# Patient Record
Sex: Male | Born: 1955 | Race: White | Hispanic: No
Health system: Western US, Academic
[De-identification: ages and names within clinical notes are randomized; demographics above are authoritative.]

## PROBLEM LIST (undated history)

## (undated) DIAGNOSIS — E119 Type 2 diabetes mellitus without complications: Secondary | ICD-10-CM

---

## 2009-01-30 ENCOUNTER — Emergency Department (HOSPITAL_COMMUNITY): Admission: EM | Admit: 2009-01-30 | Discharge: 2009-01-30 | Payer: Self-pay | Admitting: Emergency Medicine

## 2009-07-15 ENCOUNTER — Emergency Department (HOSPITAL_COMMUNITY): Admission: EM | Admit: 2009-07-15 | Discharge: 2009-07-15 | Payer: Self-pay | Admitting: Emergency Medicine

## 2009-12-15 ENCOUNTER — Emergency Department (HOSPITAL_BASED_OUTPATIENT_CLINIC_OR_DEPARTMENT_OTHER)
Admission: EM | Admit: 2009-12-15 | Discharge: 2009-12-15 | Disposition: A | Discharge status: Home / Self Care | Payer: Medicare Other | Attending: Emergency Medicine | Admitting: Emergency Medicine

## 2009-12-15 DIAGNOSIS — I10 Essential (primary) hypertension: Secondary | ICD-10-CM | POA: Insufficient documentation

## 2009-12-15 DIAGNOSIS — M25559 Pain in unspecified hip: Secondary | ICD-10-CM | POA: Insufficient documentation

## 2009-12-15 DIAGNOSIS — Z5900 Homelessness unspecified: Secondary | ICD-10-CM | POA: Insufficient documentation

## 2009-12-15 DIAGNOSIS — R042 Hemoptysis: Secondary | ICD-10-CM | POA: Insufficient documentation

## 2009-12-15 DIAGNOSIS — Z85819 Personal history of malignant neoplasm of unspecified site of lip, oral cavity, and pharynx: Secondary | ICD-10-CM | POA: Insufficient documentation

## 2009-12-15 DIAGNOSIS — R070 Pain in throat: Secondary | ICD-10-CM | POA: Insufficient documentation

## 2009-12-15 DIAGNOSIS — R5383 Other fatigue: Secondary | ICD-10-CM | POA: Insufficient documentation

## 2009-12-15 DIAGNOSIS — B1920 Unspecified viral hepatitis C without hepatic coma: Secondary | ICD-10-CM | POA: Insufficient documentation

## 2009-12-15 DIAGNOSIS — R5381 Other malaise: Secondary | ICD-10-CM | POA: Insufficient documentation

## 2009-12-21 ENCOUNTER — Ambulatory Visit (HOSPITAL_BASED_OUTPATIENT_CLINIC_OR_DEPARTMENT_OTHER): Payer: Medicaid Other | Attending: Facial Plastic Surgery | Admitting: Facial Plastic Surgery

## 2009-12-21 DIAGNOSIS — R634 Abnormal weight loss: Secondary | ICD-10-CM | POA: Insufficient documentation

## 2009-12-21 DIAGNOSIS — Z85819 Personal history of malignant neoplasm of unspecified site of lip, oral cavity, and pharynx: Secondary | ICD-10-CM | POA: Insufficient documentation

## 2009-12-25 ENCOUNTER — Ambulatory Visit (HOSPITAL_BASED_OUTPATIENT_CLINIC_OR_DEPARTMENT_OTHER): Payer: Medicaid Other | Attending: Facial Plastic Surgery

## 2009-12-25 DIAGNOSIS — Z85819 Personal history of malignant neoplasm of unspecified site of lip, oral cavity, and pharynx: Secondary | ICD-10-CM | POA: Insufficient documentation

## 2010-01-01 ENCOUNTER — Encounter (HOSPITAL_BASED_OUTPATIENT_CLINIC_OR_DEPARTMENT_OTHER): Payer: Medicaid Other | Admitting: Facial Plastic Surgery

## 2010-04-19 ENCOUNTER — Encounter (HOSPITAL_BASED_OUTPATIENT_CLINIC_OR_DEPARTMENT_OTHER): Payer: Medicaid Other | Admitting: Facial Plastic Surgery

## 2010-04-26 ENCOUNTER — Ambulatory Visit (HOSPITAL_BASED_OUTPATIENT_CLINIC_OR_DEPARTMENT_OTHER): Payer: Medicare Other | Attending: Facial Plastic Surgery | Admitting: Facial Plastic Surgery

## 2010-04-26 DIAGNOSIS — Z85819 Personal history of malignant neoplasm of unspecified site of lip, oral cavity, and pharynx: Secondary | ICD-10-CM | POA: Insufficient documentation

## 2010-04-26 DIAGNOSIS — Z483 Aftercare following surgery for neoplasm: Secondary | ICD-10-CM | POA: Insufficient documentation

## 2010-12-06 LAB — DIFFERENTIAL
Basophils Absolute: 0.1 10*3/uL (ref 0.0–0.1)
Basophils Relative: 1 % (ref 0–1)
Eosinophils Absolute: 0.2 10*3/uL (ref 0.0–0.7)
Eosinophils Relative: 2 % (ref 0–5)
Monocytes Absolute: 1.3 10*3/uL — ABNORMAL HIGH (ref 0.1–1.0)

## 2010-12-06 LAB — COMPREHENSIVE METABOLIC PANEL
ALT: 22 U/L (ref 0–53)
AST: 21 U/L (ref 0–37)
Albumin: 3.6 g/dL (ref 3.5–5.2)
Alkaline Phosphatase: 77 U/L (ref 39–117)
Chloride: 108 mEq/L (ref 96–112)
GFR calc Af Amer: >60 mL/min (ref >60)
Potassium: 3.6 mEq/L (ref 3.5–5.1)
Total Bilirubin: 0.5 mg/dL (ref 0.3–1.2)

## 2010-12-06 LAB — CBC
HCT: 42.5 % (ref 39.0–52.0)
Hemoglobin: 14.6 g/dL (ref 13.0–17.0)
WBC: 12.6 10*3/uL — ABNORMAL HIGH (ref 4.0–10.5)

## 2010-12-06 LAB — CK TOTAL AND CKMB (NOT AT ARMC): Relative Index: 0.8 (ref 0.0–2.5)

## 2016-08-11 ENCOUNTER — Other Ambulatory Visit: Payer: Self-pay | Admitting: Emergency Medicine

## 2016-08-11 ENCOUNTER — Inpatient Hospital Stay (HOSPITAL_COMMUNITY): Payer: Medicare HMO | Admitting: Vascular Neurology

## 2016-08-11 ENCOUNTER — Inpatient Hospital Stay (HOSPITAL_BASED_OUTPATIENT_CLINIC_OR_DEPARTMENT_OTHER)
Admission: EM | Admit: 2016-08-11 | Discharge: 2016-08-12 | DRG: 068 | Disposition: A | Discharge status: Home / Self Care | Payer: Medicare HMO | Source: Other Acute Inpatient Hospital | Attending: Vascular Neurology | Admitting: Vascular Neurology

## 2016-08-11 DIAGNOSIS — Z87891 Personal history of nicotine dependence: Secondary | ICD-10-CM

## 2016-08-11 DIAGNOSIS — Z85818 Personal history of malignant neoplasm of other sites of lip, oral cavity, and pharynx: Secondary | ICD-10-CM

## 2016-08-11 DIAGNOSIS — I6521 Occlusion and stenosis of right carotid artery: Secondary | ICD-10-CM

## 2016-08-11 DIAGNOSIS — R9431 Abnormal electrocardiogram [ECG] [EKG]: Secondary | ICD-10-CM

## 2016-08-11 DIAGNOSIS — H5461 Unqualified visual loss, right eye, normal vision left eye: Secondary | ICD-10-CM | POA: Diagnosis present

## 2016-08-11 DIAGNOSIS — G459 Transient cerebral ischemic attack, unspecified: Secondary | ICD-10-CM | POA: Diagnosis present

## 2016-08-11 DIAGNOSIS — R200 Anesthesia of skin: Secondary | ICD-10-CM

## 2016-08-11 DIAGNOSIS — F1290 Cannabis use, unspecified, uncomplicated: Secondary | ICD-10-CM | POA: Diagnosis present

## 2016-08-11 DIAGNOSIS — B1920 Unspecified viral hepatitis C without hepatic coma: Secondary | ICD-10-CM | POA: Diagnosis present

## 2016-08-11 DIAGNOSIS — I639 Cerebral infarction, unspecified: Secondary | ICD-10-CM

## 2016-08-11 DIAGNOSIS — E861 Hypovolemia: Secondary | ICD-10-CM | POA: Diagnosis present

## 2016-08-11 DIAGNOSIS — I10 Essential (primary) hypertension: Secondary | ICD-10-CM | POA: Diagnosis present

## 2016-08-11 DIAGNOSIS — Z923 Personal history of irradiation: Secondary | ICD-10-CM

## 2016-08-11 LAB — CBC, DIFF
% Basophils: 1 %
% Eosinophils: 3 %
% Immature Granulocytes: 0 %
% Lymphocytes: 34 %
% Monocytes: 6 %
% Neutrophils: 56 %
Absolute Eosinophil Count: 0.29 10*3/uL (ref 0.00–0.50)
Absolute Lymphocyte Count: 2.88 10*3/uL (ref 1.00–4.80)
Basophils: 0.04 10*3/uL (ref 0.00–0.20)
Hematocrit: 41 % (ref 38–50)
Hemoglobin: 14.6 g/dL (ref 13.0–18.0)
Immature Granulocytes: 0.01 10*3/uL (ref 0.00–0.05)
MCH: 30.4 pg (ref 27.3–33.6)
MCHC: 35.8 g/dL (ref 32.2–36.5)
MCV: 85 fL (ref 81–98)
Monocytes: 0.54 10*3/uL (ref 0.00–0.80)
Neutrophils: 4.74 10*3/uL (ref 1.80–7.00)
Platelet Count: 172 10*3/uL (ref 150–400)
RBC: 4.81 10*6/uL (ref 4.40–5.60)
RDW-CV: 12.4 % (ref 11.6–14.4)
WBC: 8.5 10*3/uL (ref 4.30–10.00)

## 2016-08-11 LAB — BASIC METABOLIC PANEL
Anion Gap: 8 (ref 4–12)
Calcium: 9.6 mg/dL (ref 8.9–10.2)
Carbon Dioxide, Total: 25 meq/L (ref 22–32)
Chloride: 100 meq/L (ref 98–108)
Creatinine: 1.03 mg/dL (ref 0.51–1.18)
GFR, Calc, African American: >60 mL/min/{1.73_m2} (ref >59)
GFR, Calc, European American: >60 mL/min/{1.73_m2} (ref >59)
Glucose: 89 mg/dL (ref 62–125)
Potassium: 4 meq/L (ref 3.6–5.2)
Sodium: 133 meq/L — ABNORMAL LOW (ref 135–145)
Urea Nitrogen: 11 mg/dL (ref 8–21)

## 2016-08-11 LAB — PROTHROMBIN & PTT
Partial Thromboplastin Time: 35 s (ref 22–35)
Prothrombin INR: 1.2 (ref 0.8–1.3)
Prothrombin Time Patient: 15.1 s (ref 10.7–15.6)

## 2016-08-12 ENCOUNTER — Other Ambulatory Visit (HOSPITAL_BASED_OUTPATIENT_CLINIC_OR_DEPARTMENT_OTHER): Payer: Self-pay | Admitting: Vascular Neurology

## 2016-08-12 ENCOUNTER — Other Ambulatory Visit (HOSPITAL_BASED_OUTPATIENT_CLINIC_OR_DEPARTMENT_OTHER): Payer: Self-pay | Admitting: Neurology

## 2016-08-12 DIAGNOSIS — I499 Cardiac arrhythmia, unspecified: Secondary | ICD-10-CM

## 2016-08-12 DIAGNOSIS — G453 Amaurosis fugax: Secondary | ICD-10-CM

## 2016-08-12 LAB — CBC (HEMOGRAM)
Hematocrit: 46 % (ref 38–50)
Hemoglobin: 16.2 g/dL (ref 13.0–18.0)
MCH: 30.5 pg (ref 27.3–33.6)
MCHC: 35.3 g/dL (ref 32.2–36.5)
MCV: 86 fL (ref 81–98)
Platelet Count: 216 10*3/uL (ref 150–400)
RBC: 5.31 10*6/uL (ref 4.40–5.60)
RDW-CV: 12.6 % (ref 11.6–14.4)
WBC: 8.32 10*3/uL (ref 4.30–10.00)

## 2016-08-12 LAB — LIPID PANEL
Cholesterol (LDL): 83 mg/dL (ref <130)
Cholesterol/HDL Ratio: 2.3
HDL Cholesterol: 74 mg/dL (ref >39)
Non-HDL Cholesterol: 99 mg/dL (ref 0–159)
Total Cholesterol: 173 mg/dL (ref <200)
Triglyceride: 78 mg/dL (ref <150)

## 2016-08-12 LAB — BASIC METABOLIC PANEL
Anion Gap: 3 — ABNORMAL LOW (ref 4–12)
Calcium: 10 mg/dL (ref 8.9–10.2)
Carbon Dioxide, Total: 29 meq/L (ref 22–32)
Chloride: 100 meq/L (ref 98–108)
Creatinine: 1.16 mg/dL (ref 0.51–1.18)
GFR, Calc, African American: >60 mL/min/{1.73_m2} (ref >59)
GFR, Calc, European American: >60 mL/min/{1.73_m2} (ref >59)
Glucose: 110 mg/dL (ref 62–125)
Potassium: 4.5 meq/L (ref 3.6–5.2)
Sodium: 132 meq/L — ABNORMAL LOW (ref 135–145)
Urea Nitrogen: 13 mg/dL (ref 8–21)

## 2016-08-16 ENCOUNTER — Ambulatory Visit (HOSPITAL_BASED_OUTPATIENT_CLINIC_OR_DEPARTMENT_OTHER): Payer: Medicare HMO | Attending: Interventional Cardiology

## 2016-08-16 DIAGNOSIS — I499 Cardiac arrhythmia, unspecified: Secondary | ICD-10-CM

## 2016-08-17 ENCOUNTER — Other Ambulatory Visit (HOSPITAL_BASED_OUTPATIENT_CLINIC_OR_DEPARTMENT_OTHER): Payer: Self-pay | Admitting: Vascular Neurology

## 2016-08-17 DIAGNOSIS — I10 Essential (primary) hypertension: Secondary | ICD-10-CM

## 2016-08-17 MED ORDER — AMLODIPINE BESYLATE 5 MG OR TABS
5.0000 mg | ORAL_TABLET | Freq: Every day | ORAL | 0 refills | Status: AC
Start: 2016-08-17 — End: ?

## 2016-08-17 NOTE — Progress Notes (Signed)
Pt called w/ SBP log - most readings 140s-160s. He has been compliant with lisinopril 20 BID. He denies new focal neurological symptoms. We will start amlodipine 5 daily (script sent to pharmacy) and continue to monitor and log BP daily.

## 2016-09-19 ENCOUNTER — Telehealth (HOSPITAL_BASED_OUTPATIENT_CLINIC_OR_DEPARTMENT_OTHER): Payer: Self-pay | Admitting: Vascular Neurology

## 2016-09-19 ENCOUNTER — Encounter (HOSPITAL_BASED_OUTPATIENT_CLINIC_OR_DEPARTMENT_OTHER): Payer: Self-pay | Admitting: Vascular Neurology
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
## 2016-12-14 ENCOUNTER — Telehealth (HOSPITAL_BASED_OUTPATIENT_CLINIC_OR_DEPARTMENT_OTHER): Payer: Self-pay | Admitting: Vascular Neurology

## 2016-12-14 NOTE — Telephone Encounter (Signed)
30 day monitor results are in EPIC under [employer] information officer.

## 2016-12-14 NOTE — Telephone Encounter (Signed)
(  TEXTING IS AN OPTION FOR [facility] CLIN[facility] ONLY)  Is this a [facility] clinic? No      RETURN CALL: Detailed message on voicemail only      SUBJECT:  Results Request     TEST ORDERED BY: Dr. Lars Masson  TYPE OF TEST? Other Heart monitor  LOCATION OF TEST: [facility]  DATE OF TEST: Whole month of January 2018  ADDITIONAL INFORMATION: Patient has not heard back about his event recorder for the month of January. Thank you.

## 2016-12-14 NOTE — Telephone Encounter (Signed)
Patient advised that heart monitor done in January showed sinus rhythm which is a normal rhythm.

## 2017-11-07 ENCOUNTER — Encounter: Admit: 2017-11-07 | Discharge: 2017-11-07 | Payer: BC Managed Care – PPO

## 2017-11-07 DIAGNOSIS — K6389 Other specified diseases of intestine: ICD-10-CM

## 2017-11-07 DIAGNOSIS — D3A098 Benign carcinoid tumors of other sites: ICD-10-CM

## 2017-11-07 DIAGNOSIS — D3A00 Benign carcinoid tumor of unspecified site: Principal | ICD-10-CM

## 2017-11-08 LAB — CBC AND DIFF
Lab: 0.5 %
Lab: 224 {cells}/uL (ref 850–3900)
Lab: 234 {cells}/uL (ref 15–500)
Lab: 28 % (ref 10–35)
Lab: 3 %
Lab: 39 {cells}/uL (ref 0–200)
Lab: 455 {cells}/uL (ref 1500–7800)
Lab: 58 % (ref 40–115)
Lab: 725 {cells}/uL (ref 200–950)
Lab: 9.3 % (ref 9–46)
Lab: 9.4 fL (ref 7.5–12.5)

## 2017-11-09 ENCOUNTER — Encounter: Admit: 2017-11-09 | Discharge: 2017-11-10 | Payer: BC Managed Care – PPO

## 2017-11-10 ENCOUNTER — Encounter: Admit: 2017-11-10 | Discharge: 2017-11-10 | Payer: BC Managed Care – PPO

## 2017-11-10 DIAGNOSIS — K6389 Other specified diseases of intestine: ICD-10-CM

## 2017-11-10 DIAGNOSIS — D3A098 Benign carcinoid tumors of other sites: Principal | ICD-10-CM

## 2017-11-15 ENCOUNTER — Encounter: Admit: 2017-11-15 | Discharge: 2017-11-15 | Payer: BC Managed Care – PPO

## 2017-11-15 DIAGNOSIS — K6389 Other specified diseases of intestine: ICD-10-CM

## 2017-11-15 DIAGNOSIS — D3A098 Benign carcinoid tumors of other sites: Principal | ICD-10-CM

## 2017-11-23 ENCOUNTER — Encounter: Admit: 2017-11-23 | Discharge: 2017-11-23 | Payer: BC Managed Care – PPO

## 2017-11-23 DIAGNOSIS — C179 Malignant neoplasm of small intestine, unspecified: ICD-10-CM

## 2017-11-23 DIAGNOSIS — K56609 Unspecified intestinal obstruction, unspecified as to partial versus complete obstruction: ICD-10-CM

## 2017-11-23 DIAGNOSIS — K219 Gastro-esophageal reflux disease without esophagitis: ICD-10-CM

## 2017-11-23 DIAGNOSIS — K589 Irritable bowel syndrome without diarrhea: ICD-10-CM

## 2017-11-23 DIAGNOSIS — Z9889 Other specified postprocedural states: ICD-10-CM

## 2017-11-23 DIAGNOSIS — E785 Hyperlipidemia, unspecified: Principal | ICD-10-CM

## 2017-11-23 DIAGNOSIS — D3A098 Benign carcinoid tumors of other sites: Principal | ICD-10-CM

## 2017-11-29 ENCOUNTER — Encounter: Admit: 2017-11-29 | Discharge: 2017-11-29 | Payer: BC Managed Care – PPO

## 2017-11-29 DIAGNOSIS — D3A00 Benign carcinoid tumor of unspecified site: Principal | ICD-10-CM

## 2017-12-05 ENCOUNTER — Encounter: Admit: 2017-12-05 | Discharge: 2017-12-05 | Payer: BC Managed Care – PPO

## 2017-12-05 DIAGNOSIS — K589 Irritable bowel syndrome without diarrhea: ICD-10-CM

## 2017-12-05 DIAGNOSIS — E785 Hyperlipidemia, unspecified: Principal | ICD-10-CM

## 2017-12-05 DIAGNOSIS — K56609 Unspecified intestinal obstruction, unspecified as to partial versus complete obstruction: ICD-10-CM

## 2017-12-05 DIAGNOSIS — K219 Gastro-esophageal reflux disease without esophagitis: ICD-10-CM

## 2017-12-05 DIAGNOSIS — C179 Malignant neoplasm of small intestine, unspecified: ICD-10-CM

## 2017-12-20 ENCOUNTER — Ambulatory Visit: Admit: 2017-12-20 | Discharge: 2017-12-20 | Payer: BC Managed Care – PPO

## 2017-12-20 ENCOUNTER — Encounter: Admit: 2017-12-20 | Discharge: 2017-12-20 | Payer: BC Managed Care – PPO

## 2017-12-20 DIAGNOSIS — K589 Irritable bowel syndrome without diarrhea: ICD-10-CM

## 2017-12-20 DIAGNOSIS — K432 Incisional hernia without obstruction or gangrene: Principal | ICD-10-CM

## 2017-12-20 DIAGNOSIS — C179 Malignant neoplasm of small intestine, unspecified: ICD-10-CM

## 2017-12-20 DIAGNOSIS — E785 Hyperlipidemia, unspecified: Principal | ICD-10-CM

## 2017-12-20 DIAGNOSIS — K219 Gastro-esophageal reflux disease without esophagitis: ICD-10-CM

## 2017-12-20 DIAGNOSIS — K56609 Unspecified intestinal obstruction, unspecified as to partial versus complete obstruction: ICD-10-CM

## 2017-12-23 ENCOUNTER — Encounter: Admit: 2017-12-23 | Discharge: 2017-12-23 | Payer: BC Managed Care – PPO

## 2018-08-20 ENCOUNTER — Emergency Department (HOSPITAL_COMMUNITY): Payer: PRIVATE HEALTH INSURANCE

## 2018-08-20 ENCOUNTER — Emergency Department (HOSPITAL_COMMUNITY)
Admission: EM | Admit: 2018-08-20 | Discharge: 2018-08-20 | Disposition: A | Discharge status: Home / Self Care | Payer: PRIVATE HEALTH INSURANCE | Attending: Emergency Medicine | Admitting: Emergency Medicine

## 2018-08-20 ENCOUNTER — Encounter (HOSPITAL_COMMUNITY): Payer: Self-pay | Admitting: Emergency Medicine

## 2018-08-20 ENCOUNTER — Inpatient Hospital Stay: Payer: Self-pay

## 2018-08-20 DIAGNOSIS — M546 Pain in thoracic spine: Secondary | ICD-10-CM | POA: Insufficient documentation

## 2018-08-20 DIAGNOSIS — K029 Dental caries, unspecified: Secondary | ICD-10-CM | POA: Diagnosis not present

## 2018-08-20 DIAGNOSIS — R51 Headache: Secondary | ICD-10-CM | POA: Diagnosis not present

## 2018-08-20 DIAGNOSIS — R739 Hyperglycemia, unspecified: Secondary | ICD-10-CM | POA: Diagnosis not present

## 2018-08-20 DIAGNOSIS — K08119 Complete loss of teeth due to trauma, unspecified class: Secondary | ICD-10-CM | POA: Diagnosis not present

## 2018-08-20 DIAGNOSIS — R0789 Other chest pain: Secondary | ICD-10-CM | POA: Diagnosis not present

## 2018-08-20 DIAGNOSIS — I10 Essential (primary) hypertension: Secondary | ICD-10-CM | POA: Insufficient documentation

## 2018-08-20 DIAGNOSIS — M542 Cervicalgia: Secondary | ICD-10-CM | POA: Insufficient documentation

## 2018-08-20 LAB — COMPREHENSIVE METABOLIC PANEL
ALK PHOS: 91 U/L (ref 38–126)
ALT: 32 U/L (ref 0–44)
AST: 38 U/L (ref 15–41)
Albumin: 3.5 g/dL (ref 3.5–5.0)
Anion gap: 11 (ref 5–15)
BILIRUBIN TOTAL: 0.9 mg/dL (ref 0.3–1.2)
BUN: 9 mg/dL (ref 8–23)
CALCIUM: 8.9 mg/dL (ref 8.9–10.3)
CHLORIDE: 100 mmol/L (ref 98–111)
CO2: 23 mmol/L (ref 22–32)
Creatinine, Ser: 1.06 mg/dL (ref 0.61–1.24)
Glucose, Bld: 398 mg/dL — ABNORMAL HIGH (ref 70–99)
Potassium: 3.8 mmol/L (ref 3.5–5.1)
Sodium: 134 mmol/L — ABNORMAL LOW (ref 135–145)
Total Protein: 6.6 g/dL (ref 6.5–8.1)

## 2018-08-20 LAB — URINALYSIS, MICROSCOPIC (REFLEX)
Bacteria, UA: NONE SEEN
Squamous Epithelial / HPF: NONE SEEN (ref 0–5)
WBC, UA: NONE SEEN WBC/hpf (ref 0–5)

## 2018-08-20 LAB — I-STAT TROPONIN, ED
TROPONIN I, POC: 0.01 ng/mL (ref 0.00–0.08)
Troponin i, poc: 0.01 ng/mL (ref 0.00–0.08)

## 2018-08-20 LAB — URINALYSIS, ROUTINE W REFLEX MICROSCOPIC
Bilirubin Urine: NEGATIVE
KETONES UR: 15 mg/dL — AB
LEUKOCYTES UA: NEGATIVE
Nitrite: NEGATIVE
PH: 7.5 (ref 5.0–8.0)
Protein, ur: NEGATIVE mg/dL
Specific Gravity, Urine: <1.005 — ABNORMAL LOW (ref 1.005–1.030)

## 2018-08-20 LAB — CBC
HEMATOCRIT: 47.6 % (ref 39.0–52.0)
Hemoglobin: 16.4 g/dL (ref 13.0–17.0)
MCH: 32.9 pg (ref 26.0–34.0)
MCHC: 34.5 g/dL (ref 30.0–36.0)
MCV: 95.6 fL (ref 80.0–100.0)
PLATELETS: 268 10*3/uL (ref 150–400)
RBC: 4.98 MIL/uL (ref 4.22–5.81)
RDW: 12.1 % (ref 11.5–15.5)
WBC: 14 10*3/uL — AB (ref 4.0–10.5)
nRBC: 0 % (ref 0.0–0.2)

## 2018-08-20 LAB — LIPASE, BLOOD: Lipase: 38 U/L (ref 11–51)

## 2018-08-20 LAB — CBG MONITORING, ED: Glucose-Capillary: 277 mg/dL — ABNORMAL HIGH (ref 70–99)

## 2018-08-20 MED ORDER — METFORMIN HCL 500 MG PO TABS
500.0000 mg | ORAL_TABLET | Freq: Once | ORAL | Status: AC
  Administered 2018-08-20: 500 mg via ORAL
  Filled 2018-08-20: qty 1

## 2018-08-20 MED ORDER — SODIUM CHLORIDE 0.9 % IV BOLUS
1000.0000 mL | Freq: Once | INTRAVENOUS | Status: AC
Start: 2018-08-20 — End: 2018-08-20
  Administered 2018-08-20: 1000 mL via INTRAVENOUS

## 2018-08-20 MED ORDER — NAPROXEN 250 MG PO TABS
500.0000 mg | ORAL_TABLET | Freq: Once | ORAL | Status: AC
  Administered 2018-08-20: 500 mg via ORAL
  Filled 2018-08-20: qty 2

## 2018-08-20 MED ORDER — HYDROCHLOROTHIAZIDE 25 MG PO TABS: 25.0000 mg | ORAL_TABLET | Freq: Every day | ORAL | 0 refills | Status: DC

## 2018-08-20 MED ORDER — AZITHROMYCIN 250 MG PO TABS: 500.0000 mg | ORAL_TABLET | Freq: Once | ORAL | Status: DC

## 2018-08-20 MED ORDER — AMOXICILLIN 500 MG PO CAPS: 1000.0000 mg | ORAL_CAPSULE | Freq: Once | ORAL | Status: DC

## 2018-08-20 MED ORDER — METHOCARBAMOL 500 MG PO TABS
1000.0000 mg | ORAL_TABLET | Freq: Once | ORAL | Status: AC
  Administered 2018-08-20: 1000 mg via ORAL
  Filled 2018-08-20: qty 2

## 2018-08-20 MED ORDER — NAPROXEN 500 MG PO TABS: 500.0000 mg | ORAL_TABLET | Freq: Two times a day (BID) | ORAL | 0 refills | Status: DC

## 2018-08-20 MED ORDER — PENICILLIN V POTASSIUM 250 MG PO TABS
500.0000 mg | ORAL_TABLET | Freq: Once | ORAL | Status: AC
  Administered 2018-08-20: 500 mg via ORAL
  Filled 2018-08-20: qty 2

## 2018-08-20 MED ORDER — METHOCARBAMOL 500 MG PO TABS: 500.0000 mg | ORAL_TABLET | Freq: Two times a day (BID) | ORAL | 0 refills | Status: DC

## 2018-08-20 MED ORDER — IOHEXOL 300 MG/ML  SOLN
100.0000 mL | Freq: Once | INTRAMUSCULAR | Status: AC | PRN
  Administered 2018-08-20: 100 mL via INTRAVENOUS

## 2018-08-20 MED ORDER — METFORMIN HCL 500 MG PO TABS: 500.0000 mg | ORAL_TABLET | Freq: Two times a day (BID) | ORAL | 0 refills | Status: DC

## 2018-08-20 MED ORDER — HYDROCHLOROTHIAZIDE 25 MG PO TABS
25.0000 mg | ORAL_TABLET | Freq: Every day | ORAL | Status: DC
  Administered 2018-08-20: 25 mg via ORAL
  Filled 2018-08-20: qty 1
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
## 2018-08-20 NOTE — ED Notes (Signed)
ED Provider at bedside. 
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
## 2018-08-20 NOTE — Discharge Instructions (Signed)
The pain you are experiencing is likely due to muscle strain and soft tissue injury, your imaging showed no evidence of fracture or more serious injury, you may take Naprosyn and Robaxin as needed for pain management. Do not combine with any pain reliever other than tylenol.  You may also use ice and heat, and over-the-counter remedies such as Biofreeze gel or salon pas lidocaine patches. The muscle soreness should improve over the next week. Follow up with your family doctor in the next week for a recheck if you are still having symptoms. Return to ED if pain is worsening, you develop weakness or numbness of extremities, or new or concerning symptoms develop.  Your blood sugar was significantly elevated here today and this is concerning for type 2 diabetes.  I would like for you to begin taking metformin twice daily with food, this can cause some upset stomach.  I would also like for you to begin taking HCTZ once daily in the morning for your blood pressure as your blood pressure was very elevated here today, and I am concerned you are likely developed chronic hypertension, if this goes unmanaged it puts you at risk for heart problems, stroke and kidney damage.   It is extremely important that you call to establish care with a primary care doctor for continued management of your blood sugar and high blood pressure.

## 2018-08-24 ENCOUNTER — Encounter (HOSPITAL_COMMUNITY): Payer: Self-pay | Admitting: *Deleted

## 2018-08-24 ENCOUNTER — Emergency Department (HOSPITAL_COMMUNITY): Payer: PRIVATE HEALTH INSURANCE

## 2018-08-24 ENCOUNTER — Emergency Department (HOSPITAL_COMMUNITY)
Admission: EM | Admit: 2018-08-24 | Discharge: 2018-08-24 | Disposition: A | Discharge status: Home / Self Care | Payer: PRIVATE HEALTH INSURANCE | Attending: Emergency Medicine | Admitting: Emergency Medicine

## 2018-08-24 DIAGNOSIS — S161XXD Strain of muscle, fascia and tendon at neck level, subsequent encounter: Secondary | ICD-10-CM | POA: Insufficient documentation

## 2018-08-24 DIAGNOSIS — R0789 Other chest pain: Secondary | ICD-10-CM | POA: Insufficient documentation

## 2018-08-24 DIAGNOSIS — M542 Cervicalgia: Secondary | ICD-10-CM | POA: Diagnosis present

## 2018-08-24 DIAGNOSIS — S161XXA Strain of muscle, fascia and tendon at neck level, initial encounter: Secondary | ICD-10-CM

## 2018-08-24 LAB — I-STAT CHEM 8, ED
BUN: 17 mg/dL (ref 8–23)
Calcium, Ion: 1.09 mmol/L — ABNORMAL LOW (ref 1.15–1.40)
Chloride: 97 mmol/L — ABNORMAL LOW (ref 98–111)
Creatinine, Ser: 0.7 mg/dL (ref 0.61–1.24)
Glucose, Bld: 390 mg/dL — ABNORMAL HIGH (ref 70–99)
HCT: 50 % (ref 39.0–52.0)
HEMOGLOBIN: 17 g/dL (ref 13.0–17.0)
Potassium: 3.3 mmol/L — ABNORMAL LOW (ref 3.5–5.1)
Sodium: 135 mmol/L (ref 135–145)
TCO2: 26 mmol/L (ref 22–32)

## 2018-08-24 MED ORDER — IOPAMIDOL (ISOVUE-370) INJECTION 76%
INTRAVENOUS | Status: AC
  Filled 2018-08-24: qty 50

## 2018-08-24 MED ORDER — ACETAMINOPHEN 500 MG PO TABS
1000.0000 mg | ORAL_TABLET | Freq: Once | ORAL | Status: AC
  Administered 2018-08-24: 1000 mg via ORAL
  Filled 2018-08-24: qty 2

## 2018-08-24 MED ORDER — KETOROLAC TROMETHAMINE 60 MG/2ML IM SOLN
15.0000 mg | Freq: Once | INTRAMUSCULAR | Status: AC
  Administered 2018-08-24: 15 mg via INTRAMUSCULAR
  Filled 2018-08-24: qty 2

## 2018-08-24 MED ORDER — IOPAMIDOL (ISOVUE-370) INJECTION 76%
75.0000 mL | Freq: Once | INTRAVENOUS | Status: AC | PRN
  Administered 2018-08-24: 100 mL via INTRAVENOUS

## 2018-08-24 NOTE — Discharge Instructions (Signed)

## 2018-08-24 NOTE — ED Notes (Signed)
Pt verbalized understanding of discharge paperwork and follow-up care.  °
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
## 2018-10-11 ENCOUNTER — Encounter (HOSPITAL_BASED_OUTPATIENT_CLINIC_OR_DEPARTMENT_OTHER): Payer: BLUE CROSS/BLUE SHIELD | Admitting: Vascular Neurology

## 2018-10-29 ENCOUNTER — Encounter: Admit: 2018-10-29 | Discharge: 2018-10-29 | Payer: BC Managed Care – PPO

## 2018-11-02 LAB — CBC AND DIFF
Lab: 0.3 %
Lab: 2 %
Lab: 26 % (ref 9–46)
Lab: 63 % (ref 10–35)
Lab: 8.1 %

## 2018-11-09 ENCOUNTER — Encounter: Admit: 2018-11-09 | Discharge: 2018-11-09 | Payer: BC Managed Care – PPO

## 2018-11-09 DIAGNOSIS — D3A098 Benign carcinoid tumors of other sites: Principal | ICD-10-CM

## 2018-11-13 ENCOUNTER — Encounter: Admit: 2018-11-13 | Discharge: 2018-11-13 | Payer: BC Managed Care – PPO

## 2018-11-13 DIAGNOSIS — D3A098 Benign carcinoid tumors of other sites: Principal | ICD-10-CM

## 2018-11-28 ENCOUNTER — Encounter: Admit: 2018-11-28 | Discharge: 2018-11-28 | Payer: BC Managed Care – PPO

## 2019-01-14 ENCOUNTER — Encounter: Admit: 2019-01-14 | Discharge: 2019-01-14 | Payer: BC Managed Care – PPO

## 2019-01-17 ENCOUNTER — Encounter: Admit: 2019-01-17 | Discharge: 2019-01-17 | Payer: BC Managed Care – PPO

## 2019-01-17 DIAGNOSIS — L989 Disorder of the skin and subcutaneous tissue, unspecified: ICD-10-CM

## 2019-01-17 DIAGNOSIS — E785 Hyperlipidemia, unspecified: Principal | ICD-10-CM

## 2019-01-17 DIAGNOSIS — D3A098 Benign carcinoid tumors of other sites: Principal | ICD-10-CM

## 2019-01-17 DIAGNOSIS — C179 Malignant neoplasm of small intestine, unspecified: ICD-10-CM

## 2019-01-17 DIAGNOSIS — K56609 Unspecified intestinal obstruction, unspecified as to partial versus complete obstruction: ICD-10-CM

## 2019-01-17 DIAGNOSIS — K589 Irritable bowel syndrome without diarrhea: ICD-10-CM

## 2019-01-17 DIAGNOSIS — K219 Gastro-esophageal reflux disease without esophagitis: ICD-10-CM

## 2019-01-17 NOTE — Progress Notes
Name: Louis [name]          MRN: [id number]      DOB: 06-Apr-1956      AGE: 63 y.o.   DATE OF SERVICE: 01/17/2019    Subjective:             Reason for Visit: Annual follow-up of small bowel carcinoid in remission  Cancer      Louis [name] is a 63 y.o. male.         History of Present Illness   01/17/2019  Louis [name] is seen today in follow-up of his small bowel carcinoid which remains in remission.  His wife is on the phone rather than here in person due to the visitor restrictions to do COVID-19.  He and his wife have been remaining safe.  They have both been on a keto diet and he was able to lose some weight but did regain some weight during the stay at home restrictions with the COVID-19 pandemic.  He is feeling quite well.  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    His lab work from 10/29/2018 looked very excellent and CT chest abdomen pelvis and octreotide scan were negative in March 2020.  Does still have a small ventral hernia with some loops of small bowel in it but no incarceration.  This does bother him on occasion and he is considering surgery perhaps in the fall.  Incidentally he has some gallstones which are asymptomatic.    Has a small lesion at the right temple region that he sometimes picks at but has been recurring over the last 3 months.  May just be a seborrheic keratosis but it could be an early basal cell skin cancer so I will arrange for dermatology consultation.        11/23/17: Louis [name] is seen today in follow-up of his small bowel carcinoid which remains in remission.  11/07/17 lab work remains normal.  11/14/17 CT chest abdomen pelvis and octreotide scan at [facility] all benign.  His ventral hernia bothers him at times when he has a cough.  Otherwise really no problems.      11/24/16:Louis [name] is seen along with his wife in follow-up of his small bowel carcinoid which is in remission.    Reviewed his lab work from 11/14/16 which is normal.  He had a CBC CMP and ??? simvastatin (ZOCOR) 40 mg tablet Take 40 mg by mouth at bedtime daily.   ??? tamsulosin (FLOMAX) 0.4 mg capsule Take 0.4 mg by mouth daily.   ??? vitamins, multiple cap Take 1 Cap by mouth daily.     Vitals:    01/17/19 0928   BP: 116/76   BP Source: Arm, Right Upper   Patient Position: Sitting   Pulse: 69   Resp: 18   Temp: 36.6 ???C (97.8 ???F)   TempSrc: Oral   SpO2: 98%   Weight: 86.5 kg (190 lb 12.8 oz)   Height: 176.3 cm (69.41)   PainSc: Zero     Body mass index is 27.84 kg/m???.     Pain Score: Zero     RADIOLOGY: See results of CT chest abdomen pelvis and octreotide scan results from 11/10/2016 at [facility].    Pain Addressed:  N/A    Patient Evaluated for a Clinical Trial: Patient not eligible for a treatment trial (including not needing treatment, needs palliative care, in remission).     [place] Cooperative Oncology Group performance status is 0,  Fully active, able to carry on all pre-disease performance without restriction.Marland Kitchen     Physical Exam     A pleasant conversant Caucasian male in no distress.  HEENT: Lesion right temple only a few millimeters in size could be a seborrheic keratosis or an early basal cell skin cancer.  Abdomen: Soft and nontender.  His left upper abdominal hernia is easily reduced.  Neuro: Alert and oriented ???3.     Assessment and Plan:  History of small-bowel carcinoid status post initial surgical resection Jan 22, 2013, with persistent abnormality and octreotide scan consistent with mesenteric mass/lymph node with resection by Dr. Rainey Pines December, 2014, revealing carcinoid. His followup CT scans and octreotide scans have been normal.  He is asymptomatic. There is no evidence of disease on his current CAT scan or  octreotide scan  10/2018. He sees Dr. Rainey Pines for surgical followup via telehealth visit in one week. I will plan to see him in followup in one year with lab work, CT scan, and octreotide scan.  At that point likely will discontinue the follow-up scans.  ???  ???

## 2019-01-23 ENCOUNTER — Encounter: Admit: 2019-01-23 | Discharge: 2019-01-23 | Payer: BC Managed Care – PPO

## 2019-01-23 DIAGNOSIS — K56609 Unspecified intestinal obstruction, unspecified as to partial versus complete obstruction: ICD-10-CM

## 2019-01-23 DIAGNOSIS — K219 Gastro-esophageal reflux disease without esophagitis: ICD-10-CM

## 2019-01-23 DIAGNOSIS — C179 Malignant neoplasm of small intestine, unspecified: ICD-10-CM

## 2019-01-23 DIAGNOSIS — K589 Irritable bowel syndrome without diarrhea: ICD-10-CM

## 2019-01-23 DIAGNOSIS — E785 Hyperlipidemia, unspecified: Principal | ICD-10-CM

## 2019-01-23 DIAGNOSIS — D3A098 Benign carcinoid tumors of other sites: Principal | ICD-10-CM

## 2019-01-23 NOTE — Progress Notes
Mr. Trettel continues to do well with no clear clinical evidence of cancer recurrence. He will return to clinic in 1 year for annual surveillance. The rationale for this approach was discussed with the patient, who understood and seemed comfortable with the plan and recommendations.  I answered all questions to the patient's satisfaction.      Staff name:  Dia Crawford, MD Date:  01/28/2019

## 2019-01-28 ENCOUNTER — Encounter: Admit: 2019-01-28 | Discharge: 2019-01-28

## 2019-01-28 DIAGNOSIS — K589 Irritable bowel syndrome without diarrhea: Secondary | ICD-10-CM

## 2019-01-28 DIAGNOSIS — E785 Hyperlipidemia, unspecified: Secondary | ICD-10-CM

## 2019-01-28 DIAGNOSIS — K56609 Unspecified intestinal obstruction, unspecified as to partial versus complete obstruction: Secondary | ICD-10-CM

## 2019-01-28 DIAGNOSIS — C179 Malignant neoplasm of small intestine, unspecified: Secondary | ICD-10-CM

## 2019-01-28 DIAGNOSIS — K219 Gastro-esophageal reflux disease without esophagitis: Secondary | ICD-10-CM

## 2019-02-07 ENCOUNTER — Inpatient Hospital Stay: Payer: Self-pay

## 2019-07-22 ENCOUNTER — Encounter: Admit: 2019-07-22 | Discharge: 2019-07-23 | Payer: BC Managed Care – PPO

## 2019-07-23 DIAGNOSIS — Z20828 Contact with and (suspected) exposure to other viral communicable diseases: Secondary | ICD-10-CM

## 2019-07-24 LAB — COVID-19 (SARS-COV-2) PCR

## 2019-09-01 ENCOUNTER — Inpatient Hospital Stay: Payer: Self-pay

## 2019-09-08 ENCOUNTER — Inpatient Hospital Stay: Payer: Self-pay

## 2020-01-01 ENCOUNTER — Encounter: Admit: 2020-01-01 | Discharge: 2020-01-01 | Payer: BC Managed Care – PPO

## 2020-01-01 DIAGNOSIS — D3A098 Benign carcinoid tumors of other sites: Principal | ICD-10-CM

## 2020-01-09 ENCOUNTER — Encounter: Admit: 2020-01-09 | Discharge: 2020-01-09 | Payer: BC Managed Care – PPO

## 2020-01-09 DIAGNOSIS — K219 Gastro-esophageal reflux disease without esophagitis: Secondary | ICD-10-CM

## 2020-01-09 DIAGNOSIS — D3A098 Benign carcinoid tumors of other sites: Secondary | ICD-10-CM

## 2020-01-09 DIAGNOSIS — C179 Malignant neoplasm of small intestine, unspecified: Secondary | ICD-10-CM

## 2020-01-09 DIAGNOSIS — K589 Irritable bowel syndrome without diarrhea: Secondary | ICD-10-CM

## 2020-01-09 DIAGNOSIS — K56609 Unspecified intestinal obstruction, unspecified as to partial versus complete obstruction: Secondary | ICD-10-CM

## 2020-01-09 DIAGNOSIS — E785 Hyperlipidemia, unspecified: Secondary | ICD-10-CM

## 2020-01-09 DIAGNOSIS — L989 Disorder of the skin and subcutaneous tissue, unspecified: Secondary | ICD-10-CM

## 2020-01-22 ENCOUNTER — Encounter: Admit: 2020-01-22 | Discharge: 2020-01-22 | Payer: BC Managed Care – PPO

## 2020-01-22 DIAGNOSIS — K56609 Unspecified intestinal obstruction, unspecified as to partial versus complete obstruction: Secondary | ICD-10-CM

## 2020-01-22 DIAGNOSIS — K219 Gastro-esophageal reflux disease without esophagitis: Secondary | ICD-10-CM

## 2020-01-22 DIAGNOSIS — E785 Hyperlipidemia, unspecified: Secondary | ICD-10-CM

## 2020-01-22 DIAGNOSIS — Z1211 Encounter for screening for malignant neoplasm of colon: Secondary | ICD-10-CM

## 2020-01-22 DIAGNOSIS — D3A098 Benign carcinoid tumors of other sites: Secondary | ICD-10-CM

## 2020-01-22 DIAGNOSIS — C179 Malignant neoplasm of small intestine, unspecified: Secondary | ICD-10-CM

## 2020-01-22 DIAGNOSIS — K589 Irritable bowel syndrome without diarrhea: Secondary | ICD-10-CM

## 2020-01-23 ENCOUNTER — Encounter: Admit: 2020-01-23 | Discharge: 2020-01-23 | Payer: BC Managed Care – PPO

## 2020-01-23 DIAGNOSIS — K219 Gastro-esophageal reflux disease without esophagitis: Secondary | ICD-10-CM

## 2020-01-23 DIAGNOSIS — E785 Hyperlipidemia, unspecified: Secondary | ICD-10-CM

## 2020-01-23 DIAGNOSIS — K56609 Unspecified intestinal obstruction, unspecified as to partial versus complete obstruction: Secondary | ICD-10-CM

## 2020-01-23 DIAGNOSIS — K589 Irritable bowel syndrome without diarrhea: Secondary | ICD-10-CM

## 2020-01-23 DIAGNOSIS — C179 Malignant neoplasm of small intestine, unspecified: Secondary | ICD-10-CM

## 2020-01-24 ENCOUNTER — Encounter: Admit: 2020-01-24 | Discharge: 2020-01-24 | Payer: BC Managed Care – PPO

## 2020-01-24 DIAGNOSIS — D3A098 Benign carcinoid tumors of other sites: Principal | ICD-10-CM

## 2020-01-28 ENCOUNTER — Ambulatory Visit: Admit: 2020-01-28 | Discharge: 2020-01-28 | Payer: BC Managed Care – PPO

## 2020-01-28 ENCOUNTER — Encounter: Admit: 2020-01-28 | Discharge: 2020-01-28 | Payer: BC Managed Care – PPO

## 2020-01-28 DIAGNOSIS — D3A098 Benign carcinoid tumors of other sites: Secondary | ICD-10-CM

## 2020-04-17 ENCOUNTER — Encounter: Admit: 2020-04-17 | Discharge: 2020-04-17 | Payer: BC Managed Care – PPO

## 2020-04-17 DIAGNOSIS — K56609 Unspecified intestinal obstruction, unspecified as to partial versus complete obstruction: Secondary | ICD-10-CM

## 2020-04-17 DIAGNOSIS — K589 Irritable bowel syndrome without diarrhea: Secondary | ICD-10-CM

## 2020-04-17 DIAGNOSIS — C179 Malignant neoplasm of small intestine, unspecified: Secondary | ICD-10-CM

## 2020-04-17 DIAGNOSIS — E785 Hyperlipidemia, unspecified: Secondary | ICD-10-CM

## 2020-04-17 DIAGNOSIS — K219 Gastro-esophageal reflux disease without esophagitis: Secondary | ICD-10-CM

## 2020-04-22 ENCOUNTER — Encounter: Admit: 2020-04-22 | Discharge: 2020-04-22 | Payer: BC Managed Care – PPO

## 2020-04-23 ENCOUNTER — Encounter: Admit: 2020-04-23 | Discharge: 2020-04-23 | Payer: BC Managed Care – PPO

## 2020-04-23 DIAGNOSIS — C179 Malignant neoplasm of small intestine, unspecified: Secondary | ICD-10-CM

## 2020-04-23 DIAGNOSIS — K56609 Unspecified intestinal obstruction, unspecified as to partial versus complete obstruction: Secondary | ICD-10-CM

## 2020-04-23 DIAGNOSIS — K219 Gastro-esophageal reflux disease without esophagitis: Secondary | ICD-10-CM

## 2020-04-23 DIAGNOSIS — E785 Hyperlipidemia, unspecified: Secondary | ICD-10-CM

## 2020-04-23 DIAGNOSIS — K589 Irritable bowel syndrome without diarrhea: Secondary | ICD-10-CM

## 2020-04-23 MED ORDER — PROPOFOL 10 MG/ML IV EMUL 50 ML (INFUSION)(AM)(OR)
INTRAVENOUS | 0 refills | Status: DC
Start: 2020-04-23 — End: 2020-04-23

## 2020-04-24 ENCOUNTER — Encounter: Admit: 2020-04-24 | Discharge: 2020-04-24 | Payer: BC Managed Care – PPO

## 2020-04-24 DIAGNOSIS — C179 Malignant neoplasm of small intestine, unspecified: Secondary | ICD-10-CM

## 2020-04-24 DIAGNOSIS — E785 Hyperlipidemia, unspecified: Secondary | ICD-10-CM

## 2020-04-24 DIAGNOSIS — K56609 Unspecified intestinal obstruction, unspecified as to partial versus complete obstruction: Secondary | ICD-10-CM

## 2020-04-24 DIAGNOSIS — K219 Gastro-esophageal reflux disease without esophagitis: Secondary | ICD-10-CM

## 2020-04-24 DIAGNOSIS — K589 Irritable bowel syndrome without diarrhea: Secondary | ICD-10-CM

## 2020-04-27 ENCOUNTER — Encounter: Admit: 2020-04-27 | Discharge: 2020-04-27 | Payer: BC Managed Care – PPO

## 2020-10-05 ENCOUNTER — Telehealth (INDEPENDENT_AMBULATORY_CARE_PROVIDER_SITE_OTHER): Payer: Self-pay | Admitting: Orthopaedic Surgery

## 2020-10-05 NOTE — Telephone Encounter (Signed)
RETURN CALL: Voicemail - Detailed Message      SUBJECT:  General Message     MESSAGE: Patient interested in a hip surgery consultation, but specifically wants to know if we do the newer, outpatient, 1 day, general anesthesia hip replacements that he has read about, as opposed to the full surgery older style. He already had a consultation at [facility], but they do not do the outpatient style.

## 2020-10-06 NOTE — Telephone Encounter (Signed)
Called and spoke to patient to confirm that we perform outpatient surgery he inquired about but per Beacher May, RN, not all patients are a candidate for outpatient surgery.     He will decide if he wants to be seen at this clinic and send over a referral if he does.

## 2020-11-09 ENCOUNTER — Other Ambulatory Visit: Payer: Self-pay

## 2020-11-09 ENCOUNTER — Emergency Department (HOSPITAL_COMMUNITY): Payer: Self-pay

## 2020-11-09 ENCOUNTER — Emergency Department (HOSPITAL_COMMUNITY)
Admission: EM | Admit: 2020-11-09 | Discharge: 2020-11-09 | Disposition: A | Discharge status: Home / Self Care | Payer: Self-pay | Attending: Emergency Medicine | Admitting: Emergency Medicine

## 2020-11-09 DIAGNOSIS — M542 Cervicalgia: Secondary | ICD-10-CM | POA: Diagnosis present

## 2020-11-09 DIAGNOSIS — Z7984 Long term (current) use of oral hypoglycemic drugs: Secondary | ICD-10-CM | POA: Insufficient documentation

## 2020-11-09 DIAGNOSIS — G8929 Other chronic pain: Secondary | ICD-10-CM | POA: Insufficient documentation

## 2020-11-09 IMAGING — CT CT CERVICAL SPINE W/O CM
3 of 4 series · 12 of 35 positions shown, 14 images · non-contrast
Comparison: None.

CLINICAL DATA: Left-sided neck pain radiating into head

EXAM:
CT CERVICAL SPINE WITHOUT CONTRAST
TECHNIQUE: Multidetector CT imaging of the cervical spine was performed without
intravenous contrast. Multiplanar CT image reconstructions were also
generated.

[Series 8: sag bone · sagittal · 0.42mm/px · 5 of 83 slices shown, 6 images]
[im 28/83  bone]
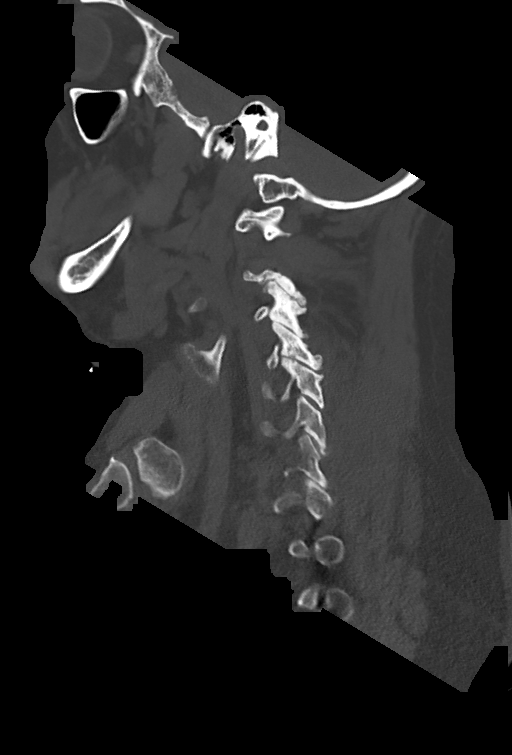
[im 35/83  bone]
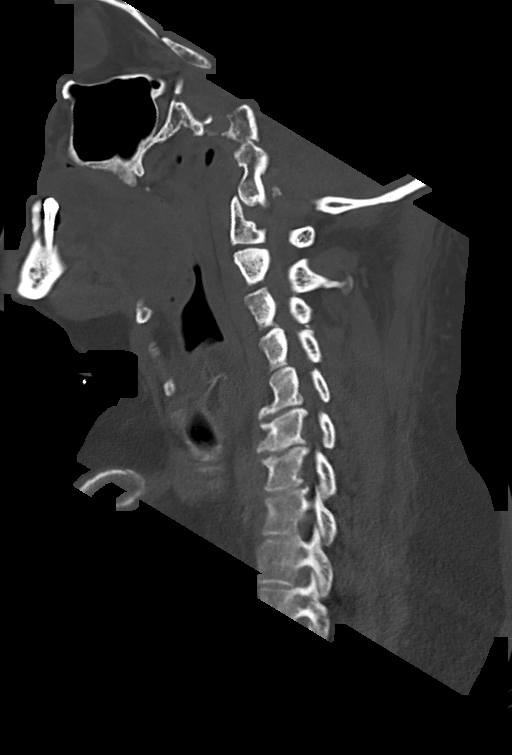
[im 42/83  soft-tissue]
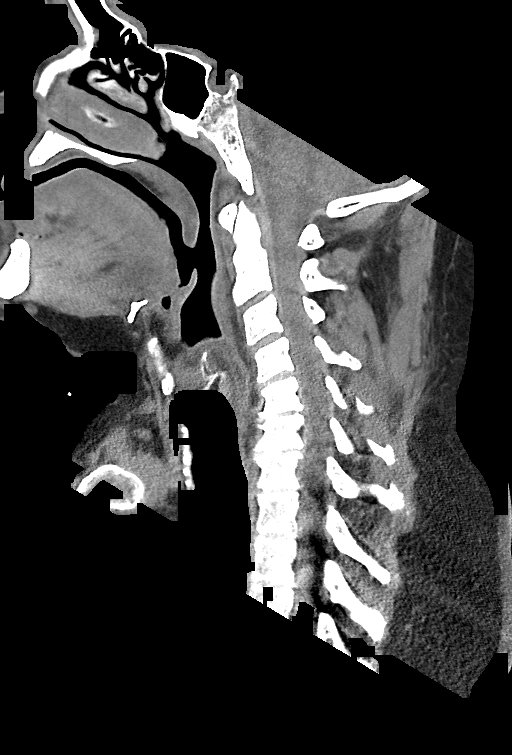
[im 42/83  bone]
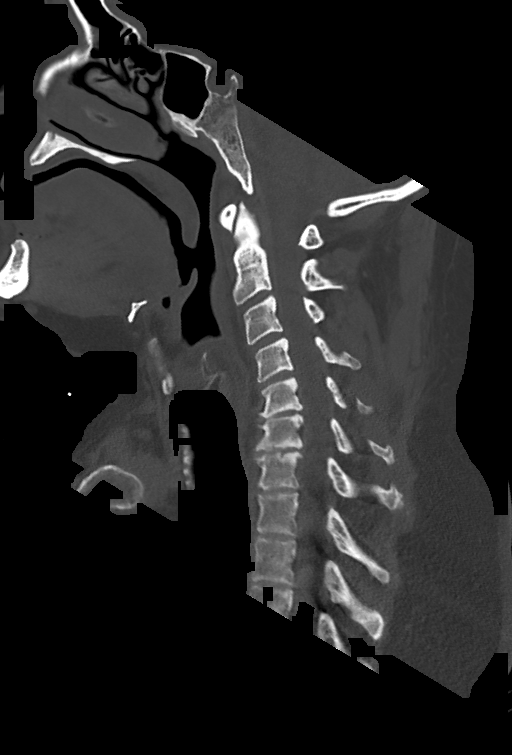
[im 48/83  bone]
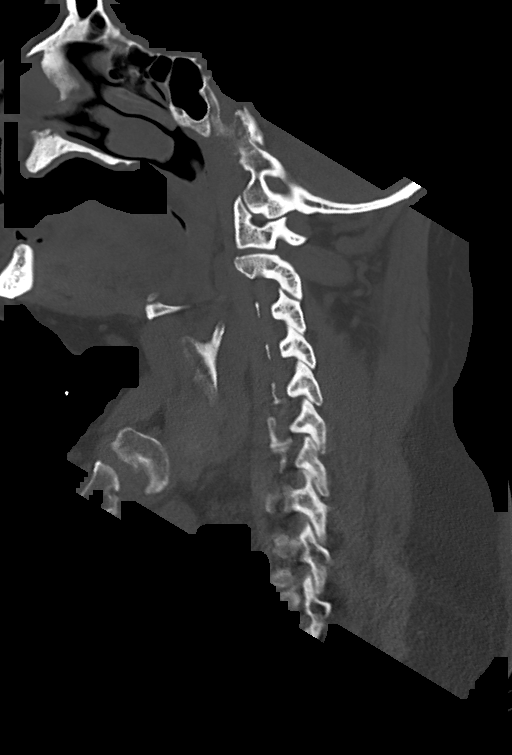
[im 55/83  bone]
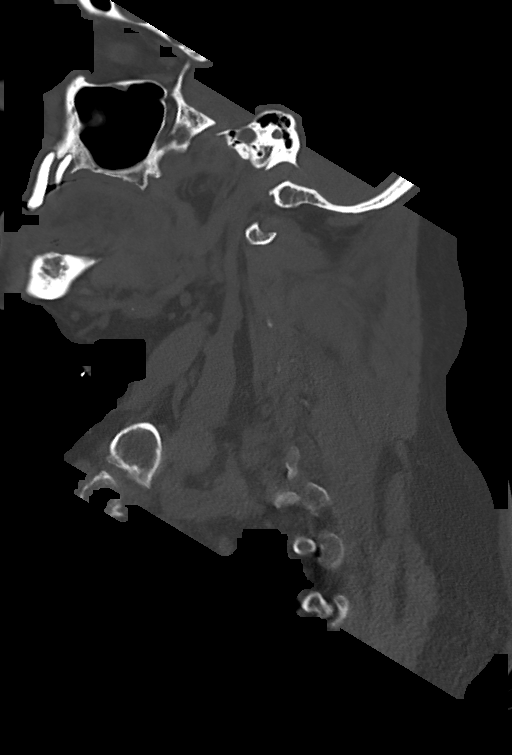

[Series 9: cor bone · coronal · 0.43mm/px · 3 of 88 slices shown]
[im 29/88  bone]
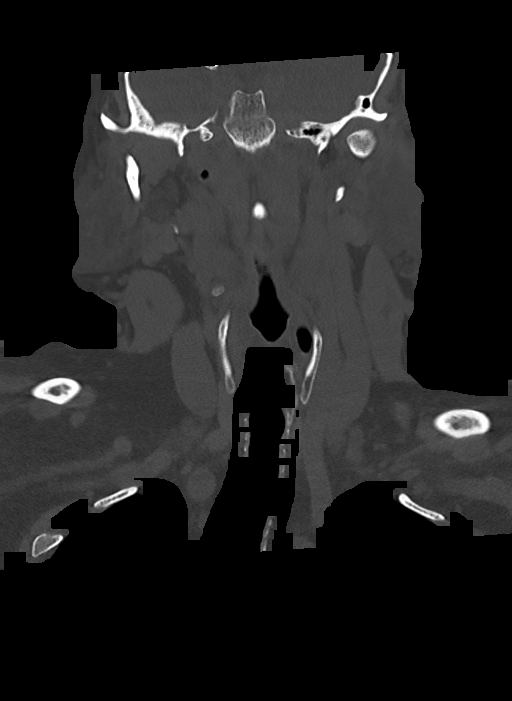
[im 39/88  bone]
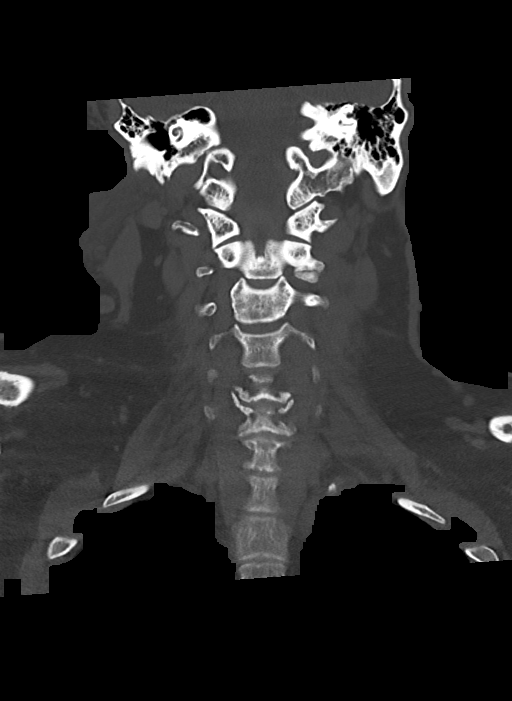
[im 49/88  bone]
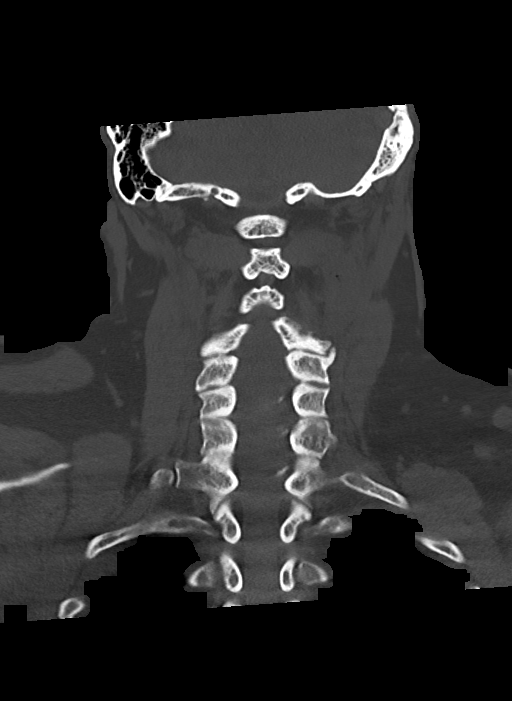

[Series 10: orthogonal axials · axial · 0.21mm/px · z∈[-265,-178]mm · 4 of 94 slices shown, 5 images]
[im 16/94  soft-tissue]
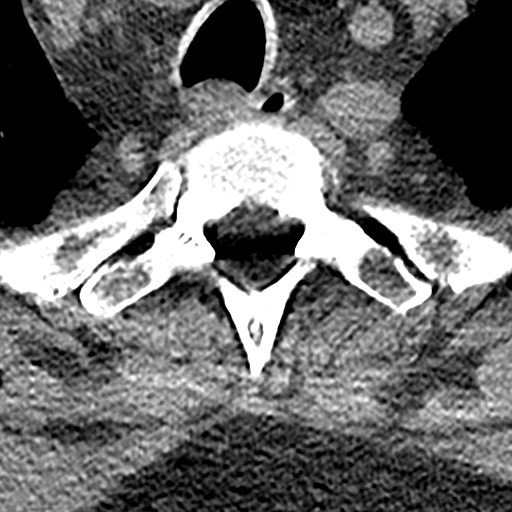
[im 16/94  bone]
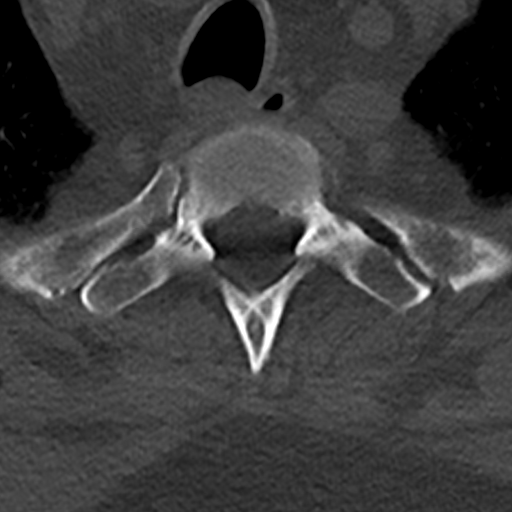
[im 32/94  bone]
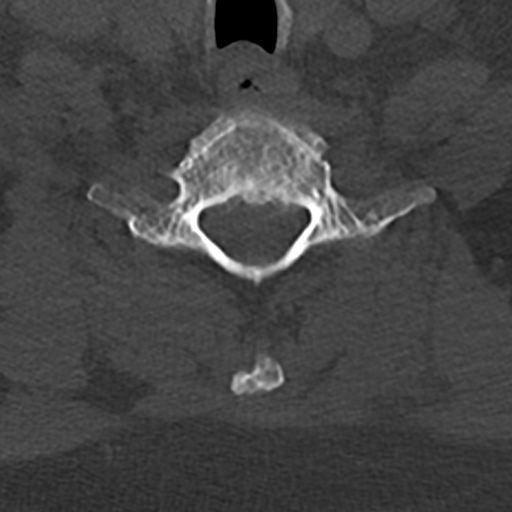
[im 63/94  bone]
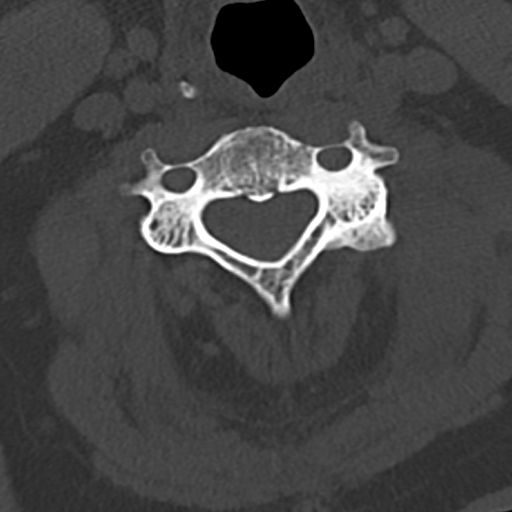
[im 78/94  bone]
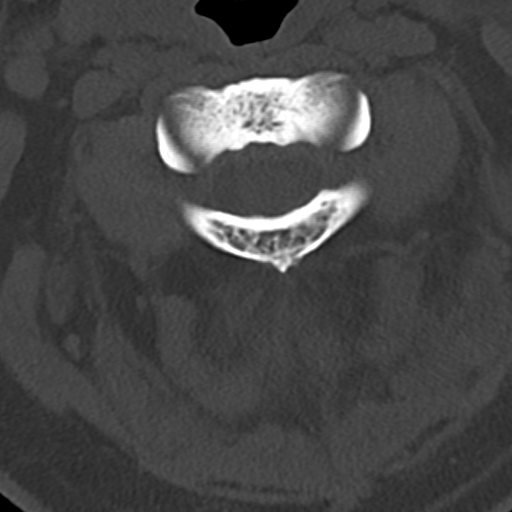

[12 of 35 positions shown; findings below may reference images not displayed]

FINDINGS: Alignment: There is reversal cervical lordosis with mild kyphosis
centered at the C5 level. This may be positional or due to lower
cervical spondylosis. Otherwise alignment is anatomic.

Skull base and vertebrae: No acute fracture. No primary bone lesion
or focal pathologic process.

Soft tissues and spinal canal: No prevertebral fluid or swelling. No
visible canal hematoma.

Disc levels: Prominent spondylosis at C5-6, C6-7, and C7-T1, with
mild symmetrical neural foraminal encroachment as result of
circumferential disc osteophyte complex and uncovertebral
hypertrophy. Left predominant facet hypertrophy at C2-3, C3-4, and
C4-5, with mild left-sided neural foraminal encroachment.

Upper chest: Airway is patent.  Lung apices are clear.

Other: Reconstructed images demonstrate no additional findings.
IMPRESSION: 1. No acute cervical spine fracture.
2. Prominent cervical spondylosis from C5-6 through C7-T1, with
symmetrical neural foraminal encroachment.
3. Left predominant facet hypertrophy from C2-3 through C4-5, with
mild left-sided neural foraminal encroachment.

## 2020-11-09 MED ORDER — HYDROCODONE-ACETAMINOPHEN 5-325 MG PO TABS: 1.0000 | ORAL_TABLET | Freq: Four times a day (QID) | ORAL | 0 refills | Status: DC | PRN

## 2020-11-09 MED ORDER — MORPHINE SULFATE (PF) 4 MG/ML IV SOLN
4.0000 mg | Freq: Once | INTRAVENOUS | Status: AC
  Administered 2020-11-09: 4 mg via INTRAMUSCULAR
  Filled 2020-11-09: qty 1

## 2020-11-09 NOTE — ED Notes (Signed)
Patient transported to CT 
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
## 2020-11-09 NOTE — ED Provider Notes (Signed)
[facility] EMERGENCY DEPARTMENT Provider Note   CSN: [id number] Arrival date & time: 11/09/20  1459     History No chief complaint on file.   Daniel Cline is a 65 y.o. male.  65 year old male with prior medical history as detailed below presents for evaluation.  Patient complains of chronic neck pain.  Patient reports that he has been diagnosed with significant arthritis of the cervical spine.  Patient complains of increased left-sided neck pain over the last 2 to 3 days.  Patient reports at home medications including ibuprofen and Aminofen are not controlling his pain.  Patient denies numbness or weakness into the left arm or left leg.  Patient reports that he has constant pain to the left side of his neck.  Symptoms over the last 72 hours are worse than his normal.  He denies fall or trauma.  He denies fever.  He denies associated chest pain, shortness of breath, nausea, vomiting, or other specific complaint.  The history is provided by the patient and medical records.  Illness Location:  Left-sided neck pain Severity:  Moderate Onset quality:  Gradual Duration:  3 days Timing:  Constant Progression:  Waxing and waning Chronicity:  Chronic      No past medical history on file.  There are no problems to display for this patient.   No past surgical history on file.     No family history on file.  Social History   Tobacco Use  . Smoking status: Never Smoker  . Smokeless tobacco: Never Used  Substance Use Topics  . Alcohol use: Yes    Comment: occ  . Drug use: Never    Home Medications Prior to Admission medications   Medication Sig Start Date End Date Taking? Authorizing Provider  hydrochlorothiazide (HYDRODIURIL) 25 MG tablet Take 1 tablet (25 mg total) by mouth daily. 08/20/18   Dartha Lodge, PA-C  metFORMIN (GLUCOPHAGE) 500 MG tablet Take 1 tablet (500 mg total) by mouth 2 (two) times daily with a meal. 08/20/18   Dartha Lodge,  PA-C  methocarbamol (ROBAXIN) 500 MG tablet Take 1 tablet (500 mg total) by mouth 2 (two) times daily. 08/20/18   Dartha Lodge, PA-C  naproxen (NAPROSYN) 500 MG tablet Take 1 tablet (500 mg total) by mouth 2 (two) times daily. 08/20/18   Dartha Lodge, PA-C    Allergies    Patient has no known allergies.  Review of Systems   Review of Systems  All other systems reviewed and are negative.   Physical Exam Updated Vital Signs BP (!) 160/103 (BP Location: Left Arm)   Pulse (!) 123   Temp 98.2 F (36.8 C) (Oral)   Resp 16   SpO2 96%   Physical Exam Vitals and nursing note reviewed.  Constitutional:      General: He is not in acute distress.    Appearance: Normal appearance. He is well-developed.  HENT:     Head: Normocephalic and atraumatic.  Eyes:     Conjunctiva/sclera: Conjunctivae normal.     Pupils: Pupils are equal, round, and reactive to light.  Neck:     Comments: Mild diffuse tenderness to the left lateral aspect of the patient's neck.  No erythema or edema.  No pulsatile mass.  No midline step-off or abnormality.   5/5 strength in both upper and lower extremities. Cardiovascular:     Rate and Rhythm: Normal rate and regular rhythm.     Heart sounds: Normal heart  sounds.  Pulmonary:     Effort: Pulmonary effort is normal. No respiratory distress.     Breath sounds: Normal breath sounds.  Abdominal:     General: There is no distension.     Palpations: Abdomen is soft.     Tenderness: There is no abdominal tenderness.  Musculoskeletal:        General: No deformity. Normal range of motion.     Cervical back: Normal range of motion and neck supple.  Skin:    General: Skin is warm and dry.  Neurological:     General: No focal deficit present.     Mental Status: He is alert and oriented to person, place, and time. Mental status is at baseline.     ED Results / Procedures / Treatments   Labs (all labs ordered are listed, but only abnormal results are  displayed) Labs Reviewed - No data to display  EKG EKG Interpretation  Date/Time:  Monday November 09 2020 15:20:54 EDT Ventricular Rate:  127 PR Interval:  140 QRS Duration: 84 QT Interval:  294 QTC Calculation: 427 R Axis:   4 Text Interpretation: Sinus tachycardia Nonspecific T wave abnormality Abnormal ECG Confirmed by Kristine Royal [phone]) on 11/09/2020 5:48:56 PM   Radiology No results found.  Procedures Procedures   Medications Ordered in ED Medications  morphine 4 MG/ML injection 4 mg (4 mg Intramuscular Given 11/09/20 1837)    ED Course  I have reviewed the triage vital signs and the nursing notes.  Pertinent labs & imaging results that were available during my care of the patient were reviewed by me and considered in my medical decision making (see chart for details).    MDM Rules/Calculators/A&P                          MDM  Screen complete  Daniel Cline was evaluated in Emergency Department on 11/09/2020 for the symptoms described in the history of present illness. He was evaluated in the context of the global COVID-19 pandemic, which necessitated consideration that the patient might be at risk for infection with the SARS-CoV-2 virus that causes COVID-19. Institutional protocols and algorithms that pertain to the evaluation of patients at risk for COVID-19 are in a state of rapid change based on information released by regulatory bodies including the CDC and federal and state organizations. These policies and algorithms were followed during the patient's care in the ED.  Patient is presenting with acute on chronic neck pain.  Patient reports longstanding history of chronic neck pain secondary to arthritis of the cervical spine.  Patient reports increased pain over the last several days without adequate control with OTC medications.  CT imaging does not reveal significant acute pathology.  Patient is improved after administration of pain medication in the  ED.  Patient is advised to follow-up closely with outpatient providers for further care and pain management.  Patient without red flag symptoms such as evidence of radiculopathy, muscular weakness, fever, or other significant finding.   Final Clinical Impression(s) / ED Diagnoses Final diagnoses:  Chronic neck pain    Rx / DC Orders ED Discharge Orders         Ordered    HYDROcodone-acetaminophen (NORCO/VICODIN) 5-325 MG tablet  Every 6 hours PRN        11/09/20 1940           Wynetta Fines, MD 11/09/20 1943

## 2020-11-09 NOTE — Discharge Instructions (Addendum)
Please return for any problem.  °

## 2020-11-09 NOTE — ED Notes (Signed)
Patient verbalizes understanding of discharge instructions. Opportunity for questioning and answers were provided. Armband removed by staff, pt discharged from ED via wheelchair.  

## 2020-11-18 ENCOUNTER — Encounter (HOSPITAL_COMMUNITY)
Admission: RE | Disposition: A | Discharge status: Rehabilitation Facility | Payer: Self-pay | Source: Ambulatory Visit | Attending: Neurosurgery

## 2020-11-18 ENCOUNTER — Ambulatory Visit (HOSPITAL_COMMUNITY): Payer: Medicare Other | Admitting: Anesthesiology

## 2020-11-18 ENCOUNTER — Ambulatory Visit (HOSPITAL_COMMUNITY)
Admission: RE | Admit: 2020-11-18 | Discharge: 2020-11-18 | Disposition: A | Discharge status: Home / Self Care | Payer: Self-pay | Source: Ambulatory Visit | Attending: Neurosurgery | Admitting: Neurosurgery

## 2020-11-18 ENCOUNTER — Inpatient Hospital Stay (HOSPITAL_COMMUNITY)
Admission: RE | Admit: 2020-11-18 | Discharge: 2020-11-28 | DRG: 853 | Disposition: A | Discharge status: Rehabilitation Facility | Payer: Medicare Other | Source: Ambulatory Visit | Attending: Internal Medicine | Admitting: Internal Medicine

## 2020-11-18 ENCOUNTER — Encounter (HOSPITAL_COMMUNITY): Payer: Self-pay | Admitting: Neurosurgery

## 2020-11-18 ENCOUNTER — Other Ambulatory Visit: Payer: Self-pay | Admitting: Neurosurgery

## 2020-11-18 ENCOUNTER — Other Ambulatory Visit (HOSPITAL_COMMUNITY): Payer: Self-pay | Admitting: Neurosurgery

## 2020-11-18 ENCOUNTER — Other Ambulatory Visit: Payer: Self-pay

## 2020-11-18 DIAGNOSIS — G9340 Encephalopathy, unspecified: Secondary | ICD-10-CM | POA: Diagnosis not present

## 2020-11-18 DIAGNOSIS — M60031 Infective myositis, right forearm: Secondary | ICD-10-CM | POA: Diagnosis present

## 2020-11-18 DIAGNOSIS — G061 Intraspinal abscess and granuloma: Secondary | ICD-10-CM | POA: Diagnosis present

## 2020-11-18 DIAGNOSIS — E1169 Type 2 diabetes mellitus with other specified complication: Secondary | ICD-10-CM | POA: Diagnosis present

## 2020-11-18 DIAGNOSIS — R0902 Hypoxemia: Secondary | ICD-10-CM

## 2020-11-18 DIAGNOSIS — E873 Alkalosis: Secondary | ICD-10-CM | POA: Diagnosis not present

## 2020-11-18 DIAGNOSIS — G062 Extradural and subdural abscess, unspecified: Secondary | ICD-10-CM | POA: Diagnosis not present

## 2020-11-18 DIAGNOSIS — G959 Disease of spinal cord, unspecified: Secondary | ICD-10-CM | POA: Diagnosis not present

## 2020-11-18 DIAGNOSIS — Z20822 Contact with and (suspected) exposure to covid-19: Secondary | ICD-10-CM | POA: Diagnosis present

## 2020-11-18 DIAGNOSIS — I160 Hypertensive urgency: Secondary | ICD-10-CM | POA: Diagnosis not present

## 2020-11-18 DIAGNOSIS — M4802 Spinal stenosis, cervical region: Secondary | ICD-10-CM | POA: Insufficient documentation

## 2020-11-18 DIAGNOSIS — K5901 Slow transit constipation: Secondary | ICD-10-CM | POA: Diagnosis present

## 2020-11-18 DIAGNOSIS — I152 Hypertension secondary to endocrine disorders: Secondary | ICD-10-CM | POA: Diagnosis present

## 2020-11-18 DIAGNOSIS — Z452 Encounter for adjustment and management of vascular access device: Secondary | ICD-10-CM

## 2020-11-18 DIAGNOSIS — E1165 Type 2 diabetes mellitus with hyperglycemia: Secondary | ICD-10-CM

## 2020-11-18 DIAGNOSIS — J9600 Acute respiratory failure, unspecified whether with hypoxia or hypercapnia: Secondary | ICD-10-CM

## 2020-11-18 DIAGNOSIS — G992 Myelopathy in diseases classified elsewhere: Secondary | ICD-10-CM | POA: Insufficient documentation

## 2020-11-18 DIAGNOSIS — Z79899 Other long term (current) drug therapy: Secondary | ICD-10-CM

## 2020-11-18 DIAGNOSIS — D6489 Other specified anemias: Secondary | ICD-10-CM | POA: Diagnosis present

## 2020-11-18 DIAGNOSIS — L8990 Pressure ulcer of unspecified site, unspecified stage: Secondary | ICD-10-CM | POA: Diagnosis present

## 2020-11-18 DIAGNOSIS — A4101 Sepsis due to Methicillin susceptible Staphylococcus aureus: Secondary | ICD-10-CM | POA: Diagnosis present

## 2020-11-18 DIAGNOSIS — Z7984 Long term (current) use of oral hypoglycemic drugs: Secondary | ICD-10-CM

## 2020-11-18 DIAGNOSIS — Z9114 Patient's other noncompliance with medication regimen: Secondary | ICD-10-CM | POA: Diagnosis not present

## 2020-11-18 DIAGNOSIS — L0291 Cutaneous abscess, unspecified: Secondary | ICD-10-CM | POA: Diagnosis present

## 2020-11-18 DIAGNOSIS — E1110 Type 2 diabetes mellitus with ketoacidosis without coma: Secondary | ICD-10-CM | POA: Diagnosis present

## 2020-11-18 DIAGNOSIS — E876 Hypokalemia: Secondary | ICD-10-CM | POA: Diagnosis not present

## 2020-11-18 DIAGNOSIS — J9601 Acute respiratory failure with hypoxia: Secondary | ICD-10-CM | POA: Diagnosis not present

## 2020-11-18 DIAGNOSIS — Z4659 Encounter for fitting and adjustment of other gastrointestinal appliance and device: Secondary | ICD-10-CM

## 2020-11-18 DIAGNOSIS — R5381 Other malaise: Secondary | ICD-10-CM | POA: Diagnosis present

## 2020-11-18 DIAGNOSIS — R945 Abnormal results of liver function studies: Secondary | ICD-10-CM

## 2020-11-18 DIAGNOSIS — Z87820 Personal history of traumatic brain injury: Secondary | ICD-10-CM | POA: Diagnosis not present

## 2020-11-18 DIAGNOSIS — K592 Neurogenic bowel, not elsewhere classified: Secondary | ICD-10-CM | POA: Diagnosis not present

## 2020-11-18 DIAGNOSIS — E871 Hypo-osmolality and hyponatremia: Secondary | ICD-10-CM | POA: Diagnosis not present

## 2020-11-18 DIAGNOSIS — A4901 Methicillin susceptible Staphylococcus aureus infection, unspecified site: Secondary | ICD-10-CM

## 2020-11-18 DIAGNOSIS — R339 Retention of urine, unspecified: Secondary | ICD-10-CM | POA: Diagnosis not present

## 2020-11-18 DIAGNOSIS — E43 Unspecified severe protein-calorie malnutrition: Secondary | ICD-10-CM | POA: Diagnosis not present

## 2020-11-18 DIAGNOSIS — L02413 Cutaneous abscess of right upper limb: Secondary | ICD-10-CM | POA: Diagnosis present

## 2020-11-18 DIAGNOSIS — I10 Essential (primary) hypertension: Secondary | ICD-10-CM

## 2020-11-18 DIAGNOSIS — N319 Neuromuscular dysfunction of bladder, unspecified: Secondary | ICD-10-CM | POA: Diagnosis not present

## 2020-11-18 DIAGNOSIS — K5900 Constipation, unspecified: Secondary | ICD-10-CM

## 2020-11-18 DIAGNOSIS — R52 Pain, unspecified: Secondary | ICD-10-CM | POA: Diagnosis not present

## 2020-11-18 DIAGNOSIS — E1159 Type 2 diabetes mellitus with other circulatory complications: Secondary | ICD-10-CM

## 2020-11-18 DIAGNOSIS — L89152 Pressure ulcer of sacral region, stage 2: Secondary | ICD-10-CM | POA: Diagnosis present

## 2020-11-18 DIAGNOSIS — E11649 Type 2 diabetes mellitus with hypoglycemia without coma: Secondary | ICD-10-CM | POA: Diagnosis not present

## 2020-11-18 DIAGNOSIS — D649 Anemia, unspecified: Secondary | ICD-10-CM

## 2020-11-18 DIAGNOSIS — R7989 Other specified abnormal findings of blood chemistry: Secondary | ICD-10-CM

## 2020-11-18 HISTORY — DX: Type 2 diabetes mellitus without complications: E11.9

## 2020-11-18 HISTORY — PX: POSTERIOR CERVICAL LAMINECTOMY: SHX2248

## 2020-11-18 LAB — CBC
HCT: 44.6 % (ref 39.0–52.0)
Hemoglobin: 15.3 g/dL (ref 13.0–17.0)
MCH: 31.3 pg (ref 26.0–34.0)
MCHC: 34.3 g/dL (ref 30.0–36.0)
MCV: 91.2 fL (ref 80.0–100.0)
Platelets: 427 10*3/uL — ABNORMAL HIGH (ref 150–400)
RBC: 4.89 MIL/uL (ref 4.22–5.81)
RDW: 12.7 % (ref 11.5–15.5)
WBC: 36.6 10*3/uL — ABNORMAL HIGH (ref 4.0–10.5)
nRBC: 0 % (ref 0.0–0.2)

## 2020-11-18 LAB — CBC WITH DIFFERENTIAL/PLATELET
Abs Immature Granulocytes: 1.03 10*3/uL — ABNORMAL HIGH (ref 0.00–0.07)
Basophils Absolute: 0.2 10*3/uL — ABNORMAL HIGH (ref 0.0–0.1)
Basophils Relative: 0 %
Eosinophils Absolute: 0 10*3/uL (ref 0.0–0.5)
Eosinophils Relative: 0 %
HCT: 45.2 % (ref 39.0–52.0)
Hemoglobin: 14.8 g/dL (ref 13.0–17.0)
Immature Granulocytes: 3 %
Lymphocytes Relative: 6 %
Lymphs Abs: 2.3 10*3/uL (ref 0.7–4.0)
MCH: 30.6 pg (ref 26.0–34.0)
MCHC: 32.7 g/dL (ref 30.0–36.0)
MCV: 93.4 fL (ref 80.0–100.0)
Monocytes Absolute: 1.3 10*3/uL — ABNORMAL HIGH (ref 0.1–1.0)
Monocytes Relative: 3 %
Neutro Abs: 34.6 10*3/uL — ABNORMAL HIGH (ref 1.7–7.7)
Neutrophils Relative %: 88 %
Platelets: 414 10*3/uL — ABNORMAL HIGH (ref 150–400)
RBC: 4.84 MIL/uL (ref 4.22–5.81)
RDW: 12.6 % (ref 11.5–15.5)
WBC: 39.4 10*3/uL — ABNORMAL HIGH (ref 4.0–10.5)
nRBC: 0 % (ref 0.0–0.2)

## 2020-11-18 LAB — POCT I-STAT, CHEM 8
BUN: 38 mg/dL — ABNORMAL HIGH (ref 8–23)
Calcium, Ion: 1.13 mmol/L — ABNORMAL LOW (ref 1.15–1.40)
Chloride: 94 mmol/L — ABNORMAL LOW (ref 98–111)
Creatinine, Ser: 0.6 mg/dL — ABNORMAL LOW (ref 0.61–1.24)
Glucose, Bld: 361 mg/dL — ABNORMAL HIGH (ref 70–99)
HCT: 41 % (ref 39.0–52.0)
Hemoglobin: 13.9 g/dL (ref 13.0–17.0)
Potassium: 3.6 mmol/L (ref 3.5–5.1)
Sodium: 135 mmol/L (ref 135–145)
TCO2: 27 mmol/L (ref 22–32)

## 2020-11-18 LAB — BASIC METABOLIC PANEL
Anion gap: 17 — ABNORMAL HIGH (ref 5–15)
BUN: 28 mg/dL — ABNORMAL HIGH (ref 8–23)
CO2: 24 mmol/L (ref 22–32)
Calcium: 8.9 mg/dL (ref 8.9–10.3)
Chloride: 93 mmol/L — ABNORMAL LOW (ref 98–111)
Creatinine, Ser: 1.02 mg/dL (ref 0.61–1.24)
GFR, Estimated: >60 mL/min (ref >60)
Glucose, Bld: 378 mg/dL — ABNORMAL HIGH (ref 70–99)
Potassium: 3.6 mmol/L (ref 3.5–5.1)
Sodium: 134 mmol/L — ABNORMAL LOW (ref 135–145)

## 2020-11-18 LAB — GLUCOSE, CAPILLARY
Glucose-Capillary: 229 mg/dL — ABNORMAL HIGH (ref 70–99)
Glucose-Capillary: 293 mg/dL — ABNORMAL HIGH (ref 70–99)
Glucose-Capillary: 255 mg/dL — ABNORMAL HIGH (ref 70–99)
Glucose-Capillary: 176 mg/dL — ABNORMAL HIGH (ref 70–99)

## 2020-11-18 LAB — POCT I-STAT 7, (LYTES, BLD GAS, ICA,H+H)
Acid-Base Excess: 3 mmol/L — ABNORMAL HIGH (ref 0.0–2.0)
Bicarbonate: 27.5 mmol/L (ref 20.0–28.0)
Calcium, Ion: 1.17 mmol/L (ref 1.15–1.40)
HCT: 40 % (ref 39.0–52.0)
Hemoglobin: 13.6 g/dL (ref 13.0–17.0)
O2 Saturation: 100 %
Patient temperature: 36.1
Potassium: 3.1 mmol/L — ABNORMAL LOW (ref 3.5–5.1)
Sodium: 134 mmol/L — ABNORMAL LOW (ref 135–145)
TCO2: 29 mmol/L (ref 22–32)
pCO2 arterial: 40.1 mmHg (ref 32.0–48.0)
pH, Arterial: 7.44 (ref 7.350–7.450)
pO2, Arterial: 308 mmHg — ABNORMAL HIGH (ref 83.0–108.0)

## 2020-11-18 LAB — SARS CORONAVIRUS 2 BY RT PCR (HOSPITAL ORDER, PERFORMED IN ~~LOC~~ HOSPITAL LAB): SARS Coronavirus 2: NEGATIVE

## 2020-11-18 LAB — BETA-HYDROXYBUTYRIC ACID: Beta-Hydroxybutyric Acid: 0.9 mmol/L — ABNORMAL HIGH (ref 0.05–0.27)

## 2020-11-18 IMAGING — MR MR CERVICAL SPINE W/O CM
4 of 5 series · 17 of 48 positions shown · non-contrast
Comparison: CT of the cervical spine [DATE].

CLINICAL DATA: Stenosis of the cervical spine with myelopathy.

EXAM:
MRI CERVICAL SPINE WITHOUT CONTRAST
TECHNIQUE: Multiplanar, multisequence MR imaging of the cervical spine was
performed. No intravenous contrast was administered.

[Series 3: T2 · sagittal · 3.0mm · 0.43mm/px · 5 of 16 slices shown (1 of 2)]
[im 1/16]
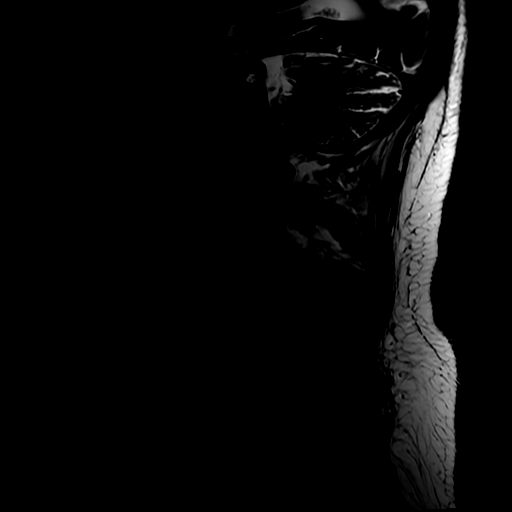
[im 4/16]
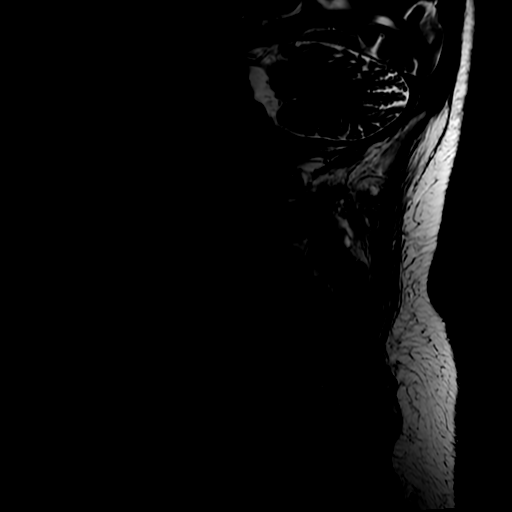
[im 8/16]
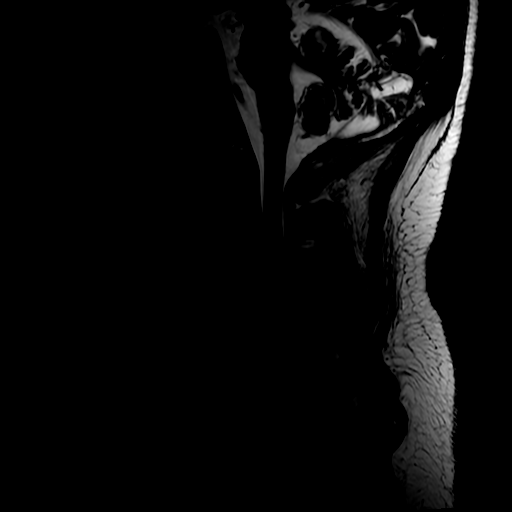
[im 12/16]
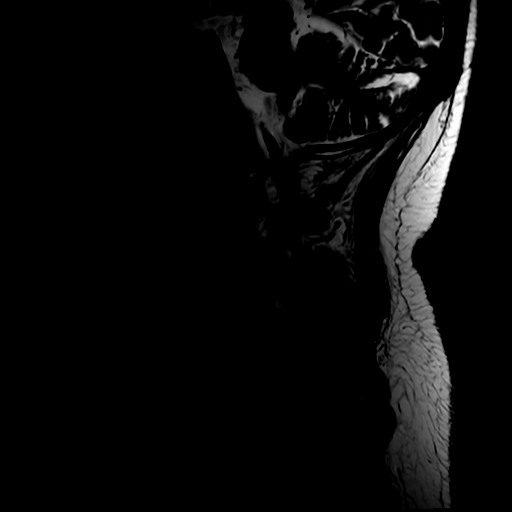
[im 16/16]
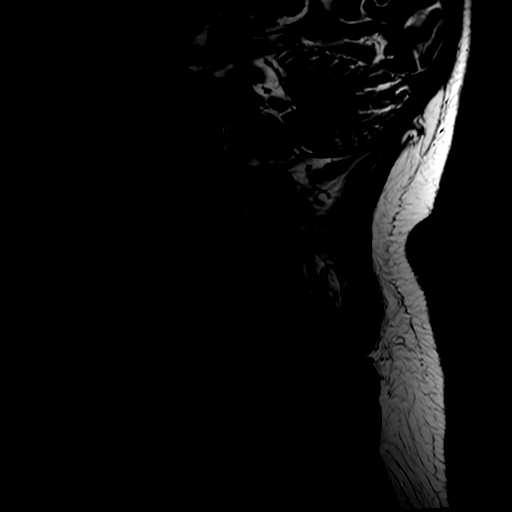

[Series 4: FLAIR · sagittal · 3.0mm · 0.43mm/px · 3 of 16 slices shown]
[im 1/16]
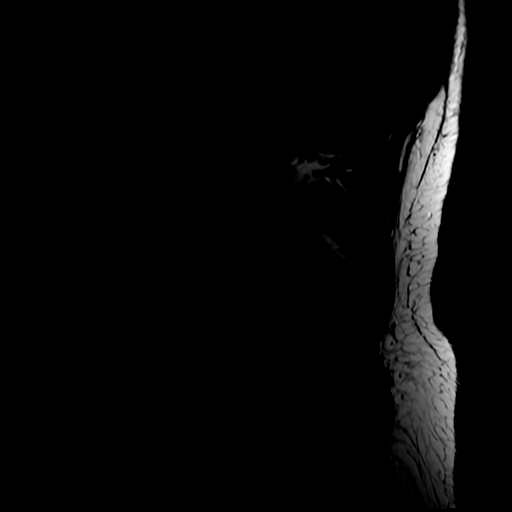
[im 8/16]
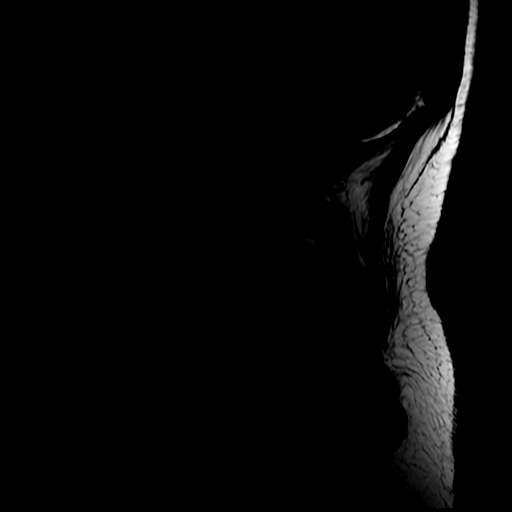
[im 16/16]
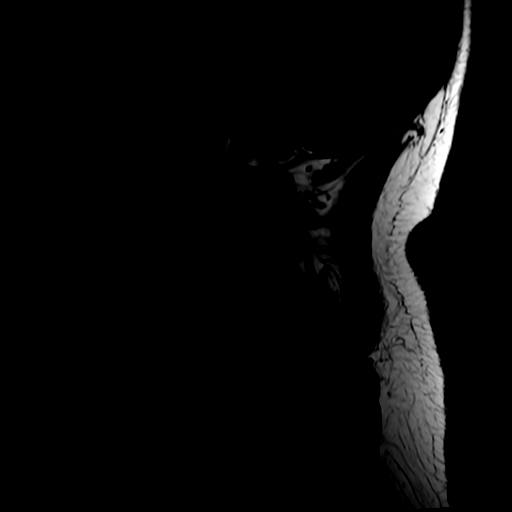

[Series 5: STIR · sagittal · 3.0mm · 0.43mm/px · 3 of 16 slices shown]
[im 1/16]
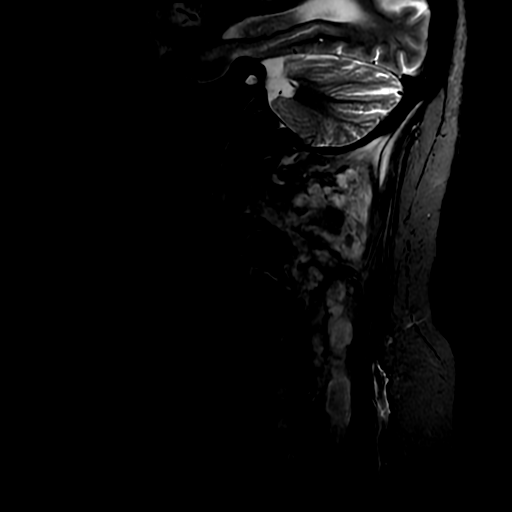
[im 8/16]
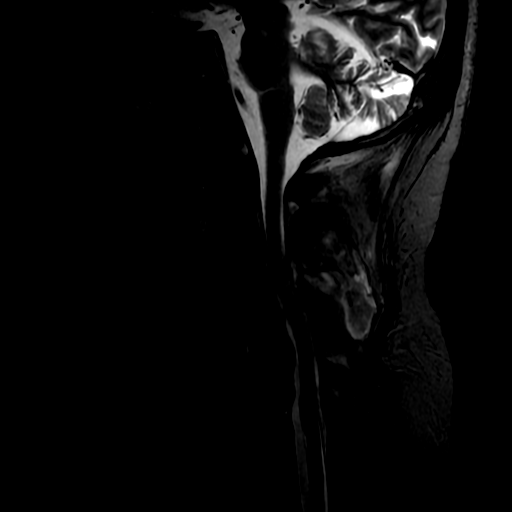
[im 16/16]
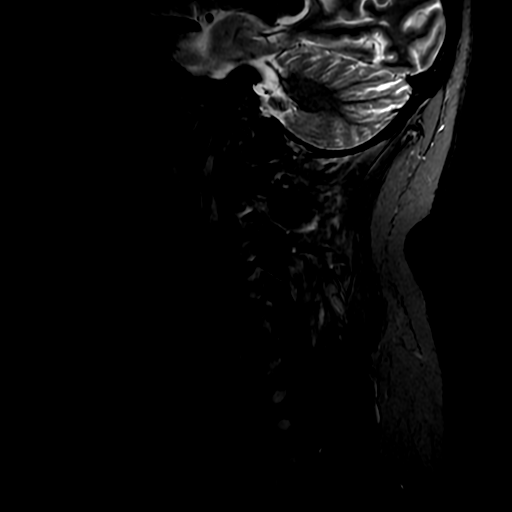

[Series 6: T2 · axial · 3.0mm · 0.35mm/px · z∈[-99,-28]mm · 6 of 29 slices shown (2 of 2)]
[im 1/29]
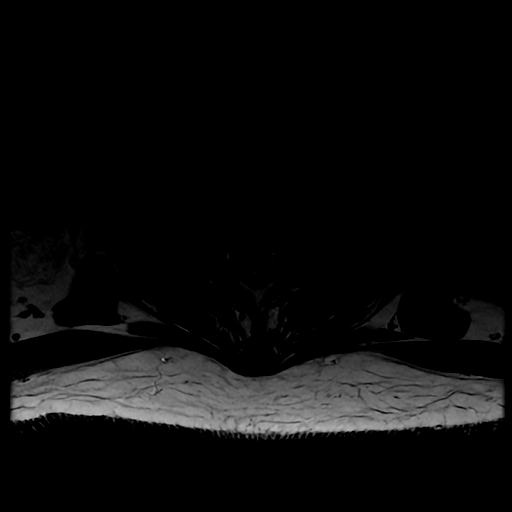
[im 4/29]
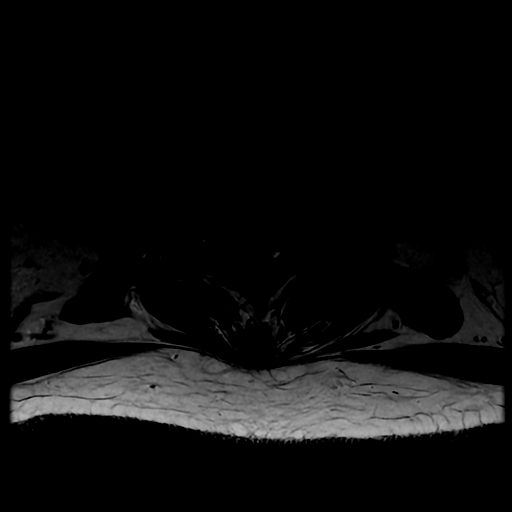
[im 8/29]
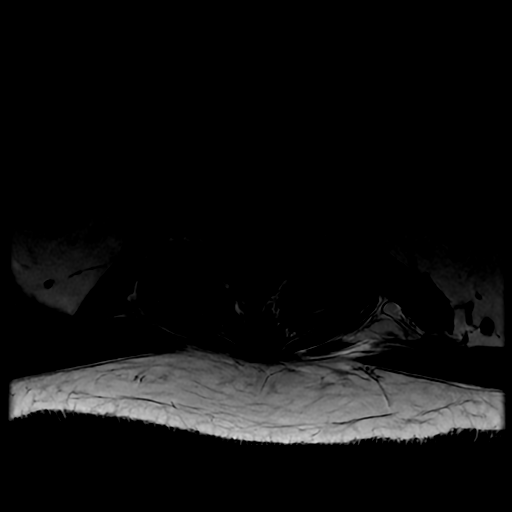
[im 11/29]
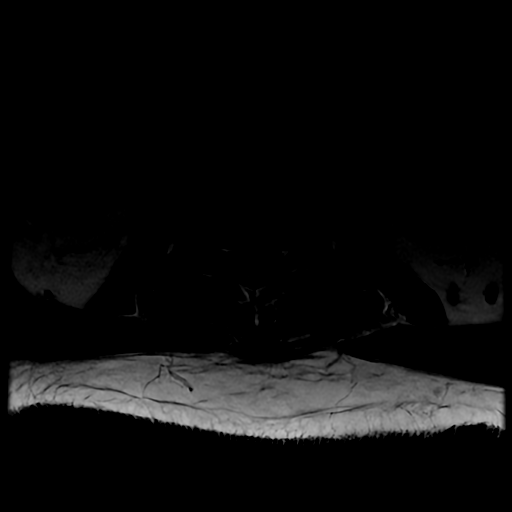
[im 15/29]
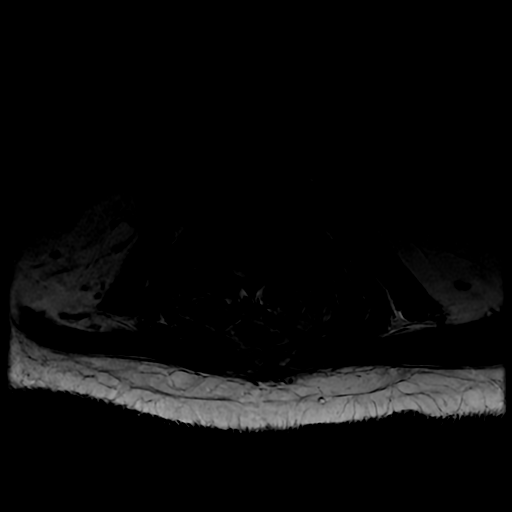
[im 25/29]
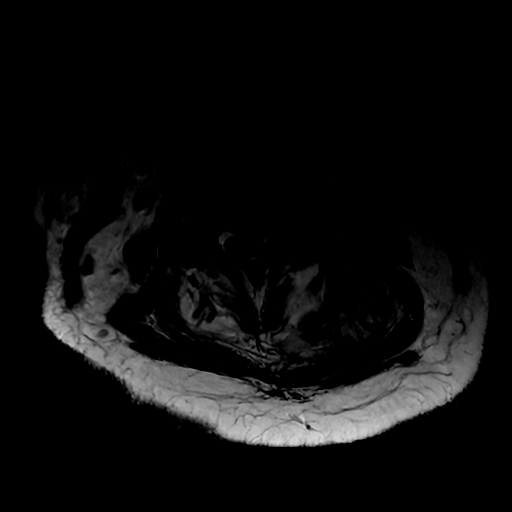

[17 of 48 positions shown; findings below may reference images not displayed]

FINDINGS: Alignment: Mild reversal of the cervical curvature.

Vertebrae: No fracture, evidence of discitis, or bone lesion.

Cord: Mass effect on the cord from C4 through C7. No cord signal
abnormality.

Posterior Fossa, vertebral arteries, paraspinal tissues: Prominent
multiloculated fluid collections involving the paraspinal
musculature from the level of C1 through C7, predominantly on the
left with susceptibility artifact noted in the largest pocket within
the left semispinalis muscle at the C2 level, likely representing
air. This fluid collections communicate with a large epidural fluid
collection through the interlaminar space at the C3-4 level. The
epidural fluid collection extending from the level of C3 through the
visualized upper thoracic spine. There is mass effect on the cord
with obliteration of the CSF space at C5-6 and C6-7.

Disc levels:

C2-3: Small posterior disc protrusion resulting mild spinal canal
stenosis. Prominent facet degenerative changes with bilateral joint
effusion resulting mild-to-moderate left neural foraminal narrowing.

C3-4: Posterior disc protrusion which in association with the
epidural collection results in moderate narrowing of the thecal sac.
Uncovertebral and facet degenerative changes resulting in severe
left neural foraminal narrowing.

C4-5: Small posterior disc protrusion which in association with the
epidural collection resulting in moderate narrowing of the thecal
sac. Mild uncovertebral degenerative change and facet degenerative
changes, more pronounced on the left, resulting in mild left neural
foraminal narrowing.

C5-6: Posterior disc protrusion which in association with the
epidural collection resulting in moderate narrowing of the thecal
sac. Uncovertebral and facet degenerative changes resulting in
severe right and mild left neural foraminal narrowing.

C6-7: Posterior disc protrusion which in association with the
epidural collection resulting in narrowing of the thecal sac.
Uncovertebral and facet degenerative changes result severe left
neural foraminal narrowing.

C7-T1: Epidural fluid collection resulting in moderate narrowing of
the thecal sac. Uncovertebral and facet degenerative changes
resulting in mild left neural foraminal narrowing.
IMPRESSION: 1. Prominent multiloculated fluid collections involving the
paraspinal musculature from the level of C1 through C7. This
collection communicates with and extensive epidural fluid collection
through the interlaminar space at the C3-4 level. The epidural
collection extends from C2 through the visualized upper thoracic
spine. Findings are most consistent with epidural abscess and
myositis. Recommend MRI of the thoracic spine without and with
contrast to evaluate for disease extension.
2. There is mass effect on the cord with obliteration of the CSF
space at C5-6 and C6-7.
3. Multilevel degenerative changes of the cervical spine, as
described above.

These results were called by telephone at the time of interpretation
on [DATE] at [DATE] to Dr. GIANG , who verbally
acknowledged these results.

## 2020-11-18 SURGERY — POSTERIOR CERVICAL LAMINECTOMY
Anesthesia: General | Site: Neck

## 2020-11-18 MED ORDER — SODIUM CHLORIDE 0.9 % IV SOLN: 250.0000 mL | INTRAVENOUS | Status: DC

## 2020-11-18 MED ORDER — LACTATED RINGERS IV SOLN: INTRAVENOUS | Status: DC

## 2020-11-18 MED ORDER — INSULIN ASPART 100 UNIT/ML ~~LOC~~ SOLN: 0.0000 [IU] | Freq: Three times a day (TID) | SUBCUTANEOUS | Status: DC

## 2020-11-18 MED ORDER — SODIUM CHLORIDE 0.9 % IV SOLN: INTRAVENOUS | Status: DC

## 2020-11-18 MED ORDER — ONDANSETRON HCL 4 MG/2ML IJ SOLN
INTRAMUSCULAR | Status: AC
  Filled 2020-11-18: qty 2

## 2020-11-18 MED ORDER — HEMOSTATIC AGENTS (NO CHARGE) OPTIME
TOPICAL | Status: DC | PRN
  Administered 2020-11-18: 1 via TOPICAL

## 2020-11-18 MED ORDER — LABETALOL HCL 5 MG/ML IV SOLN
10.0000 mg | INTRAVENOUS | Status: AC | PRN
  Administered 2020-11-18 – 2020-11-20 (×9): 20 mg via INTRAVENOUS
  Filled 2020-11-18 (×10): qty 4

## 2020-11-18 MED ORDER — FENTANYL CITRATE (PF) 250 MCG/5ML IJ SOLN
INTRAMUSCULAR | Status: AC
  Filled 2020-11-18: qty 5

## 2020-11-18 MED ORDER — SODIUM CHLORIDE 0.9% FLUSH: 3.0000 mL | INTRAVENOUS | Status: DC | PRN

## 2020-11-18 MED ORDER — ACETAMINOPHEN 325 MG PO TABS
650.0000 mg | ORAL_TABLET | ORAL | Status: DC | PRN
  Administered 2020-11-24: 650 mg via ORAL
  Filled 2020-11-18: qty 2

## 2020-11-18 MED ORDER — BACITRACIN ZINC 500 UNIT/GM EX OINT
TOPICAL_OINTMENT | CUTANEOUS | Status: AC
  Filled 2020-11-18: qty 28.35

## 2020-11-18 MED ORDER — ONDANSETRON HCL 4 MG PO TABS: 4.0000 mg | ORAL_TABLET | Freq: Four times a day (QID) | ORAL | Status: DC | PRN

## 2020-11-18 MED ORDER — FENTANYL CITRATE (PF) 100 MCG/2ML IJ SOLN
25.0000 ug | INTRAMUSCULAR | Status: DC | PRN
  Administered 2020-11-18: 50 ug via INTRAVENOUS

## 2020-11-18 MED ORDER — LIDOCAINE 2% (20 MG/ML) 5 ML SYRINGE
INTRAMUSCULAR | Status: DC | PRN
  Administered 2020-11-18: 60 mg via INTRAVENOUS

## 2020-11-18 MED ORDER — LACTATED RINGERS IV BOLUS: 1000.0000 mL | Freq: Once | INTRAVENOUS | Status: DC

## 2020-11-18 MED ORDER — HYDROMORPHONE HCL 1 MG/ML IJ SOLN
1.0000 mg | INTRAMUSCULAR | Status: DC | PRN
  Administered 2020-11-19: 1 mg via INTRAVENOUS
  Filled 2020-11-18: qty 1

## 2020-11-18 MED ORDER — INSULIN REGULAR(HUMAN) IN NACL 100-0.9 UT/100ML-% IV SOLN
INTRAVENOUS | Status: DC
  Filled 2020-11-18: qty 100

## 2020-11-18 MED ORDER — SODIUM CHLORIDE 0.9 % IV SOLN: 2.0000 g | Freq: Two times a day (BID) | INTRAVENOUS | Status: DC

## 2020-11-18 MED ORDER — MORPHINE SULFATE (PF) 2 MG/ML IV SOLN
1.0000 mg | INTRAVENOUS | Status: DC | PRN
  Administered 2020-11-20 (×2): 2 mg via INTRAVENOUS
  Filled 2020-11-18 (×2): qty 1

## 2020-11-18 MED ORDER — INSULIN REGULAR(HUMAN) IN NACL 100-0.9 UT/100ML-% IV SOLN
INTRAVENOUS | Status: DC | PRN
  Administered 2020-11-18: 17 [IU]/h via INTRAVENOUS

## 2020-11-18 MED ORDER — MIDAZOLAM HCL 2 MG/2ML IJ SOLN
INTRAMUSCULAR | Status: DC | PRN
  Administered 2020-11-18: 1 mg via INTRAVENOUS

## 2020-11-18 MED ORDER — VANCOMYCIN HCL 1000 MG/200ML IV SOLN
1000.0000 mg | Freq: Two times a day (BID) | INTRAVENOUS | Status: DC
  Administered 2020-11-19 – 2020-11-22 (×7): 1000 mg via INTRAVENOUS
  Filled 2020-11-18 (×7): qty 200

## 2020-11-18 MED ORDER — PANTOPRAZOLE SODIUM 40 MG IV SOLR
40.0000 mg | Freq: Every day | INTRAVENOUS | Status: DC
  Administered 2020-11-19: 40 mg via INTRAVENOUS
  Filled 2020-11-18: qty 40

## 2020-11-18 MED ORDER — METOPROLOL TARTRATE 50 MG PO TABS
50.0000 mg | ORAL_TABLET | Freq: Two times a day (BID) | ORAL | Status: DC
  Administered 2020-11-19 – 2020-11-20 (×3): 50 mg via ORAL
  Filled 2020-11-18 (×3): qty 1

## 2020-11-18 MED ORDER — PHENOL 1.4 % MT LIQD: 1.0000 | OROMUCOSAL | Status: DC | PRN

## 2020-11-18 MED ORDER — VANCOMYCIN HCL 1750 MG/350ML IV SOLN
1750.0000 mg | Freq: Once | INTRAVENOUS | Status: AC
  Administered 2020-11-18: 1750 mg via INTRAVENOUS
  Filled 2020-11-18: qty 350

## 2020-11-18 MED ORDER — SUGAMMADEX SODIUM 200 MG/2ML IV SOLN
INTRAVENOUS | Status: DC | PRN
  Administered 2020-11-18: 400 mg via INTRAVENOUS

## 2020-11-18 MED ORDER — CHLORHEXIDINE GLUCONATE 0.12 % MT SOLN
15.0000 mL | Freq: Once | OROMUCOSAL | Status: AC
  Administered 2020-11-18: 15 mL via OROMUCOSAL
  Filled 2020-11-18: qty 15

## 2020-11-18 MED ORDER — ESMOLOL HCL 100 MG/10ML IV SOLN
INTRAVENOUS | Status: DC | PRN
  Administered 2020-11-18: 70 mg via INTRAVENOUS

## 2020-11-18 MED ORDER — LABETALOL HCL 5 MG/ML IV SOLN
10.0000 mg | Freq: Once | INTRAVENOUS | Status: AC
  Administered 2020-11-18: 10 mg via INTRAVENOUS

## 2020-11-18 MED ORDER — LABETALOL HCL 5 MG/ML IV SOLN
INTRAVENOUS | Status: AC
  Filled 2020-11-18: qty 4

## 2020-11-18 MED ORDER — ROCURONIUM BROMIDE 10 MG/ML (PF) SYRINGE
PREFILLED_SYRINGE | INTRAVENOUS | Status: AC
  Filled 2020-11-18: qty 10

## 2020-11-18 MED ORDER — SUCCINYLCHOLINE CHLORIDE 200 MG/10ML IV SOSY
PREFILLED_SYRINGE | INTRAVENOUS | Status: DC | PRN
  Administered 2020-11-18: 100 mg via INTRAVENOUS

## 2020-11-18 MED ORDER — VANCOMYCIN HCL 1000 MG IV SOLR
INTRAVENOUS | Status: DC | PRN
  Administered 2020-11-18: 1000 mg

## 2020-11-18 MED ORDER — PHENYLEPHRINE HCL-NACL 10-0.9 MG/250ML-% IV SOLN
INTRAVENOUS | Status: DC | PRN
  Administered 2020-11-18: 10 ug/min via INTRAVENOUS

## 2020-11-18 MED ORDER — PROPOFOL 10 MG/ML IV BOLUS
INTRAVENOUS | Status: AC
  Filled 2020-11-18: qty 40

## 2020-11-18 MED ORDER — ROCURONIUM BROMIDE 10 MG/ML (PF) SYRINGE
PREFILLED_SYRINGE | INTRAVENOUS | Status: DC | PRN
  Administered 2020-11-18: 70 mg via INTRAVENOUS

## 2020-11-18 MED ORDER — ONDANSETRON HCL 4 MG/2ML IJ SOLN: 4.0000 mg | Freq: Four times a day (QID) | INTRAMUSCULAR | Status: DC | PRN

## 2020-11-18 MED ORDER — NICARDIPINE HCL IN NACL 20-0.86 MG/200ML-% IV SOLN: 0.0000 mg/h | INTRAVENOUS | Status: DC

## 2020-11-18 MED ORDER — PROPOFOL 10 MG/ML IV BOLUS
INTRAVENOUS | Status: DC | PRN
  Administered 2020-11-18: 120 mg via INTRAVENOUS
  Administered 2020-11-18: 60 mg via INTRAVENOUS

## 2020-11-18 MED ORDER — 0.9 % SODIUM CHLORIDE (POUR BTL) OPTIME
TOPICAL | Status: DC | PRN
  Administered 2020-11-18: 1000 mL

## 2020-11-18 MED ORDER — OXYCODONE HCL 5 MG PO TABS
10.0000 mg | ORAL_TABLET | ORAL | Status: DC | PRN
Start: 2020-11-18 — End: 2020-11-20
  Administered 2020-11-19: 10 mg via ORAL
  Filled 2020-11-18: qty 2

## 2020-11-18 MED ORDER — SODIUM CHLORIDE 0.9 % IV SOLN: INTRAVENOUS | Status: DC | PRN

## 2020-11-18 MED ORDER — HYDROCODONE-ACETAMINOPHEN 10-325 MG PO TABS
1.0000 | ORAL_TABLET | ORAL | Status: DC | PRN
  Administered 2020-11-20 – 2020-11-25 (×10): 1 via ORAL
  Filled 2020-11-18 (×11): qty 1

## 2020-11-18 MED ORDER — THROMBIN 5000 UNITS EX SOLR
CUTANEOUS | Status: DC | PRN
  Administered 2020-11-18 (×2): 5000 [IU] via TOPICAL

## 2020-11-18 MED ORDER — LABETALOL HCL 5 MG/ML IV SOLN
INTRAVENOUS | Status: DC | PRN
  Administered 2020-11-18 (×2): 10 mg via INTRAVENOUS

## 2020-11-18 MED ORDER — NICARDIPINE HCL IN NACL 20-0.86 MG/200ML-% IV SOLN
0.0000 mg/h | INTRAVENOUS | Status: DC
  Filled 2020-11-18: qty 200

## 2020-11-18 MED ORDER — DEXAMETHASONE SODIUM PHOSPHATE 10 MG/ML IJ SOLN
INTRAMUSCULAR | Status: AC
  Filled 2020-11-18: qty 1

## 2020-11-18 MED ORDER — BISACODYL 10 MG RE SUPP: 10.0000 mg | Freq: Every day | RECTAL | Status: DC | PRN

## 2020-11-18 MED ORDER — VANCOMYCIN HCL 1000 MG IV SOLR
INTRAVENOUS | Status: AC
  Filled 2020-11-18: qty 1000

## 2020-11-18 MED ORDER — ORAL CARE MOUTH RINSE: 15.0000 mL | Freq: Once | OROMUCOSAL | Status: AC

## 2020-11-18 MED ORDER — POTASSIUM CHLORIDE 10 MEQ/100ML IV SOLN
10.0000 meq | INTRAVENOUS | Status: AC
  Administered 2020-11-18 – 2020-11-19 (×4): 10 meq via INTRAVENOUS
  Filled 2020-11-18 (×5): qty 100

## 2020-11-18 MED ORDER — MENTHOL 3 MG MT LOZG: 1.0000 | LOZENGE | OROMUCOSAL | Status: DC | PRN

## 2020-11-18 MED ORDER — KETOROLAC TROMETHAMINE 15 MG/ML IJ SOLN
15.0000 mg | Freq: Once | INTRAMUSCULAR | Status: AC
  Administered 2020-11-18: 15 mg via INTRAVENOUS
  Filled 2020-11-18: qty 1

## 2020-11-18 MED ORDER — FENTANYL CITRATE (PF) 250 MCG/5ML IJ SOLN
INTRAMUSCULAR | Status: DC | PRN
  Administered 2020-11-18 (×2): 100 ug via INTRAVENOUS

## 2020-11-18 MED ORDER — CEFAZOLIN SODIUM-DEXTROSE 2-3 GM-%(50ML) IV SOLR
INTRAVENOUS | Status: DC | PRN
  Administered 2020-11-18: 2 g via INTRAVENOUS

## 2020-11-18 MED ORDER — AMISULPRIDE (ANTIEMETIC) 5 MG/2ML IV SOLN: 10.0000 mg | Freq: Once | INTRAVENOUS | Status: DC | PRN

## 2020-11-18 MED ORDER — ACETAMINOPHEN 650 MG RE SUPP: 650.0000 mg | RECTAL | Status: DC | PRN

## 2020-11-18 MED ORDER — DIAZEPAM 2 MG PO TABS: 5.0000 mg | ORAL_TABLET | Freq: Four times a day (QID) | ORAL | Status: DC | PRN

## 2020-11-18 MED ORDER — SODIUM CHLORIDE 0.9 % IV SOLN
2.0000 g | Freq: Three times a day (TID) | INTRAVENOUS | Status: DC
  Administered 2020-11-18 – 2020-11-19 (×3): 2 g via INTRAVENOUS
  Filled 2020-11-18 (×3): qty 2

## 2020-11-18 MED ORDER — SODIUM CHLORIDE 0.9% FLUSH
3.0000 mL | Freq: Two times a day (BID) | INTRAVENOUS | Status: DC
  Administered 2020-11-19 – 2020-11-28 (×13): 3 mL via INTRAVENOUS

## 2020-11-18 MED ORDER — MIDAZOLAM HCL 2 MG/2ML IJ SOLN
INTRAMUSCULAR | Status: AC
  Filled 2020-11-18: qty 2

## 2020-11-18 MED ORDER — POLYETHYLENE GLYCOL 3350 17 G PO PACK
17.0000 g | PACK | Freq: Every day | ORAL | Status: DC | PRN
Start: 2020-11-18 — End: 2020-11-26

## 2020-11-18 MED ORDER — THROMBIN 5000 UNITS EX SOLR
CUTANEOUS | Status: AC
  Filled 2020-11-18: qty 15000

## 2020-11-18 MED ORDER — FLEET ENEMA 7-19 GM/118ML RE ENEM: 1.0000 | ENEMA | Freq: Once | RECTAL | Status: DC | PRN

## 2020-11-18 MED ORDER — BUPIVACAINE HCL (PF) 0.25 % IJ SOLN
INTRAMUSCULAR | Status: AC
  Filled 2020-11-18: qty 30

## 2020-11-18 MED ORDER — LIDOCAINE 2% (20 MG/ML) 5 ML SYRINGE
INTRAMUSCULAR | Status: AC
  Filled 2020-11-18: qty 5

## 2020-11-18 MED ORDER — FENTANYL CITRATE (PF) 100 MCG/2ML IJ SOLN
INTRAMUSCULAR | Status: AC
  Filled 2020-11-18: qty 2

## 2020-11-18 SURGICAL SUPPLY — 54 items
ADH SKN CLS APL DERMABOND .7 (GAUZE/BANDAGES/DRESSINGS) ×1
APL SKNCLS STERI-STRIP NONHPOA (GAUZE/BANDAGES/DRESSINGS) ×1
BAG DECANTER FOR FLEXI CONT (MISCELLANEOUS) ×2 IMPLANT
BAND INSRT 18 STRL LF DISP RB (MISCELLANEOUS)
BAND RUBBER #18 3X1/16 STRL (MISCELLANEOUS) IMPLANT
BENZOIN TINCTURE PRP APPL 2/3 (GAUZE/BANDAGES/DRESSINGS) ×2 IMPLANT
BLADE CLIPPER SURG (BLADE) IMPLANT
BUR MATCHSTICK NEURO 3.0 LAGG (BURR) ×2 IMPLANT
CANISTER SUCT 3000ML PPV (MISCELLANEOUS) ×2 IMPLANT
CARTRIDGE OIL MAESTRO DRILL (MISCELLANEOUS) ×1 IMPLANT
CLSR STERI-STRIP ANTIMIC 1/2X4 (GAUZE/BANDAGES/DRESSINGS) ×1 IMPLANT
COVER WAND RF STERILE (DRAPES) ×2 IMPLANT
DERMABOND ADVANCED (GAUZE/BANDAGES/DRESSINGS) ×1
DERMABOND ADVANCED .7 DNX12 (GAUZE/BANDAGES/DRESSINGS) ×1 IMPLANT
DIFFUSER DRILL AIR PNEUMATIC (MISCELLANEOUS) ×2 IMPLANT
DRAPE LAPAROTOMY 100X72 PEDS (DRAPES) ×2 IMPLANT
DRAPE MICROSCOPE LEICA (MISCELLANEOUS) IMPLANT
DRSG OPSITE POSTOP 4X8 (GAUZE/BANDAGES/DRESSINGS) ×2 IMPLANT
DURAPREP 26ML APPLICATOR (WOUND CARE) ×2 IMPLANT
ELECT REM PT RETURN 9FT ADLT (ELECTROSURGICAL) ×2
ELECTRODE REM PT RTRN 9FT ADLT (ELECTROSURGICAL) ×1 IMPLANT
GAUZE 4X4 16PLY RFD (DISPOSABLE) IMPLANT
GAUZE SPONGE 4X4 12PLY STRL (GAUZE/BANDAGES/DRESSINGS) ×2 IMPLANT
GLOVE BIOGEL PI ORTHO PRO SZ7 (GLOVE) ×1
GLOVE ECLIPSE 9.0 STRL (GLOVE) ×2 IMPLANT
GLOVE EXAM NITRILE XL STR (GLOVE) IMPLANT
GLOVE PI ORTHO PRO STRL SZ7 (GLOVE) ×1 IMPLANT
GLOVE PI ORTHOPRO 6.5 (GLOVE) ×1
GLOVE PI ORTHOPRO STRL 6.5 (GLOVE) IMPLANT
GOWN STRL REUS W/ TWL LRG LVL3 (GOWN DISPOSABLE) IMPLANT
GOWN STRL REUS W/ TWL XL LVL3 (GOWN DISPOSABLE) ×1 IMPLANT
GOWN STRL REUS W/TWL 2XL LVL3 (GOWN DISPOSABLE) IMPLANT
GOWN STRL REUS W/TWL LRG LVL3 (GOWN DISPOSABLE) ×4
GOWN STRL REUS W/TWL XL LVL3 (GOWN DISPOSABLE) ×4
KIT BASIN OR (CUSTOM PROCEDURE TRAY) ×2 IMPLANT
KIT TURNOVER KIT B (KITS) ×2 IMPLANT
NDL SPNL 22GX3.5 QUINCKE BK (NEEDLE) ×1 IMPLANT
NEEDLE HYPO 22GX1.5 SAFETY (NEEDLE) ×2 IMPLANT
NEEDLE SPNL 22GX3.5 QUINCKE BK (NEEDLE) ×2 IMPLANT
NS IRRIG 1000ML POUR BTL (IV SOLUTION) ×2 IMPLANT
OIL CARTRIDGE MAESTRO DRILL (MISCELLANEOUS) ×2
PACK LAMINECTOMY NEURO (CUSTOM PROCEDURE TRAY) ×2 IMPLANT
PAD ARMBOARD 7.5X6 YLW CONV (MISCELLANEOUS) ×6 IMPLANT
PIN MAYFIELD SKULL DISP (PIN) ×2 IMPLANT
SPONGE LAP 4X18 RFD (DISPOSABLE) IMPLANT
SPONGE SURGIFOAM ABS GEL SZ50 (HEMOSTASIS) ×2 IMPLANT
STRIP CLOSURE SKIN 1/2X4 (GAUZE/BANDAGES/DRESSINGS) ×2 IMPLANT
SUT VIC AB 0 CT1 18XCR BRD8 (SUTURE) ×1 IMPLANT
SUT VIC AB 0 CT1 8-18 (SUTURE) ×4
SUT VIC AB 2-0 CT1 18 (SUTURE) ×2 IMPLANT
SUT VIC AB 3-0 SH 8-18 (SUTURE) ×3 IMPLANT
TOWEL GREEN STERILE (TOWEL DISPOSABLE) ×2 IMPLANT
TOWEL GREEN STERILE FF (TOWEL DISPOSABLE) ×2 IMPLANT
WATER STERILE IRR 1000ML POUR (IV SOLUTION) ×2 IMPLANT

## 2020-11-18 NOTE — Progress Notes (Addendum)
eLink Physician-Brief Progress Note Patient Name: Daniel Cline DOB: [birth year] MRN: [id number]   Date of Service  11/18/2020  HPI/Events of Note  Patient is s/p laminectomy and evacuation of cervical epidural abscess, he has post operative hypertension with latest BP of 200/121, he has a swollen erythematous right forearm, and a slightly elevated anion gap at 17, beta-hydroxybutyrate level is pending. He is shivering.  eICU Interventions  New patient evaluation completed, iv fluid bolus held until blood pressure is better controlled, PRN Labetalol ordered for hypertension, arterial line ordered for more reliable blood pressure management, morphine 1-2 mg iv Q 4 hours PRN pain (and shivering), right upper extremity ultrasound ordered to r/o DVT. Toradol 15 mg iv x 1 given for pain.        [name] 11/18/2020, 10:19 PM

## 2020-11-18 NOTE — Op Note (Signed)
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  Asst. Surgeon: Doran Durand, NP  Anesthesia: General  Indication: 65 year old male with severe neck pain and progressive upper and lower extremity weakness with examination consistent with severe cervical myelopathy.  Work-up demonstrates evidence of a extreme dorsal paraspinal abscess with communication into the epidural space throughout his posterior cervical region but centered over the C4-5 level.  Patient with no history of injections or IV drug use.  Patient with poorly controlled diabetes mellitus and overall poor health.  Operative note: After induction of anesthesia, patient positioned prone onto bolsters with his head fixed in [place] pin headrest.  Patient's posterior cervical region prepped and draped sterilely.  Incision made from C2-C7.  Dissection performed bilaterally.  Upon incising the dorsal fascia and dissecting the paraspinous muscles pus under pressure was encountered.  Cultures were taken.  The posterior cervical spine from C2-C7 was exposed.  Retractors were placed.  Bony landmarks were used to confirm the proper levels.  Decompressive laminectomies were then performed using Leksell rongeurs and Kerrison rongeurs to remove the entire lamina of C7 C6 C5 C4 C3 and this inferior aspect of the C2 lamina.  Ligamentum flavum was elevated and resected.  Copious amounts of epidural pus was evacuated.  Some pus was allowed to drain from the lateral gutters consistent with communication with the ventral epidural space.  There was no evidence of injury to the thecal sac or nerve roots.  The wound was copiously irrigated.  Abscess was drained from the paraspinal  muscles.  A medium Hemovac drain was left in the epidural space.  Vancomycin powder was placed in the deep wound space.  Wound is then closed in layers with Vicryl sutures.  Steri-Strips and sterile dressing were applied.  No apparent complications.  Patient tolerated the procedure well and he returns to the intensive care unit in critical condition.
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
## 2020-11-18 NOTE — Anesthesia Procedure Notes (Signed)
Procedure Name: Intubation Date/Time: 11/18/2020 6:42 PM Performed by: Rande Brunt, CRNA Pre-anesthesia Checklist: Patient identified, Emergency Drugs available, Suction available, Timeout performed and Patient being monitored Patient Re-evaluated:Patient Re-evaluated prior to induction Oxygen Delivery Method: Circle system utilized Preoxygenation: Pre-oxygenation with 100% oxygen Induction Type: IV induction and Rapid sequence Laryngoscope Size: Mac and 4 Grade View: Grade I Tube type: Oral Tube size: 7.5 mm Number of attempts: 1 Airway Equipment and Method: Stylet Placement Confirmation: ETT inserted through vocal cords under direct vision,  positive ETCO2 and breath sounds checked- equal and bilateral Secured at: 22 cm Tube secured with: Tape Dental Injury: Teeth and Oropharynx as per pre-operative assessment

## 2020-11-18 NOTE — Progress Notes (Signed)
Orthopedic Tech Progress Note Patient Details:  Daniel Cline 21-Jul-1956 [id number] Soft Collar was delivered NOT applied Ortho Devices Type of Ortho Device: Soft collar Ortho Device/Splint Location: Neck Ortho Device/Splint Interventions: Ordered       Genelle Bal Maiana Hennigan 11/18/2020, 9:47 PM

## 2020-11-18 NOTE — Progress Notes (Signed)
Pharmacy Antibiotic Note  Daniel Cline is a 65 y.o. male admitted on 11/18/2020 with epidural abscess.  Pharmacy has been consulted for vancomycin and cefepime dosing. He is s/p emergent partial C2, C3-7 laminectomy and evacuation of epidural abscess. He had cefazolin 2 g IV pre-op at 19:10. Renal function is normal, WBC elevated at 36.6, and afebrile.  Plan: Vancomycin 1750 mg IV load then 1000 mg IV q12h Goal AUC 400-550. Expected AUC: 450.8 SCr used: 0.8 Cefepime 2 g IV q8h Monitor renal function, clinical progress, cultures/sensitivities F/U LOT and de-escalate as able Vancomycin levels as clinically indicated   Height: 5\' 6"  (167.6 cm) Weight: 83.9 kg (185 lb) IBW/kg (Calculated) : 63.8  Temp (24hrs), Avg:98 F (36.7 C), Min:97.9 F (36.6 C), Max:98 F (36.7 C)  Recent Labs  Lab 11/18/20 1728 11/18/20 1902  WBC 36.6*  --   CREATININE 1.02 0.60*    Estimated Creatinine Clearance: 93.5 mL/min (A) (by C-G formula based on SCr of 0.6 mg/dL (L)).    No Known Allergies  Antimicrobials this admission: cefazolin x1 3/23 vancomycin 3/23 >>  cefepime 3/23 >>  Dose adjustments this admission: n/a  Microbiology results: 3/23 Abscess: 3/23 BCx:   Thank you for involving pharmacy in this patient's care.  4/23, PharmD, BCPS Clinical Pharmacist Clinical phone for 11/18/2020 until 10p is [phone] 11/18/2020 9:41 PM  **Pharmacist phone directory can be found on [email or web address] listed under [facility]**

## 2020-11-18 NOTE — Progress Notes (Signed)
Anesthesia made aware of blood glucose 378 on BMET. Endotool ordered. CRNA made aware and to start in OR.

## 2020-11-18 NOTE — Transfer of Care (Signed)
Immediate Anesthesia Transfer of Care Note  Patient: Daniel Cline  Procedure(s) Performed: CERVICAL THREE-FOUR, CERVICAL FOUR-FIVE, CERVICAL FIVE-SIX, CERVICAL SIX-SEVEN CERVICAL LAMINECTOMY FOR EVACUATION OF ABSCESS (N/A Neck)  Patient Location: PACU  Anesthesia Type:General  Level of Consciousness: drowsy  Airway & Oxygen Therapy: Patient Spontanous Breathing and Patient connected to face mask oxygen  Post-op Assessment: Report given to RN and Post -op Vital signs reviewed and stable  Post vital signs: Reviewed and stable  Last Vitals:  Vitals Value Taken Time  BP 163/80   Temp    Pulse 99 11/18/20 2030  Resp 11 11/18/20 2030  SpO2 97 % 11/18/20 2030  Vitals shown include unvalidated device data.  Last Pain:  Vitals:   11/18/20 1715  TempSrc:   PainSc: 7       Patients Stated Pain Goal: 5 (11/18/20 1715)  Complications: No complications documented.

## 2020-11-18 NOTE — Anesthesia Preprocedure Evaluation (Addendum)
Anesthesia Evaluation  Patient identified by MRN, date of birth, ID band Patient awake    Reviewed: Allergy & Precautions, NPO status , Patient's Chart, lab work & pertinent test results  Airway Mallampati: III  TM Distance: >3 FB Neck ROM: Limited    Dental  (+) Missing, Poor Dentition, Loose   Pulmonary neg pulmonary ROS,    breath sounds clear to auscultation       Cardiovascular hypertension, Pt. on home beta blockers and Pt. on medications  Rhythm:Regular Rate:Tachycardia     Neuro/Psych negative neurological ROS     GI/Hepatic negative GI ROS, Neg liver ROS,   Endo/Other  diabetes, Type 2  Renal/GU negative Renal ROS     Musculoskeletal   Abdominal   Peds  Hematology negative hematology ROS (+)   Anesthesia Other Findings   Reproductive/Obstetrics                            Anesthesia Physical Anesthesia Plan  ASA: III  Anesthesia Plan: General   Post-op Pain Management:    Induction: Intravenous  PONV Risk Score and Plan: 2 and Dexamethasone, Ondansetron and Treatment may vary due to age or medical condition  Airway Management Planned: Oral ETT and Video Laryngoscope Planned  Additional Equipment: None  Intra-op Plan:   Post-operative Plan: Extubation in OR  Informed Consent: I have reviewed the patients History and Physical, chart, labs and discussed the procedure including the risks, benefits and alternatives for the proposed anesthesia with the patient or authorized representative who has indicated his/her understanding and acceptance.     Dental advisory given  Plan Discussed with: CRNA  Anesthesia Plan Comments:         Anesthesia Quick Evaluation

## 2020-11-18 NOTE — Consult Note (Addendum)
NAME:  Daniel Cline, MRN:  [id number], DOB:  01-18-1956, LOS: 0 ADMISSION DATE:  11/18/2020, CONSULTATION DATE:  11/18/20 REFERRING MD:  Jordan Likes, CHIEF COMPLAINT:  Neck pain  Brief History   64yM with DM found to have cervical epidural abscess s/p laminectomy/abscess evacuation 11/18/20  History of present illness   64yM with DM who presented today to Neurosurgery clinic with severe neck pain with weakness in LUE. Much of history is obtained through chart review as he is drowsy at atime of my evaluation in PACU. He had a fall a week PTA and since then ahd increasing neck pain and weakness in LUE which has gradually worsened.   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 He underwent laminectomy/abscess evacuation today.   Past Medical History  DM  Significant Hospital Events   11/18/20 aminectomy/abscess evacuation  Consults:  PCCM  Procedures:  11/18/20 aminectomy/abscess evacuation  Significant Diagnostic Tests:  MRI 11/18/20: paraspinal loculated fluid collections c1-c7 communicating with extensive epidural collection from c2 to upper t-spine, mass effect on cord c5-c7  Micro Data:  11/18/20 BCx not yet collected 11/18/20 intra-op culture in process  Antimicrobials:  Vanc 3/23- Cefepime 3/23-  Interim history/subjective:  n/a  Objective   Blood pressure (!) 161/96, pulse (!) 115, temperature 97.9 F (36.6 C), temperature source Oral, resp. rate (!) 24, height 5\' 6"  (1.676 m), weight 83.9 kg, SpO2 97 %.        Intake/Output Summary (Last 24 hours) at 11/18/2020 2026 Last data filed at 11/18/2020 2022 Gross per 24 hour  Intake 800 ml  Output 75 ml  Net 725 ml   Filed Weights   11/18/20 1728  Weight: 83.9 kg    Examination: General: drowsy, oriented x1 HENT: NCAT, dry MM Lungs: CTAB, diminished, normal work of breathing Cardiovascular: tachycardic, no murmur, no JVD   Abdomen: soft, nontender, normal bowel sounds Extremities: warm, well-perfused without cyanosis, edema Neuro: grossly nonfocal, follows commands  Resolved Hospital Problem list   n/a  Assessment & Plan:   # Sepsis due to epidural abcess s/p laminectomy/abscess evacuation: - follow wound cultures, BCx - empiric vanc/cefepime, narrow as able - would give a liter of fluid, reassess  - may need MR t-spine at some point depending on primary team's intra-op assessment  - TTE - neuro checks per neurosurgery   # DM:  - check another BMP before midnight along with Mg, Phos and then BMP at least q6 for now - 140-180 protocol insulin gtt ok for now - AG is elevated, ordered serum BHB, if significantly greater than 3 then may need to switch to DKA protocol insulin gtt and fluid depending on trajectory of anion gap, glucose  # HTN: - hold home metoprolol at least until passes bedside swallow - ensure adequate pain control  - after ensuring pain under adequate control can use labetalol prn to keep SBP <180 overnight   Best practice:  Diet: will need bedside swallow assessment in AM, pretty groggy currenlty in PACU Pain/Anxiety/Delirium protocol (if indicated): no VAP protocol (if indicated): no DVT prophylaxis: when acceptable from NSGY standpoint, SCDs for now GI prophylaxis: not indicated Glucose control: insulin gtt fo rnow Mobility: bed level Code Status: Full  Family Communication: updated by primary team at bedside Disposition: MICU  Labs   CBC: Recent Labs  Lab 11/18/20 1728  WBC 36.6*  HGB 15.3  HCT 44.6  MCV 91.2  PLT 427*  Basic Metabolic Panel: Recent Labs  Lab 11/18/20 1728  NA 134*  K 3.6  CL 93*  CO2 24  GLUCOSE 378*  BUN 28*  CREATININE 1.02  CALCIUM 8.9   GFR: Estimated Creatinine Clearance: 73.3 mL/min (by C-G formula based on SCr of 1.02 mg/dL). Recent Labs  Lab 11/18/20 1728  WBC 36.6*    Liver Function Tests: No results for input(s):  AST, ALT, ALKPHOS, BILITOT, PROT, ALBUMIN in the last 168 hours. No results for input(s): LIPASE, AMYLASE in the last 168 hours. No results for input(s): AMMONIA in the last 168 hours.  ABG    Component Value Date/Time   TCO2 26 08/24/2018 1149     Coagulation Profile: No results for input(s): INR, PROTIME in the last 168 hours.  Cardiac Enzymes: No results for input(s): CKTOTAL, CKMB, CKMBINDEX, TROPONINI in the last 168 hours.  HbA1C: No results found for: HGBA1C  CBG: No results for input(s): GLUCAP in the last 168 hours.  Review of Systems:   unable to obtain in setting drowsiness immediately post-op  Past Medical History  He,  has a past medical history of Diabetes mellitus without complication (HCC).   Surgical History   History reviewed. No pertinent surgical history.   Social History   reports that he has never smoked. He has never used smokeless tobacco. He reports current alcohol use. He reports that he does not use drugs.   Family History   His family history is not on file.   Allergies No Known Allergies   Home Medications  Prior to Admission medications   Medication Sig Start Date End Date Taking? Authorizing Provider  diphenhydrAMINE (BENADRYL) 25 MG tablet Take 25 mg by mouth every 6 (six) hours as needed for allergies.   Yes [provider]  HYDROcodone-acetaminophen (NORCO/VICODIN) 5-325 MG tablet Take 1 tablet by mouth every 6 (six) hours as needed. Patient taking differently: Take 1 tablet by mouth every 6 (six) hours as needed for moderate pain. 11/09/20  Yes Wynetta Fines, MD  metFORMIN (GLUCOPHAGE) 1000 MG tablet Take 1,000 mg by mouth 2 (two) times daily. 11/12/20  Yes [provider]  metoprolol tartrate (LOPRESSOR) 50 MG tablet Take 50 mg by mouth 2 (two) times daily. 11/12/20  Yes [provider]  oxyCODONE (OXY IR/ROXICODONE) 5 MG immediate release tablet Take 5 mg by mouth 3 (three) times daily as needed for  moderate pain. 11/12/20  Yes [provider]         Vida Roller, Pulmonary/Critical Care

## 2020-11-18 NOTE — Procedures (Signed)
Arterial Catheter Insertion Procedure Note  Daniel Cline  [id number]  1955/10/21  Date:11/18/20  Time:11:50 PM    Provider Performing: Milta Deiters    Procedure: Insertion of Arterial Line ([id number]) without US guidance  Indication(s) Blood pressure monitoring and/or need for frequent ABGs  Consent Risks of the procedure as well as the alternatives and risks of each were explained to the patient and/or caregiver.  Consent for the procedure was obtained and is signed in the bedside chart  Anesthesia None   Time Out Verified patient identification, verified procedure, site/side was marked, verified correct patient position, special equipment/implants available, medications/allergies/relevant history reviewed, required imaging and test results available.   Sterile Technique Maximal sterile technique including full sterile barrier drape, hand hygiene, sterile gown, sterile gloves, mask, hair covering, sterile ultrasound probe cover (if used).   Procedure Description Area of catheter insertion was cleaned with chlorhexidine and draped in sterile fashion. With real-time ultrasound guidance an arterial catheter was placed into the left radial artery.  Appropriate arterial tracings confirmed on monitor.     Complications/Tolerance None; patient tolerated the procedure well.   EBL Minimal   Specimen(s) None

## 2020-11-18 NOTE — Brief Op Note (Signed)
11/18/2020  8:05 PM  PATIENT:  Daniel Cline  65 y.o. male  PRE-OPERATIVE DIAGNOSIS:  cervical abscess  POST-OPERATIVE DIAGNOSIS:  cervical abscess  PROCEDURE:  Procedure(s): CERVICAL THREE-FOUR, CERVICAL FOUR-FIVE, CERVICAL FIVE-SIX, CERVICAL SIX-SEVEN CERVICAL LAMINECTOMY FOR EVACUATION OF ABSCESS (N/A)  SURGEON:  Surgeon(s) and Role:    * Julio Sicks, MD - Primary  PHYSICIAN ASSISTANT:   ASSISTANTSMarland Mcalpine   ANESTHESIA:   general  EBL:  75 mL   BLOOD ADMINISTERED:none  DRAINS: (med) Hemovact drain(s) in the deep wound space with  Suction Open   LOCAL MEDICATIONS USED:  NONE  SPECIMEN:  No Specimen  DISPOSITION OF SPECIMEN:  N/A  COUNTS:  YES  TOURNIQUET:  * No tourniquets in log *  DICTATION: .Dragon Dictation  PLAN OF CARE: Admit to inpatient   PATIENT DISPOSITION:  PACU - hemodynamically stable.   Delay start of Pharmacological VTE agent (>24hrs) due to surgical blood loss or risk of bleeding: yes

## 2020-11-19 ENCOUNTER — Inpatient Hospital Stay (HOSPITAL_COMMUNITY): Payer: Medicare Other

## 2020-11-19 ENCOUNTER — Encounter (HOSPITAL_COMMUNITY): Payer: Self-pay | Admitting: Neurosurgery

## 2020-11-19 DIAGNOSIS — E1165 Type 2 diabetes mellitus with hyperglycemia: Secondary | ICD-10-CM

## 2020-11-19 DIAGNOSIS — L539 Erythematous condition, unspecified: Secondary | ICD-10-CM

## 2020-11-19 DIAGNOSIS — E1159 Type 2 diabetes mellitus with other circulatory complications: Secondary | ICD-10-CM

## 2020-11-19 DIAGNOSIS — I152 Hypertension secondary to endocrine disorders: Secondary | ICD-10-CM

## 2020-11-19 DIAGNOSIS — G061 Intraspinal abscess and granuloma: Secondary | ICD-10-CM

## 2020-11-19 DIAGNOSIS — L8990 Pressure ulcer of unspecified site, unspecified stage: Secondary | ICD-10-CM | POA: Diagnosis present

## 2020-11-19 DIAGNOSIS — M79601 Pain in right arm: Secondary | ICD-10-CM

## 2020-11-19 DIAGNOSIS — M7989 Other specified soft tissue disorders: Secondary | ICD-10-CM

## 2020-11-19 LAB — MRSA PCR SCREENING: MRSA by PCR: NEGATIVE

## 2020-11-19 LAB — CBC
HCT: 38 % — ABNORMAL LOW (ref 39.0–52.0)
Hemoglobin: 12.9 g/dL — ABNORMAL LOW (ref 13.0–17.0)
MCH: 31.4 pg (ref 26.0–34.0)
MCHC: 33.9 g/dL (ref 30.0–36.0)
MCV: 92.5 fL (ref 80.0–100.0)
Platelets: 377 10*3/uL (ref 150–400)
RBC: 4.11 MIL/uL — ABNORMAL LOW (ref 4.22–5.81)
RDW: 12.7 % (ref 11.5–15.5)
WBC: 39.6 10*3/uL — ABNORMAL HIGH (ref 4.0–10.5)
nRBC: 0 % (ref 0.0–0.2)

## 2020-11-19 LAB — HEMOGLOBIN A1C
Hgb A1c MFr Bld: 13.9 % — ABNORMAL HIGH (ref 4.8–5.6)
Mean Plasma Glucose: 352.23 mg/dL

## 2020-11-19 LAB — BASIC METABOLIC PANEL
Anion gap: 8 (ref 5–15)
Anion gap: 11 (ref 5–15)
BUN: 22 mg/dL (ref 8–23)
BUN: 21 mg/dL (ref 8–23)
CO2: 26 mmol/L (ref 22–32)
CO2: 24 mmol/L (ref 22–32)
Calcium: 8.1 mg/dL — ABNORMAL LOW (ref 8.9–10.3)
Calcium: 8.2 mg/dL — ABNORMAL LOW (ref 8.9–10.3)
Chloride: 102 mmol/L (ref 98–111)
Chloride: 102 mmol/L (ref 98–111)
Creatinine, Ser: 0.69 mg/dL (ref 0.61–1.24)
Creatinine, Ser: 0.74 mg/dL (ref 0.61–1.24)
GFR, Estimated: >60 mL/min (ref >60)
GFR, Estimated: >60 mL/min (ref >60)
Glucose, Bld: 123 mg/dL — ABNORMAL HIGH (ref 70–99)
Glucose, Bld: 167 mg/dL — ABNORMAL HIGH (ref 70–99)
Potassium: 3.2 mmol/L — ABNORMAL LOW (ref 3.5–5.1)
Potassium: 3.3 mmol/L — ABNORMAL LOW (ref 3.5–5.1)
Sodium: 136 mmol/L (ref 135–145)
Sodium: 137 mmol/L (ref 135–145)

## 2020-11-19 LAB — GLUCOSE, CAPILLARY
Glucose-Capillary: 119 mg/dL — ABNORMAL HIGH (ref 70–99)
Glucose-Capillary: 122 mg/dL — ABNORMAL HIGH (ref 70–99)
Glucose-Capillary: 123 mg/dL — ABNORMAL HIGH (ref 70–99)
Glucose-Capillary: 124 mg/dL — ABNORMAL HIGH (ref 70–99)
Glucose-Capillary: 171 mg/dL — ABNORMAL HIGH (ref 70–99)
Glucose-Capillary: 261 mg/dL — ABNORMAL HIGH (ref 70–99)
Glucose-Capillary: 262 mg/dL — ABNORMAL HIGH (ref 70–99)

## 2020-11-19 IMAGING — DX DG CHEST 1V PORT
1 series · 1 of 1 positions shown · non-contrast
Comparison: None.

CLINICAL DATA: Hypoxia

EXAM:
PORTABLE CHEST 1 VIEW

[chest ap]
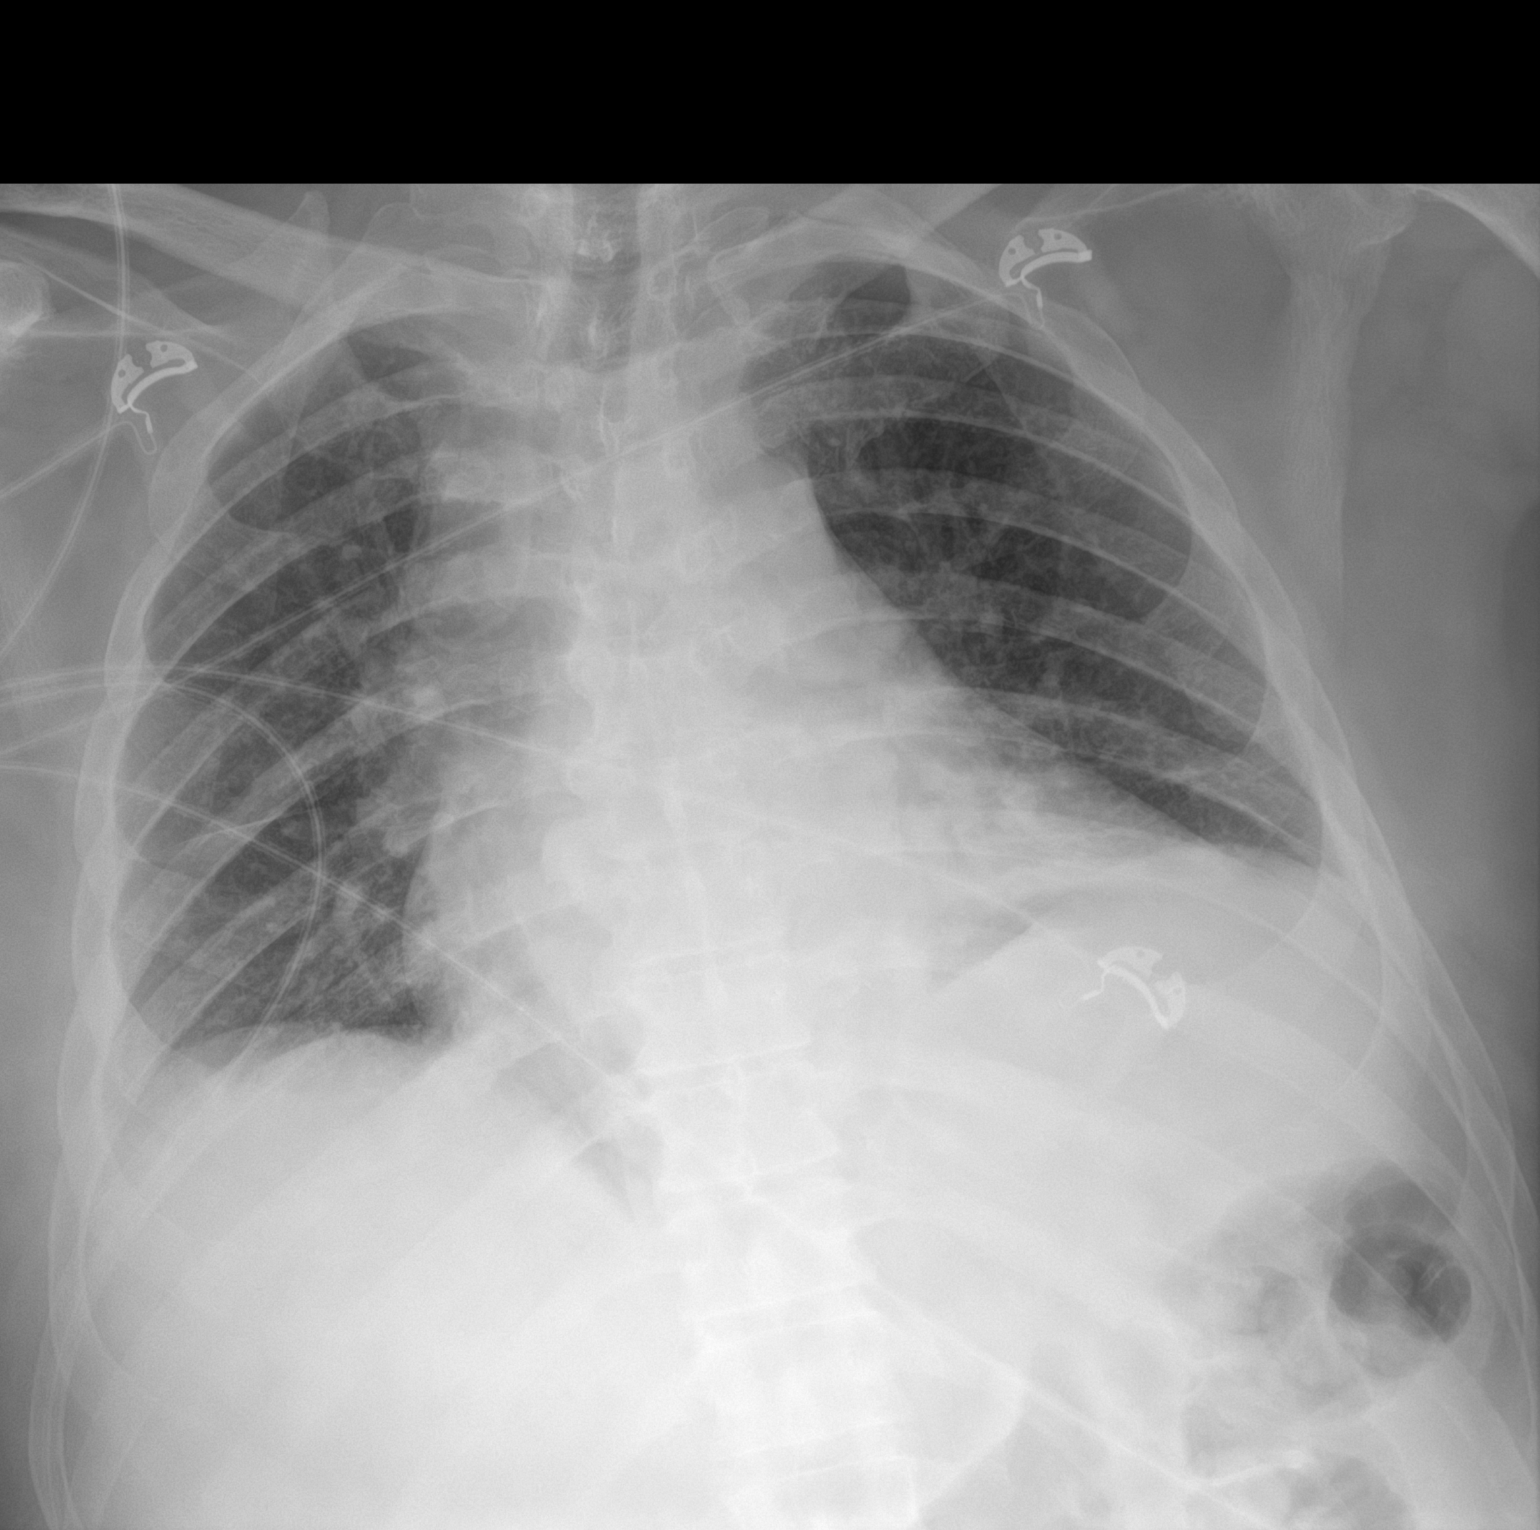

[1 of 1 positions shown; findings below may reference images not displayed]

FINDINGS: Lung volumes are low. Some streaky opacities favor atelectasis. More
dense airspace opacities noted in the retrocardiac space. No visible
pneumothorax. Suspect a small right pleural effusion. No visible
left effusion. Cardiac silhouette is enlarged though may be
accentuated by portable technique. Telemetry leads overlie the
chest. No acute osseous or soft tissue abnormality. Degenerative
changes are present in the imaged spine and shoulders.
Dextrocurvature of the spine.
IMPRESSION: 1. Low lung volumes with bibasilar atelectasis
2. More dense airspace opacity in the retrocardiac space, could
reflect pneumonia or further volume loss.
3. Small right pleural effusion.
4. Suspect cardiomegaly though may be accentuated by portable
technique.

## 2020-11-19 MED ORDER — METOPROLOL TARTRATE 5 MG/5ML IV SOLN: 5.0000 mg | INTRAVENOUS | Status: DC

## 2020-11-19 MED ORDER — INSULIN ASPART 100 UNIT/ML ~~LOC~~ SOLN
0.0000 [IU] | Freq: Three times a day (TID) | SUBCUTANEOUS | Status: DC
  Administered 2020-11-20 (×3): 5 [IU] via SUBCUTANEOUS
  Administered 2020-11-21: 3 [IU] via SUBCUTANEOUS
  Administered 2020-11-21: 2 [IU] via SUBCUTANEOUS
  Administered 2020-11-21: 3 [IU] via SUBCUTANEOUS
  Administered 2020-11-23: 2 [IU] via SUBCUTANEOUS
  Administered 2020-11-23: 5 [IU] via SUBCUTANEOUS
  Administered 2020-11-24: 2 [IU] via SUBCUTANEOUS
  Administered 2020-11-24: 3 [IU] via SUBCUTANEOUS
  Administered 2020-11-25: 2 [IU] via SUBCUTANEOUS
  Administered 2020-11-25 – 2020-11-26 (×3): 3 [IU] via SUBCUTANEOUS
  Administered 2020-11-26 (×2): 2 [IU] via SUBCUTANEOUS
  Administered 2020-11-27 (×2): 3 [IU] via SUBCUTANEOUS
  Administered 2020-11-27: 2 [IU] via SUBCUTANEOUS
  Administered 2020-11-28: 3 [IU] via SUBCUTANEOUS

## 2020-11-19 MED ORDER — INSULIN ASPART 100 UNIT/ML ~~LOC~~ SOLN
2.0000 [IU] | SUBCUTANEOUS | Status: DC
  Administered 2020-11-19: 4 [IU] via SUBCUTANEOUS
  Administered 2020-11-19 (×2): 6 [IU] via SUBCUTANEOUS

## 2020-11-19 MED ORDER — PANTOPRAZOLE SODIUM 40 MG PO TBEC
40.0000 mg | DELAYED_RELEASE_TABLET | Freq: Every day | ORAL | Status: DC
  Administered 2020-11-19 – 2020-11-23 (×3): 40 mg via ORAL
  Filled 2020-11-19 (×4): qty 1

## 2020-11-19 MED ORDER — NICARDIPINE HCL IN NACL 20-0.86 MG/200ML-% IV SOLN
0.0000 mg/h | INTRAVENOUS | Status: DC
  Administered 2020-11-19: 11.5 mg/h via INTRAVENOUS
  Administered 2020-11-19: 2 mg/h via INTRAVENOUS
  Administered 2020-11-19: 5 mg/h via INTRAVENOUS
  Administered 2020-11-19: 10 mg/h via INTRAVENOUS
  Administered 2020-11-19: 14 mg/h via INTRAVENOUS
  Administered 2020-11-19: 5 mg/h via INTRAVENOUS
  Administered 2020-11-20 (×4): 7.5 mg/h via INTRAVENOUS
  Administered 2020-11-20: 5 mg/h via INTRAVENOUS
  Administered 2020-11-20: 7.5 mg/h via INTRAVENOUS
  Administered 2020-11-21 (×2): 5 mg/h via INTRAVENOUS
  Filled 2020-11-19 (×15): qty 200

## 2020-11-19 MED ORDER — ZINC OXIDE 40 % EX OINT
TOPICAL_OINTMENT | Freq: Two times a day (BID) | CUTANEOUS | Status: DC
  Filled 2020-11-19 (×3): qty 57

## 2020-11-19 MED ORDER — METOPROLOL TARTRATE 5 MG/5ML IV SOLN
2.5000 mg | Freq: Four times a day (QID) | INTRAVENOUS | Status: DC
  Administered 2020-11-19: 2.5 mg via INTRAVENOUS

## 2020-11-19 MED ORDER — INSULIN DETEMIR 100 UNIT/ML ~~LOC~~ SOLN
10.0000 [IU] | Freq: Two times a day (BID) | SUBCUTANEOUS | Status: DC
  Administered 2020-11-19 – 2020-11-20 (×2): 10 [IU] via SUBCUTANEOUS
  Filled 2020-11-19 (×3): qty 0.1

## 2020-11-19 MED ORDER — INSULIN DETEMIR 100 UNIT/ML ~~LOC~~ SOLN
10.0000 [IU] | Freq: Two times a day (BID) | SUBCUTANEOUS | Status: DC
  Administered 2020-11-19: 10 [IU] via SUBCUTANEOUS
  Filled 2020-11-19 (×3): qty 0.1

## 2020-11-19 MED ORDER — CHLORHEXIDINE GLUCONATE CLOTH 2 % EX PADS
6.0000 | MEDICATED_PAD | Freq: Every day | CUTANEOUS | Status: DC
  Administered 2020-11-18 – 2020-11-28 (×10): 6 via TOPICAL

## 2020-11-19 MED ORDER — METOPROLOL TARTRATE 5 MG/5ML IV SOLN
INTRAVENOUS | Status: AC
  Filled 2020-11-19: qty 5

## 2020-11-19 MED ORDER — INSULIN ASPART 100 UNIT/ML ~~LOC~~ SOLN
0.0000 [IU] | Freq: Every day | SUBCUTANEOUS | Status: DC
  Administered 2020-11-19: 3 [IU] via SUBCUTANEOUS
  Administered 2020-11-22 – 2020-11-23 (×2): 2 [IU] via SUBCUTANEOUS

## 2020-11-19 MED ORDER — POTASSIUM CHLORIDE CRYS ER 20 MEQ PO TBCR
40.0000 meq | EXTENDED_RELEASE_TABLET | ORAL | Status: AC
  Administered 2020-11-19 (×2): 40 meq via ORAL
  Filled 2020-11-19 (×2): qty 2

## 2020-11-19 MED ORDER — METOPROLOL TARTRATE 5 MG/5ML IV SOLN
5.0000 mg | Freq: Four times a day (QID) | INTRAVENOUS | Status: DC
  Administered 2020-11-19: 5 mg via INTRAVENOUS
  Filled 2020-11-19: qty 5

## 2020-11-19 NOTE — Progress Notes (Signed)
eLink Physician-Brief Progress Note Patient Name: CARLITOS BOTTINO III DOB: 11-02-1955 MRN: [id number]   Date of Service  11/19/2020  HPI/Events of Note  BP 199/80, MAP 107, blood pressure comes down below 170 mmHg with PRN Labetalol but goes back up before the two hour window for PRN Labetalol is up.  eICU Interventions  Will schedule Lopressor 2.5 mg iv Q 6 hours, and continue the Labetalol on a PRN basis for SBP > 170 mmHg.        Thomasene Lot Lakara Weiland 11/19/2020, 2:12 AM

## 2020-11-19 NOTE — Consult Note (Signed)
WOC Nurse Consult Note: Patient receiving care in [facility] 3M04. Assisted with turning by primary RN. 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 While assessing the patient, I noted that the existing condom cath is too tight, the glans penis and penile shaft are red, there is smega around the glans penis.  I have entered an order to remove the condom cath and place a Primofit. Pressure Injury POA: Yes/No/NA Measurement: Wound bed: Drainage (amount, consistency, odor)  Periwound: Dressing procedure/placement/frequency: twice daily application of Desitin to the fissure, InterDry to the groin folds, and Primofit urinary collection system. Monitor the wound area(s) for worsening of condition such as: Signs/symptoms of infection,  Increase in size,  Development of or worsening of odor, Development of pain, or increased pain at the affected locations.  Notify the medical team if any of these develop.  Thank you for the consult.  Discussed plan of care with the patient and bedside nurse.  WOC nurse will not follow at this time.  Please re-consult the WOC team if needed.  Helmut Muster, RN, MSN, CWOCN, CNS-BC, pager [phone]

## 2020-11-19 NOTE — Anesthesia Postprocedure Evaluation (Signed)
Anesthesia Post Note  Patient: Daniel Cline  Procedure(s) Performed: CERVICAL THREE-FOUR, CERVICAL FOUR-FIVE, CERVICAL FIVE-SIX, CERVICAL SIX-SEVEN CERVICAL LAMINECTOMY FOR EVACUATION OF ABSCESS (N/A Neck)     Patient location during evaluation: PACU Anesthesia Type: General Level of consciousness: patient cooperative and lethargic Pain management: pain level controlled Vital Signs Assessment: post-procedure vital signs reviewed and stable Respiratory status: spontaneous breathing, nonlabored ventilation, respiratory function stable and patient connected to nasal cannula oxygen Cardiovascular status: stable Postop Assessment: no apparent nausea or vomiting Anesthetic complications: no   No complications documented.  Last Vitals:  Vitals:   11/19/20 1045 11/19/20 1118  BP: (!) 156/75   Pulse: (!) 107   Resp: 18   Temp:  37 C  SpO2: 92%     Last Pain:  Vitals:   11/19/20 1118  TempSrc: Oral  PainSc:                  [name]

## 2020-11-19 NOTE — Progress Notes (Signed)
eLink Physician-Brief Progress Note Patient Name: Daniel Cline DOB: 12/21/1955 MRN: [id number]   Date of Service  11/19/2020  HPI/Events of Note  Patient with persistent hypertension despite increasing doses and frequency of Lopressor dosing.  eICU Interventions  Cardene infusion ordered.        Migdalia Dk 11/19/2020, 6:38 AM
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
## 2020-11-19 NOTE — Progress Notes (Signed)
eLink Physician-Brief Progress Note Patient Name: Daniel Cline DOB: 1956-04-23 MRN: [id number]   Date of Service  11/19/2020  HPI/Events of Note  BP 193/ 85, MAP 109.  eICU Interventions  Scheduled Lopressor increased to 5 mg iv Q 6 hours.        Thomasene Lot Beyla Loney 11/19/2020, 3:22 AM
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
## 2020-11-19 NOTE — Progress Notes (Signed)
Providing Compassionate, Quality Care - Together   Subjective: Patient is very sleepy during this exam. Persistent hypertension overnight, requiring a Cardene drip.  Objective: Vital signs in last 24 hours: Temp:  [97.9 F (36.6 C)-99 F (37.2 C)] 98.6 F (37 C) (03/24 1118) Pulse Rate:  [87-129] 87 (03/24 1200) Resp:  [11-26] 19 (03/24 1200) BP: (121-253)/(62-123) 150/69 (03/24 1200) SpO2:  [91 %-97 %] 92 % (03/24 1200) Arterial Line BP: (126-212)/(45-91) 126/58 (03/24 1200) Weight:  [83.9 kg] 83.9 kg (03/23 1728)  Intake/Output from previous day: 03/23 0701 - 03/24 0700 In: 1882 [I.V.:1279.5; IV Piggyback:602.6] Out: 675 [Urine:600; Blood:75] Intake/Output this shift: Total I/O In: 494.1 [I.V.:494.1] Out: 375 [Urine:375]  Responds to voice, lethargic PERRLA CN II-XII grossly intact MAE, Difficult to gauge strength due to patient's level of sedation Incision is covered with Honeycomb dressing and Steri Strips; Dressing is clean, dry, and intact Hemovac in place/charged 80 mL reported output overnight  Lab Results: Recent Labs    11/18/20 2209 11/19/20 0613  WBC 39.4* 39.6*  HGB 14.8 12.9*  HCT 45.2 38.0*  PLT 414* 377   BMET Recent Labs    11/19/20 0046 11/19/20 0613  NA 136 137  K 3.2* 3.3*  CL 102 102  CO2 26 24  GLUCOSE 123* 167*  BUN 22 21  CREATININE 0.69 0.74  CALCIUM 8.1* 8.2*    Studies/Results: MR CERVICAL SPINE WO CONTRAST  Result Date: 11/18/2020 CLINICAL DATA:  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 EXAM: MRI CERVICAL SPINE WITHOUT CONTRAST TECHNIQUE: Multiplanar, multisequence MR imaging of the cervical spine was performed. No intravenous contrast was administered. COMPARISON:  CT of the cervical spine November 09, 2020. FINDINGS: Alignment: Mild reversal of the cervical curvature. Vertebrae: No fracture, evidence of discitis, or bone lesion. Cord: Mass effect on the cord from C4 through C7. No cord signal abnormality. Posterior  Fossa, vertebral arteries, paraspinal tissues: Prominent multiloculated fluid collections involving the paraspinal musculature from the level of C1 through C7, predominantly on the left with susceptibility artifact noted in the largest pocket within the left semispinalis muscle at the C2 level, likely representing air. This fluid collections communicate with a large epidural fluid collection through the interlaminar space at the C3-4 level. The epidural fluid collection extending from the level of C3 through the visualized upper thoracic spine. There is mass effect on the cord with obliteration of the CSF space at C5-6 and C6-7. Disc levels: C2-3: Small posterior disc protrusion resulting mild spinal canal stenosis. Prominent facet degenerative changes with bilateral joint effusion resulting mild-to-moderate left neural foraminal narrowing. C3-4: Posterior disc protrusion which in association with the epidural collection results in moderate narrowing of the thecal sac. Uncovertebral and facet degenerative changes resulting in severe left neural foraminal narrowing. C4-5: Small posterior disc protrusion which in association with the epidural collection resulting in moderate narrowing of the thecal sac. Mild uncovertebral degenerative change and facet degenerative changes, more pronounced on the left, resulting in mild left neural foraminal narrowing. C5-6: Posterior disc protrusion which in association with the epidural collection resulting in moderate narrowing of the thecal sac. Uncovertebral and facet degenerative changes resulting in severe right and mild left neural foraminal narrowing. C6-7: Posterior disc protrusion which in association with the epidural collection resulting in narrowing of the thecal sac. Uncovertebral and facet degenerative changes result severe left neural foraminal narrowing. C7-T1: Epidural fluid collection resulting in moderate narrowing of the thecal sac. Uncovertebral and facet  degenerative changes resulting in mild left  neural foraminal narrowing. IMPRESSION: 1. Prominent multiloculated fluid collections involving the paraspinal musculature from the level of C1 through C7. This collection communicates with and extensive epidural fluid collection through the interlaminar space at the C3-4 level. The epidural collection extends from C2 through the visualized upper thoracic spine. 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 Recommend MRI of the thoracic spine without and with contrast to evaluate for disease extension. 2. There is mass effect on the cord with obliteration of the CSF space at C5-6 and C6-7. 3. Multilevel degenerative changes of the cervical spine, as described above. These results were called by telephone at the time of interpretation on 11/18/2020 at 4:03 pm to Dr. Julio Sicks , who verbally acknowledged these results. Electronically Signed   By: Baldemar Lenis M.D.   On: 11/18/2020 16:05   DG CHEST PORT 1 VIEW  Result Date: 11/19/2020 CLINICAL DATA:  Hypoxia EXAM: PORTABLE CHEST 1 VIEW COMPARISON:  None. FINDINGS: Lung volumes are low. Some streaky opacities favor atelectasis. More dense airspace opacities noted in the retrocardiac space. No visible pneumothorax. Suspect a small right pleural effusion. No visible left effusion. Cardiac silhouette is enlarged though may be accentuated by portable technique. Telemetry leads overlie the chest. No acute osseous or soft tissue abnormality. Degenerative changes are present in the imaged spine and shoulders. Dextrocurvature of the spine. IMPRESSION: 1. Low lung volumes with bibasilar atelectasis 2. More dense airspace opacity in the retrocardiac space, could reflect pneumonia or further volume loss. 3. Small right pleural effusion. 4. Suspect cardiomegaly though may be accentuated by portable technique. Electronically Signed   By: Kreg Shropshire M.D.   On: 11/19/2020 01:15   VAS [place] UPPER  EXTREMITY VENOUS DUPLEX  Result Date: 11/19/2020 UPPER VENOUS STUDY  Indications: Pain, Swelling, and Erythema Risk Factors: None identified. Limitations: Poor ultrasound/tissue interface and patient positioning, patient pain tolerance. Comparison Study: No prior studies. Performing Technologist: Chanda Busing RVT  Examination Guidelines: A complete evaluation includes B-mode imaging, spectral Doppler, color Doppler, and power Doppler as needed of all accessible portions of each vessel. Bilateral testing is considered an integral part of a complete examination. Limited examinations for reoccurring indications may be performed as noted.  Right Findings: +----------+------------+---------+-----------+----------+-------+ RIGHT     CompressiblePhasicitySpontaneousPropertiesSummary +----------+------------+---------+-----------+----------+-------+ IJV           Full       Yes       Yes                      +----------+------------+---------+-----------+----------+-------+ Subclavian    Full       Yes       Yes                      +----------+------------+---------+-----------+----------+-------+ Axillary      Full       Yes       Yes                      +----------+------------+---------+-----------+----------+-------+ Brachial      Full       Yes       Yes                      +----------+------------+---------+-----------+----------+-------+ Radial        Full                                          +----------+------------+---------+-----------+----------+-------+  Ulnar         Full                                          +----------+------------+---------+-----------+----------+-------+ Cephalic      Full                                          +----------+------------+---------+-----------+----------+-------+ Basilic       Full                                          +----------+------------+---------+-----------+----------+-------+  Left  Findings: +----------+------------+---------+-----------+----------+-------+ LEFT      CompressiblePhasicitySpontaneousPropertiesSummary +----------+------------+---------+-----------+----------+-------+ Subclavian    Full       Yes       Yes                      +----------+------------+---------+-----------+----------+-------+  Summary:  Right: No evidence of deep vein thrombosis in the upper extremity. No evidence of superficial vein thrombosis in the upper extremity.  Left: No evidence of thrombosis in the subclavian.  *See table(s) above for measurements and observations.  Diagnosing physician: Sherald Hess MD Electronically signed by Sherald Hess MD on 11/19/2020 at 1:05:37 PM.    Final     Assessment/Plan: Daniel Cline presented to the [facility] office on 11/18/2020 appearing acutely ill, with severe neck pain.  A stat cervical MRI was completed , showing an epidural abscess.  He underwent emergent partial C2 and complete C3, C4, C5, C6, C7 laminectomy with evacuation of epidural abscess on 11/18/2020 by Dr. Jordan Likes. Wound cultures growing gram positive cocci. No growth in blood cultures to date. Daniel Cline  has poorly managed type 2 diabetes, with a hemoglobin A1C of 13.9.  He transitioned off the insulin drip last night and is now on SSI. He became hypertensive overnight, not responding to p.r.n. antihypertensives.  He was started on a Cardene drip.  He also developed acute edema and erythema in his right upper extremity.  A venous ultrasound was performed and DVT has been ruled out.   LOS: 1 day    -CCM consulted for medical management -ID consulted for antibiotic management  -recommending MRI of RUE if swelling  doesn't improve to rule out pyomyositis -Maintain Hemovac drain for now  Val Eagle, DNP, AGNP-C Nurse Practitioner  [facility] [address], [address], [place], [place] [address] P: [phone]    F: [phone]  11/19/2020,  11:35 AM

## 2020-11-19 NOTE — Progress Notes (Signed)
Right upper extremity venous duplex has been completed. Preliminary results can be found in CV Proc through chart review.   11/19/20 11:01 AM Olen Cordial RVT

## 2020-11-19 NOTE — Progress Notes (Signed)
eLink Physician-Brief Progress Note Patient Name: Daniel Cline DOB: 02-Jul-1956 MRN: [id number]   Date of Service  11/19/2020  HPI/Events of Note  Patient's blood pressure is suboptimally controlled.  eICU Interventions  Scheduled iv Lopressor interval shortened to Q 4 hours.        Thomasene Lot Bo Teicher 11/19/2020, 5:45 AM

## 2020-11-19 NOTE — Progress Notes (Signed)
Rehab Admissions Coordinator Note:  Patient was screened by Clois Dupes for appropriateness for an Inpatient Acute Rehab Consult per OT recs. Noted lethargic on eval. I await further tolerance with therapies before placing rehab consult .  Clois Dupes RN MSN 11/19/2020, 12:57 PM  I can be reached at [phone].

## 2020-11-19 NOTE — Progress Notes (Signed)
eLink Physician-Brief Progress Note Patient Name: Daniel Cline DOB: 10/16/1955 MRN: [id number]   Date of Service  11/19/2020  HPI/Events of Note  Patient was on an insulin infusion for DKA but has closed his gap.  eICU Interventions  Hyperglycemia transition protocol orders entered.        Thomasene Lot Casey Fye 11/19/2020, 3:57 AM

## 2020-11-20 ENCOUNTER — Inpatient Hospital Stay (HOSPITAL_COMMUNITY): Payer: Medicare Other | Admitting: Certified Registered Nurse Anesthetist

## 2020-11-20 ENCOUNTER — Inpatient Hospital Stay (HOSPITAL_COMMUNITY): Payer: Medicare Other

## 2020-11-20 ENCOUNTER — Other Ambulatory Visit (HOSPITAL_COMMUNITY): Payer: PRIVATE HEALTH INSURANCE

## 2020-11-20 ENCOUNTER — Encounter (HOSPITAL_COMMUNITY): Payer: Self-pay | Admitting: Neurosurgery

## 2020-11-20 ENCOUNTER — Encounter (HOSPITAL_COMMUNITY)
Admission: RE | Disposition: A | Discharge status: Rehabilitation Facility | Payer: Self-pay | Source: Ambulatory Visit | Attending: Neurosurgery

## 2020-11-20 DIAGNOSIS — I351 Nonrheumatic aortic (valve) insufficiency: Secondary | ICD-10-CM

## 2020-11-20 DIAGNOSIS — A4901 Methicillin susceptible Staphylococcus aureus infection, unspecified site: Secondary | ICD-10-CM

## 2020-11-20 DIAGNOSIS — I340 Nonrheumatic mitral (valve) insufficiency: Secondary | ICD-10-CM

## 2020-11-20 HISTORY — PX: I & D EXTREMITY: SHX5045

## 2020-11-20 LAB — BASIC METABOLIC PANEL
Anion gap: 11 (ref 5–15)
BUN: 18 mg/dL (ref 8–23)
CO2: 20 mmol/L — ABNORMAL LOW (ref 22–32)
Calcium: 8.1 mg/dL — ABNORMAL LOW (ref 8.9–10.3)
Chloride: 101 mmol/L (ref 98–111)
Creatinine, Ser: 0.71 mg/dL (ref 0.61–1.24)
GFR, Estimated: >60 mL/min (ref >60)
Glucose, Bld: 230 mg/dL — ABNORMAL HIGH (ref 70–99)
Potassium: 3.2 mmol/L — ABNORMAL LOW (ref 3.5–5.1)
Sodium: 132 mmol/L — ABNORMAL LOW (ref 135–145)

## 2020-11-20 LAB — CBC
HCT: 36.9 % — ABNORMAL LOW (ref 39.0–52.0)
Hemoglobin: 12.4 g/dL — ABNORMAL LOW (ref 13.0–17.0)
MCH: 31.2 pg (ref 26.0–34.0)
MCHC: 33.6 g/dL (ref 30.0–36.0)
MCV: 92.9 fL (ref 80.0–100.0)
Platelets: 407 10*3/uL — ABNORMAL HIGH (ref 150–400)
RBC: 3.97 MIL/uL — ABNORMAL LOW (ref 4.22–5.81)
RDW: 12.5 % (ref 11.5–15.5)
WBC: 32.3 10*3/uL — ABNORMAL HIGH (ref 4.0–10.5)
nRBC: 0 % (ref 0.0–0.2)

## 2020-11-20 LAB — POCT I-STAT 7, (LYTES, BLD GAS, ICA,H+H)
Acid-base deficit: 3 mmol/L — ABNORMAL HIGH (ref 0.0–2.0)
Bicarbonate: 20.2 mmol/L (ref 20.0–28.0)
Calcium, Ion: 1.16 mmol/L (ref 1.15–1.40)
HCT: 33 % — ABNORMAL LOW (ref 39.0–52.0)
Hemoglobin: 11.2 g/dL — ABNORMAL LOW (ref 13.0–17.0)
O2 Saturation: 99 %
Patient temperature: 98
Potassium: 3.4 mmol/L — ABNORMAL LOW (ref 3.5–5.1)
Sodium: 133 mmol/L — ABNORMAL LOW (ref 135–145)
TCO2: 21 mmol/L — ABNORMAL LOW (ref 22–32)
pCO2 arterial: 28.8 mmHg — ABNORMAL LOW (ref 32.0–48.0)
pH, Arterial: 7.452 — ABNORMAL HIGH (ref 7.350–7.450)
pO2, Arterial: 106 mmHg (ref 83.0–108.0)

## 2020-11-20 LAB — ECHOCARDIOGRAM COMPLETE
AR max vel: 2.54 cm2
AV Area VTI: 2.5 cm2
AV Area mean vel: 2.7 cm2
AV Mean grad: 2 mmHg
AV Peak grad: 3.5 mmHg
Ao pk vel: 0.93 m/s
Area-P 1/2: 6.12 cm2
Height: 66 in
MV M vel: 4.73 m/s
MV Peak grad: 89.5 mmHg
MV VTI: 1.61 cm2
S' Lateral: 4.1 cm
Weight: 2960 oz

## 2020-11-20 LAB — HEPATITIS C ANTIBODY: HCV Ab: NONREACTIVE

## 2020-11-20 LAB — HIV ANTIBODY (ROUTINE TESTING W REFLEX): HIV Screen 4th Generation wRfx: NONREACTIVE

## 2020-11-20 LAB — GLUCOSE, CAPILLARY
Glucose-Capillary: 262 mg/dL — ABNORMAL HIGH (ref 70–99)
Glucose-Capillary: 224 mg/dL — ABNORMAL HIGH (ref 70–99)
Glucose-Capillary: 221 mg/dL — ABNORMAL HIGH (ref 70–99)
Glucose-Capillary: 187 mg/dL — ABNORMAL HIGH (ref 70–99)

## 2020-11-20 IMAGING — CT CT FOREARM*R* W/CM
3 of 4 series · 11 of 35 positions shown, 14 images · IV contrast (omnipaque)
Comparison: None.

CLINICAL DATA: Right forearm pain.  Fever.  Infection suspected

EXAM:
CT OF THE UPPER RIGHT EXTREMITY WITH CONTRAST
TECHNIQUE: Multidetector CT imaging of the upper right extremity was performed
according to the standard protocol following intravenous contrast
administration.
CONTRAST:  100mL OMNIPAQUE IOHEXOL 300 MG/ML  SOLN

[Series 6: 2 1.5 mm st ax · axial · 0.35mm/px · z∈[+898,+1110]mm · 5 of 205 slices shown, 7 images]
[im 32/205  soft-tissue]
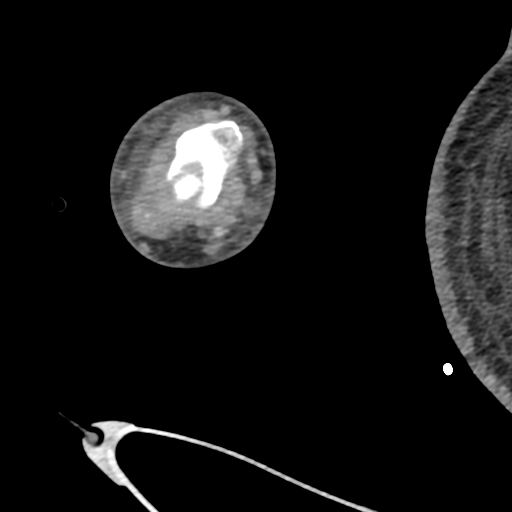
[im 32/205  bone]
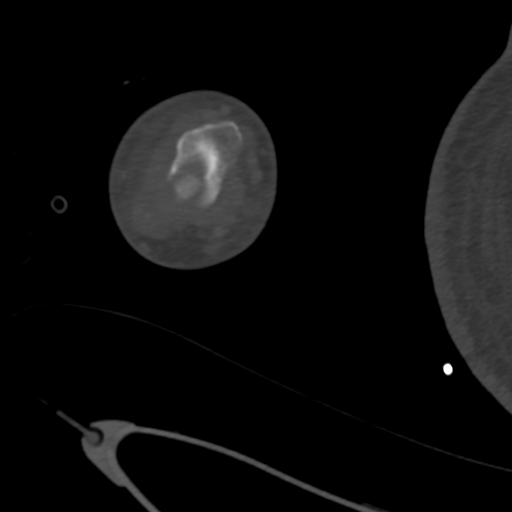
[im 63/205  bone]
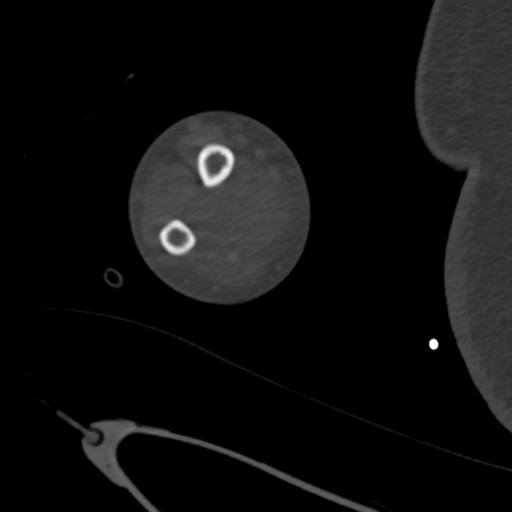
[im 110/205  bone]
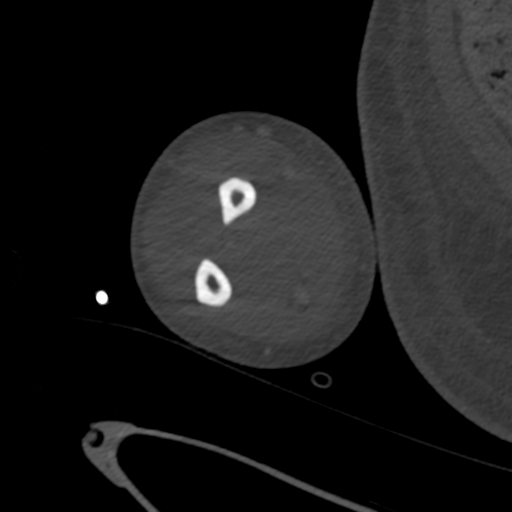
[im 142/205  bone]
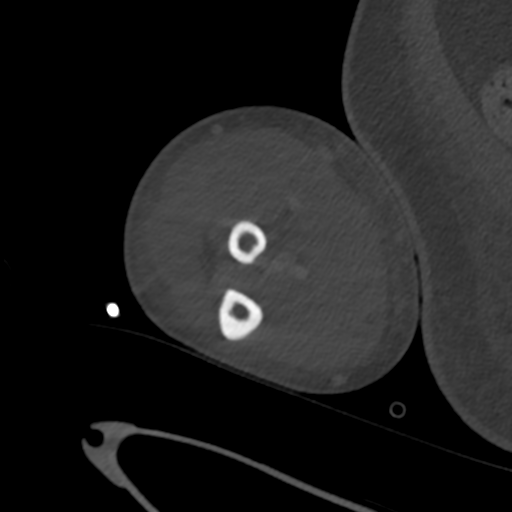
[im 173/205  soft-tissue]
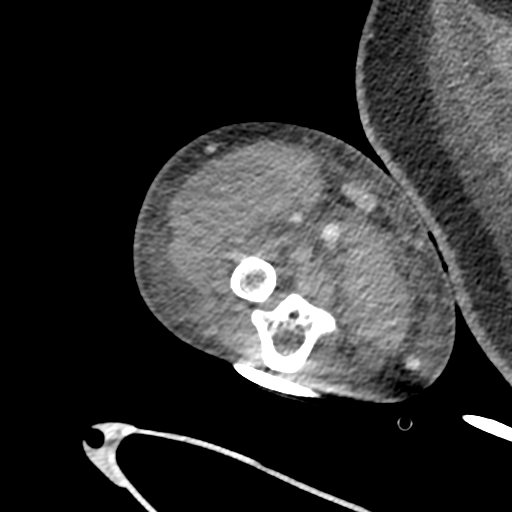
[im 173/205  bone]
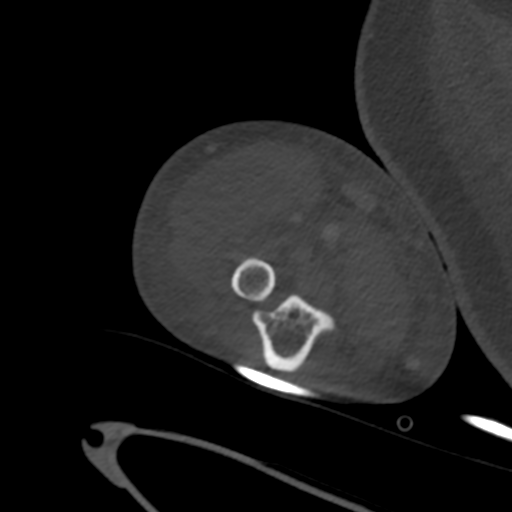

[Series 10: cor 1.5 mm st · coronal · 0.32mm/px · 1 of 117 slices shown]
[im 59/117  bone]
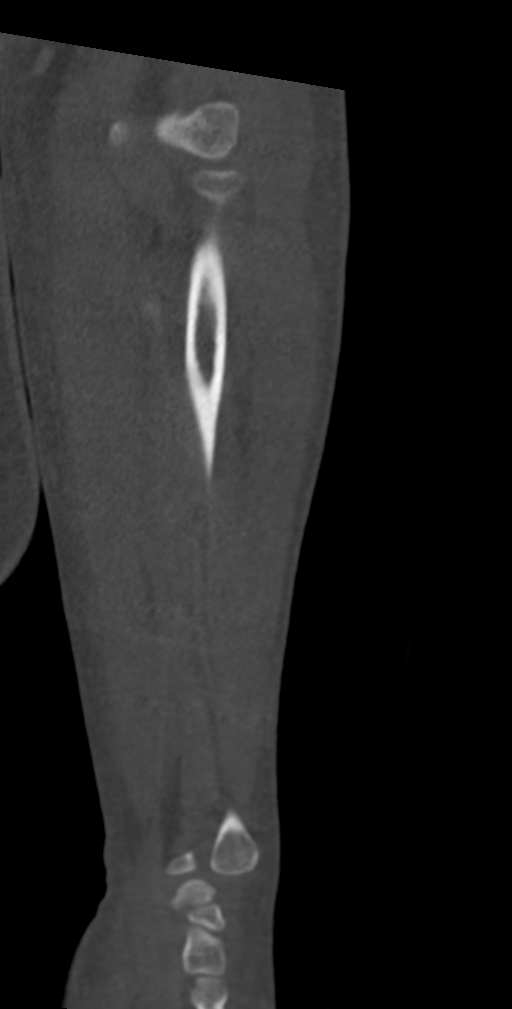

[Series 11: sag 1.5 mm bone · sagittal · 0.36mm/px · 5 of 134 slices shown, 6 images]
[im 45/134  bone]
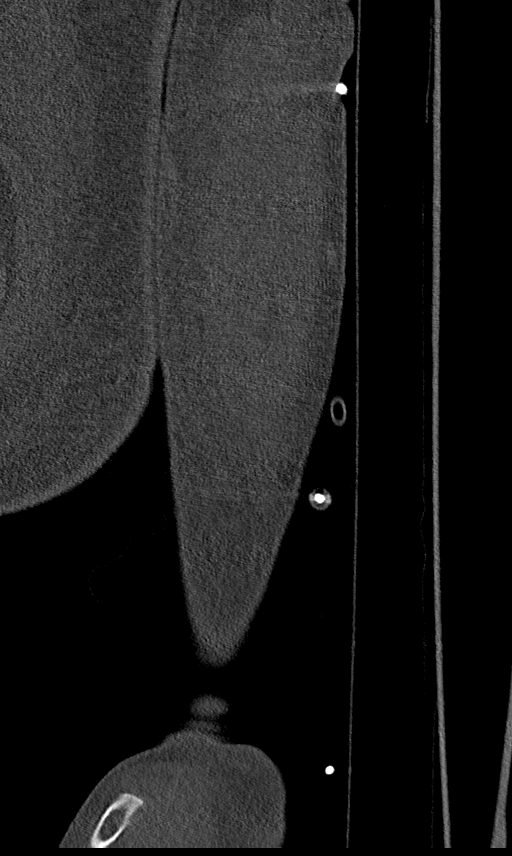
[im 56/134  bone]
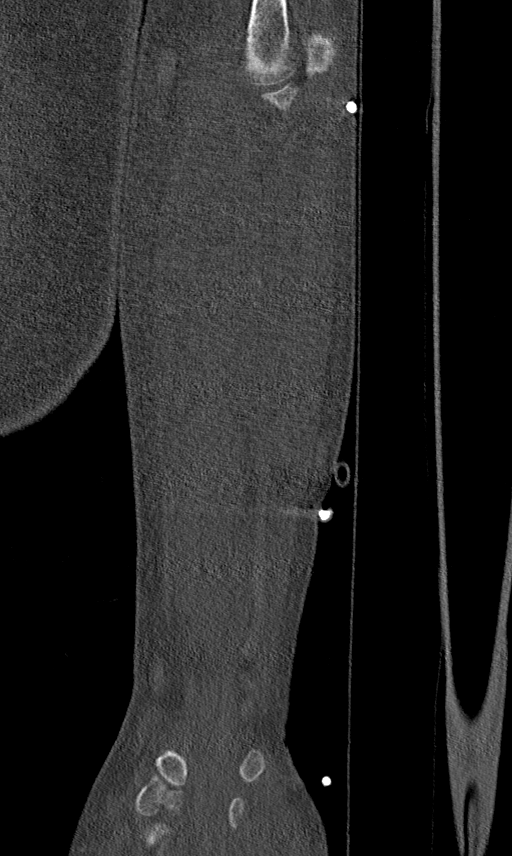
[im 67/134  soft-tissue]
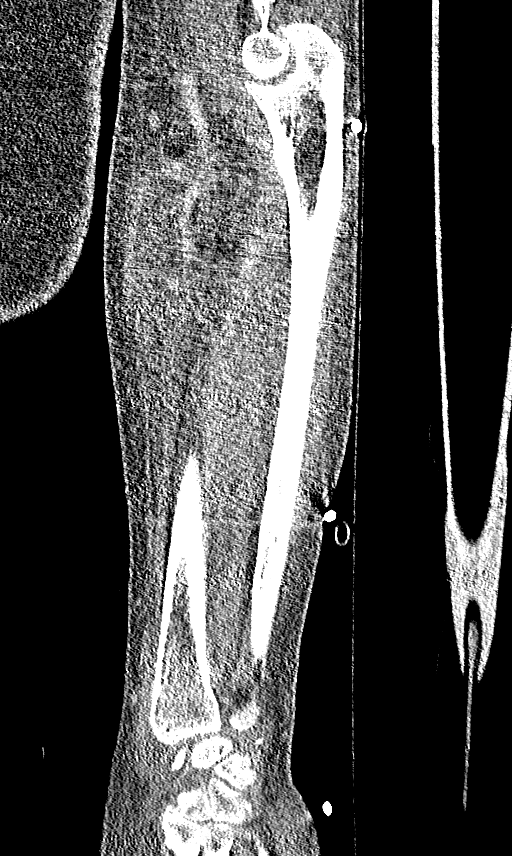
[im 67/134  bone]
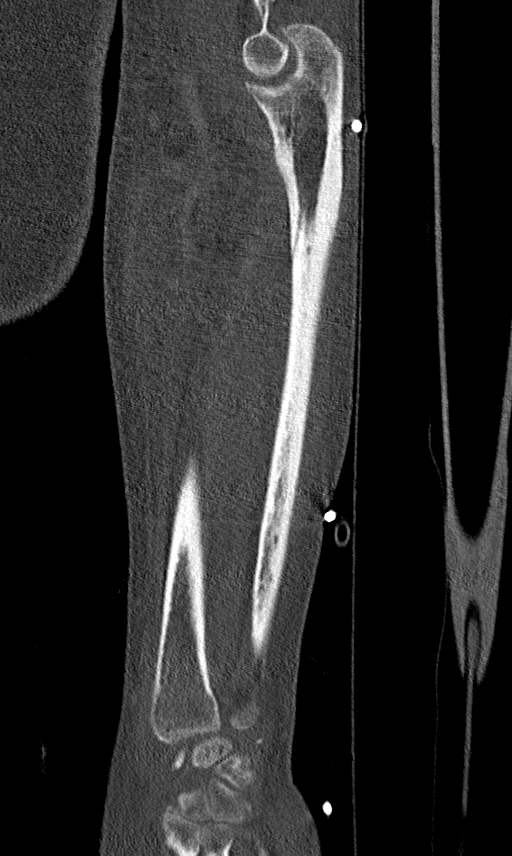
[im 78/134  bone]
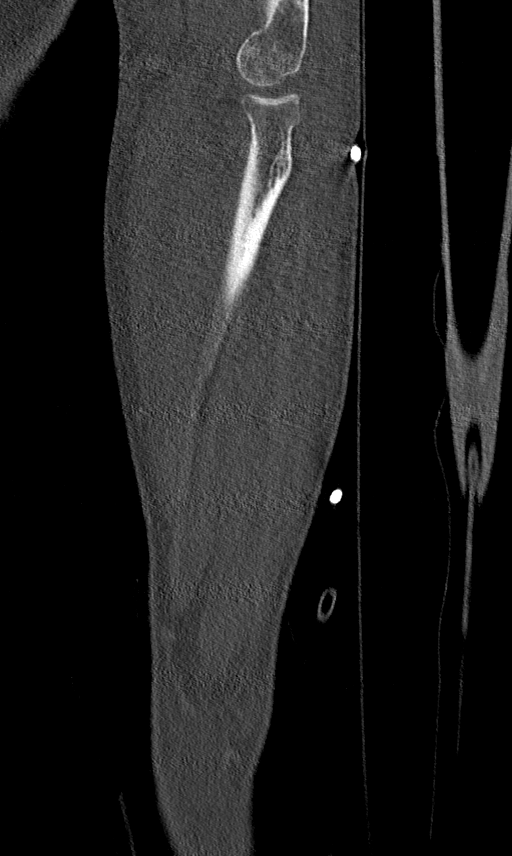
[im 89/134  bone]
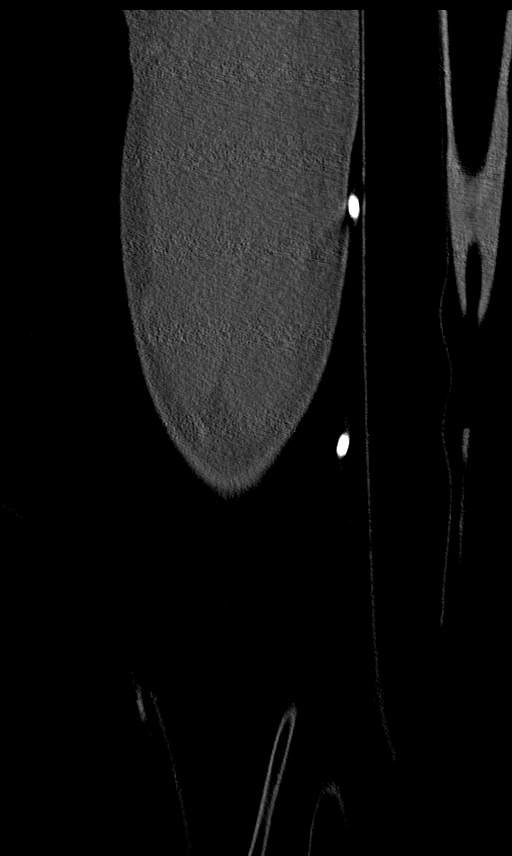

[11 of 35 positions shown; findings below may reference images not displayed]

FINDINGS: Bones/Joint/Cartilage

No acute fracture. No dislocation. No bony erosion or periosteal
elevation. Mild-to-moderate arthropathy of the right elbow with
subchondral cystic changes in the capitellum. No appreciable elbow
joint effusion. Well-circumscribed cystic structure within the
triquetrum, likely an intraosseous ganglion. No definite radiocarpal
joint effusion.

Ligaments

Suboptimally assessed by CT.

Muscles and Tendons

Elongated area of relative decreased attenuation within the flexor
compartment musculature of the proximal forearm, which appears
predominantly located within the brachioradialis muscle and possibly
a portion of the extensor carpi radialis longus muscle measuring
approximately 3.2 x 1.2 x 14.0 cm (see series 6, images 1-83). No
well-defined enhancing rim. Remaining musculotendinous structures
appear grossly intact. No appreciable tenosynovial fluid collections
at the level of the wrist.

Soft tissues

Circumferential subcutaneous edema. No fluid collections are evident
within the subcutaneous soft tissues. No soft tissue gas.
IMPRESSION: 1. Elongated area of relative decreased attenuation within the
flexor compartment musculature of the proximal to mid forearm, which
appears predominantly located within the brachioradialis muscle and
possibly a portion of the extensor carpi radialis longus muscle
measuring approximately 3.2 x 1.2 x 14.0 cm. No well-defined
enhancing rim. Findings are nonspecific and could reflect myositis
and possibly developing intramuscular phlegmon or abscess. Consider
further evaluation with ultrasound or MRI to assess for a fluid
component.
2. Circumferential subcutaneous edema, which may represent
cellulitis. No fluid collections within the subcutaneous soft
tissues. No soft tissue gas.
3. No acute osseous abnormality.

## 2020-11-20 IMAGING — DX DG CHEST 1V PORT
1 series · 1 of 1 positions shown · non-contrast
Comparison: [DATE]

CLINICAL DATA: Status post central line placement

EXAM:
PORTABLE CHEST 1 VIEW

[chest]
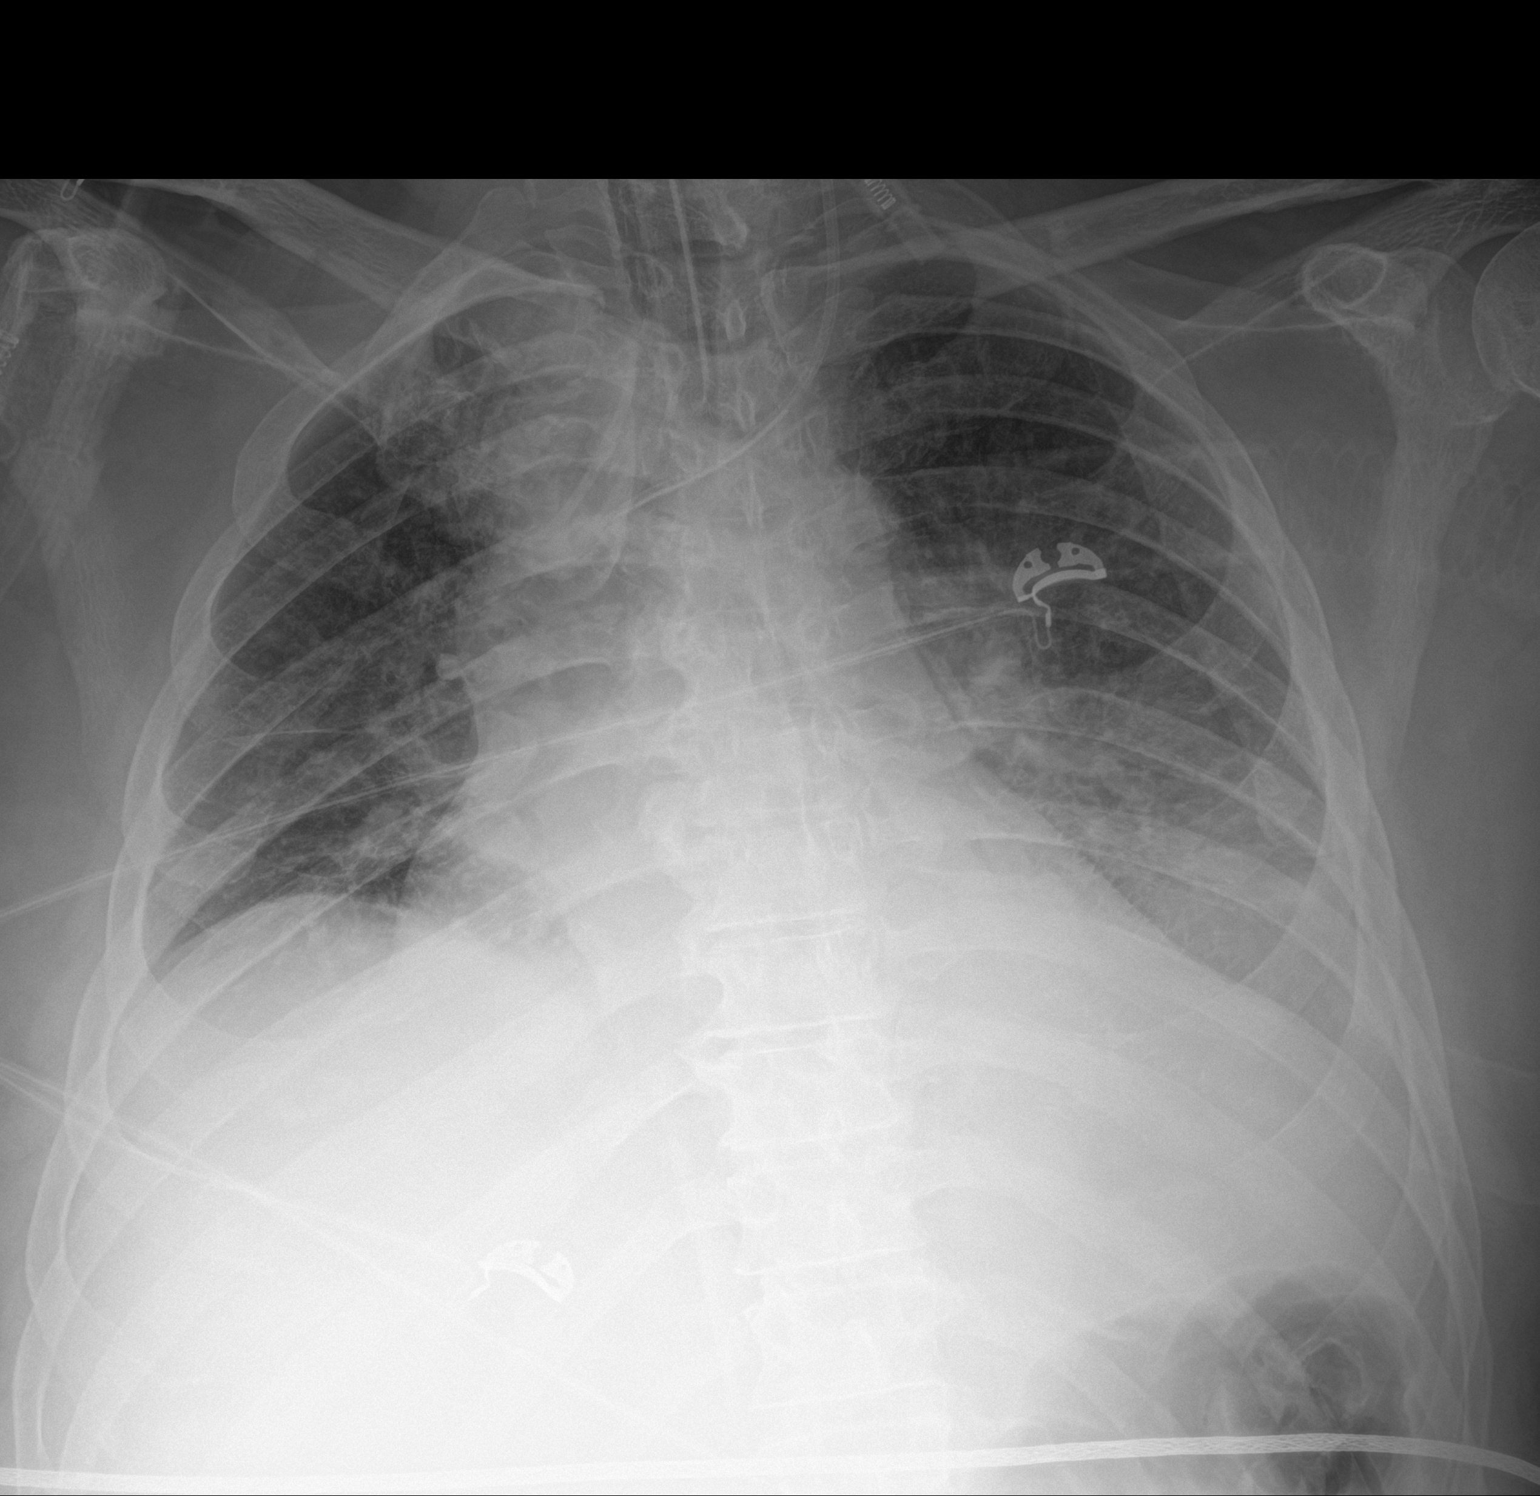

[1 of 1 positions shown; findings below may reference images not displayed]

FINDINGS: Left jugular central line is noted with catheter tip at the proximal
superior vena cava. No pneumothorax is noted. Endotracheal tube is
seen in satisfactory position. Cardiac shadow is prominent but
stable. Lungs are hypoinflated with increasing opacity in the left
base. No bony abnormality is seen.
IMPRESSION: Tubes and lines as described.

Hypoinflation with increasing left basilar opacity.

## 2020-11-20 SURGERY — IRRIGATION AND DEBRIDEMENT EXTREMITY
Anesthesia: General | Site: Arm Lower | Laterality: Right

## 2020-11-20 MED ORDER — PROPOFOL 10 MG/ML IV BOLUS
INTRAVENOUS | Status: AC
  Filled 2020-11-20: qty 20

## 2020-11-20 MED ORDER — LACTATED RINGERS IV SOLN: INTRAVENOUS | Status: DC | PRN

## 2020-11-20 MED ORDER — SODIUM CHLORIDE 0.9% FLUSH: 10.0000 mL | INTRAVENOUS | Status: DC | PRN

## 2020-11-20 MED ORDER — LIDOCAINE 2% (20 MG/ML) 5 ML SYRINGE
INTRAMUSCULAR | Status: AC
  Filled 2020-11-20: qty 5

## 2020-11-20 MED ORDER — CHLORHEXIDINE GLUCONATE 4 % EX LIQD
60.0000 mL | Freq: Once | CUTANEOUS | Status: DC
  Filled 2020-11-20: qty 60

## 2020-11-20 MED ORDER — SUGAMMADEX SODIUM 200 MG/2ML IV SOLN
INTRAVENOUS | Status: DC | PRN
  Administered 2020-11-20: 100 mg via INTRAVENOUS

## 2020-11-20 MED ORDER — AMLODIPINE BESYLATE 10 MG PO TABS
10.0000 mg | ORAL_TABLET | Freq: Every day | ORAL | Status: DC
  Administered 2020-11-20 – 2020-11-28 (×9): 10 mg via ORAL
  Filled 2020-11-20 (×9): qty 1

## 2020-11-20 MED ORDER — IOHEXOL 300 MG/ML  SOLN
100.0000 mL | Freq: Once | INTRAMUSCULAR | Status: AC | PRN
  Administered 2020-11-20: 100 mL via INTRAVENOUS

## 2020-11-20 MED ORDER — ONDANSETRON HCL 4 MG/2ML IJ SOLN
INTRAMUSCULAR | Status: DC | PRN
  Administered 2020-11-20: 4 mg via INTRAVENOUS

## 2020-11-20 MED ORDER — DOCUSATE SODIUM 50 MG/5ML PO LIQD
100.0000 mg | Freq: Two times a day (BID) | ORAL | Status: DC
  Administered 2020-11-21 (×2): 100 mg
  Filled 2020-11-20 (×3): qty 10

## 2020-11-20 MED ORDER — CHLORHEXIDINE GLUCONATE 0.12% ORAL RINSE (MEDLINE KIT)
15.0000 mL | Freq: Two times a day (BID) | OROMUCOSAL | Status: DC
  Administered 2020-11-20 – 2020-11-22 (×4): 15 mL via OROMUCOSAL

## 2020-11-20 MED ORDER — LIDOCAINE 2% (20 MG/ML) 5 ML SYRINGE
INTRAMUSCULAR | Status: DC | PRN
  Administered 2020-11-20: 40 mg via INTRAVENOUS

## 2020-11-20 MED ORDER — AMISULPRIDE (ANTIEMETIC) 5 MG/2ML IV SOLN: 10.0000 mg | Freq: Once | INTRAVENOUS | Status: DC | PRN

## 2020-11-20 MED ORDER — DEXMEDETOMIDINE HCL IN NACL 400 MCG/100ML IV SOLN
0.0000 ug/kg/h | INTRAVENOUS | Status: DC
  Administered 2020-11-20: 0.4 ug/kg/h via INTRAVENOUS
  Administered 2020-11-21 (×2): 0.6 ug/kg/h via INTRAVENOUS
  Administered 2020-11-21: 0.9 ug/kg/h via INTRAVENOUS
  Administered 2020-11-22: 0.8 ug/kg/h via INTRAVENOUS
  Administered 2020-11-22: 0.6 ug/kg/h via INTRAVENOUS
  Filled 2020-11-20 (×6): qty 100

## 2020-11-20 MED ORDER — PROPOFOL 1000 MG/100ML IV EMUL
INTRAVENOUS | Status: AC
  Filled 2020-11-20: qty 100

## 2020-11-20 MED ORDER — FENTANYL CITRATE (PF) 100 MCG/2ML IJ SOLN: 25.0000 ug | INTRAMUSCULAR | Status: DC | PRN

## 2020-11-20 MED ORDER — PROPOFOL 10 MG/ML IV BOLUS
INTRAVENOUS | Status: DC | PRN
  Administered 2020-11-20: 130 mg via INTRAVENOUS

## 2020-11-20 MED ORDER — ONDANSETRON HCL 4 MG/2ML IJ SOLN
INTRAMUSCULAR | Status: AC
  Filled 2020-11-20: qty 2

## 2020-11-20 MED ORDER — ALBUTEROL SULFATE HFA 108 (90 BASE) MCG/ACT IN AERS
INHALATION_SPRAY | RESPIRATORY_TRACT | Status: DC | PRN
  Administered 2020-11-20: 3 via RESPIRATORY_TRACT
  Administered 2020-11-20: 4 via RESPIRATORY_TRACT

## 2020-11-20 MED ORDER — FENTANYL CITRATE (PF) 250 MCG/5ML IJ SOLN
INTRAMUSCULAR | Status: AC
  Filled 2020-11-20: qty 5

## 2020-11-20 MED ORDER — INSULIN DETEMIR 100 UNIT/ML ~~LOC~~ SOLN
15.0000 [IU] | Freq: Two times a day (BID) | SUBCUTANEOUS | Status: DC
  Administered 2020-11-20: 15 [IU] via SUBCUTANEOUS
  Filled 2020-11-20 (×4): qty 0.15

## 2020-11-20 MED ORDER — SUCCINYLCHOLINE CHLORIDE 200 MG/10ML IV SOSY
PREFILLED_SYRINGE | INTRAVENOUS | Status: DC | PRN
  Administered 2020-11-20: 140 mg via INTRAVENOUS

## 2020-11-20 MED ORDER — CEFAZOLIN SODIUM-DEXTROSE 2-4 GM/100ML-% IV SOLN
2.0000 g | INTRAVENOUS | Status: AC
  Administered 2020-11-20: 2 g via INTRAVENOUS
  Filled 2020-11-20: qty 100

## 2020-11-20 MED ORDER — ORAL CARE MOUTH RINSE
15.0000 mL | OROMUCOSAL | Status: DC
  Administered 2020-11-20 – 2020-11-22 (×15): 15 mL via OROMUCOSAL

## 2020-11-20 MED ORDER — POVIDONE-IODINE 10 % EX SWAB: 2.0000 "application " | Freq: Once | CUTANEOUS | Status: DC

## 2020-11-20 MED ORDER — ROCURONIUM BROMIDE 10 MG/ML (PF) SYRINGE
PREFILLED_SYRINGE | INTRAVENOUS | Status: DC | PRN
  Administered 2020-11-20: 30 mg via INTRAVENOUS

## 2020-11-20 MED ORDER — SODIUM CHLORIDE 0.9 % IR SOLN
Status: DC | PRN
  Administered 2020-11-20 (×2): 1000 mL

## 2020-11-20 MED ORDER — FENTANYL CITRATE (PF) 100 MCG/2ML IJ SOLN
25.0000 ug | INTRAMUSCULAR | Status: DC | PRN
  Administered 2020-11-20 – 2020-11-21 (×2): 100 ug via INTRAVENOUS
  Administered 2020-11-21 – 2020-11-22 (×3): 50 ug via INTRAVENOUS
  Filled 2020-11-20 (×4): qty 2

## 2020-11-20 MED ORDER — POLYETHYLENE GLYCOL 3350 17 G PO PACK
17.0000 g | PACK | Freq: Every day | ORAL | Status: DC
  Administered 2020-11-21 – 2020-11-22 (×2): 17 g
  Filled 2020-11-20 (×2): qty 1

## 2020-11-20 MED ORDER — ROCURONIUM BROMIDE 10 MG/ML (PF) SYRINGE
PREFILLED_SYRINGE | INTRAVENOUS | Status: AC
  Filled 2020-11-20: qty 10

## 2020-11-20 MED ORDER — SODIUM CHLORIDE 0.9% FLUSH
10.0000 mL | Freq: Two times a day (BID) | INTRAVENOUS | Status: DC
  Administered 2020-11-20 – 2020-11-28 (×12): 10 mL

## 2020-11-20 MED ORDER — ALBUTEROL SULFATE HFA 108 (90 BASE) MCG/ACT IN AERS
INHALATION_SPRAY | RESPIRATORY_TRACT | Status: AC
  Filled 2020-11-20: qty 6.7

## 2020-11-20 SURGICAL SUPPLY — 43 items
ALCOHOL 70% 16 OZ (MISCELLANEOUS) ×2 IMPLANT
BLADE SURG 10 STRL SS (BLADE) ×2 IMPLANT
BNDG CONFORM 3 STRL LF (GAUZE/BANDAGES/DRESSINGS) IMPLANT
BNDG ELASTIC 4X5.8 VLCR STR LF (GAUZE/BANDAGES/DRESSINGS) ×1 IMPLANT
COVER SURGICAL LIGHT HANDLE (MISCELLANEOUS) ×2 IMPLANT
DRAPE IMP U-DRAPE 54X76 (DRAPES) IMPLANT
DRAPE INCISE IOBAN 66X45 STRL (DRAPES) ×8 IMPLANT
DRAPE SURG 17X23 STRL (DRAPES) ×2 IMPLANT
DRAPE U-SHAPE 47X51 STRL (DRAPES) ×2 IMPLANT
DRSG EMULSION OIL 3X3 NADH (GAUZE/BANDAGES/DRESSINGS) ×2 IMPLANT
DRSG PAD ABDOMINAL 8X10 ST (GAUZE/BANDAGES/DRESSINGS) ×2 IMPLANT
DURAPREP 26ML APPLICATOR (WOUND CARE) ×2 IMPLANT
ELECT CAUTERY BLADE 6.4 (BLADE) ×2 IMPLANT
ELECT REM PT RETURN 9FT ADLT (ELECTROSURGICAL) ×2
ELECTRODE REM PT RTRN 9FT ADLT (ELECTROSURGICAL) ×1 IMPLANT
GAUZE SPONGE 4X4 12PLY STRL (GAUZE/BANDAGES/DRESSINGS) ×3 IMPLANT
GLOVE BIO SURGEON STRL SZ7.5 (GLOVE) ×4 IMPLANT
GLOVE SRG 8 PF TXTR STRL LF DI (GLOVE) ×2 IMPLANT
GLOVE SURG UNDER POLY LF SZ8 (GLOVE) ×4
GOWN STRL REUS W/ TWL LRG LVL3 (GOWN DISPOSABLE) ×2 IMPLANT
GOWN STRL REUS W/ TWL XL LVL3 (GOWN DISPOSABLE) ×2 IMPLANT
GOWN STRL REUS W/TWL LRG LVL3 (GOWN DISPOSABLE) ×4
GOWN STRL REUS W/TWL XL LVL3 (GOWN DISPOSABLE) ×6
KIT BASIN OR (CUSTOM PROCEDURE TRAY) ×2 IMPLANT
KIT TURNOVER KIT B (KITS) ×2 IMPLANT
NS IRRIG 1000ML POUR BTL (IV SOLUTION) ×4 IMPLANT
PACK ORTHO EXTREMITY (CUSTOM PROCEDURE TRAY) ×2 IMPLANT
PAD ARMBOARD 7.5X6 YLW CONV (MISCELLANEOUS) ×4 IMPLANT
PAD CAST 4YDX4 CTTN HI CHSV (CAST SUPPLIES) IMPLANT
PADDING CAST ABS 4INX4YD NS (CAST SUPPLIES) ×1
PADDING CAST ABS COTTON 4X4 ST (CAST SUPPLIES) ×2 IMPLANT
PADDING CAST COTTON 4X4 STRL (CAST SUPPLIES) ×2
SPONGE LAP 18X18 RF (DISPOSABLE) ×3 IMPLANT
STOCKINETTE IMPERVIOUS 9X36 MD (GAUZE/BANDAGES/DRESSINGS) ×2 IMPLANT
SUT ETHILON 2 0 FS 18 (SUTURE) ×2 IMPLANT
SWAB COLLECTION DEVICE MRSA (MISCELLANEOUS) ×2 IMPLANT
SWAB CULTURE ESWAB REG 1ML (MISCELLANEOUS) ×1 IMPLANT
SYR BULB IRRIG 60ML STRL (SYRINGE) ×1 IMPLANT
TOWEL GREEN STERILE (TOWEL DISPOSABLE) ×2 IMPLANT
TOWEL GREEN STERILE FF (TOWEL DISPOSABLE) ×2 IMPLANT
TUBE CONNECTING 12X1/4 (SUCTIONS) ×2 IMPLANT
UNDERPAD 30X36 HEAVY ABSORB (UNDERPADS AND DIAPERS) ×3 IMPLANT
YANKAUER SUCT BULB TIP NO VENT (SUCTIONS) ×2 IMPLANT

## 2020-11-20 NOTE — Anesthesia Preprocedure Evaluation (Addendum)
Anesthesia Evaluation  Patient identified by MRN, date of birth, ID band Patient confused    Reviewed: Allergy & Precautions, NPO status , Patient's Chart, lab work & pertinent test results  Airway Mallampati: II  TM Distance: >3 FB Neck ROM: Limited    Dental  (+) Dental Advisory Given, Missing   Pulmonary neg pulmonary ROS,    breath sounds clear to auscultation       Cardiovascular hypertension, Pt. on medications +CHF  + Valvular Problems/Murmurs AI  Rhythm:Regular Rate:Normal  11/20/20: 1. Left ventricular ejection fraction, by estimation, is 35 to 40%. The left ventricle has moderately decreased function. The left ventricle demonstrates global hypokinesis. There is mild concentric left ventricular hypertrophy. Left ventricular diastolic parameters are consistent with Grade II diastolic dysfunction (pseudonormalization). Elevated left atrial pressure.  2. Right ventricular systolic function is normal. The right ventricular size is normal.  3. The mitral valve is normal in structure. Mild mitral valve  regurgitation. No evidence of mitral stenosis.  4. The aortic valve is normal in structure. Aortic valve regurgitation is present, unable to assess severity due to poor technical quality of the study. Suspect mild regurgitation. No aortic stenosis is present.  5. The inferior vena cava is normal in size with greater than 50% respiratory variability, suggesting right atrial pressure of 3 mmHg.    Neuro/Psych negative neurological ROS     GI/Hepatic negative GI ROS, Neg liver ROS,   Endo/Other  diabetes, Type 2  Renal/GU negative Renal ROS     Musculoskeletal   Abdominal   Peds  Hematology  (+) anemia ,   Anesthesia Other Findings   Reproductive/Obstetrics                            Anesthesia Physical Anesthesia Plan  ASA: III and emergent  Anesthesia Plan: General   Post-op Pain  Management:    Induction: Intravenous and Rapid sequence  PONV Risk Score and Plan: 2 and Ondansetron, Midazolam and Treatment may vary due to age or medical condition  Airway Management Planned: Oral ETT  Additional Equipment: None  Intra-op Plan:   Post-operative Plan: Extubation in OR and Possible Post-op intubation/ventilation  Informed Consent: I have reviewed the patients History and Physical, chart, labs and discussed the procedure including the risks, benefits and alternatives for the proposed anesthesia with the patient or authorized representative who has indicated his/her understanding and acceptance.     Dental advisory given  Plan Discussed with: CRNA  Anesthesia Plan Comments:        Anesthesia Quick Evaluation

## 2020-11-20 NOTE — Progress Notes (Signed)
OT Cancellation Note  Patient Details Name: Daniel Cline MRN: [id number] DOB: [birth year]   Cancelled Treatment:    Reason Eval/Treat Not Completed: Patient at procedure or test/ unavailable Going for I&D R UE. Will follow up next week.  Thornell Mule, OT/L   Acute OT Clinical Specialist Acute Rehabilitation Services Pager [phone] Office [phone]  11/20/2020, 5:26 PM

## 2020-11-20 NOTE — Anesthesia Postprocedure Evaluation (Signed)
Anesthesia Post Note  Patient: Daniel Cline  Procedure(s) Performed: IRRIGATION AND DEBRIDEMENT FOREARM (Right Arm Lower)     Patient location during evaluation: ICU Anesthesia Type: General Level of consciousness: sedated and patient remains intubated per anesthesia plan Pain management: pain level controlled Vital Signs Assessment: post-procedure vital signs reviewed and stable Respiratory status: patient remains intubated per anesthesia plan and patient on ventilator - see flowsheet for VS Cardiovascular status: stable Postop Assessment: no apparent nausea or vomiting Anesthetic complications: no   No complications documented.  Last Vitals:  Vitals:   11/20/20 2000 11/20/20 2005  BP: (!) 146/98 (!) 146/72  Pulse: 82 84  Resp: 14 (!) 21  Temp: 36.7 C   SpO2: (!) 88% 93%    Last Pain:  Vitals:   11/20/20 1600  TempSrc:   PainSc: 2                  Aniesa Boback COKER
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
## 2020-11-20 NOTE — Anesthesia Procedure Notes (Signed)
Procedure Name: Intubation Date/Time: 11/20/2020 6:51 PM Performed by: Modena Morrow, CRNA Pre-anesthesia Checklist: Patient identified, Emergency Drugs available, Suction available and Patient being monitored Patient Re-evaluated:Patient Re-evaluated prior to induction Oxygen Delivery Method: Circle system utilized Preoxygenation: Pre-oxygenation with 100% oxygen Induction Type: IV induction Ventilation: Mask ventilation without difficulty Laryngoscope Size: Glidescope and 4 Grade View: Grade I Tube type: Oral Tube size: 7.5 mm Number of attempts: 1 Airway Equipment and Method: Stylet and Oral airway Placement Confirmation: ETT inserted through vocal cords under direct vision,  positive ETCO2 and breath sounds checked- equal and bilateral Secured at: 23 cm Tube secured with: Tape Dental Injury: Teeth and Oropharynx as per pre-operative assessment

## 2020-11-20 NOTE — Progress Notes (Addendum)
Inpatient Diabetes Program Recommendations  AACE/ADA: New Consensus Statement on Inpatient Glycemic Control (2015)  Target Ranges:  Prepandial:   less than 140 mg/dL      Peak postprandial:   less than 180 mg/dL (1-2 hours)      Critically ill patients:  140 - 180 mg/dL   Lab Results  Component Value Date   GLUCAP 262 (H) 11/19/2020   HGBA1C 13.9 (H) 11/19/2020    Review of Glycemic Control Results for Daniel Cline, Daniel Cline (MRN [id number]) as of 11/20/2020 09:39  Ref. Range 11/19/2020 07:22 11/19/2020 10:58 11/19/2020 15:20 11/19/2020 21:54 11/20/2020 07:25  Glucose-Capillary Latest Ref Range: 70 - 99 mg/dL 171 (H) 261 (H) 262 (H) 262 (H) 224 (H)   Diabetes history: DM 2 Outpatient Diabetes medications:  Not taking meds since 04/2020 (metformin 1000 mg bid) Current orders for Inpatient glycemic control:  Levemir 10 units bid Novolog 0-15 units tid + hs  A1c 13.9%  Inpatient Diabetes Program Recommendations:    - increase Levemir to 14 units bid  Will require long acting insulin at time of d/c. Will need to run benefits check Lantus versus Levemir. If no insurance may need 70/30.  Glucose meter kit order # [id number] Will see on Monday to allow for recovery time and appropriateness for education.  Thanks,  Tama Headings RN, MSN, BC-ADM Inpatient Diabetes Coordinator Team Pager [phone] (8a-5p)

## 2020-11-20 NOTE — Brief Op Note (Signed)
11/20/2020  7:33 PM  PATIENT:  Gwenith Spitz III  65 y.o. male  PRE-OPERATIVE DIAGNOSIS:  Infection Right Forearm  POST-OPERATIVE DIAGNOSIS:  Infection Right Forearm  PROCEDURE:  Procedure(s): IRRIGATION AND DEBRIDEMENT FOREARM (Right)  SURGEON:  Surgeon(s) and Role:    * Yolonda Kida, MD - Primary  PHYSICIAN ASSISTANT:   ASSISTANTS: none   ANESTHESIA:   general  EBL:  10 mL   BLOOD ADMINISTERED:none  DRAINS: Penrose drain in the right forearm   LOCAL MEDICATIONS USED:  NONE  SPECIMEN:  Source of Specimen:  culture right forearm  DISPOSITION OF SPECIMEN:  micro  COUNTS:  YES  TOURNIQUET:  * Missing tourniquet times found for documented tourniquets in log: [id number] *  DICTATION: .Note written in EPIC  PLAN OF CARE: Admit to inpatient   PATIENT DISPOSITION:  ICU - extubated and stable.   Delay start of Pharmacological VTE agent (>24hrs) due to surgical blood loss or risk of bleeding: not applicable

## 2020-11-20 NOTE — Progress Notes (Signed)
eLink Physician-Brief Progress Note Patient Name: Daniel Cline DOB: [birth year] MRN: [id number]   Date of Service  11/20/2020  HPI/Events of Note  Patient is s/p right upper extremity abscess I & D, with post-operative respiratory failure.  eICU Interventions  New Patient Evaluation completed. Mechanical ventilation orders entered, PAD protocol orders entered,  CXR  Ordered for line placement, bilateral extremity soft restraints ordered.        Thomasene Lot Wenceslao Loper 11/20/2020, 8:25 PM

## 2020-11-20 NOTE — Progress Notes (Signed)
Anesthesiology Note:  Daniel Cline is a 65 year old male with poorly controlled diabetes who was admitted on 10/29/2021 with a spontaneous cervical epidural abscess.  He underwent emergent C3-7 cervical laminectomy and evacuation of the abscess on 3/23.He was subsequently found to have pain and erythema of the right forearm and underwent drainage of a R. Forearm abscess this evening by Dr. Marin Comment.  Prior to surgery the patient was lethargic and would give monosyllabic answers to repeated questions.  His oxygen saturation was 86% on room air.  He underwent I&D of the right forearm under general anesthesia and was intubated without difficulty.  Following surgery and reversal of his muscle relaxation, he was spontaneously breathing at a rate of 35-40 on CPAP with an oxygen saturation of 84%.  Due to his likely ongoing sepsis decreased mental status and respiratory insufficiency it was elected to keep him intubated.  He was brought to the 3 M and placed on the ventilator.  A left double-lumen central line was inserted in the operating room under sterile conditions prior to transport and he was placed on a propofol infusion for sedation.  VS on arrival to 75M: T- 36.7 BO 146/72 HR 84 O2 sat 93% on PRVC 510/RR 18 FiO2 1.0 PIP 26    Kipp Brood, MD

## 2020-11-20 NOTE — Progress Notes (Signed)
I called Daniel Cline son, Daniel Cline to update him from infection treatment. He will be going to OR for R arm I&D with Dr. Vertis Kelch. Explained benefit of having T spine MRI so we can establish a baseline for treatment monitoring.   Also asked him to help [place] keep an eye on his dad's left knee. Underwhelming on exam at this time but may evolve and declare itself.     All questions welcomed and answered.    [name], MSN, NP-C [facility]  [email or web address] Pager: [phone] Office: [phone] [facility] Main Line: [phone]
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
## 2020-11-20 NOTE — Op Note (Signed)
Date of Surgery: 11/20/2020  INDICATIONS: Daniel Cline is a 65 y.o.-year-old male with a right volar forearm abscess.  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  This was managed with a posterior laminectomy decompression.  He was noted while in the ICU to have increasing pain and swelling of the right forearm.  CT scan did show a intramuscular abscess.  He was indicated for open irrigation and debridement.;  The patient and His son did consent to the procedure after discussion of the risks and benefits.  PREOPERATIVE DIAGNOSIS:  1.  Right forearm intramuscular abscess  POSTOPERATIVE DIAGNOSIS: Same.  PROCEDURE:  Incision and drainage of intramuscular abscess right forearm  SURGEON: Maryan Rued, M.D.  ASSIST: None.  ANESTHESIA:  general  IV FLUIDS AND URINE: See anesthesia.  ESTIMATED BLOOD LOSS: 10 mL.  IMPLANTS: None  DRAINS: Penrose drain, not sewn in right forearm  COMPLICATIONS: None.  DESCRIPTION OF PROCEDURE: The patient was brought to the operating room and placed supine on the operating table.  The patient had been signed prior to the procedure and this was documented. The patient had the anesthesia placed by the anesthesiologist.  A time-out was performed to confirm that this was the correct patient, site, side and location. The patient did receive antibiotics prior to the incision and was re-dosed during the procedure as needed at indicated intervals.  A tourniquet was not placed.  The patient had the operative extremity prepped and draped in the standard surgical fashion.     We began the procedure by establishing a longitudinal incision on the ulnar border of the brachial radialis muscle in the proximal forearm.  We then sharply incised the muscle fascia.  We then bluntly entered the muscle itself.  At this time we encountered abundant purulence.  We expressed about 30 cc of purulent material.  We then bluntly opened up the intramuscular  space in this tract proximal to the distal forearm within the brachial radialis muscle belly.  We then noted that it tracked distal all the way down to the distal third of the forearm.  We used a hemostat and tonsil to bluntly open up the loculations of the abscess cavity.  We then created a counterincision on the volar forearm along the FCR sheath in the distal third of the forearm.  We then tunneled this into the muscle abscess.  We then used this to irrigate through the muscle itself.  We ran 2 L of normal saline through this.  Cultures were sent off the back table for microbiology analysis.  We then began closing the wounds.  The distal wound was closed with interrupted 2-0 nylon sutures.  We then placed a Penrose drain throughout the entire length of the abscess cavity.  We exited this through the proximal volar incision.  We then closed it as well with 2-0 nylon.  A standard sterile bulky dressing was applied.  There were no noted intraoperative complications.  The patient was awakened from general anesthetic in stable condition.  All counts were correct x2.  POSTOPERATIVE PLAN:  Patient will be returned back to the neurosurgical services.  Penrose drain will be pulled on Sunday or Monday.  He will continue antibiotics per the infectious disease recommendations.  He can be weightbearing as tolerated with no sling to the right arm.  They can maintain postoperative bandages until we remove the drain on postoperative day 2 or 3.  If these dressings become saturated may be changed with  dry dressings.

## 2020-11-20 NOTE — Consult Note (Signed)
ORTHOPAEDIC CONSULTATION  REQUESTING PHYSICIAN: Julio Sicks, MD  PCP:  Patient, No Pcp Per  Chief Complaint: Right forearm abscess  HPI: Daniel Cline is a 65 y.o. male who complains of increased pain and weakness in his right arm.  However, he recently did have a epidural cervical abscess that was treated with laminectomy posterior decompression.  He has had some residual pain and weakness in both arms as presenting with that.  However now has been noted on physical exam to have increased erythema and tenderness to palpation as well as some induration around right radial forearm.  CT scan showed abscess.  Orthopedic surgery was called for management.  Patient endorses that he lives alone, and is independent with activities of daily living.  He is right-hand dominant.  Denies tobacco use.  Past Medical History:  Diagnosis Date  . Diabetes mellitus without complication [facility])    Past Surgical History:  Procedure Laterality Date  . POSTERIOR CERVICAL LAMINECTOMY N/A 11/18/2020   Procedure: CERVICAL THREE-FOUR, CERVICAL FOUR-FIVE, CERVICAL FIVE-SIX, CERVICAL SIX-SEVEN CERVICAL LAMINECTOMY FOR EVACUATION OF ABSCESS;  Surgeon: Julio Sicks, MD;  Location: [facility] OR;  Service: Neurosurgery;  Laterality: N/A;   Social History   Socioeconomic History  . Marital status: Single    Spouse name: Not on file  . Number of children: Not on file  . Years of education: Not on file  . Highest education level: Not on file  Occupational History  . Not on file  Tobacco Use  . Smoking status: Never Smoker  . Smokeless tobacco: Never Used  Substance and Sexual Activity  . Alcohol use: Yes    Comment: occ  . Drug use: Never  . Sexual activity: Not on file  Other Topics Concern  . Not on file  Social History Narrative  . Not on file   Social Determinants of Health   Financial Resource Strain: Not on file  Food Insecurity: Not on file  Transportation Needs: Not on file  Physical Activity:  Not on file  Stress: Not on file  Social Connections: Not on file   History reviewed. No pertinent family history. No Known Allergies Prior to Admission medications   Medication Sig Start Date End Date Taking? Authorizing Provider  diphenhydrAMINE (BENADRYL) 25 MG tablet Take 25 mg by mouth every 6 (six) hours as needed for allergies.   Yes [provider]  HYDROcodone-acetaminophen (NORCO/VICODIN) 5-325 MG tablet Take 1 tablet by mouth every 6 (six) hours as needed. Patient taking differently: Take 1 tablet by mouth every 6 (six) hours as needed for moderate pain. 11/09/20  Yes Wynetta Fines, MD  metFORMIN (GLUCOPHAGE) 1000 MG tablet Take 1,000 mg by mouth 2 (two) times daily. 11/12/20  Yes [provider]  metoprolol tartrate (LOPRESSOR) 50 MG tablet Take 50 mg by mouth 2 (two) times daily. 11/12/20  Yes [provider]  oxyCODONE (OXY IR/ROXICODONE) 5 MG immediate release tablet Take 5 mg by mouth 3 (three) times daily as needed for moderate pain. 11/12/20  Yes [provider]   CT FOREARM RIGHT W CONTRAST  Result Date: 11/20/2020 CLINICAL DATA:  Right forearm pain.  Fever.  Infection suspected EXAM: CT OF THE UPPER RIGHT EXTREMITY WITH CONTRAST TECHNIQUE: Multidetector CT imaging of the upper right extremity was performed according to the standard protocol following intravenous contrast administration. CONTRAST:  OMNIPAQUE IOHEXOL 300 MG/ML  SOLN COMPARISON:  None. FINDINGS: Bones/Joint/Cartilage No acute fracture. No dislocation. No bony erosion or periosteal elevation. Mild-to-moderate  arthropathy of the right elbow with subchondral cystic changes in the capitellum. No appreciable elbow joint effusion. Well-circumscribed cystic structure within the triquetrum, likely an intraosseous ganglion. No definite radiocarpal joint effusion. Ligaments Suboptimally assessed by CT. Muscles and Tendons Elongated area of relative decreased attenuation within the  flexor compartment musculature of the proximal forearm, which appears predominantly located within the brachioradialis muscle and possibly a portion of the extensor carpi radialis longus muscle measuring approximately 3.2 x 1.2 x 14.0 cm (see series 6, images 1-83). No well-defined enhancing rim. Remaining musculotendinous structures appear grossly intact. No appreciable tenosynovial fluid collections at the level of the wrist. Soft tissues Circumferential subcutaneous edema. No fluid collections are evident within the subcutaneous soft tissues. No soft tissue gas. IMPRESSION: 1. Elongated area of relative decreased attenuation within the flexor compartment musculature of the proximal to mid forearm, which appears predominantly located within the brachioradialis muscle and possibly a portion of the extensor carpi radialis longus muscle measuring approximately 3.2 x 1.2 x 14.0 cm. No well-defined enhancing rim. Findings are nonspecific and could reflect myositis and possibly developing intramuscular phlegmon or abscess. Consider further evaluation with ultrasound or MRI to assess for a fluid component. 2. Circumferential subcutaneous edema, which may represent cellulitis. No fluid collections within the subcutaneous soft tissues. No soft tissue gas. 3. No acute osseous abnormality. Electronically Signed   By: Duanne Guess D.O.   On: 11/20/2020 11:49   DG CHEST PORT 1 VIEW  Result Date: 11/19/2020 CLINICAL DATA:  Hypoxia EXAM: PORTABLE CHEST 1 VIEW COMPARISON:  None. FINDINGS: Lung volumes are low. Some streaky opacities favor atelectasis. More dense airspace opacities noted in the retrocardiac space. No visible pneumothorax. Suspect a small right pleural effusion. No visible left effusion. Cardiac silhouette is enlarged though may be accentuated by portable technique. Telemetry leads overlie the chest. No acute osseous or soft tissue abnormality. Degenerative changes are present in the imaged spine and  shoulders. Dextrocurvature of the spine. IMPRESSION: 1. Low lung volumes with bibasilar atelectasis 2. More dense airspace opacity in the retrocardiac space, could reflect pneumonia or further volume loss. 3. Small right pleural effusion. 4. Suspect cardiomegaly though may be accentuated by portable technique. Electronically Signed   By: Kreg Shropshire M.D.   On: 11/19/2020 01:15   ECHOCARDIOGRAM COMPLETE  Result Date: 11/20/2020    ECHOCARDIOGRAM REPORT   Patient Name:   MANCIL PFENNING Cline Date of Exam: 11/20/2020 Medical Rec #:  [id number]          Height:       66.0 in Accession #:    [id number]         Weight:       185.0 lb Date of Birth:  03-21-1956         BSA:          1.935 m Patient Age:    64 years           BP:           167/60 mmHg Patient Gender: M                  HR:           93 bpm. Exam Location:  Inpatient Procedure: 2D Echo, Intracardiac Opacification Agent, Color Doppler and Cardiac            Doppler Indications:    AI  History:        Patient has no prior history of Echocardiogram examinations.  Risk Factors:Diabetes.  Sonographer:    Neomia Dear RDCS Referring Phys: 68 Deantre S BYRUM  Sonographer Comments: Suboptimal apical window. IMPRESSIONS  1. Left ventricular ejection fraction, by estimation, is 35 to 40%. The left ventricle has moderately decreased function. The left ventricle demonstrates global hypokinesis. There is mild concentric left ventricular hypertrophy. Left ventricular diastolic parameters are consistent with Grade II diastolic dysfunction (pseudonormalization). Elevated left atrial pressure.  2. Right ventricular systolic function is normal. The right ventricular size is normal.  3. The mitral valve is normal in structure. Mild mitral valve regurgitation. No evidence of mitral stenosis.  4. The aortic valve is normal in structure. Aortic valve regurgitation is present, unable to assess severity due to poor technical quality of the study. Suspect mild  regurgitation. No aortic stenosis is present.  5. The inferior vena cava is normal in size with greater than 50% respiratory variability, suggesting right atrial pressure of 3 mmHg. Conclusion(s)/Recommendation(s): No evidence of valvular vegetations on this transthoracic echocardiogram. Unable to reliably assess the degree of aortic insufficiency, but it does not appear to be severe. Would recommend a transesophageal echocardiogram  to exclude infective endocarditis if clinically indicated. However, this will need to be performed as a delayed study following recent cervical spine surgery. FINDINGS  Left Ventricle: Left ventricular ejection fraction, by estimation, is 35 to 40%. The left ventricle has moderately decreased function. The left ventricle demonstrates global hypokinesis. Definity contrast agent was given IV to delineate the left ventricular endocardial borders. The left ventricular internal cavity size was normal in size. There is mild concentric left ventricular hypertrophy. Left ventricular diastolic parameters are consistent with Grade II diastolic dysfunction (pseudonormalization). Elevated left atrial pressure. Right Ventricle: The right ventricular size is normal. No increase in right ventricular wall thickness. Right ventricular systolic function is normal. Left Atrium: Left atrial size was normal in size. Right Atrium: Right atrial size was normal in size. Pericardium: There is no evidence of pericardial effusion. Mitral Valve: The mitral valve is normal in structure. Mild mitral valve regurgitation. No evidence of mitral valve stenosis. MV peak gradient, 5.1 mmHg. The mean mitral valve gradient is 2.0 mmHg. Tricuspid Valve: The tricuspid valve is normal in structure. Tricuspid valve regurgitation is not demonstrated. No evidence of tricuspid stenosis. Aortic Valve: The aortic valve is normal in structure. Aortic valve regurgitation is present, unable to assess severity due to poor technical  quality of the study. Suspect mild regurgitation. No aortic stenosis is present. Aortic valve mean gradient measures 2.0 mmHg. Aortic valve peak gradient measures 3.5 mmHg. Aortic valve area, by VTI measures 2.50 cm. Pulmonic Valve: The pulmonic valve was normal in structure. Pulmonic valve regurgitation is not visualized. No evidence of pulmonic stenosis. Aorta: The aortic root is normal in size and structure. Venous: The inferior vena cava is normal in size with greater than 50% respiratory variability, suggesting right atrial pressure of 3 mmHg. IAS/Shunts: No atrial level shunt detected by color flow Doppler.  LEFT VENTRICLE PLAX 2D LVIDd:         4.80 cm  Diastology LVIDs:         4.10 cm  LV e' medial:    5.77 cm/s LV PW:         1.30 cm  LV E/e' medial:  18.0 LV IVS:        1.10 cm  LV e' lateral:   5.43 cm/s LVOT diam:     2.00 cm  LV E/e' lateral: 19.2 LV SV:  35 LV SV Index:   18 LVOT Area:     3.14 cm  RIGHT VENTRICLE RV S prime:     11.50 cm/s TAPSE (M-mode): 1.4 cm LEFT ATRIUM             Index       RIGHT ATRIUM           Index LA diam:        3.40 cm 1.76 cm/m  RA Area:     11.50 cm LA Vol (A2C):   34.1 ml 17.63 ml/m RA Volume:   23.20 ml  11.99 ml/m LA Vol (A4C):   45.5 ml 23.52 ml/m LA Biplane Vol: 39.5 ml 20.42 ml/m  AORTIC VALVE                   PULMONIC VALVE AV Area (Vmax):    2.54 cm    PV Vmax:       0.79 m/s AV Area (Vmean):   2.70 cm    PV Vmean:      68.600 cm/s AV Area (VTI):     2.50 cm    PV VTI:        0.171 m AV Vmax:           92.90 cm/s  PV Peak grad:  2.5 mmHg AV Vmean:          61.600 cm/s PV Mean grad:  2.0 mmHg AV VTI:            0.142 m AV Peak Grad:      3.5 mmHg AV Mean Grad:      2.0 mmHg LVOT Vmax:         75.10 cm/s LVOT Vmean:        53.000 cm/s LVOT VTI:          0.113 m LVOT/AV VTI ratio: 0.80  AORTA Ao Root diam: 3.40 cm Ao Asc diam:  3.30 cm MITRAL VALVE MV Area (PHT): 6.12 cm     SHUNTS MV Area VTI:   1.61 cm     Systemic VTI:  0.11 m MV Peak  grad:  5.1 mmHg     Systemic Diam: 2.00 cm MV Mean grad:  2.0 mmHg MV Vmax:       1.13 m/s MV Vmean:      62.5 cm/s MV Decel Time: 124 msec MR Peak grad: 89.5 mmHg MR Mean grad: 55.0 mmHg MR Vmax:      473.00 cm/s MR Vmean:     345.0 cm/s MV E velocity: 104.00 cm/s MV A velocity: 61.70 cm/s MV E/A ratio:  1.69 Mihai Croitoru MD Electronically signed by Thurmon FairMihai Croitoru MD Signature Date/Time: 11/20/2020/4:36:40 PM    Final    VAS US UPPER EXTREMITY VENOUS DUPLEX  Result Date: 11/19/2020 UPPER VENOUS STUDY  Indications: Pain, Swelling, and Erythema Risk Factors: None identified. Limitations: Poor ultrasound/tissue interface and patient positioning, patient pain tolerance. Comparison Study: No prior studies. Performing Technologist: Chanda BusingGregory Collins RVT  Examination Guidelines: A complete evaluation includes B-mode imaging, spectral Doppler, color Doppler, and power Doppler as needed of all accessible portions of each vessel. Bilateral testing is considered an integral part of a complete examination. Limited examinations for reoccurring indications may be performed as noted.  Right Findings: +----------+------------+---------+-----------+----------+-------+ RIGHT     CompressiblePhasicitySpontaneousPropertiesSummary +----------+------------+---------+-----------+----------+-------+ IJV           Full       Yes       Yes                      +----------+------------+---------+-----------+----------+-------+  Subclavian    Full       Yes       Yes                      +----------+------------+---------+-----------+----------+-------+ Axillary      Full       Yes       Yes                      +----------+------------+---------+-----------+----------+-------+ Brachial      Full       Yes       Yes                      +----------+------------+---------+-----------+----------+-------+ Radial        Full                                           +----------+------------+---------+-----------+----------+-------+ Ulnar         Full                                          +----------+------------+---------+-----------+----------+-------+ Cephalic      Full                                          +----------+------------+---------+-----------+----------+-------+ Basilic       Full                                          +----------+------------+---------+-----------+----------+-------+  Left Findings: +----------+------------+---------+-----------+----------+-------+ LEFT      CompressiblePhasicitySpontaneousPropertiesSummary +----------+------------+---------+-----------+----------+-------+ Subclavian    Full       Yes       Yes                      +----------+------------+---------+-----------+----------+-------+  Summary:  Right: No evidence of deep vein thrombosis in the upper extremity. No evidence of superficial vein thrombosis in the upper extremity.  Left: No evidence of thrombosis in the subclavian.  *See table(s) above for measurements and observations.  Diagnosing physician: Sherald Hess MD Electronically signed by Sherald Hess MD on 11/19/2020 at 1:05:37 PM.    Final     Positive ROS: All other systems have been reviewed and were otherwise negative with the exception of those mentioned in the HPI and as above.  Physical Exam: General: Alert, no acute distress Cardiovascular: No pedal edema Respiratory: No cyanosis, no use of accessory musculature GI: No organomegaly, abdomen is soft and non-tender Skin: No lesions in the area of chief complaint Neurologic: Sensation intact distally Psychiatric: Patient is not  competent for consent with normal mood and affect Lymphatic: No axillary or cervical lymphadenopathy  MUSCULOSKELETAL:  Right upper extremity has moderate pitting edema down to the dorsal aspect of the hand.  He has some erythema and tenderness along the radial aspect of the  forearm at the mobile wad and then down to the mid forearm.  Tender to palpation there.  But no obvious fluctuance but mild induration noted.  Distally he is globally weak at 4-5 strength  with grip strength extension and flexion of the wrist.  Otherwise motor is intact grossly.  Endorses sensation intact light touch.  Capillary refill less than 2 seconds.  Assessment: Right forearm intramuscular abscess.  Plan: -At this time my recommendation would be to go ahead with an open incision and debridement with drainage of this intramuscular abscess.  Appears to be contained within the brachioradialis and extensor carpi radialis longus on CT scan.  We will plan for return to the medicine floor postoperatively.  He has been on preoperative antibiotics and will likely not obtain any additional cultures due to the likelihood of this being the same bug.  -Due to the patient's confused state and critically ill condition we have obtained consent from his son, Godwin Tedesco, [phone].    Yolonda Kida, MD Cell [phone]    11/20/2020 6:13 PM

## 2020-11-20 NOTE — OR Nursing (Signed)
Gave bedside report to [facility] ICU nurse in charge of pt's care, Shannon/RN. Advised her pt will need a chest xray to confirm central line placement (placed by anesthesia at end of case using ultrasound guidance).

## 2020-11-20 NOTE — Transfer of Care (Signed)
Immediate Anesthesia Transfer of Care Note  Patient: Daniel Cline  Procedure(s) Performed: IRRIGATION AND DEBRIDEMENT FOREARM (Right Arm Lower)  Patient Location: ICU  Anesthesia Type:General  Level of Consciousness: Patient remains intubated per anesthesia plan  Airway & Oxygen Therapy: Patient remains intubated per anesthesia plan and Patient placed on Ventilator (see vital sign flow sheet for setting)  Post-op Assessment: Report given to RN and Post -op Vital signs reviewed and stable  Post vital signs: Reviewed and stable  Last Vitals:  Vitals Value Taken Time  BP 146/72 11/20/20 2000  Temp    Pulse 83 11/20/20 2000  Resp 21 11/20/20 2000  SpO2 90 % 11/20/20 2000  Vitals shown include unvalidated device data.  Last Pain:  Vitals:   11/20/20 1600  TempSrc:   PainSc: 2       Patients Stated Pain Goal: 2 (11/20/20 1600)  Complications: No complications documented.

## 2020-11-20 NOTE — Progress Notes (Signed)
Postop day 2.  Patient remains encephalopathic and somewhat lethargic.  He will awaken to voice and he will answer some simple questions.  He will follow commands bilaterally.  He has reasonably good grip strength and biceps and triceps function bilaterally.  He continues to have some deltoid weakness on the left more so than the right.  He has at least 4+/5 strength in both lower extremities.  He is afebrile.  He remains hypertensive requiring Cardene.  He is oxygenating well.  No new lab studies today.  Cultures consistent with staph aureus.  Susceptibilities pending.  Patient with MRI scan of his thoracic spine ordered for evaluation of extension of his abscess into his thoracic spinal region.  I do not anticipate needing any type of further surgery but this may be beneficial with regard to maintenance of his condition should he progress.  Patient status post C3-C7 decompressive laminectomy and evacuation of epidural abscess.  Patient with extensive paraspinal abscess status post I&D.  Continue antibiotics and continue drain for now.

## 2020-11-20 NOTE — Progress Notes (Signed)
[facility]  Date of Admission:  11/18/2020      Total days of antibiotics 2  Vancomycin   Cefepime 3/23 >> 3/24    ASSESSMENT: Daniel Cline is a 65 y.o. male with C3-C7 epidural abscess now POD2 evacuation with Dr. Jordan Likes. MRI of C-spine reveal epidural abscess that extended beyond the visualized fields into the upper thoracic spine - we would like to do a T spine MRI to evaluate for abscess extension; while he may have no surgical indications at this time it would be very helpful to have a baseline to compare with in 8 weeks as we consider nearing the end of antibiotic treatment.  Continue vancomycin for now - staph aureus identified on cultures with susceptibilities pending.   Left knee pain with mild medial effusion - will follow clincally for now but very low threshold to have ortho see for arthrocentesis vs imaging to ensure he does not have a second site infected.   R FA erythema - CT scan pending but my review of imaging does not see anything drainable.   Dr. Drue Second will check in with him on Sunday to re-evaluate his knee and follow intraoperative cultures and pending studies.    PLAN: 1. MR T spine  2. Follow CT results of R FA but looks OK and no deep involvement  3. Continue Vancomycin IV  4. Follow micro from blood (negative, prelim) and back  5. Follow L knee clinically with low threshold to image for metastatic infection site     Active Problems:   Abscess   Epidural intraspinal abscess   Pressure injury of skin   Hypertension associated with diabetes (HCC)   . Chlorhexidine Gluconate Cloth  6 each Topical Daily  . insulin aspart  0-15 Units Subcutaneous TID WC  . insulin aspart  0-5 Units Subcutaneous QHS  . insulin detemir  10 Units Subcutaneous Q12H  . liver oil-zinc oxide   Topical BID  . metoprolol tartrate  50 mg Oral BID  . pantoprazole  40 mg Oral QHS  . sodium chloride flush  3 mL Intravenous Q12H     SUBJECTIVE: Feels OK. Still very weak in upper arms R>L. Complaining of some left knee pain and stiffness. Never has happened like this in the past. No fevers but sweating a lot and chills.     Review of Systems: Review of Systems  Constitutional: Positive for chills and diaphoresis. Negative for fever.  Respiratory: Negative for cough and shortness of breath.   Cardiovascular: Negative for chest pain and claudication.  Gastrointestinal: Negative for abdominal pain, nausea and vomiting.  Genitourinary: Negative for dysuria.  Musculoskeletal: Positive for back pain, joint pain and neck pain.  Skin: Negative for rash.  Neurological: Positive for weakness. Negative for dizziness and headaches.    No Known Allergies  OBJECTIVE: Vitals:   11/20/20 0945 11/20/20 0952 11/20/20 1000 11/20/20 1100  BP:  (!) 171/66 (!) 159/66   Pulse: (!) 115 (!) 117 88   Resp: (!) 25  (!) 23   Temp:    98.3 F (36.8 C)  TempSrc:    Oral  SpO2: 93%  91%   Weight:      Height:       Body mass index is 29.86 kg/m.  Physical Exam Constitutional:      Appearance: He is ill-appearing.     Comments: Having chills in bed.   Eyes:  General: No scleral icterus.    Conjunctiva/sclera: Conjunctivae normal.  Cardiovascular:     Rate and Rhythm: Normal rate and regular rhythm.     Heart sounds: No murmur heard.   Abdominal:     General: Bowel sounds are normal.     Palpations: Abdomen is soft.  Musculoskeletal:     Comments: R knee with mild effusion noted to medial aspect of knee. Some mild tenderness. Can bring leg up and flex actively but not to full 90 degrees. No erythema or warmth.   Neurological:     Mental Status: He is oriented to person, place, and time.     Motor: Weakness: weakness RUE > LUE      Lab Results Lab Results  Component Value Date   WBC 32.3 (H) 11/20/2020   HGB 12.4 (L) 11/20/2020   HCT 36.9 (L) 11/20/2020   MCV 92.9 11/20/2020   PLT 407 (H) 11/20/2020     Lab Results  Component Value Date   CREATININE 0.71 11/20/2020   BUN 18 11/20/2020   NA 132 (L) 11/20/2020   K 3.2 (L) 11/20/2020   CL 101 11/20/2020   CO2 20 (L) 11/20/2020    Lab Results  Component Value Date   ALT 32 08/20/2018   AST 38 08/20/2018   ALKPHOS 91 08/20/2018   BILITOT 0.9 08/20/2018     Microbiology: Recent Results (from the past 240 hour(s))  SARS Coronavirus 2 by RT PCR (hospital order, performed in [facility] hospital lab) Nasopharyngeal Nasopharyngeal Swab     Status: None   Collection Time: 11/18/20  4:53 PM   Specimen: Nasopharyngeal Swab  Result Value Ref Range Status   SARS Coronavirus 2 NEGATIVE NEGATIVE Final    Comment: (NOTE) SARS-CoV-2 target nucleic acids are NOT DETECTED.  The SARS-CoV-2 RNA is generally detectable in upper and lower respiratory specimens during the acute phase of infection. The lowest concentration of SARS-CoV-2 viral copies this assay can detect is 250 copies / mL. A negative result does not preclude SARS-CoV-2 infection and should not be used as the sole basis for treatment or other patient management decisions.  A negative result may occur with improper specimen collection / handling, submission of specimen other than nasopharyngeal swab, presence of viral mutation(s) within the areas targeted by this assay, and inadequate number of viral copies (<250 copies / mL). A negative result must be combined with clinical observations, patient history, and epidemiological information.  Fact Sheet for Patients:   BoilerBrush.com.cy  Fact Sheet for Healthcare Providers: https://pope.com/  This test is not yet approved or  cleared by the [place] FDA and has been authorized for detection and/or diagnosis of SARS-CoV-2 by FDA under an Emergency Use Authorization (EUA).  This EUA will remain in effect (meaning this test can be used) for the duration of the COVID-19 declaration  under Section 564(b)(1) of the Act, 21 U.S.C. section 360bbb-3(b)(1), unless the authorization is terminated or revoked sooner.  Performed at [facility], [address], [place], [place] [address]   Aerobic/Anaerobic Culture w Gram Stain (surgical/deep wound)     Status: None (Preliminary result)   Collection Time: 11/18/20  7:06 PM   Specimen: Abscess  Result Value Ref Range Status   Specimen Description ABSCESS  Final   Special Requests CERVICAL  Final   Gram Stain   Final    FEW WBC PRESENT, PREDOMINANTLY PMN ABUNDANT GRAM POSITIVE COCCI    Culture   Final    FEW  STAPHYLOCOCCUS AUREUS SUSCEPTIBILITIES TO FOLLOW Performed at [facility], [address] [place], [place] [address]    Report Status PENDING  Incomplete  Culture, blood (routine x 2)     Status: None (Preliminary result)   Collection Time: 11/18/20  9:56 PM   Specimen: BLOOD  Result Value Ref Range Status   Specimen Description BLOOD SITE NOT SPECIFIED  Final   Special Requests   Final    BOTTLES DRAWN AEROBIC AND ANAEROBIC Blood Culture adequate volume   Culture   Final    NO GROWTH 2 DAYS Performed at [facility], [address], [place], [place] [address]    Report Status PENDING  Incomplete  Culture, blood (routine x 2)     Status: None (Preliminary result)   Collection Time: 11/18/20 10:03 PM   Specimen: BLOOD  Result Value Ref Range Status   Specimen Description BLOOD SITE NOT SPECIFIED  Final   Special Requests   Final    BOTTLES DRAWN AEROBIC AND ANAEROBIC Blood Culture adequate volume   Culture   Final    NO GROWTH 2 DAYS Performed at [facility], [address] [place], [place] [address]    Report Status PENDING  Incomplete  MRSA PCR Screening     Status: None   Collection Time: 11/19/20 12:46 AM   Specimen: Nasopharyngeal  Result Value Ref Range Status   MRSA by PCR NEGATIVE NEGATIVE Final    Comment:        The GeneXpert MRSA Assay (FDA approved for NASAL  specimens only), is one component of a comprehensive MRSA colonization surveillance program. It is not intended to diagnose MRSA infection nor to guide or monitor treatment for MRSA infections. Performed at [facility], [address], [place], [place] [address]      [name], MSN, NP-C [facility]  [email or web address] Pager: [phone] Office: [phone] [facility] Main Line: [phone]

## 2020-11-21 ENCOUNTER — Inpatient Hospital Stay (HOSPITAL_COMMUNITY): Payer: Medicare Other

## 2020-11-21 DIAGNOSIS — J9601 Acute respiratory failure with hypoxia: Secondary | ICD-10-CM

## 2020-11-21 LAB — GLUCOSE, CAPILLARY
Glucose-Capillary: 123 mg/dL — ABNORMAL HIGH (ref 70–99)
Glucose-Capillary: 131 mg/dL — ABNORMAL HIGH (ref 70–99)
Glucose-Capillary: 173 mg/dL — ABNORMAL HIGH (ref 70–99)
Glucose-Capillary: 183 mg/dL — ABNORMAL HIGH (ref 70–99)
Glucose-Capillary: 185 mg/dL — ABNORMAL HIGH (ref 70–99)
Glucose-Capillary: 218 mg/dL — ABNORMAL HIGH (ref 70–99)

## 2020-11-21 LAB — CBC
HCT: 34.8 % — ABNORMAL LOW (ref 39.0–52.0)
Hemoglobin: 11.4 g/dL — ABNORMAL LOW (ref 13.0–17.0)
MCH: 30.9 pg (ref 26.0–34.0)
MCHC: 32.8 g/dL (ref 30.0–36.0)
MCV: 94.3 fL (ref 80.0–100.0)
Platelets: 385 10*3/uL (ref 150–400)
RBC: 3.69 MIL/uL — ABNORMAL LOW (ref 4.22–5.81)
RDW: 12.6 % (ref 11.5–15.5)
WBC: 31.8 10*3/uL — ABNORMAL HIGH (ref 4.0–10.5)
nRBC: 0 % (ref 0.0–0.2)

## 2020-11-21 LAB — COMPREHENSIVE METABOLIC PANEL
ALT: 22 U/L (ref 0–44)
AST: 26 U/L (ref 15–41)
Albumin: 1.2 g/dL — ABNORMAL LOW (ref 3.5–5.0)
Alkaline Phosphatase: 191 U/L — ABNORMAL HIGH (ref 38–126)
Anion gap: 8 (ref 5–15)
BUN: 21 mg/dL (ref 8–23)
CO2: 21 mmol/L — ABNORMAL LOW (ref 22–32)
Calcium: 7.9 mg/dL — ABNORMAL LOW (ref 8.9–10.3)
Chloride: 102 mmol/L (ref 98–111)
Creatinine, Ser: 0.9 mg/dL (ref 0.61–1.24)
GFR, Estimated: >60 mL/min (ref >60)
Glucose, Bld: 198 mg/dL — ABNORMAL HIGH (ref 70–99)
Potassium: 3.8 mmol/L (ref 3.5–5.1)
Sodium: 131 mmol/L — ABNORMAL LOW (ref 135–145)
Total Bilirubin: 0.7 mg/dL (ref 0.3–1.2)
Total Protein: 5.4 g/dL — ABNORMAL LOW (ref 6.5–8.1)

## 2020-11-21 IMAGING — DX DG ABDOMEN 1V
1 series · 1 of 1 positions shown · non-contrast
Comparison: None.

CLINICAL DATA: OG tube placement

EXAM:
ABDOMEN - 1 VIEW

[abdomen supine]
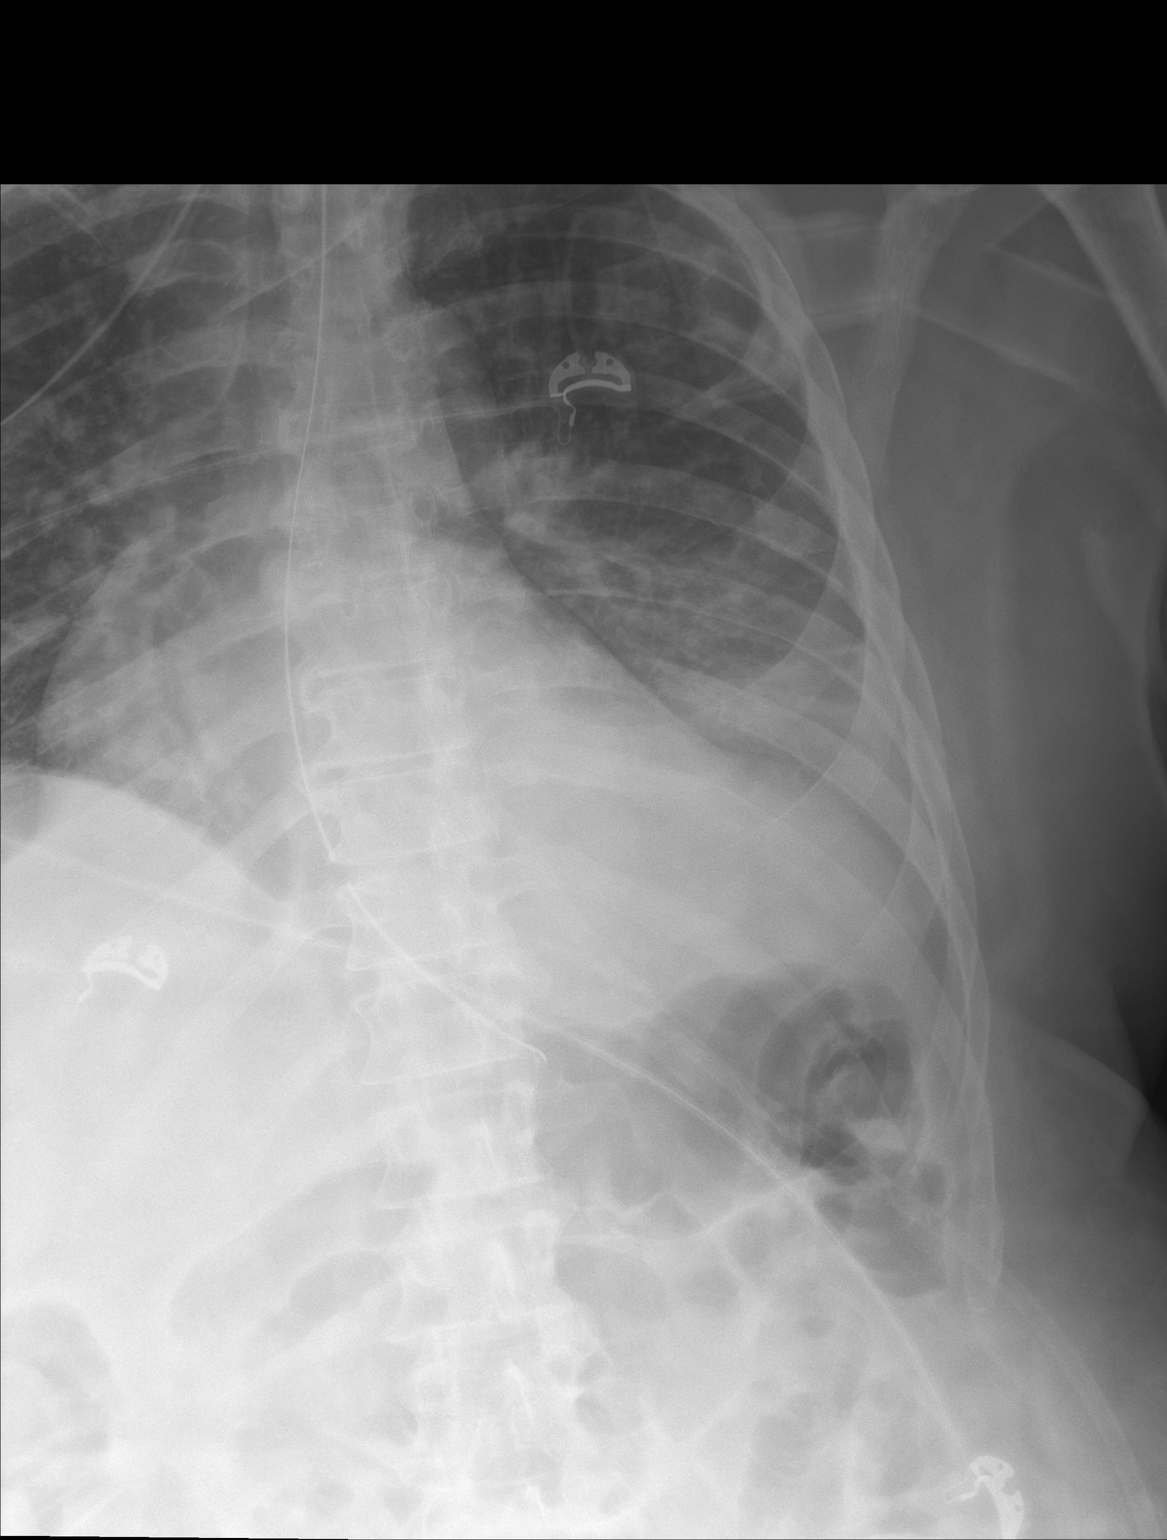

[1 of 1 positions shown; findings below may reference images not displayed]

FINDINGS: Esophagogastric tube is positioned with tip below the diaphragm,
side port above the gastroesophageal junction. Left pleural
effusion. Gas-filled bowel in the included upper abdomen.
IMPRESSION: 1. Esophagogastric tube is positioned with tip below the diaphragm,
side port above the gastroesophageal junction. Recommend advancement
to ensure subdiaphragmatic position.
2. Left pleural effusion.

## 2020-11-21 IMAGING — MR MR THORACIC SPINE WO/W CM
4 of 9 series · 18 of 48 positions shown · IV contrast (Yes   MULTIHANCE)
Comparison: Cervical spine MRI [DATE]

CLINICAL DATA: Cervical epidural abscess. Patient is status post
surgical evacuation and laminectomy

EXAM:
MRI THORACIC WITHOUT AND WITH CONTRAST
TECHNIQUE: Multiplanar and multiecho pulse sequences of the thoracic spine were
obtained without and with intravenous contrast.
CONTRAST:  9mL GADAVIST GADOBUTROL 1 MMOL/ML IV SOLN

[Series 5: T2 · sagittal · 3.0mm · 0.62mm/px · 2 of 12 slices shown (1 of 2)]
[im 1/12]
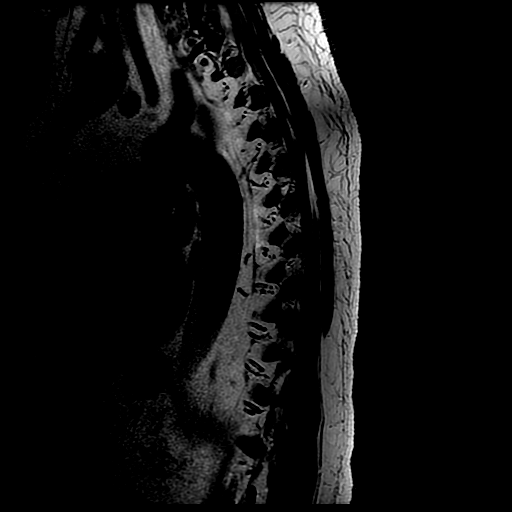
[im 12/12]
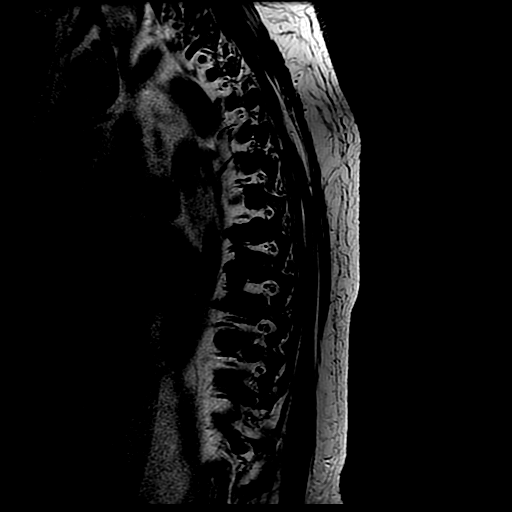

[Series 6: T1 · sagittal · 3.0mm · 0.62mm/px · 3 of 12 slices shown (1 of 2)]
[im 1/12]
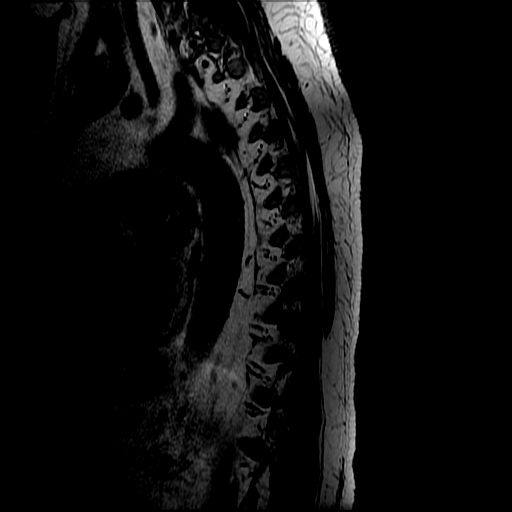
[im 6/12]
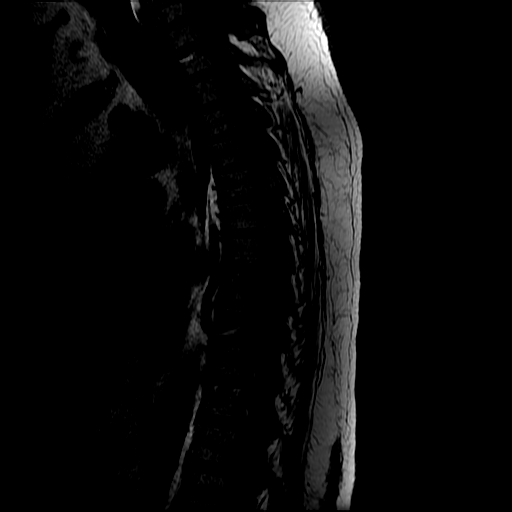
[im 12/12]
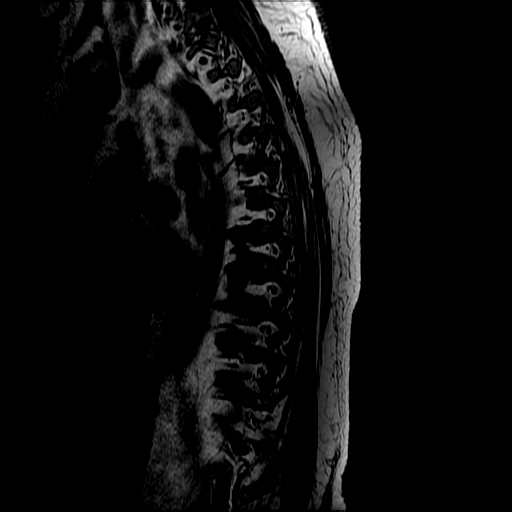

[Series 8: T2 · axial · 4.0mm · 0.43mm/px · z∈[-216,-3]mm · 9 of 36 slices shown (2 of 2)]
[im 1/36]
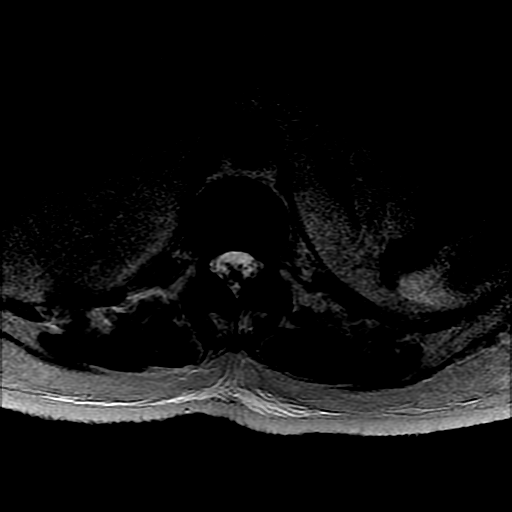
[im 5/36]
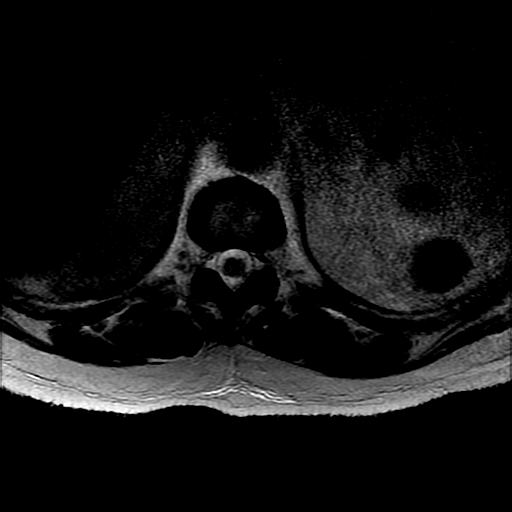
[im 9/36]
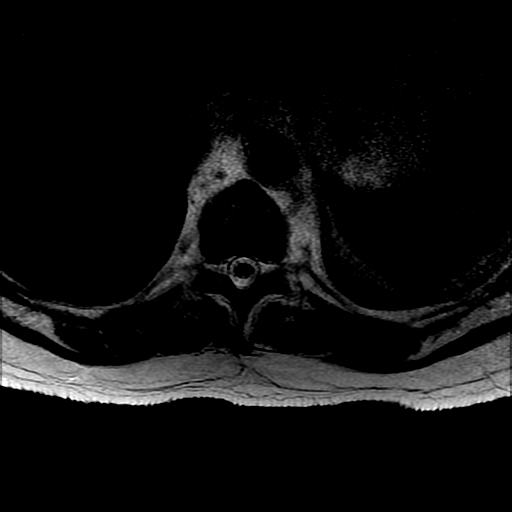
[im 14/36]
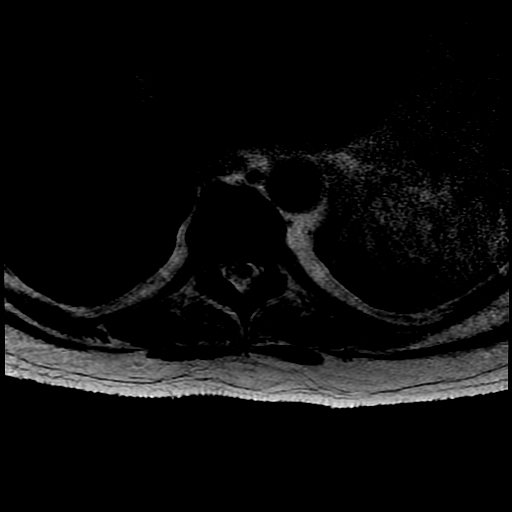
[im 18/36]
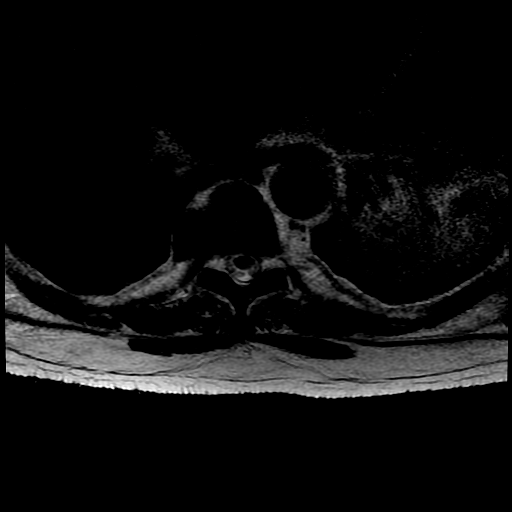
[im 22/36]
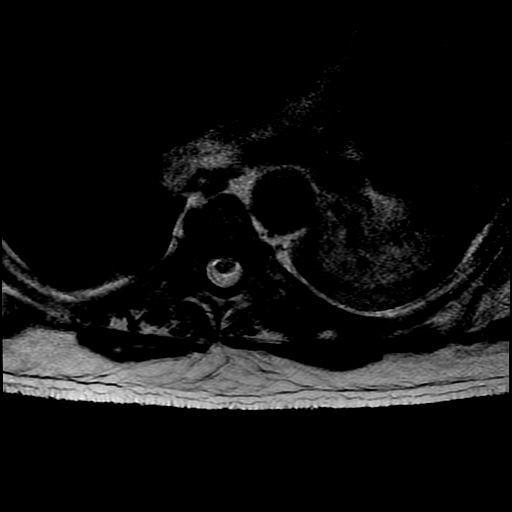
[im 27/36]
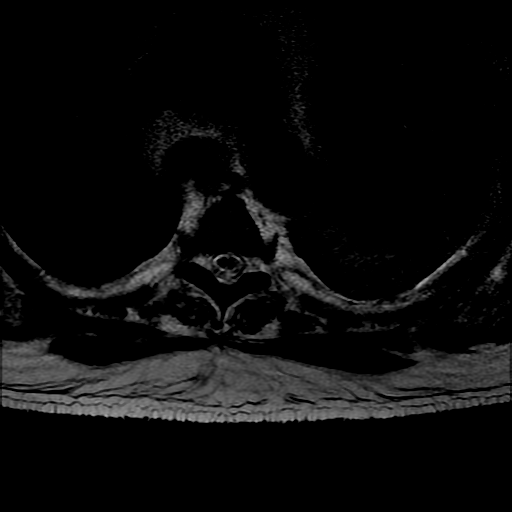
[im 31/36]
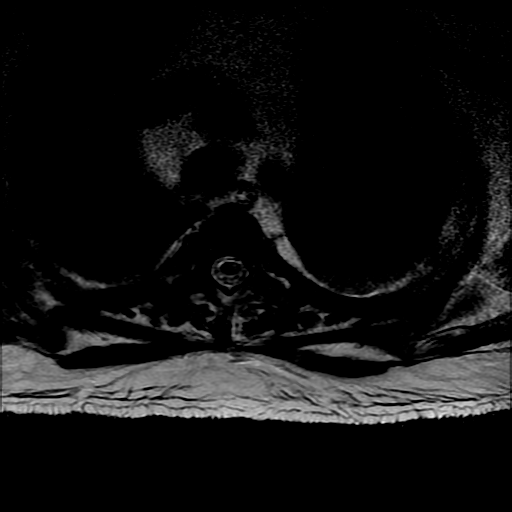
[im 36/36]
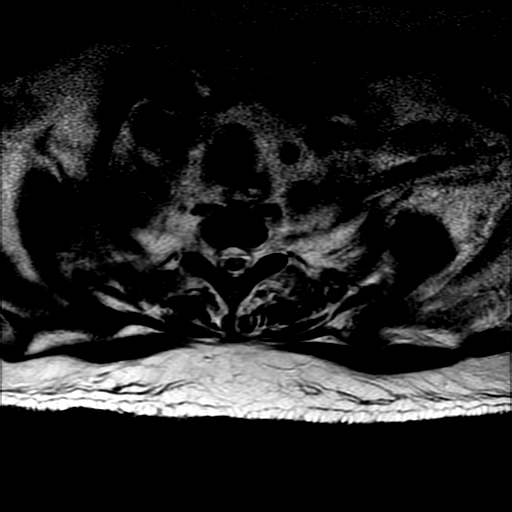

[Series 9: T1 · axial · non-contrast · 4.0mm · 0.39mm/px · z∈[-217,-26]mm · 4 of 36 slices shown (2 of 2)]
[im 1/36]
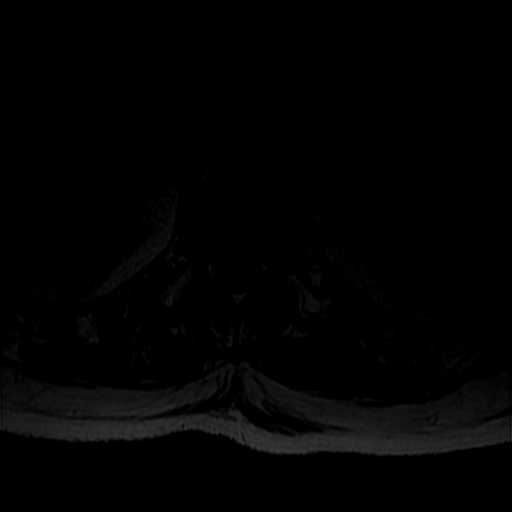
[im 5/36]
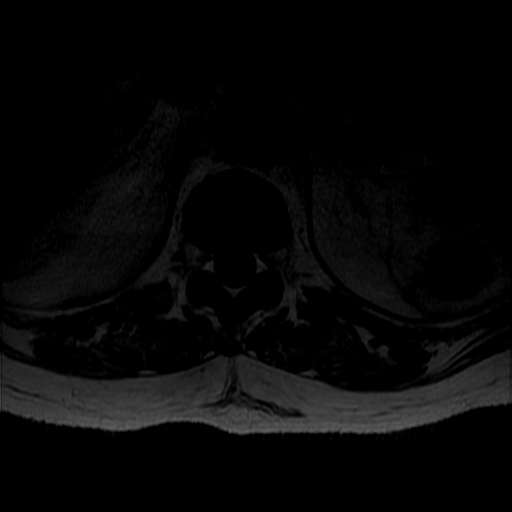
[im 18/36]
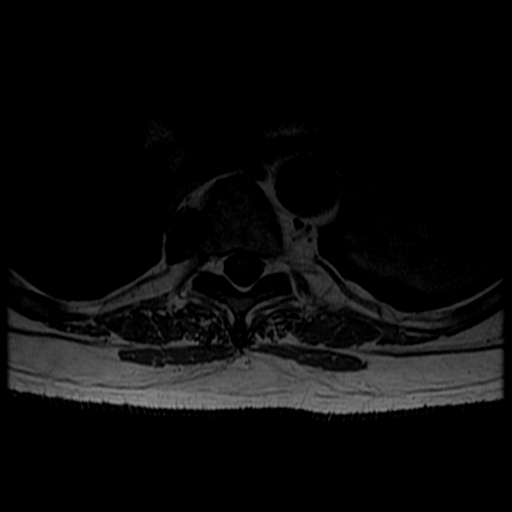
[im 31/36]
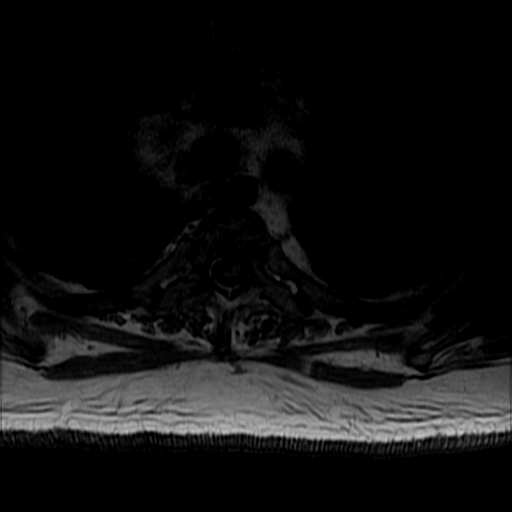

[18 of 48 positions shown; findings below may reference images not displayed]

FINDINGS: Alignment:  Physiologic.

Vertebrae: No fracture, evidence of discitis, or bone lesion.

Cord: Normal signal and morphology. No thoracic epidural fluid
collection. Thin epidural enhancement of the visualized lower
cervical spine extending inferiorly to the T1-T2 level (series 11,
image 7) without evidence of residual epidural fluid collection.

Paraspinal and other soft tissues: Postsurgical changes within the
posterior paraspinal soft tissues of the lower cervical spine are
partially imaged at the edge of the field of view. Lower cervical
posterior decompression. Simple appearing fluid collection at the
laminectomy bed within the lower cervical spine seen only on
sagittal sequences at the edge of the field of view measuring
approximately 2.8 x 2.1 cm (series 7, image 5). There is
intramuscular edema within the paraspinal musculature of the lower
cervical spine.

Disc levels:

Shallow disc protrusion at the T9-10 level without foraminal or
canal stenosis. Remaining thoracic intervertebral discs are within
normal limits. There is no foraminal or canal stenosis at any level
within the thoracic spine.
IMPRESSION: 1. No thoracic epidural fluid collection.
2. Thin epidural enhancement of the visualized lower cervical spine
extending inferiorly to the T1-T2 level without evidence of residual
epidural fluid collection. Findings are likely secondary to known
infection and recent surgery.
3. Postsurgical changes within the posterior paraspinal soft tissues
of the lower cervical spine are partially imaged at the edge of the
field of view. Simple appearing fluid collection at the laminectomy
bed within the lower cervical spine seen only on sagittal sequences
at the edge of the field of view measuring approximately 2.8 x
cm.
4. Intramuscular edema within the paraspinal musculature of the
lower cervical spine, which may reflect postsurgical change and/or
myositis.

## 2020-11-21 IMAGING — DX DG ABDOMEN 1V
1 series · 1 of 1 positions shown · non-contrast
Comparison: Film earlier this day

CLINICAL DATA: OG tube placement.

EXAM:
ABDOMEN - 1 VIEW

[abdomen supine]
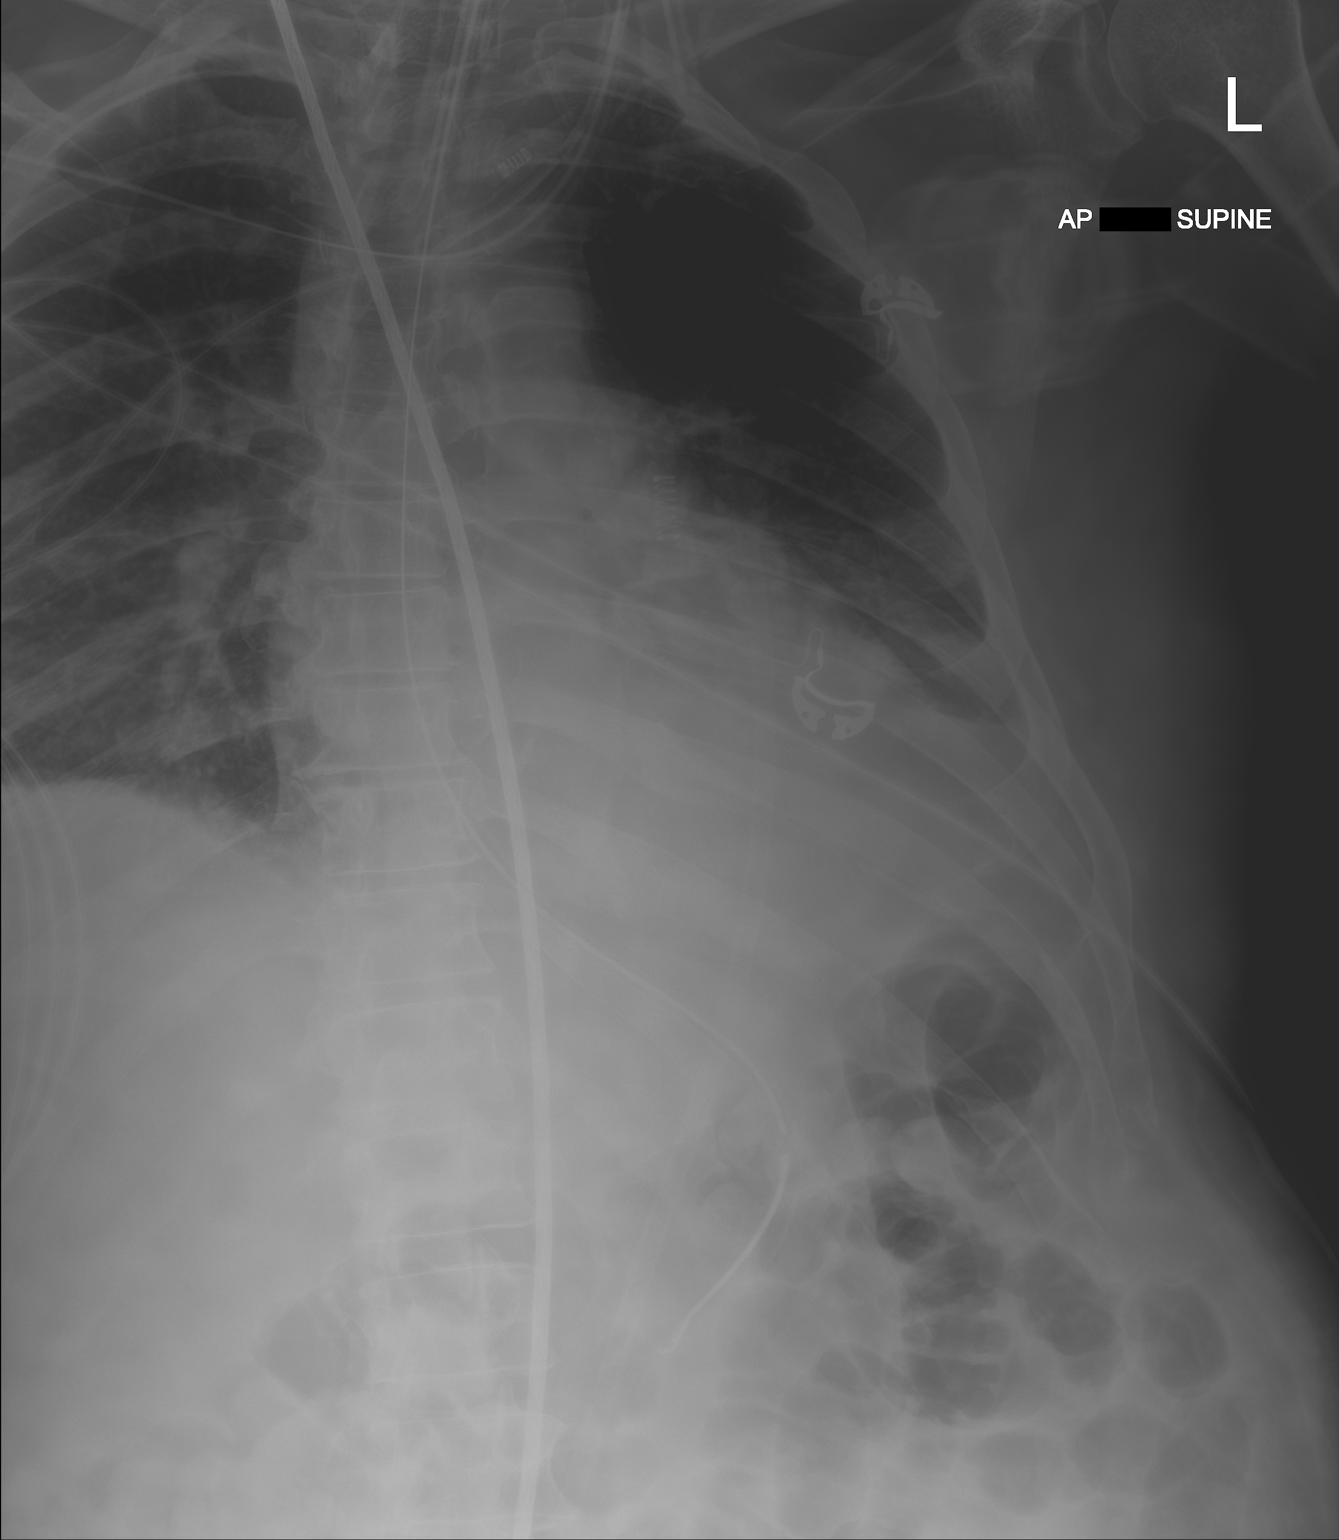

[1 of 1 positions shown; findings below may reference images not displayed]

FINDINGS: OG tube tip is identified overlying the mid stomach.

No other significant changes noted.
IMPRESSION: OG tube with tip overlying the mid stomach.

## 2020-11-21 MED ORDER — ONDANSETRON HCL 4 MG/2ML IJ SOLN: 4.0000 mg | Freq: Four times a day (QID) | INTRAMUSCULAR | Status: DC | PRN

## 2020-11-21 MED ORDER — INSULIN DETEMIR 100 UNIT/ML ~~LOC~~ SOLN
18.0000 [IU] | Freq: Two times a day (BID) | SUBCUTANEOUS | Status: DC
  Administered 2020-11-21 – 2020-11-22 (×4): 18 [IU] via SUBCUTANEOUS
  Filled 2020-11-21 (×6): qty 0.18

## 2020-11-21 MED ORDER — ONDANSETRON HCL 4 MG PO TABS: 4.0000 mg | ORAL_TABLET | Freq: Four times a day (QID) | ORAL | Status: DC | PRN

## 2020-11-21 MED ORDER — CHLORHEXIDINE GLUCONATE CLOTH 2 % EX PADS: 6.0000 | MEDICATED_PAD | Freq: Once | CUTANEOUS | Status: DC

## 2020-11-21 MED ORDER — DOCUSATE SODIUM 100 MG PO CAPS: 100.0000 mg | ORAL_CAPSULE | Freq: Two times a day (BID) | ORAL | Status: DC

## 2020-11-21 MED ORDER — CEFAZOLIN SODIUM-DEXTROSE 2-4 GM/100ML-% IV SOLN
2.0000 g | Freq: Three times a day (TID) | INTRAVENOUS | Status: DC
  Administered 2020-11-21 – 2020-11-22 (×3): 2 g via INTRAVENOUS
  Filled 2020-11-21 (×4): qty 100

## 2020-11-21 MED ORDER — CEFAZOLIN SODIUM-DEXTROSE 2-4 GM/100ML-% IV SOLN: 2.0000 g | INTRAVENOUS | Status: DC

## 2020-11-21 MED ORDER — LABETALOL HCL 5 MG/ML IV SOLN
10.0000 mg | Freq: Four times a day (QID) | INTRAVENOUS | Status: DC
  Administered 2020-11-21 – 2020-11-23 (×6): 10 mg via INTRAVENOUS
  Filled 2020-11-21 (×7): qty 4

## 2020-11-21 MED ORDER — GADOBUTROL 1 MMOL/ML IV SOLN
9.0000 mL | Freq: Once | INTRAVENOUS | Status: AC | PRN
  Administered 2020-11-21: 9 mL via INTRAVENOUS

## 2020-11-21 MED ORDER — DEXAMETHASONE SODIUM PHOSPHATE 10 MG/ML IJ SOLN: 10.0000 mg | Freq: Once | INTRAMUSCULAR | Status: DC

## 2020-11-21 NOTE — Progress Notes (Signed)
NAME:  Daniel Cline, MRN:  [id number], DOB:  1956/03/06, LOS: 3 ADMISSION DATE:  11/18/2020, CONSULTATION DATE:  11/18/2020 REFERRING MD:  Julio Sicks, CHIEF COMPLAINT:  Neck Pain   History of Present Illness:  64yM with DM who presented today to Neurosurgery clinic with severe neck pain with weakness in LUE. Much of history is obtained through chart review as he is drowsy at atime of my evaluation in PACU. He had a fall a week PTA and since then ahd increasing neck pain and weakness in LUE which has gradually worsened.   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 He underwent laminectomy/abscess evacuation today  Pertinent  Medical History  DM HTN  Significant Hospital Events: Including procedures, antibiotic start and stop dates in addition to other pertinent events   . 11/18/20 Laminectomy/abscess evacuation . 11/20/2020 I& D of right shoulder by orthopedics . ETT 3/25 >>  Interim History / Subjective:   Patient underwent I&D of right shoulder by orthopedics last evening, he tolerated procedure well, continue to remain hypertensive on Cardene infusion. Remain on mechanical ventilator  Objective   Blood pressure (!) 151/65, pulse 93, temperature 99.3 F (37.4 C), temperature source Oral, resp. rate (!) 22, height 5\' 6"  (1.676 m), weight 83.9 kg, SpO2 91 %.    Vent Mode: PRVC FiO2 (%):  [60 %-100 %] 60 % Set Rate:  [18 bmp] 18 bmp Vt Set:  [510 mL] 510 mL PEEP:  [5 cmH20] 5 cmH20 Plateau Pressure:  [13 cmH20-17 cmH20] 17 cmH20   Intake/Output Summary (Last 24 hours) at 11/21/2020 0751 Last data filed at 11/21/2020 0700 Gross per 24 hour  Intake 2705.24 ml  Output 1050 ml  Net 1655.24 ml   Filed Weights   11/18/20 1728  Weight: 83.9 kg    Examination: General: Acutely ill appearing, overweight male, hard of hearing, orally intubated HENT: Atraumatic, normocephalic, moist  mucous membranes, anicteric sclera.  ETT/OGT in place Lungs: Reduced air entry at the bases, no wheezes or rhonchi Cardiovascular: tachycardic, normal S1S2, possible early diastolic murmur, no MRG Abdomen: soft, round, non-tender, NABS Extremities: Right arm is wrapped in dressing Neuro: Opens eyes with vocal stimuli, following simple commands, currently on restraints, moving all 4 extremities  Skin: No rash  Labs/imaging that I have personally reviewed   MRI 11/18/20: paraspinal loculated fluid collections c1-c7 communicating with extensive epidural collection from c2 to upper t-spine, mass effect on cord c5-c7  Wound Gram Stain 11/19/20: Abundant GPCs  R Forearm CT 11/19/20: circumferential subcutaneous edema, decreased attenuation within flexor compartment musculature proximal to the mid forearm  Resolved Hospital Problem list     Assessment & Plan:  Acute hypoxic respiratory failure s/p right arm I&D Patient remained on mechanical ventilator X-ray chest suggestive of bilateral atelectasis Increase PEEP to 10, continue to trend down FiO2 SBT tomorrow and possible extubation  Sepsis due to epidural abcess s/p laminectomy/abscess evacuation Right upper arm abscess status post I&D ID follow-up is appreciated MRI thoracic spine is pending Continue IV vancomycin Follow-up cultures and sensitivities Continue neurochecks  Poorly controlled Diabetes Mellitus with hyperglycemia A1c 13.9 Fingersticks are not well controlled Increase Levemir to 18 units twice daily Continue sliding scale Monitor fingerstick with goal 140-180  HTN, uncontrolled Patient blood pressure still remain uncontrolled, requiring Cardene infusion Continue amlodipine 10 mg once daily Started on labetalol 10 mg every 6 hours, will restart more oral meds once patient gets extubated  Monitor blood pressure, try to taper off Cardene  Hyponatremia/hypokalemia Supplement electrolytes and closely monitor  Best  practice (evaluated daily)  Diet: Tube feeds Pain/Anxiety/Delirium protocol (if indicated): Norco, Dilaudid, morphine PRN VAP protocol (if indicated): Yes DVT prophylaxis: SCD GI prophylaxis: N/A Glucose control:  SSI Yes Central venous access: Yes 3/25 Arterial line:  Yes, and it is still needed Foley:  N/A Mobility:  bed rest  PT consulted: Yes Last date of multidisciplinary goals of care discussion: Per primary team Code Status:  full code Disposition: ICU  Labs   CBC: Recent Labs  Lab 11/18/20 1728 11/18/20 1902 11/18/20 2209 11/19/20 0613 11/20/20 0610 11/20/20 2046 11/21/20 0512  WBC 36.6*  --  39.4* 39.6* 32.3*  --  31.8*  NEUTROABS  --   --  34.6*  --   --   --   --   HGB 15.3   < > 14.8 12.9* 12.4* 11.2* 11.4*  HCT 44.6   < > 45.2 38.0* 36.9* 33.0* 34.8*  MCV 91.2  --  93.4 92.5 92.9  --  94.3  PLT 427*  --  414* 377 407*  --  385   < > = values in this interval not displayed.    Basic Metabolic Panel: Recent Labs  Lab 11/18/20 1728 11/18/20 1902 11/18/20 1911 11/19/20 0046 11/19/20 0613 11/20/20 0610 11/20/20 2046 11/21/20 0512  NA 134* 135   < > 136 137 132* 133* 131*  K 3.6 3.6   < > 3.2* 3.3* 3.2* 3.4* 3.8  CL 93* 94*  --  102 102 101  --  102  CO2 24  --   --  26 24 20*  --  21*  GLUCOSE 378* 361*  --  123* 167* 230*  --  198*  BUN 28* 38*  --  22 21 18   --  21  CREATININE 1.02 0.60*  --  0.69 0.74 0.71  --  0.90  CALCIUM 8.9  --   --  8.1* 8.2* 8.1*  --  7.9*   < > = values in this interval not displayed.   GFR: Estimated Creatinine Clearance: 83.1 mL/min (by C-G formula based on SCr of 0.9 mg/dL). Recent Labs  Lab 11/18/20 2209 11/19/20 0613 11/20/20 0610 11/21/20 0512  WBC 39.4* 39.6* 32.3* 31.8*    Liver Function Tests: Recent Labs  Lab 11/21/20 0512  AST 26  ALT 22  ALKPHOS 191*  BILITOT 0.7  PROT 5.4*  ALBUMIN 1.2*   No results for input(s): LIPASE, AMYLASE in the last 168 hours. No results for input(s): AMMONIA in  the last 168 hours.  ABG    Component Value Date/Time   PHART 7.452 (H) 11/20/2020 2046   PCO2ART 28.8 (L) 11/20/2020 2046   PO2ART 106 11/20/2020 2046   HCO3 20.2 11/20/2020 2046   TCO2 21 (L) 11/20/2020 2046   ACIDBASEDEF 3.0 (H) 11/20/2020 2046   O2SAT 99.0 11/20/2020 2046     Coagulation Profile: No results for input(s): INR, PROTIME in the last 168 hours.  Cardiac Enzymes: No results for input(s): CKTOTAL, CKMB, CKMBINDEX, TROPONINI in the last 168 hours.  HbA1C: Hgb A1c MFr Bld  Date/Time Value Ref Range Status  11/19/2020 06:13 AM 13.9 (H) 4.8 - 5.6 % Final    Comment:    (NOTE) Pre diabetes:          5.7%-6.4%  Diabetes:              >6.4%  Glycemic control for   <  7.0% adults with diabetes     CBG: Recent Labs  Lab 11/20/20 1103 11/20/20 2047 11/21/20 0034 11/21/20 0511 11/21/20 0739  GLUCAP 221* 187* 218* 183* 185*    Total critical care time: 42 minutes  Performed by: Cheri Fowler   Critical care time was exclusive of separately billable procedures and treating other patients.   Critical care was necessary to treat or prevent imminent or life-threatening deterioration.   Critical care was time spent personally by me on the following activities: development of treatment plan with patient and/or surrogate as well as nursing, discussions with consultants, evaluation of patient's response to treatment, examination of patient, obtaining history from patient or surrogate, ordering and performing treatments and interventions, ordering and review of laboratory studies, ordering and review of radiographic studies, pulse oximetry and re-evaluation of patient's condition.   Cheri Fowler MD [facility] See Amion for pager If no response to pager, please call [phone] until 7pm After 7pm, Please call E-link [phone]

## 2020-11-21 NOTE — Anesthesia Procedure Notes (Signed)
Central Venous Catheter Insertion Performed by: Kipp Brood, MD, anesthesiologist Start/End3/25/2022 7:35 PM, 11/20/2020 7:40 PM Patient location: OR. Preanesthetic checklist: patient identified, IV checked, site marked, risks and benefits discussed, surgical consent, monitors and equipment checked, pre-op evaluation, timeout performed and anesthesia consent Lidocaine 1% used for infiltration and patient sedated Hand hygiene performed  and maximum sterile barriers used  Catheter size: 8 Fr Total catheter length 16. Central line was placed.Double lumen Procedure performed using ultrasound guided technique. Ultrasound Notes:anatomy identified, needle tip was noted to be adjacent to the nerve/plexus identified, no ultrasound evidence of intravascular and/or intraneural injection and image(s) printed for medical record Attempts: 1 Following insertion, dressing applied and line sutured. Post procedure assessment: blood return through all ports  Patient tolerated the procedure well with no immediate complications.

## 2020-11-21 NOTE — Progress Notes (Signed)
[facility]    Date of Admission:  11/18/2020   Total days of antibiotics 4/vanco and cefazolin           ID: Daniel Cline is a 65 y.o. male with   cervical SEA POD#3 s/p laminectomy C3-7 and POD#1 s/p right forearm abscess debridement. Neurologically stable Principal Problem:   Epidural intraspinal abscess Active Problems:   Abscess   Pressure injury of skin   Hypertension associated with diabetes (HCC)   Staph aureus infection    Subjective: Underwent right forearm I x D without complications yesterday. cx showing GPC. He denies any significant pain. Remains afebrile  Medications:  . amLODipine  10 mg Oral Daily  . chlorhexidine gluconate (MEDLINE KIT)  15 mL Mouth Rinse BID  . Chlorhexidine Gluconate Cloth  6 each Topical Daily  . docusate  100 mg Per Tube BID  . insulin aspart  0-15 Units Subcutaneous TID WC  . insulin aspart  0-5 Units Subcutaneous QHS  . insulin detemir  18 Units Subcutaneous Q12H  . labetalol  10 mg Intravenous Q6H  . liver oil-zinc oxide   Topical BID  . mouth rinse  15 mL Mouth Rinse 10 times per day  . pantoprazole  40 mg Oral QHS  . polyethylene glycol  17 g Per Tube Daily  . sodium chloride flush  10-40 mL Intracatheter Q12H  . sodium chloride flush  3 mL Intravenous Q12H    Objective: Vital signs in last 24 hours: Temp:  [98 F (36.7 C)-99.3 F (37.4 C)] 98.5 F (36.9 C) (03/26 1158) Pulse Rate:  [80-104] 97 (03/26 1400) Resp:  [14-29] 17 (03/26 1400) BP: (132-219)/(65-98) 153/75 (03/26 1400) SpO2:  [88 %-97 %] 96 % (03/26 1400) FiO2 (%):  [50 %-100 %] 50 % (03/26 1200) Physical Exam  Constitutional: He is oriented to person, place,He appears well-developed and well-nourished. No distress.  HENT:  Mouth/Throat: Oett in place Cardiovascular: Normal rate, regular rhythm and normal heart sounds. Exam reveals no gallop and no friction rub.  No murmur heard.  Pulmonary/Chest: Effort normal and breath  sounds normal. No respiratory distress. He has no wheezes.  Abdominal: Soft. Bowel sounds are normal. He exhibits no distension. There is no tenderness.  OZD:GUYQI forearm wrapped Neurological: He is alert and oriented to person, place, Skin: Skin is warm and dry. No rash noted. No erythema.  Psychiatric: He has a normal mood and affect. His behavior is normal.     Lab Results Recent Labs    11/20/20 0610 11/20/20 2046 11/21/20 0512  WBC 32.3*  --  31.8*  HGB 12.4* 11.2* 11.4*  HCT 36.9* 33.0* 34.8*  NA 132* 133* 131*  K 3.2* 3.4* 3.8  CL 101  --  102  CO2 20*  --  21*  BUN 18  --  21  CREATININE 0.71  --  0.90   Liver Panel Recent Labs    11/21/20 0512  PROT 5.4*  ALBUMIN 1.2*  AST 26  ALT 22  ALKPHOS 191*  BILITOT 0.7   No results found for: ESRSEDRATE, POCTSEDRATE  Microbiology: 3/25 right forearm cx: staph aureus 3/23 blood cx ngtd 3/23 cervical abscess Staphylococcus aureus      MIC    CIPROFLOXACIN <=0.5 SENSI... Sensitive    CLINDAMYCIN <=0.25 SENS... Sensitive    ERYTHROMYCIN <=0.25 SENS... Sensitive    GENTAMICIN <=0.5 SENSI... Sensitive    Inducible Clindamycin NEGATIVE  Sensitive    OXACILLIN <=0.25 SENS.Marland KitchenMarland Kitchen  Sensitive    RIFAMPIN <=0.5 SENSI... Sensitive    TETRACYCLINE <=1 SENSITIVE  Sensitive    TRIMETH/SULFA <=10 SENSIT... Sensitive    VANCOMYCIN <=0.5 SENSI... Sensitive     Studies/Results: DG Abd 1 View  Result Date: 11/21/2020 CLINICAL DATA:  OG tube placement. EXAM: ABDOMEN - 1 VIEW COMPARISON:  Film earlier this day FINDINGS: OG tube tip is identified overlying the mid stomach. No other significant changes noted. IMPRESSION: OG tube with tip overlying the mid stomach. Electronically Signed   By: Margarette Canada M.D.   On: 11/21/2020 12:11   DG Abd 1 View  Result Date: 11/21/2020 CLINICAL DATA:  OG tube placement EXAM: ABDOMEN - 1 VIEW COMPARISON:  None. FINDINGS: Esophagogastric tube is positioned with tip below the diaphragm, side port  above the gastroesophageal junction. Left pleural effusion. Gas-filled bowel in the included upper abdomen. IMPRESSION: 1. Esophagogastric tube is positioned with tip below the diaphragm, side port above the gastroesophageal junction. Recommend advancement to ensure subdiaphragmatic position. 2. Left pleural effusion. Electronically Signed   By: Eddie Candle M.D.   On: 11/21/2020 11:04   CT FOREARM RIGHT W CONTRAST  Result Date: 11/20/2020 CLINICAL DATA:  Right forearm pain.  Fever.  Infection suspected EXAM: CT OF THE UPPER RIGHT EXTREMITY WITH CONTRAST TECHNIQUE: Multidetector CT imaging of the upper right extremity was performed according to the standard protocol following intravenous contrast administration. CONTRAST:  151m OMNIPAQUE IOHEXOL 300 MG/ML  SOLN COMPARISON:  None. FINDINGS: Bones/Joint/Cartilage No acute fracture. No dislocation. No bony erosion or periosteal elevation. Mild-to-moderate arthropathy of the right elbow with subchondral cystic changes in the capitellum. No appreciable elbow joint effusion. Well-circumscribed cystic structure within the triquetrum, likely an intraosseous ganglion. No definite radiocarpal joint effusion. Ligaments Suboptimally assessed by CT. Muscles and Tendons Elongated area of relative decreased attenuation within the flexor compartment musculature of the proximal forearm, which appears predominantly located within the brachioradialis muscle and possibly a portion of the extensor carpi radialis longus muscle measuring approximately 3.2 x 1.2 x 14.0 cm (see series 6, images 1-83). No well-defined enhancing rim. Remaining musculotendinous structures appear grossly intact. No appreciable tenosynovial fluid collections at the level of the wrist. Soft tissues Circumferential subcutaneous edema. No fluid collections are evident within the subcutaneous soft tissues. No soft tissue gas. IMPRESSION: 1. Elongated area of relative decreased attenuation within the flexor  compartment musculature of the proximal to mid forearm, which appears predominantly located within the brachioradialis muscle and possibly a portion of the extensor carpi radialis longus muscle measuring approximately 3.2 x 1.2 x 14.0 cm. No well-defined enhancing rim. Findings are nonspecific and could reflect myositis and possibly developing intramuscular phlegmon or abscess. Consider further evaluation with ultrasound or MRI to assess for a fluid component. 2. Circumferential subcutaneous edema, which may represent cellulitis. No fluid collections within the subcutaneous soft tissues. No soft tissue gas. 3. No acute osseous abnormality. Electronically Signed   By: NDavina PokeD.O.   On: 11/20/2020 11:49   DG Chest Port 1 View  Result Date: 11/20/2020 CLINICAL DATA:  Status post central line placement EXAM: PORTABLE CHEST 1 VIEW COMPARISON:  11/19/2020 FINDINGS: Left jugular central line is noted with catheter tip at the proximal superior vena cava. No pneumothorax is noted. Endotracheal tube is seen in satisfactory position. Cardiac shadow is prominent but stable. Lungs are hypoinflated with increasing opacity in the left base. No bony abnormality is seen. IMPRESSION: Tubes and lines as described. Hypoinflation with increasing left basilar opacity. Electronically Signed  By: Inez Catalina M.D.   On: 11/20/2020 20:42   ECHOCARDIOGRAM COMPLETE  Result Date: 11/20/2020    ECHOCARDIOGRAM REPORT   Patient Name:   Daniel Cline Date of Exam: 11/20/2020 Medical Rec #:  [id number]          Height:       66.0 in Accession #:    [id number]         Weight:       185.0 lb Date of Birth:  1956/02/02         BSA:          1.935 m Patient Age:    46 years           BP:           167/60 mmHg Patient Gender: M                  HR:           93 bpm. Exam Location:  Inpatient Procedure: 2D Echo, Intracardiac Opacification Agent, Color Doppler and Cardiac            Doppler Indications:    AI  History:         Patient has no prior history of Echocardiogram examinations.                 Risk Factors:Diabetes.  Sonographer:    Luisa Hart RDCS Referring Phys: [place] Comments: Suboptimal apical window. IMPRESSIONS  1. Left ventricular ejection fraction, by estimation, is 35 to 40%. The left ventricle has moderately decreased function. The left ventricle demonstrates global hypokinesis. There is mild concentric left ventricular hypertrophy. Left ventricular diastolic parameters are consistent with Grade II diastolic dysfunction (pseudonormalization). Elevated left atrial pressure.  2. Right ventricular systolic function is normal. The right ventricular size is normal.  3. The mitral valve is normal in structure. Mild mitral valve regurgitation. No evidence of mitral stenosis.  4. The aortic valve is normal in structure. Aortic valve regurgitation is present, unable to assess severity due to poor technical quality of the study. Suspect mild regurgitation. No aortic stenosis is present.  5. The inferior vena cava is normal in size with greater than 50% respiratory variability, suggesting right atrial pressure of 3 mmHg. Conclusion(s)/Recommendation(s): No evidence of valvular vegetations on this transthoracic echocardiogram. Unable to reliably assess the degree of aortic insufficiency, but it does not appear to be severe. Would recommend a transesophageal echocardiogram  to exclude infective endocarditis if clinically indicated. However, this will need to be performed as a delayed study following recent cervical spine surgery. FINDINGS  Left Ventricle: Left ventricular ejection fraction, by estimation, is 35 to 40%. The left ventricle has moderately decreased function. The left ventricle demonstrates global hypokinesis. Definity contrast agent was given IV to delineate the left ventricular endocardial borders. The left ventricular internal cavity size was normal in size. There is mild concentric left  ventricular hypertrophy. Left ventricular diastolic parameters are consistent with Grade II diastolic dysfunction (pseudonormalization). Elevated left atrial pressure. Right Ventricle: The right ventricular size is normal. No increase in right ventricular wall thickness. Right ventricular systolic function is normal. Left Atrium: Left atrial size was normal in size. Right Atrium: Right atrial size was normal in size. Pericardium: There is no evidence of pericardial effusion. Mitral Valve: The mitral valve is normal in structure. Mild mitral valve regurgitation. No evidence of mitral valve stenosis. MV peak gradient, 5.1 mmHg. The mean mitral  valve gradient is 2.0 mmHg. Tricuspid Valve: The tricuspid valve is normal in structure. Tricuspid valve regurgitation is not demonstrated. No evidence of tricuspid stenosis. Aortic Valve: The aortic valve is normal in structure. Aortic valve regurgitation is present, unable to assess severity due to poor technical quality of the study. Suspect mild regurgitation. No aortic stenosis is present. Aortic valve mean gradient measures 2.0 mmHg. Aortic valve peak gradient measures 3.5 mmHg. Aortic valve area, by VTI measures 2.50 cm. Pulmonic Valve: The pulmonic valve was normal in structure. Pulmonic valve regurgitation is not visualized. No evidence of pulmonic stenosis. Aorta: The aortic root is normal in size and structure. Venous: The inferior vena cava is normal in size with greater than 50% respiratory variability, suggesting right atrial pressure of 3 mmHg. IAS/Shunts: No atrial level shunt detected by color flow Doppler.  LEFT VENTRICLE PLAX 2D LVIDd:         4.80 cm  Diastology LVIDs:         4.10 cm  LV e' medial:    5.77 cm/s LV PW:         1.30 cm  LV E/e' medial:  18.0 LV IVS:        1.10 cm  LV e' lateral:   5.43 cm/s LVOT diam:     2.00 cm  LV E/e' lateral: 19.2 LV SV:         35 LV SV Index:   18 LVOT Area:     3.14 cm  RIGHT VENTRICLE RV S prime:     11.50 cm/s  TAPSE (M-mode): 1.4 cm LEFT ATRIUM             Index       RIGHT ATRIUM           Index LA diam:        3.40 cm 1.76 cm/m  RA Area:     11.50 cm LA Vol (A2C):   34.1 ml 17.63 ml/m RA Volume:   23.20 ml  11.99 ml/m LA Vol (A4C):   45.5 ml 23.52 ml/m LA Biplane Vol: 39.5 ml 20.42 ml/m  AORTIC VALVE                   PULMONIC VALVE AV Area (Vmax):    2.54 cm    PV Vmax:       0.79 m/s AV Area (Vmean):   2.70 cm    PV Vmean:      68.600 cm/s AV Area (VTI):     2.50 cm    PV VTI:        0.171 m AV Vmax:           92.90 cm/s  PV Peak grad:  2.5 mmHg AV Vmean:          61.600 cm/s PV Mean grad:  2.0 mmHg AV VTI:            0.142 m AV Peak Grad:      3.5 mmHg AV Mean Grad:      2.0 mmHg LVOT Vmax:         75.10 cm/s LVOT Vmean:        53.000 cm/s LVOT VTI:          0.113 m LVOT/AV VTI ratio: 0.80  AORTA Ao Root diam: 3.40 cm Ao Asc diam:  3.30 cm MITRAL VALVE MV Area (PHT): 6.12 cm     SHUNTS MV Area VTI:   1.61 cm  Systemic VTI:  0.11 m MV Peak grad:  5.1 mmHg     Systemic Diam: 2.00 cm MV Mean grad:  2.0 mmHg MV Vmax:       1.13 m/s MV Vmean:      62.5 cm/s MV Decel Time: 124 msec MR Peak grad: 89.5 mmHg MR Mean grad: 55.0 mmHg MR Vmax:      473.00 cm/s MR Vmean:     345.0 cm/s MV E velocity: 104.00 cm/s MV A velocity: 61.70 cm/s MV E/A ratio:  1.69 Mihai Croitoru MD Electronically signed by Sanda Klein MD Signature Date/Time: 11/20/2020/4:36:40 PM    Final      Assessment/Plan: Disseminated staph aureus infection = MSSA from SEA. - place on cefazolin 2gm IV Q 8hr plus vancomycin to ensure that right forearm abscess is also MSSA. Will narrow tomorrow.  Awaiting thoracic spine MRI to ensure no further drainable abscess to account for still having high WBC    [facility] Cell: [phone] Pager: [phone]  11/21/2020, 2:59 PM

## 2020-11-21 NOTE — Addendum Note (Signed)
Addendum  created 11/21/20 0736 by Kipp Brood, MD   Child order released for a procedure order, Clinical Note Signed, Intraprocedure Blocks edited

## 2020-11-21 NOTE — Progress Notes (Signed)
  NEUROSURGERY PROGRESS NOTE   Pt seen and examined. No issues overnight.   EXAM: Temp:  [98 F (36.7 C)-99.3 F (37.4 C)] 99.3 F (37.4 C) (03/26 0740) Pulse Rate:  [80-117] 91 (03/26 0800) Resp:  [14-27] 20 (03/26 0800) BP: (104-219)/(62-104) 157/69 (03/26 0800) SpO2:  [88 %-97 %] 92 % (03/26 0800) Arterial Line BP: (115-178)/(54-67) 138/64 (03/25 1200) FiO2 (%):  [60 %-100 %] 60 % (03/26 0800) Intake/Output      03/25 0701 03/26 0700 03/26 0701 03/27 0700   P.O. 600    I.V. (mL/kg) 1605.3 (19.1) 44 (0.5)   IV Piggyback 499.9    Total Intake(mL/kg) 2705.2 (32.2) 44 (0.5)   Urine (mL/kg/hr) 1000 (0.5) 100 (0.7)   Drains 40    Blood 10    Total Output 1050 100   Net +1655.2 -56         Intubated but awake, alert Nods appropriately to questions MAE to command, symetric Hemovac in place, ~40cc overnight  LABS: Lab Results  Component Value Date   CREATININE 0.90 11/21/2020   BUN 21 11/21/2020   NA 131 (L) 11/21/2020   K 3.8 11/21/2020   CL 102 11/21/2020   CO2 21 (L) 11/21/2020   Lab Results  Component Value Date   WBC 31.8 (H) 11/21/2020   HGB 11.4 (L) 11/21/2020   HCT 34.8 (L) 11/21/2020   MCV 94.3 11/21/2020   PLT 385 11/21/2020    IMPRESSION: - 65 y.o. male cervical SEA POD#3 s/p laminectomy C3-7 and POD#1 s/p right forearm abscess debridement. Neurologically stable  PLAN: - Will d/c cervical Hemovac today - Will need T-spine MRI  - wean to extubate per PCCM - Cont Vancomycin - OR cultures (+) S. Aureus   Lisbeth Renshaw, MD [facility]

## 2020-11-22 DIAGNOSIS — L0291 Cutaneous abscess, unspecified: Secondary | ICD-10-CM

## 2020-11-22 LAB — COMPREHENSIVE METABOLIC PANEL
ALT: 40 U/L (ref 0–44)
AST: 68 U/L — ABNORMAL HIGH (ref 15–41)
Albumin: 1.3 g/dL — ABNORMAL LOW (ref 3.5–5.0)
Alkaline Phosphatase: 203 U/L — ABNORMAL HIGH (ref 38–126)
Anion gap: 9 (ref 5–15)
BUN: 25 mg/dL — ABNORMAL HIGH (ref 8–23)
CO2: 21 mmol/L — ABNORMAL LOW (ref 22–32)
Calcium: 8.1 mg/dL — ABNORMAL LOW (ref 8.9–10.3)
Chloride: 104 mmol/L (ref 98–111)
Creatinine, Ser: 0.94 mg/dL (ref 0.61–1.24)
GFR, Estimated: >60 mL/min (ref >60)
Glucose, Bld: 106 mg/dL — ABNORMAL HIGH (ref 70–99)
Potassium: 3.6 mmol/L (ref 3.5–5.1)
Sodium: 134 mmol/L — ABNORMAL LOW (ref 135–145)
Total Bilirubin: 0.6 mg/dL (ref 0.3–1.2)
Total Protein: 5.8 g/dL — ABNORMAL LOW (ref 6.5–8.1)

## 2020-11-22 LAB — CBC
HCT: 34.6 % — ABNORMAL LOW (ref 39.0–52.0)
Hemoglobin: 11.4 g/dL — ABNORMAL LOW (ref 13.0–17.0)
MCH: 31.1 pg (ref 26.0–34.0)
MCHC: 32.9 g/dL (ref 30.0–36.0)
MCV: 94.3 fL (ref 80.0–100.0)
Platelets: 388 10*3/uL (ref 150–400)
RBC: 3.67 MIL/uL — ABNORMAL LOW (ref 4.22–5.81)
RDW: 12.9 % (ref 11.5–15.5)
WBC: 25.2 10*3/uL — ABNORMAL HIGH (ref 4.0–10.5)
nRBC: 0 % (ref 0.0–0.2)

## 2020-11-22 LAB — GLUCOSE, CAPILLARY
Glucose-Capillary: 116 mg/dL — ABNORMAL HIGH (ref 70–99)
Glucose-Capillary: 129 mg/dL — ABNORMAL HIGH (ref 70–99)
Glucose-Capillary: 71 mg/dL (ref 70–99)
Glucose-Capillary: 93 mg/dL (ref 70–99)
Glucose-Capillary: 94 mg/dL (ref 70–99)
Glucose-Capillary: 95 mg/dL (ref 70–99)

## 2020-11-22 LAB — POCT I-STAT 7, (LYTES, BLD GAS, ICA,H+H)
Acid-base deficit: 3 mmol/L — ABNORMAL HIGH (ref 0.0–2.0)
Bicarbonate: 19 mmol/L — ABNORMAL LOW (ref 20.0–28.0)
Calcium, Ion: 1.19 mmol/L (ref 1.15–1.40)
HCT: 33 % — ABNORMAL LOW (ref 39.0–52.0)
Hemoglobin: 11.2 g/dL — ABNORMAL LOW (ref 13.0–17.0)
O2 Saturation: 96 %
Patient temperature: 99.6
Potassium: 3.7 mmol/L (ref 3.5–5.1)
Sodium: 136 mmol/L (ref 135–145)
TCO2: 20 mmol/L — ABNORMAL LOW (ref 22–32)
pCO2 arterial: 24.2 mmHg — ABNORMAL LOW (ref 32.0–48.0)
pH, Arterial: 7.505 — ABNORMAL HIGH (ref 7.350–7.450)
pO2, Arterial: 77 mmHg — ABNORMAL LOW (ref 83.0–108.0)

## 2020-11-22 LAB — MAGNESIUM: Magnesium: 2 mg/dL (ref 1.7–2.4)

## 2020-11-22 LAB — PHOSPHORUS: Phosphorus: 4 mg/dL (ref 2.5–4.6)

## 2020-11-22 MED ORDER — NAFCILLIN SODIUM 2 G IJ SOLR
2.0000 g | INTRAMUSCULAR | Status: DC
  Filled 2020-11-22 (×2): qty 2000

## 2020-11-22 MED ORDER — SODIUM CHLORIDE 0.9 % IV SOLN
2.0000 g | INTRAVENOUS | Status: DC
  Administered 2020-11-22 – 2020-11-24 (×10): 2 g via INTRAVENOUS
  Filled 2020-11-22 (×17): qty 2000

## 2020-11-22 MED ORDER — ORAL CARE MOUTH RINSE
15.0000 mL | Freq: Two times a day (BID) | OROMUCOSAL | Status: DC
  Administered 2020-11-22 – 2020-11-28 (×12): 15 mL via OROMUCOSAL

## 2020-11-22 NOTE — Progress Notes (Signed)
ABG collected and results called to [name].

## 2020-11-22 NOTE — Progress Notes (Signed)
[facility] Physician-Brief Progress Note Patient Name: Daniel Cline DOB: November 30, 1955 MRN: [id number]   Date of Service  11/22/2020  HPI/Events of Note  ABG reviewed. resp alkalosis.   Discussed with RT  ICU notes, labs reviewed and compared to earlier.    eICU Interventions  Decreased rate from 18 to 16.  Wean down fio2 as tolerated for sats 88 % to 94% only. Prn ABG.      Intervention Category Intermediate Interventions: Diagnostic test evaluation  Ranee Gosselin 11/22/2020, 4:10 AM

## 2020-11-22 NOTE — Procedures (Signed)
Extubation Procedure Note  Patient Details:   Name: HINES KLOSS III DOB: 06-Dec-1955 MRN: [id number]   Airway Documentation:    Vent end date: 11/22/20 Vent end time: 0900   Evaluation  O2 sats: stable throughout Complications: No apparent complications Patient did tolerate procedure well. Bilateral Breath Sounds: Clear,Diminished   Yes   Pt was exhausted per MD order and placed on 3 L [place]. Cuff leak was noted prior to extubation and no stridor post. Pt is stable at this time. Rt will monitor.   Merlene Laughter 11/22/2020, 9:04 AM

## 2020-11-22 NOTE — Progress Notes (Signed)
ID PROGRESS NOTE  ID: [id number] M with cervical spinal epidural abscess s/p laminectomy and evacuation on 3/23, I xD of right forearm abscess on 3/25   24hr: underwent MRI of thoracic spine - did not show any thoracic epidural abscess, but thin epidural enhancement of lower ecerival spine to T1 level. Simple fluid collection 2.8 x 2.1 cm within laminectomy bed  Afebrile. Being evaluated for extubation  Micro : MSSA (arm abscess as well as epidural abscess cx)   A/P:  - change antibiotics to nafcillin 2gm q 4hr and watch kidney function - plan to treat for minimum of 4-6wk - will recommend follow up mri at end of treatment.   Duke Salvia Drue Second MD MPH [facility] [phone]

## 2020-11-22 NOTE — Progress Notes (Signed)
Subjective: Patient reports no acute events overnight. Plan for extubation today.   Objective: Vital signs in last 24 hours: Temp:  [98.5 F (36.9 C)-99.6 F (37.6 C)] 99.2 F (37.3 C) (03/27 0739) Pulse Rate:  [84-100] 96 (03/27 0828) Resp:  [17-27] 27 (03/27 0828) BP: (104-161)/(72-95) 153/92 (03/27 0828) SpO2:  [93 %-99 %] 94 % (03/27 0828) FiO2 (%):  [40 %-50 %] 40 % (03/27 0828)  Intake/Output from previous day: 03/26 0701 - 03/27 0700 In: 1124.9 [I.V.:524.9; IV Piggyback:600] Out: 1450 [Urine:1450] Intake/Output this shift: No intake/output data recorded.  Physical Exam: Patient is intubated and alert. He is following commands and nods appropriately. MAEW that is symmetric bilaterally.    Lab Results: Recent Labs    11/21/20 0512 11/22/20 0356 11/22/20 0604  WBC 31.8*  --  25.2*  HGB 11.4* 11.2* 11.4*  HCT 34.8* 33.0* 34.6*  PLT 385  --  388   BMET Recent Labs    11/21/20 0512 11/22/20 0356 11/22/20 0604  NA 131* 136 134*  K 3.8 3.7 3.6  CL 102  --  104  CO2 21*  --  21*  GLUCOSE 198*  --  106*  BUN 21  --  25*  CREATININE 0.90  --  0.94  CALCIUM 7.9*  --  8.1*    Studies/Results: DG Abd 1 View  Result Date: 11/21/2020 CLINICAL DATA:  OG tube placement. EXAM: ABDOMEN - 1 VIEW COMPARISON:  Film earlier this day FINDINGS: OG tube tip is identified overlying the mid stomach. No other significant changes noted. IMPRESSION: OG tube with tip overlying the mid stomach. Electronically Signed   By: Harmon Pier M.D.   On: 11/21/2020 12:11   DG Abd 1 View  Result Date: 11/21/2020 CLINICAL DATA:  OG tube placement EXAM: ABDOMEN - 1 VIEW COMPARISON:  None. FINDINGS: Esophagogastric tube is positioned with tip below the diaphragm, side port above the gastroesophageal junction. Left pleural effusion. Gas-filled bowel in the included upper abdomen. IMPRESSION: 1. Esophagogastric tube is positioned with tip below the diaphragm, side port above the gastroesophageal  junction. Recommend advancement to ensure subdiaphragmatic position. 2. Left pleural effusion. Electronically Signed   By: Lauralyn Primes M.D.   On: 11/21/2020 11:04   MR THORACIC SPINE W WO CONTRAST  Result Date: 11/21/2020 CLINICAL DATA:  Cervical epidural abscess. Patient is status post surgical evacuation and laminectomy EXAM: MRI THORACIC WITHOUT AND WITH CONTRAST TECHNIQUE: Multiplanar and multiecho pulse sequences of the thoracic spine were obtained without and with intravenous contrast. CONTRAST:  34mL GADAVIST GADOBUTROL 1 MMOL/ML IV SOLN COMPARISON:  Cervical spine MRI 11/18/2020 FINDINGS: Alignment:  Physiologic. Vertebrae: No fracture, evidence of discitis, or bone lesion. Cord: Normal signal and morphology. No thoracic epidural fluid collection. Thin epidural enhancement of the visualized lower cervical spine extending inferiorly to the T1-T2 level (series 11, image 7) without evidence of residual epidural fluid collection. Paraspinal and other soft tissues: Postsurgical changes within the posterior paraspinal soft tissues of the lower cervical spine are partially imaged at the edge of the field of view. Lower cervical posterior decompression. Simple appearing fluid collection at the laminectomy bed within the lower cervical spine seen only on sagittal sequences at the edge of the field of view measuring approximately 2.8 x 2.1 cm (series 7, image 5). There is intramuscular edema within the paraspinal musculature of the lower cervical spine. Disc levels: Shallow disc protrusion at the T9-10 level without foraminal or canal stenosis. Remaining thoracic intervertebral discs are within normal limits.  There is no foraminal or canal stenosis at any level within the thoracic spine. IMPRESSION: 1. No thoracic epidural fluid collection. 2. Thin epidural enhancement of the visualized lower cervical spine extending inferiorly to the T1-T2 level without evidence of residual epidural fluid collection. Findings are  likely secondary to known infection and recent surgery. 3. Postsurgical changes within the posterior paraspinal soft tissues of the lower cervical spine are partially imaged at the edge of the field of view. Simple appearing fluid collection at the laminectomy bed within the lower cervical spine seen only on sagittal sequences at the edge of the field of view measuring approximately 2.8 x 2.1 cm. 4. Intramuscular edema within the paraspinal musculature of the lower cervical spine, which may reflect postsurgical change and/or myositis. Electronically Signed   By: Duanne Guess D.O.   On: 11/21/2020 16:31   CT FOREARM RIGHT W CONTRAST  Result Date: 11/20/2020 CLINICAL DATA:  Right forearm pain.  Fever.  Infection suspected EXAM: CT OF THE UPPER RIGHT EXTREMITY WITH CONTRAST TECHNIQUE: Multidetector CT imaging of the upper right extremity was performed according to the standard protocol following intravenous contrast administration. CONTRAST:  OMNIPAQUE IOHEXOL 300 MG/ML  SOLN COMPARISON:  None. FINDINGS: Bones/Joint/Cartilage No acute fracture. No dislocation. No bony erosion or periosteal elevation. Mild-to-moderate arthropathy of the right elbow with subchondral cystic changes in the capitellum. No appreciable elbow joint effusion. Well-circumscribed cystic structure within the triquetrum, likely an intraosseous ganglion. No definite radiocarpal joint effusion. Ligaments Suboptimally assessed by CT. Muscles and Tendons Elongated area of relative decreased attenuation within the flexor compartment musculature of the proximal forearm, which appears predominantly located within the brachioradialis muscle and possibly a portion of the extensor carpi radialis longus muscle measuring approximately 3.2 x 1.2 x 14.0 cm (see series 6, images 1-83). No well-defined enhancing rim. Remaining musculotendinous structures appear grossly intact. No appreciable tenosynovial fluid collections at the level of the wrist.  Soft tissues Circumferential subcutaneous edema. No fluid collections are evident within the subcutaneous soft tissues. No soft tissue gas. IMPRESSION: 1. Elongated area of relative decreased attenuation within the flexor compartment musculature of the proximal to mid forearm, which appears predominantly located within the brachioradialis muscle and possibly a portion of the extensor carpi radialis longus muscle measuring approximately 3.2 x 1.2 x 14.0 cm. No well-defined enhancing rim. Findings are nonspecific and could reflect myositis and possibly developing intramuscular phlegmon or abscess. Consider further evaluation with ultrasound or MRI to assess for a fluid component. 2. Circumferential subcutaneous edema, which may represent cellulitis. No fluid collections within the subcutaneous soft tissues. No soft tissue gas. 3. No acute osseous abnormality. Electronically Signed   By: Duanne Guess D.O.   On: 11/20/2020 11:49   DG Chest Port 1 View  Result Date: 11/20/2020 CLINICAL DATA:  Status post central line placement EXAM: PORTABLE CHEST 1 VIEW COMPARISON:  11/19/2020 FINDINGS: Left jugular central line is noted with catheter tip at the proximal superior vena cava. No pneumothorax is noted. Endotracheal tube is seen in satisfactory position. Cardiac shadow is prominent but stable. Lungs are hypoinflated with increasing opacity in the left base. No bony abnormality is seen. IMPRESSION: Tubes and lines as described. Hypoinflation with increasing left basilar opacity. Electronically Signed   By: Alcide Clever M.D.   On: 11/20/2020 20:42   ECHOCARDIOGRAM COMPLETE  Result Date: 11/20/2020    ECHOCARDIOGRAM REPORT   Patient Name:   Daniel Cline Date of Exam: 11/20/2020 Medical Rec #:  [id number]  Height:       66.0 in Accession #:    [id number]         Weight:       185.0 lb Date of Birth:  08/14/1956         BSA:          1.935 m Patient Age:    64 years           BP:           167/60 mmHg  Patient Gender: M                  HR:           93 bpm. Exam Location:  Inpatient Procedure: 2D Echo, Intracardiac Opacification Agent, Color Doppler and Cardiac            Doppler Indications:    AI  History:        Patient has no prior history of Echocardiogram examinations.                 Risk Factors:Diabetes.  Sonographer:    Neomia Dear RDCS Referring Phys: 51 Yojan S BYRUM  Sonographer Comments: Suboptimal apical window. IMPRESSIONS  1. Left ventricular ejection fraction, by estimation, is 35 to 40%. The left ventricle has moderately decreased function. The left ventricle demonstrates global hypokinesis. There is mild concentric left ventricular hypertrophy. Left ventricular diastolic parameters are consistent with Grade II diastolic dysfunction (pseudonormalization). Elevated left atrial pressure.  2. Right ventricular systolic function is normal. The right ventricular size is normal.  3. The mitral valve is normal in structure. Mild mitral valve regurgitation. No evidence of mitral stenosis.  4. The aortic valve is normal in structure. Aortic valve regurgitation is present, unable to assess severity due to poor technical quality of the study. Suspect mild regurgitation. No aortic stenosis is present.  5. The inferior vena cava is normal in size with greater than 50% respiratory variability, suggesting right atrial pressure of 3 mmHg. Conclusion(s)/Recommendation(s): No evidence of valvular vegetations on this transthoracic echocardiogram. Unable to reliably assess the degree of aortic insufficiency, but it does not appear to be severe. Would recommend a transesophageal echocardiogram  to exclude infective endocarditis if clinically indicated. However, this will need to be performed as a delayed study following recent cervical spine surgery. FINDINGS  Left Ventricle: Left ventricular ejection fraction, by estimation, is 35 to 40%. The left ventricle has moderately decreased function. The left ventricle  demonstrates global hypokinesis. Definity contrast agent was given IV to delineate the left ventricular endocardial borders. The left ventricular internal cavity size was normal in size. There is mild concentric left ventricular hypertrophy. Left ventricular diastolic parameters are consistent with Grade II diastolic dysfunction (pseudonormalization). Elevated left atrial pressure. Right Ventricle: The right ventricular size is normal. No increase in right ventricular wall thickness. Right ventricular systolic function is normal. Left Atrium: Left atrial size was normal in size. Right Atrium: Right atrial size was normal in size. Pericardium: There is no evidence of pericardial effusion. Mitral Valve: The mitral valve is normal in structure. Mild mitral valve regurgitation. No evidence of mitral valve stenosis. MV peak gradient, 5.1 mmHg. The mean mitral valve gradient is 2.0 mmHg. Tricuspid Valve: The tricuspid valve is normal in structure. Tricuspid valve regurgitation is not demonstrated. No evidence of tricuspid stenosis. Aortic Valve: The aortic valve is normal in structure. Aortic valve regurgitation is present, unable to assess severity due to poor technical quality of the study.  Suspect mild regurgitation. No aortic stenosis is present. Aortic valve mean gradient measures 2.0 mmHg. Aortic valve peak gradient measures 3.5 mmHg. Aortic valve area, by VTI measures 2.50 cm. Pulmonic Valve: The pulmonic valve was normal in structure. Pulmonic valve regurgitation is not visualized. No evidence of pulmonic stenosis. Aorta: The aortic root is normal in size and structure. Venous: The inferior vena cava is normal in size with greater than 50% respiratory variability, suggesting right atrial pressure of 3 mmHg. IAS/Shunts: No atrial level shunt detected by color flow Doppler.  LEFT VENTRICLE PLAX 2D LVIDd:         4.80 cm  Diastology LVIDs:         4.10 cm  LV e' medial:    5.77 cm/s LV PW:         1.30 cm  LV E/e'  medial:  18.0 LV IVS:        1.10 cm  LV e' lateral:   5.43 cm/s LVOT diam:     2.00 cm  LV E/e' lateral: 19.2 LV SV:         35 LV SV Index:   18 LVOT Area:     3.14 cm  RIGHT VENTRICLE RV S prime:     11.50 cm/s TAPSE (M-mode): 1.4 cm LEFT ATRIUM             Index       RIGHT ATRIUM           Index LA diam:        3.40 cm 1.76 cm/m  RA Area:     11.50 cm LA Vol (A2C):   34.1 ml 17.63 ml/m RA Volume:   23.20 ml  11.99 ml/m LA Vol (A4C):   45.5 ml 23.52 ml/m LA Biplane Vol: 39.5 ml 20.42 ml/m  AORTIC VALVE                   PULMONIC VALVE AV Area (Vmax):    2.54 cm    PV Vmax:       0.79 m/s AV Area (Vmean):   2.70 cm    PV Vmean:      68.600 cm/s AV Area (VTI):     2.50 cm    PV VTI:        0.171 m AV Vmax:           92.90 cm/s  PV Peak grad:  2.5 mmHg AV Vmean:          61.600 cm/s PV Mean grad:  2.0 mmHg AV VTI:            0.142 m AV Peak Grad:      3.5 mmHg AV Mean Grad:      2.0 mmHg LVOT Vmax:         75.10 cm/s LVOT Vmean:        53.000 cm/s LVOT VTI:          0.113 m LVOT/AV VTI ratio: 0.80  AORTA Ao Root diam: 3.40 cm Ao Asc diam:  3.30 cm MITRAL VALVE MV Area (PHT): 6.12 cm     SHUNTS MV Area VTI:   1.61 cm     Systemic VTI:  0.11 m MV Peak grad:  5.1 mmHg     Systemic Diam: 2.00 cm MV Mean grad:  2.0 mmHg MV Vmax:       1.13 m/s MV Vmean:      62.5 cm/s MV Decel Time: 124 msec MR Peak  grad: 89.5 mmHg MR Mean grad: 55.0 mmHg MR Vmax:      473.00 cm/s MR Vmean:     345.0 cm/s MV E velocity: 104.00 cm/s MV A velocity: 61.70 cm/s MV E/A ratio:  1.69 Mihai Croitoru MD Electronically signed by Thurmon Fair MD Signature Date/Time: 11/20/2020/4:36:40 PM    Final     Assessment/Plan: 65 y.o. male cervical SEA POD#3 s/p laminectomy C3-7 and POD#2 s/p right forearm abscess debridement. He continues to be neurologically stable. Plan for extubation today. Cont Vancomycin - OR cultures (+) S. Aureus. Thoracic MRI completed.     LOS: 4 days     Council Mechanic, DNP, NP-C 11/22/2020, 8:32  AM

## 2020-11-22 NOTE — Progress Notes (Signed)
NAME:  Daniel Cline, MRN:  [id number], DOB:  Aug 27, 1956, LOS: 4 ADMISSION DATE:  11/18/2020, CONSULTATION DATE:  11/18/2020 REFERRING MD:  Julio Sicks, CHIEF COMPLAINT:  Neck Pain   History of Present Illness:  64yM with DM who presented today to Neurosurgery clinic with severe neck pain with weakness in LUE. Much of history is obtained through chart review as he is drowsy at atime of my evaluation in PACU. He had a fall a week PTA and since then ahd increasing neck pain and weakness in LUE which has gradually worsened.   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 He underwent laminectomy/abscess evacuation today  Pertinent  Medical History  DM HTN  Significant Hospital Events: Including procedures, antibiotic start and stop dates in addition to other pertinent events   . 11/18/20 Laminectomy/abscess evacuation . 11/20/2020 I& D of right shoulder by orthopedics . ETT 3/25 >> 3/27  Interim History / Subjective:   Patient had MRI of thoracic spine, suggestive of slight extension to T1/T2, no obvious collection otherwise Tolerating pressure support trial  Objective   Blood pressure (!) 155/96, pulse (!) 105, temperature 99.2 F (37.3 C), temperature source Oral, resp. rate (!) 28, height 5\' 6"  (1.676 m), weight 83.9 kg, SpO2 95 %.    Vent Mode: PSV;CPAP FiO2 (%):  [40 %-50 %] 40 % Set Rate:  [16 bmp-18 bmp] 16 bmp Vt Set:  [450 mL-510 mL] 510 mL PEEP:  [5 cmH20-10 cmH20] 5 cmH20 Pressure Support:  [8 cmH20] 8 cmH20 Plateau Pressure:  [15 cmH20-23 cmH20] 23 cmH20   Intake/Output Summary (Last 24 hours) at 11/22/2020 0912 Last data filed at 11/22/2020 0700 Gross per 24 hour  Intake 1080.89 ml  Output 1350 ml  Net -269.11 ml   Filed Weights   11/18/20 1728  Weight: 83.9 kg    Examination: General: Acutely ill appearing, male, hard of hearing, orally intubated HENT: Atraumatic,  normocephalic, moist mucous membranes, anicteric sclera.  ETT/OGT in place Lungs: Reduced air entry at the bases, no wheezes or rhonchi Cardiovascular: Regular rate and rhythm, no murmur Abdomen: soft, round, non-tender, NABS Extremities: Right arm is wrapped in dressing Neuro: Opens eyes with vocal stimuli, following simple commands, currently on restraints, antigravity in all 4 extremities  Skin: No rash  Labs/imaging that I have personally reviewed   MRI 11/18/20: paraspinal loculated fluid collections c1-c7 communicating with extensive epidural collection from c2 to upper t-spine, mass effect on cord c5-c7  Wound Gram Stain 11/19/20: Abundant GPCs  R Forearm CT 11/19/20: circumferential subcutaneous edema, decreased attenuation within flexor compartment musculature proximal to the mid forearm  MRI thoracic spine 3/26: No thoracic epidural fluid collection. Thin epidural enhancement of the visualized lower cervical spine extending inferiorly to the T1-T2 level without evidence of residual epidural fluid collection. Findings are likely secondary to known infection and recent surgery. 3. Postsurgical changes within the posterior paraspinal soft tissues of the lower cervical spine are partially imaged at the edge of the field of view. Simple appearing fluid collection at the laminectomy bed within the lower cervical spine seen only on sagittal sequences at the edge of the field of view measuring approximately 2.8 x 2.1 cm. 4. Intramuscular edema within the paraspinal musculature of the lower cervical spine, which may reflect postsurgical change and/or myositis.  Resolved Hospital Problem list     Assessment & Plan:  Acute hypoxic respiratory failure s/p laminectomy/cervical epidural abscess evacuation  right arm I&D Sepsis due to MSSA epidural and right arm abscess Patient remained on mechanical ventilator Tolerating pressure support trial, will try to see if we can extubate him today Watch  for respiratory distress ID follow-up is appreciated Continue IV vancomycin, IV cefazolin was added Wound culture grew MSSA Continue neurochecks  Poorly controlled Diabetes Mellitus with hyperglycemia A1c 13.9 CBGs are better controlled today Continue Levemir to 18 units twice daily Continue sliding scale Monitor fingerstick with goal 140-180  HTN, uncontrolled Patient blood pressure still remain uncontrolled Cardene infusion was titrated off Continue amlodipine 10 mg once daily Continue labetalol 10 mg every 6 hours, will restart more oral meds once patient gets extubated Monitor blood pressure, try to taper off Cardene  Hyponatremia/hypokalemia Supplement electrolytes and closely monitor  Best practice (evaluated daily)  Diet: Hold tube feeds for possible extubation Pain/Anxiety/Delirium protocol (if indicated): Norco, Dilaudid, morphine PRN VAP protocol (if indicated): Yes DVT prophylaxis: SCD GI prophylaxis: N/A Glucose control:  SSI Yes Central venous access: Yes, will try to remove it today Arterial line: No Foley:  N/A Mobility: As tolerated PT consulted: Yes Last date of multidisciplinary goals of care discussion: Per primary team.  Tried to call patient's son, no answer Code Status:  full code Disposition: ICU  Labs   CBC: Recent Labs  Lab 11/18/20 2209 11/19/20 0613 11/20/20 0610 11/20/20 2046 11/21/20 0512 11/22/20 0356 11/22/20 0604  WBC 39.4* 39.6* 32.3*  --  31.8*  --  25.2*  NEUTROABS 34.6*  --   --   --   --   --   --   HGB 14.8 12.9* 12.4* 11.2* 11.4* 11.2* 11.4*  HCT 45.2 38.0* 36.9* 33.0* 34.8* 33.0* 34.6*  MCV 93.4 92.5 92.9  --  94.3  --  94.3  PLT 414* 377 407*  --  385  --  388    Basic Metabolic Panel: Recent Labs  Lab 11/19/20 0046 11/19/20 0613 11/20/20 0610 11/20/20 2046 11/21/20 0512 11/22/20 0356 11/22/20 0604  NA 136 137 132* 133* 131* 136 134*  K 3.2* 3.3* 3.2* 3.4* 3.8 3.7 3.6  CL 102 102 101  --  102  --  104   CO2 26 24 20*  --  21*  --  21*  GLUCOSE 123* 167* 230*  --  198*  --  106*  BUN 22 21 18   --  21  --  25*  CREATININE 0.69 0.74 0.71  --  0.90  --  0.94  CALCIUM 8.1* 8.2* 8.1*  --  7.9*  --  8.1*  MG  --   --   --   --   --   --  2.0  PHOS  --   --   --   --   --   --  4.0   GFR: Estimated Creatinine Clearance: 79.6 mL/min (by C-G formula based on SCr of 0.94 mg/dL). Recent Labs  Lab 11/19/20 0613 11/20/20 0610 11/21/20 0512 11/22/20 0604  WBC 39.6* 32.3* 31.8* 25.2*    Liver Function Tests: Recent Labs  Lab 11/21/20 0512 11/22/20 0604  AST 26 68*  ALT 22 40  ALKPHOS 191* 203*  BILITOT 0.7 0.6  PROT 5.4* 5.8*  ALBUMIN 1.2* 1.3*   No results for input(s): LIPASE, AMYLASE in the last 168 hours. No results for input(s): AMMONIA in the last 168 hours.  ABG    Component Value Date/Time   PHART 7.505 (H) 11/22/2020 0356   PCO2ART 24.2 (L) 11/22/2020 11/24/2020  PO2ART 77 (L) 11/22/2020 0356   HCO3 19.0 (L) 11/22/2020 0356   TCO2 20 (L) 11/22/2020 0356   ACIDBASEDEF 3.0 (H) 11/22/2020 0356   O2SAT 96.0 11/22/2020 0356     Coagulation Profile: No results for input(s): INR, PROTIME in the last 168 hours.  Cardiac Enzymes: No results for input(s): CKTOTAL, CKMB, CKMBINDEX, TROPONINI in the last 168 hours.  HbA1C: Hgb A1c MFr Bld  Date/Time Value Ref Range Status  11/19/2020 06:13 AM 13.9 (H) 4.8 - 5.6 % Final    Comment:    (NOTE) Pre diabetes:          5.7%-6.4%  Diabetes:              >6.4%  Glycemic control for   <7.0% adults with diabetes     CBG: Recent Labs  Lab 11/21/20 1618 11/21/20 1935 11/22/20 0029 11/22/20 0603 11/22/20 0738  GLUCAP 131* 123* 116* 95 93    Total critical care time: 39 minutes  Performed by: Cheri Fowler   Critical care time was exclusive of separately billable procedures and treating other patients.   Critical care was necessary to treat or prevent imminent or life-threatening deterioration.   Critical care was  time spent personally by me on the following activities: development of treatment plan with patient and/or surrogate as well as nursing, discussions with consultants, evaluation of patient's response to treatment, examination of patient, obtaining history from patient or surrogate, ordering and performing treatments and interventions, ordering and review of laboratory studies, ordering and review of radiographic studies, pulse oximetry and re-evaluation of patient's condition.   Cheri Fowler MD [facility] See Amion for pager If no response to pager, please call [phone] until 7pm After 7pm, Please call E-link [phone]

## 2020-11-23 ENCOUNTER — Inpatient Hospital Stay: Payer: Self-pay

## 2020-11-23 DIAGNOSIS — Z452 Encounter for adjustment and management of vascular access device: Secondary | ICD-10-CM

## 2020-11-23 DIAGNOSIS — B9561 Methicillin susceptible Staphylococcus aureus infection as the cause of diseases classified elsewhere: Secondary | ICD-10-CM

## 2020-11-23 LAB — COMPREHENSIVE METABOLIC PANEL
ALT: 146 U/L — ABNORMAL HIGH (ref 0–44)
AST: 236 U/L — ABNORMAL HIGH (ref 15–41)
Albumin: 1.3 g/dL — ABNORMAL LOW (ref 3.5–5.0)
Alkaline Phosphatase: 273 U/L — ABNORMAL HIGH (ref 38–126)
Anion gap: 10 (ref 5–15)
BUN: 22 mg/dL (ref 8–23)
CO2: 22 mmol/L (ref 22–32)
Calcium: 7.8 mg/dL — ABNORMAL LOW (ref 8.9–10.3)
Chloride: 105 mmol/L (ref 98–111)
Creatinine, Ser: 0.9 mg/dL (ref 0.61–1.24)
GFR, Estimated: >60 mL/min (ref >60)
Glucose, Bld: 118 mg/dL — ABNORMAL HIGH (ref 70–99)
Potassium: 3.1 mmol/L — ABNORMAL LOW (ref 3.5–5.1)
Sodium: 137 mmol/L (ref 135–145)
Total Bilirubin: 2.4 mg/dL — ABNORMAL HIGH (ref 0.3–1.2)
Total Protein: 6.1 g/dL — ABNORMAL LOW (ref 6.5–8.1)

## 2020-11-23 LAB — AEROBIC/ANAEROBIC CULTURE W GRAM STAIN (SURGICAL/DEEP WOUND)

## 2020-11-23 LAB — GLUCOSE, CAPILLARY
Glucose-Capillary: 207 mg/dL — ABNORMAL HIGH (ref 70–99)
Glucose-Capillary: 177 mg/dL — ABNORMAL HIGH (ref 70–99)
Glucose-Capillary: 78 mg/dL (ref 70–99)
Glucose-Capillary: 141 mg/dL — ABNORMAL HIGH (ref 70–99)
Glucose-Capillary: 227 mg/dL — ABNORMAL HIGH (ref 70–99)
Glucose-Capillary: 248 mg/dL — ABNORMAL HIGH (ref 70–99)

## 2020-11-23 LAB — CULTURE, BLOOD (ROUTINE X 2)
Culture: NO GROWTH
Culture: NO GROWTH
Special Requests: ADEQUATE
Special Requests: ADEQUATE

## 2020-11-23 MED ORDER — METOPROLOL TARTRATE 50 MG PO TABS
50.0000 mg | ORAL_TABLET | Freq: Two times a day (BID) | ORAL | Status: DC
  Administered 2020-11-23 – 2020-11-28 (×11): 50 mg via ORAL
  Filled 2020-11-23 (×11): qty 1

## 2020-11-23 MED ORDER — POLYETHYLENE GLYCOL 3350 17 G PO PACK
17.0000 g | PACK | Freq: Every day | ORAL | Status: DC
  Administered 2020-11-23: 17 g via ORAL
  Filled 2020-11-23: qty 1

## 2020-11-23 MED ORDER — POTASSIUM CHLORIDE 20 MEQ PO PACK
40.0000 meq | PACK | Freq: Once | ORAL | Status: AC
  Administered 2020-11-23: 40 meq via ORAL
  Filled 2020-11-23: qty 2

## 2020-11-23 MED ORDER — SODIUM CHLORIDE 0.9% FLUSH
10.0000 mL | INTRAVENOUS | Status: DC | PRN
  Administered 2020-11-23 – 2020-11-28 (×2): 10 mL

## 2020-11-23 MED ORDER — POTASSIUM CHLORIDE CRYS ER 20 MEQ PO TBCR
40.0000 meq | EXTENDED_RELEASE_TABLET | Freq: Once | ORAL | Status: AC
  Administered 2020-11-23: 40 meq via ORAL
  Filled 2020-11-23: qty 2

## 2020-11-23 MED ORDER — FUROSEMIDE 10 MG/ML IJ SOLN
20.0000 mg | Freq: Once | INTRAMUSCULAR | Status: AC
  Administered 2020-11-23: 20 mg via INTRAVENOUS
  Filled 2020-11-23: qty 2

## 2020-11-23 MED ORDER — INSULIN DETEMIR 100 UNIT/ML ~~LOC~~ SOLN
10.0000 [IU] | Freq: Every day | SUBCUTANEOUS | Status: DC
  Administered 2020-11-23 – 2020-11-27 (×5): 10 [IU] via SUBCUTANEOUS
  Filled 2020-11-23 (×7): qty 0.1

## 2020-11-23 MED ORDER — SODIUM CHLORIDE 0.9% FLUSH
10.0000 mL | Freq: Two times a day (BID) | INTRAVENOUS | Status: DC
  Administered 2020-11-23 – 2020-11-28 (×8): 10 mL

## 2020-11-23 MED ORDER — SENNOSIDES-DOCUSATE SODIUM 8.6-50 MG PO TABS
1.0000 | ORAL_TABLET | Freq: Two times a day (BID) | ORAL | Status: DC
  Administered 2020-11-23 – 2020-11-28 (×11): 1 via ORAL
  Filled 2020-11-23 (×11): qty 1
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
## 2020-11-23 NOTE — Progress Notes (Signed)
[facility] PCCM  Rising LFT's, discussed with pharmacy this morning and there were no clear causes for this. GGT pending Will check RUQ ultrasound  Heber Marrowbone, MD [facility] Pager: [phone] Cell: [phone] If no response, please call [phone] until 7pm After 7:00 pm call Elink  [phone]

## 2020-11-23 NOTE — Plan of Care (Signed)
  Problem: Education: Goal: Knowledge of General Education information will improve Description Including pain rating scale, medication(s)/side effects and non-pharmacologic comfort measures Outcome: Progressing   Problem: Health Behavior/Discharge Planning: Goal: Ability to manage health-related needs will improve Outcome: Progressing   

## 2020-11-23 NOTE — Progress Notes (Signed)
Attempted report X1 to 3W RN. Will attempt to call in 15 min.

## 2020-11-23 NOTE — Progress Notes (Signed)
Subjective: Patient is a 65 year old male 3 days status post right volar forearm abscess irrigation and debridement. Culture revealed MSSA   Patient reports pain as mild to moderate.  Reports pain with wrist motion. Denies numbness or tingling. Denies fever or chills.     Date of Surgery: 11/20/2020   INDICATIONS: Daniel Cline is a 65 y.o.-year-old male with a right volar forearm abscess.  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  This was managed with a posterior laminectomy decompression.  He was noted while in the ICU to have increasing pain and swelling of the right forearm.  CT scan did show a intramuscular abscess.  He was indicated for open irrigation and debridement.;  The patient and His son did consent to the procedure after discussion of the risks and benefits.   PREOPERATIVE DIAGNOSIS:  1.  Right forearm intramuscular abscess   POSTOPERATIVE DIAGNOSIS: Same.   PROCEDURE:  Incision and drainage of intramuscular abscess right forearm   SURGEON: Maryan Rued, M.D.  Objective:   VITALS:   Vitals:   11/23/20 0900 11/23/20 0956 11/23/20 1000 11/23/20 1059  BP: 132/78 139/85 128/72   Pulse: 86 89 87   Resp: 19  16   Temp:    97.8 F (36.6 C)  TempSrc:    Oral  SpO2: 96%  96%   Weight:      Height:         Right Upper Extremity:  Appearance/Alignment: Dressing : small amount of drainage on proximal incision. Drain was present and removed. Cloudy red drainage expressed from the forearm, wound re-dressed with dry dressing.   Healing incision at the proximal third and distal third of the forearm with nylon sutures presentDrainage: estimated 4-5cc of drainage expressed, cloudy red, no frank purulence.. No Erythema   Normal alignment w/o rotational deformities.  Neurovascular: SILT. Brisk capillary refill. Radial Pulse 2+   SENSORY: sensation is intact to light touch in:  superficial radial nerve distribution (dorsal first web  space) median nerve distribution (tip of index finger)   ulnar nerve distribution (tip of small finger)     MOTOR:  + motor posterior interosseous nerve (thumb IP extension) + anterior interosseous nerve (thumb IP flexion, index finger DIP flexion) + radial nerve (wrist extension) + median nerve (palpable firing thenar mass) +ulnar nerve (palpable firing of the first dorsal interosseous muscle)  Able to flex and extend the wrist, able to make a composite fist,ROM slightly limited by swelling. Able to flex and extend elbow.   Lab Results  Component Value Date   WBC 25.2 (H) 11/22/2020   HGB 11.4 (L) 11/22/2020   HCT 34.6 (L) 11/22/2020   MCV 94.3 11/22/2020   PLT 388 11/22/2020   BMET    Component Value Date/Time   NA 134 (L) 11/22/2020 0604   K 3.6 11/22/2020 0604   CL 104 11/22/2020 0604   CO2 21 (L) 11/22/2020 0604   GLUCOSE 106 (H) 11/22/2020 0604   BUN 25 (H) 11/22/2020 0604   CREATININE 0.94 11/22/2020 0604   CALCIUM 8.1 (L) 11/22/2020 0604   GFRNONAA >60 11/22/2020 0604   GFRAA >60 08/20/2018 0731     Assessment/Plan: 3 Days Post-Op   Principal Problem:   Epidural intraspinal abscess Active Problems:   Abscess   Pressure injury of skin   Hypertension associated with diabetes (HCC)   Staph aureus infection   Penrose drain removed today, sutures intact. Sutures will need removal in 8-10  days whether inpatient or outpatient. Recommend follow up in 10 days in the clinic at [facility] with Dion Saucier PA-C if outpatient for wound check/suture removal.  Continue Antibiotics per infectious disease.  Weightbearing Status: WBAT, ROM as tolerated.  Advised to work on elbow, wrist, and digit range of motion. Change dry dressings as needed if saturated.  Ortho will sign off, call or message for any concerns.  Daniel Cline 11/23/2020, 12:11 PM  Dion Saucier PA-C  Physician Assistant with Dr. Rebekah [place]

## 2020-11-23 NOTE — Progress Notes (Signed)
Providing Compassionate, Quality Care - Together   Subjective: Patient reports pain in right forearm. Denies numbness, tingling, or weakness of upper extremities.  Objective: Vital signs in last 24 hours: Temp:  [97.6 F (36.4 C)-98.3 F (36.8 C)] 97.8 F (36.6 C) (03/28 1059) Pulse Rate:  [81-108] 87 (03/28 1000) Resp:  [14-31] 16 (03/28 1000) BP: (125-164)/(72-100) 128/72 (03/28 1000) SpO2:  [91 %-97 %] 96 % (03/28 1000) FiO2 (%):  [40 %] 40 % (03/28 0800)  Intake/Output from previous day: 03/27 0701 - 03/28 0700 In: 1203.3 [P.O.:680; I.V.:23.2; IV Piggyback:500.1] Out: 1450 [Urine:1450] Intake/Output this shift: Total I/O In: 503 [P.O.:390; I.V.:13; IV Piggyback:100] Out: 320 [Urine:320]  Alert and oriented PERRLA CN II-XII grossly intact Speech clear MAE, Strength and sensation intact Incision is covered with Honeycomb dressing and Steri Strips; Dressing with dried sanguinous drainage. Not actively bleeding  Lab Results: Recent Labs    11/21/20 0512 11/22/20 0356 11/22/20 0604  WBC 31.8*  --  25.2*  HGB 11.4* 11.2* 11.4*  HCT 34.8* 33.0* 34.6*  PLT 385  --  388   BMET Recent Labs    11/21/20 0512 11/22/20 0356 11/22/20 0604  NA 131* 136 134*  K 3.8 3.7 3.6  CL 102  --  104  CO2 21*  --  21*  GLUCOSE 198*  --  106*  BUN 21  --  25*  CREATININE 0.90  --  0.94  CALCIUM 7.9*  --  8.1*    Studies/Results: DG Abd 1 View  Result Date: 11/21/2020 CLINICAL DATA:  OG tube placement. EXAM: ABDOMEN - 1 VIEW COMPARISON:  Film earlier this day FINDINGS: OG tube tip is identified overlying the mid stomach. No other significant changes noted. IMPRESSION: OG tube with tip overlying the mid stomach. Electronically Signed   By: Harmon Pier M.D.   On: 11/21/2020 12:11   MR THORACIC SPINE W WO CONTRAST  Result Date: 11/21/2020 CLINICAL DATA:  Cervical epidural abscess. Patient is status post surgical evacuation and laminectomy EXAM: MRI THORACIC WITHOUT AND WITH  CONTRAST TECHNIQUE: Multiplanar and multiecho pulse sequences of the thoracic spine were obtained without and with intravenous contrast. CONTRAST:  19mL GADAVIST GADOBUTROL 1 MMOL/ML IV SOLN COMPARISON:  Cervical spine MRI 11/18/2020 FINDINGS: Alignment:  Physiologic. Vertebrae: No fracture, evidence of discitis, or bone lesion. Cord: Normal signal and morphology. No thoracic epidural fluid collection. Thin epidural enhancement of the visualized lower cervical spine extending inferiorly to the T1-T2 level (series 11, image 7) without evidence of residual epidural fluid collection. Paraspinal and other soft tissues: Postsurgical changes within the posterior paraspinal soft tissues of the lower cervical spine are partially imaged at the edge of the field of view. Lower cervical posterior decompression. Simple appearing fluid collection at the laminectomy bed within the lower cervical spine seen only on sagittal sequences at the edge of the field of view measuring approximately 2.8 x 2.1 cm (series 7, image 5). There is intramuscular edema within the paraspinal musculature of the lower cervical spine. Disc levels: Shallow disc protrusion at the T9-10 level without foraminal or canal stenosis. Remaining thoracic intervertebral discs are within normal limits. There is no foraminal or canal stenosis at any level within the thoracic spine. IMPRESSION: 1. No thoracic epidural fluid collection. 2. Thin epidural enhancement of the visualized lower cervical spine extending inferiorly to the T1-T2 level without evidence of residual epidural fluid collection. Findings are likely secondary to known infection and recent surgery. 3. Postsurgical changes within the posterior paraspinal  soft tissues of the lower cervical spine are partially imaged at the edge of the field of view. Simple appearing fluid collection at the laminectomy bed within the lower cervical spine seen only on sagittal sequences at the edge of the field of view  measuring approximately 2.8 x 2.1 cm. 4. Intramuscular edema within the paraspinal musculature of the lower cervical spine, which may reflect postsurgical change and/or myositis. Electronically Signed   By: Duanne Guess D.O.   On: 11/21/2020 16:31   [place] EKG SITE RITE  Result Date: 11/23/2020 If Site Rite image not attached, placement could not be confirmed due to current cardiac rhythm.   Assessment/Plan: [name] presented to the [facility] office on 11/18/2020 appearing acutely ill, with severe neck pain.  A stat cervical MRI was completed, showing a cervical epidural abscess.  He underwent emergent partial C2 and complete C3, C4, C5, C6, C7 laminectomy with evacuation of epidural abscess on 11/18/2020 by Dr. Jordan Likes. Wound cultures growing gram positive cocci (MSSA). No growth in blood cultures. 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  He transitioned off the insulin drip on 11/17/2020 and is now on SSI. He became hypertensive on the evening of 11/17/2020, not responding to p.r.n. antihypertensives.  He was started on a Cardene drip. He has since transitioned to PO antihypertensives. He developed acute edema and erythema in his right upper extremity on 11/19/2020.  A venous ultrasound was performed and DVT was ruled out. CT scan revealed an intramuscular abscess. Dr. Aundria Rud of orthopedics performed an incision and drainage of the patient's right forearm on 11/20/2020. Patient was left intubated following surgery and extubated on 11/22/2020. Thoracic MRI on 11/21/2020 did not show any epidural abcess. Switched to nafcillin on 11/22/2020. Will require 4-6 weeks of abx.    LOS: 5 days    -Transfer out of ICU today -Abx per ID -Medical management per CCM-transitioning to [facility]   Val Eagle, DNP, AGNP-C Nurse Practitioner  [facility] [address], [address], [place], [place] [address] P: [phone]    F: [phone]  11/23/2020,  11:04 AM

## 2020-11-23 NOTE — Progress Notes (Signed)
NAME:  Daniel Cline, MRN:  [id number], DOB:  1955-11-03, LOS: 5 ADMISSION DATE:  11/18/2020, CONSULTATION DATE:  3/23 REFERRING MD:  Annette Stable, CHIEF COMPLAINT:  Neck pain   History of Present Illness:  65 y/o male admitted on 3/23 for emergent laminectomy/abscess evacuation in the setting of a cervical spine epidural abscess.  He had a R forearm abscess drained on 3/25 by ortho.  Extubated on 3/27.  Pertinent  Medical History  DM2 Hypertension  Significant Hospital Events: Including procedures, antibiotic start and stop dates in addition to other pertinent events    11/18/20 Laminectomy/abscess evacuation  3/23 sars cov 2 > neg  3/23 blood culture > neg  3/23 cefazolin > 3/27  3/23 cefepime > 3/24  3/23 vanc > 3/27  3/23 cervical epidural abscess> MSSA  11/20/2020 I& D of right shoulder by orthopedics  ETT 3/25 >> 3/27 . 3/25 R forearm soft tissue abscess > MSSA . 3/27 Nafcillin >   Studies MRI 11/18/20: paraspinal loculated fluid collections c1-c7 communicating with extensive epidural collection from c2 to upper t-spine, mass effect on cord c5-c7  R Forearm CT 11/19/20: circumferential subcutaneous edema, decreased attenuation within flexor compartment musculature proximal to the mid forearm  MRI thoracic spine 3/26: No thoracic epidural fluid collection. Thin epidural enhancement of the visualized lower cervical spine extending inferiorly to the T1-T2 level without evidence of residual epidural fluid collection. Findings are likely secondary to known infection and recent surgery. 3. Postsurgical changes within the posterior paraspinal soft tissues of the lower cervical spine are partially imaged at the edge of the field of view. Simple appearing fluid collection at the laminectomy bed within the lower cervical spine seen only on sagittal sequences at the edge of the field of view measuring approximately 2.8 x 2.1 cm. 4. Intramuscular edema within the paraspinal  musculature of the lower cervical spine, which may reflect postsurgical change and/or myositis.  Interim History / Subjective:  Extubated yesterday Stiff, weak, constipated  Objective   Blood pressure 133/81, pulse 81, temperature 98.3 F (36.8 C), temperature source Oral, resp. rate 14, height 5' 6" (1.676 m), weight 83.9 kg, SpO2 95 %.        Intake/Output Summary (Last 24 hours) at 11/23/2020 2202 Last data filed at 11/23/2020 0800 Gross per 24 hour  Intake 1081.07 ml  Output 1560 ml  Net -478.93 ml   Filed Weights   11/18/20 1728  Weight: 83.9 kg    Examination:  General:  Resting comfortably in bed HENT: NCAT OP clear PULM: CTA B, normal effort CV: RRR, no mgr GI: BS+, soft, nontender MSK: normal bulk and tone Derm: swelling noted, right arm dressing in place Neuro: awake, alert, no distress, MAEW   Labs/imaging that I havepersonally reviewed  (right click and "Reselect all SmartList Selections" daily)  K 3.6, Cr OK, Na OK Alk phos 203, WBC 25  Resolved Hospital Problem list     Assessment & Plan:  Acute hypoxemic respiratory failure in the setting of an cervical spine epidural abscess drainage> resolved Out of bed Aspiration precautions, advance to dysphagia diet Wean off O2 Incentive spirometry  Physical deconditioning PT consult  MSSA epidural abscess Forearm MSSA abscess PICC consult  Needs 4-6 weeks of nafcillin  Hypertension Change back to home metoprolol  DM2 with hyperglycemia Adjust long acting and SSI today as he is taking less by mouth  Hyponatremia/hypokalemia Replace today by mouht  Anasarca Lasix x1 with K replacement  Elevated alk phos, unclear etiology  Repeat LFT in AM Send GGT    Best practice (right click and "Reselect all SmartList Selections" daily)  Diet:  Oral Pain/Anxiety/Delirium protocol (if indicated): No VAP protocol (if indicated): Not indicated DVT prophylaxis: SCD > plan is to hold heparin until 3/30 GI  prophylaxis: N/A Glucose control:  SSI Yes Central venous access:  Yes, and it is still needed   Arterial line:  N/A Foley:  N/A Mobility:  bed rest  PT consulted: Yes Last date of multidisciplinary goals of care discussion [per NSGY] Code Status:  full code Disposition: to floor, will ask TRH to pick up tomorrow  Labs   CBC: Recent Labs  Lab 11/18/20 2209 11/19/20 0613 11/20/20 0610 11/20/20 2046 11/21/20 0512 11/22/20 0356 11/22/20 0604  WBC 39.4* 39.6* 32.3*  --  31.8*  --  25.2*  NEUTROABS 34.6*  --   --   --   --   --   --   HGB 14.8 12.9* 12.4* 11.2* 11.4* 11.2* 11.4*  HCT 45.2 38.0* 36.9* 33.0* 34.8* 33.0* 34.6*  MCV 93.4 92.5 92.9  --  94.3  --  94.3  PLT 414* 377 407*  --  385  --  657    Basic Metabolic Panel: Recent Labs  Lab 11/19/20 0046 11/19/20 0613 11/20/20 0610 11/20/20 2046 11/21/20 0512 11/22/20 0356 11/22/20 0604  NA 136 137 132* 133* 131* 136 134*  K 3.2* 3.3* 3.2* 3.4* 3.8 3.7 3.6  CL 102 102 101  --  102  --  104  CO2 26 24 20*  --  21*  --  21*  GLUCOSE 123* 167* 230*  --  198*  --  106*  BUN _0 --  21  --  25*  CREATININE 0.69 0.74 0.71  --  0.90  --  0.94  CALCIUM 8.1* 8.2* 8.1*  --  7.9*  --  8.1*  MG  --   --   --   --   --   --  2.0  PHOS  --   --   --   --   --   --  4.0   GFR: Estimated Creatinine Clearance: 79.6 mL/min (by C-G formula based on SCr of 0.94 mg/dL). Recent Labs  Lab 11/19/20 0613 11/20/20 0610 11/21/20 0512 11/22/20 0604  WBC 39.6* 32.3* 31.8* 25.2*    Liver Function Tests: Recent Labs  Lab 11/21/20 0512 11/22/20 0604  AST 26 68*  ALT 22 40  ALKPHOS 191* 203*  BILITOT 0.7 0.6  PROT 5.4* 5.8*  ALBUMIN 1.2* 1.3*   No results for input(s): LIPASE, AMYLASE in the last 168 hours. No results for input(s): AMMONIA in the last 168 hours.  ABG    Component Value Date/Time   PHART 7.505 (H) 11/22/2020 0356   PCO2ART 24.2 (L) 11/22/2020 0356   PO2ART 77 (L) 11/22/2020 0356   HCO3 19.0 (L)  11/22/2020 0356   TCO2 20 (L) 11/22/2020 0356   ACIDBASEDEF 3.0 (H) 11/22/2020 0356   O2SAT 96.0 11/22/2020 0356     Coagulation Profile: No results for input(s): INR, PROTIME in the last 168 hours.  Cardiac Enzymes: No results for input(s): CKTOTAL, CKMB, CKMBINDEX, TROPONINI in the last 168 hours.  HbA1C: Hgb A1c MFr Bld  Date/Time Value Ref Range Status  11/19/2020 06:13 AM 13.9 (H) 4.8 - 5.6 % Final    Comment:    (NOTE) Pre diabetes:          5.7%-6.4%  Diabetes:              >  6.4%  Glycemic control for   <7.0% adults with diabetes     CBG: Recent Labs  Lab 11/22/20 0738 11/22/20 1215 11/22/20 1537 11/22/20 2102 11/23/20 0741  GLUCAP 93 94 71 129* 78       Critical care time: n/a    Roselie Awkward, MD [facility] Pager: [phone] Cell: [phone] If no response, please call [phone] until 7pm After 7:00 pm call Elink  [phone]
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
## 2020-11-23 NOTE — Evaluation (Signed)
Clinical/Bedside Swallow Evaluation Patient Details  Name: Daniel Cline MRN: [id number] Date of Birth: 02-19-1956  Today's Date: 11/23/2020 Time: SLP Start Time (ACUTE ONLY): 0910 SLP Stop Time (ACUTE ONLY): 0920 SLP Time Calculation (min) (ACUTE ONLY): 10 min  Past Medical History:  Past Medical History:  Diagnosis Date  . Diabetes mellitus without complication [facility] [place])    Past Surgical History:  Past Surgical History:  Procedure Laterality Date  . POSTERIOR CERVICAL LAMINECTOMY N/A 11/18/2020   Procedure: CERVICAL THREE-FOUR, CERVICAL FOUR-FIVE, CERVICAL FIVE-SIX, CERVICAL SIX-SEVEN CERVICAL LAMINECTOMY FOR EVACUATION OF ABSCESS;  Surgeon: Julio Sicks, MD;  Location: [facility] OR;  Service: Neurosurgery;  Laterality: N/A;   HPI:  64yM with DM who presented with severe neck pain with weakness in LUE.  He had a fall a week PTA and since then ahd increasing neck pain and weakness in LUE which has gradually worsened. 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 He underwent laminectomy/abscess evacuation (posterior approach) on 3/23, then I& D of right shoulder by orthopedics. 3/25 ETT 3/25 >> 3/27   Assessment / Plan / Recommendation Clinical Impression  Pt demonstrates no signs of dysphagia. He has been reported to cough at least once when taking thin liquids, but pt is not able to feed himself and per his reports was reclined while drinking. SLP sat pt upright and offered consecutive straw sips of water without any overt impairment. Pt also able to masticate soft solids desptie missing dentition. Recommend upgrade to mechanical soft with thin liquids if ok medically for solids. Pt will need assist with feeding and given inability to cut foods and also missing dentition, softer chopped diet is appropriate. No SLP f/u needed will sign off. SLP Visit Diagnosis: Dysphagia, oropharyngeal phase (R13.12)     Aspiration Risk  Mild aspiration risk    Diet Recommendation Dysphagia 3 (Mech soft);Thin liquid   Liquid Administration via: Cup;Straw Medication Administration: Whole meds with liquid Supervision: Staff to assist with self feeding Compensations: Slow rate;Small sips/bites Postural Changes: Seated upright at 90 degrees    Other  Recommendations Oral Care Recommendations: Oral care BID   Follow up Recommendations None      Frequency and Duration            Prognosis        Swallow Study   General HPI: 64yM with DM who presented with severe neck pain with weakness in LUE.  He had a fall a week PTA and since then ahd increasing neck pain and weakness in LUE which has gradually worsened. 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 He underwent laminectomy/abscess evacuation (posterior approach) on 3/23, then I& D of right shoulder by orthopedics. 3/25 ETT 3/25 >> 3/27 Type of Study: Bedside Swallow Evaluation Diet Prior to this Study: Thin liquids Temperature Spikes Noted: No Respiratory Status: Nasal cannula History of Recent Intubation: Yes Length of Intubations (days): 3 days Date extubated: 11/22/20 Behavior/Cognition: Alert;Cooperative;Pleasant mood Oral Cavity Assessment: Within Functional Limits Oral Care Completed by SLP: No Oral Cavity - Dentition: Missing dentition Vision: Functional for self-feeding Self-Feeding Abilities: Total assist Patient Positioning: Upright in bed Baseline Vocal Quality: Normal Volitional Cough: Strong    Oral/Motor/Sensory Function Overall Oral Motor/Sensory Function: Within functional limits   Ice Chips     Thin Liquid Thin Liquid: Within functional limits Presentation: Straw    Nectar Thick Nectar Thick Liquid: Not tested  Honey Thick Honey Thick Liquid: Not tested   Puree Puree: Within functional limits   Solid     Solid: Within  functional limits     Harlon Ditty, MA CCC-SLP  Acute Rehabilitation Services Pager [phone] Office [phone]  Claudine Mouton 11/23/2020,9:30 AM

## 2020-11-23 NOTE — Progress Notes (Signed)
Peripherally Inserted Central Catheter Placement  The IV Nurse has discussed with the patient and/or persons authorized to consent for the patient, the purpose of this procedure and the potential benefits and risks involved with this procedure.  The benefits include less needle sticks, lab draws from the catheter, and the patient may be discharged home with the catheter. Risks include, but not limited to, infection, bleeding, blood clot (thrombus formation), and puncture of an artery; nerve damage and irregular heartbeat and possibility to perform a PICC exchange if needed/ordered by physician.  Alternatives to this procedure were also discussed.  Bard Power PICC patient education guide, fact sheet on infection prevention and patient information card has been provided to patient /or left at bedside.    PICC Placement Documentation  PICC Single Lumen 11/23/20 PICC Left Basilic 45 cm 0 cm (Active)  Indication for Insertion or Continuance of Line Prolonged intravenous therapies 11/23/20 1254  Exposed Catheter (cm) 0 cm 11/23/20 1254  Site Assessment Clean;Dry;Intact 11/23/20 1254  Line Status Flushed;Blood return noted 11/23/20 1254  Dressing Type Transparent 11/23/20 1254  Dressing Status Clean;Dry;Intact 11/23/20 1254  Antimicrobial disc in place? Yes 11/23/20 1254  Dressing Intervention New dressing;Other (Comment) 11/23/20 1254  Dressing Change Due 11/30/20 11/23/20 1254    Verbal consent   Reginia Forts Albarece 11/23/2020, 12:56 PM

## 2020-11-23 NOTE — Progress Notes (Signed)
RN to DC central line

## 2020-11-24 ENCOUNTER — Inpatient Hospital Stay (HOSPITAL_COMMUNITY): Payer: Medicare Other

## 2020-11-24 ENCOUNTER — Encounter (HOSPITAL_COMMUNITY): Payer: Self-pay | Admitting: Orthopedic Surgery

## 2020-11-24 DIAGNOSIS — I159 Secondary hypertension, unspecified: Secondary | ICD-10-CM

## 2020-11-24 DIAGNOSIS — I10 Essential (primary) hypertension: Secondary | ICD-10-CM

## 2020-11-24 DIAGNOSIS — I160 Hypertensive urgency: Secondary | ICD-10-CM

## 2020-11-24 LAB — LIPID PANEL
Cholesterol: 102 mg/dL (ref 0–200)
HDL: 15 mg/dL — ABNORMAL LOW (ref >40)
LDL Cholesterol: 57 mg/dL (ref 0–99)
Total CHOL/HDL Ratio: 6.8 RATIO
Triglycerides: 152 mg/dL — ABNORMAL HIGH (ref <150)
VLDL: 30 mg/dL (ref 0–40)

## 2020-11-24 LAB — HEPATIC FUNCTION PANEL
ALT: 95 U/L — ABNORMAL HIGH (ref 0–44)
AST: 74 U/L — ABNORMAL HIGH (ref 15–41)
Albumin: 1.2 g/dL — ABNORMAL LOW (ref 3.5–5.0)
Alkaline Phosphatase: 232 U/L — ABNORMAL HIGH (ref 38–126)
Bilirubin, Direct: 0.7 mg/dL — ABNORMAL HIGH (ref 0.0–0.2)
Indirect Bilirubin: 1.2 mg/dL — ABNORMAL HIGH (ref 0.3–0.9)
Total Bilirubin: 1.9 mg/dL — ABNORMAL HIGH (ref 0.3–1.2)
Total Protein: 5.5 g/dL — ABNORMAL LOW (ref 6.5–8.1)

## 2020-11-24 LAB — CBC
HCT: 36.3 % — ABNORMAL LOW (ref 39.0–52.0)
Hemoglobin: 12.1 g/dL — ABNORMAL LOW (ref 13.0–17.0)
MCH: 31 pg (ref 26.0–34.0)
MCHC: 33.3 g/dL (ref 30.0–36.0)
MCV: 93.1 fL (ref 80.0–100.0)
Platelets: 465 10*3/uL — ABNORMAL HIGH (ref 150–400)
RBC: 3.9 MIL/uL — ABNORMAL LOW (ref 4.22–5.81)
RDW: 12.8 % (ref 11.5–15.5)
WBC: 22.7 10*3/uL — ABNORMAL HIGH (ref 4.0–10.5)
nRBC: 0 % (ref 0.0–0.2)

## 2020-11-24 LAB — BASIC METABOLIC PANEL
Anion gap: 9 (ref 5–15)
BUN: 21 mg/dL (ref 8–23)
CO2: 23 mmol/L (ref 22–32)
Calcium: 7.8 mg/dL — ABNORMAL LOW (ref 8.9–10.3)
Chloride: 105 mmol/L (ref 98–111)
Creatinine, Ser: 0.91 mg/dL (ref 0.61–1.24)
GFR, Estimated: >60 mL/min (ref >60)
Glucose, Bld: 178 mg/dL — ABNORMAL HIGH (ref 70–99)
Potassium: 3.5 mmol/L (ref 3.5–5.1)
Sodium: 137 mmol/L (ref 135–145)

## 2020-11-24 LAB — GLUCOSE, CAPILLARY
Glucose-Capillary: 130 mg/dL — ABNORMAL HIGH (ref 70–99)
Glucose-Capillary: 167 mg/dL — ABNORMAL HIGH (ref 70–99)
Glucose-Capillary: 126 mg/dL — ABNORMAL HIGH (ref 70–99)

## 2020-11-24 LAB — IRON AND TIBC
Iron: 29 ug/dL — ABNORMAL LOW (ref 45–182)
Saturation Ratios: 19 % (ref 17.9–39.5)
TIBC: 150 ug/dL — ABNORMAL LOW (ref 250–450)
UIBC: 121 ug/dL

## 2020-11-24 LAB — GAMMA GT: GGT: 178 U/L — ABNORMAL HIGH (ref 7–50)

## 2020-11-24 LAB — FERRITIN: Ferritin: 1152 ng/mL — ABNORMAL HIGH (ref 24–336)

## 2020-11-24 LAB — FOLATE: Folate: 15.3 ng/mL (ref >5.9)

## 2020-11-24 LAB — TSH: TSH: 0.957 u[IU]/mL (ref 0.350–4.500)

## 2020-11-24 LAB — VITAMIN B12: Vitamin B-12: 1162 pg/mL — ABNORMAL HIGH (ref 180–914)

## 2020-11-24 IMAGING — US US ABDOMEN LIMITED RUQ/ASCITES
1 series · 14 of 25 positions shown · non-contrast
Comparison: None.

CLINICAL DATA: Abnormal liver function tests.

EXAM:
ULTRASOUND ABDOMEN LIMITED RIGHT UPPER QUADRANT

[Series 1: us abdomen limited ruq (liver/gb) · 14 of 39 slices shown]
[im 1/39]
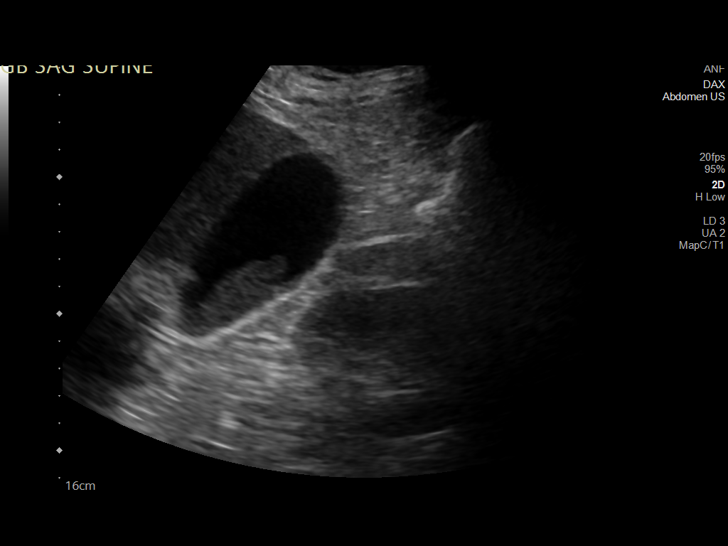
[im 4/39]
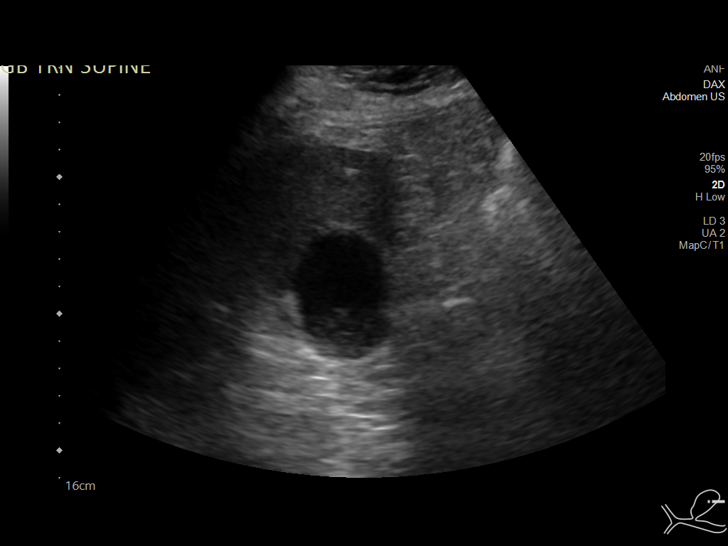
[im 7/39]
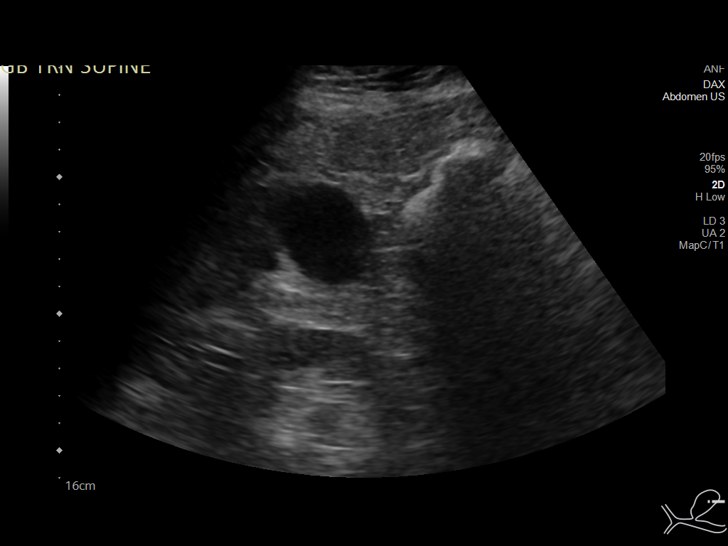
[im 10/39]
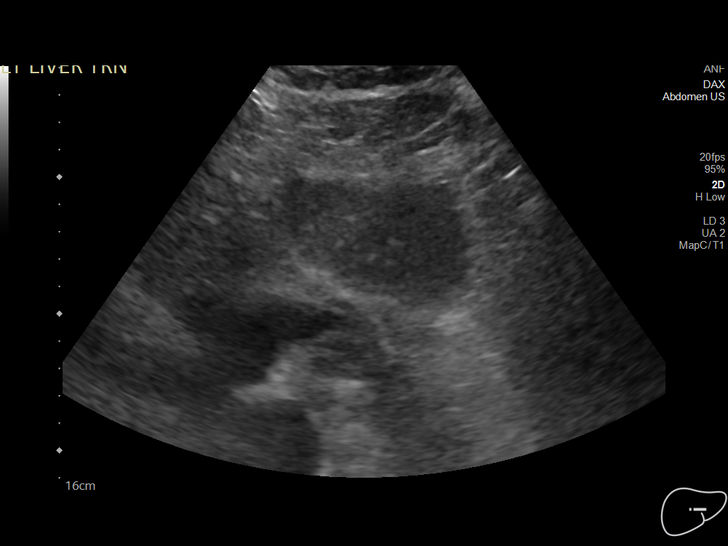
[im 13/39]
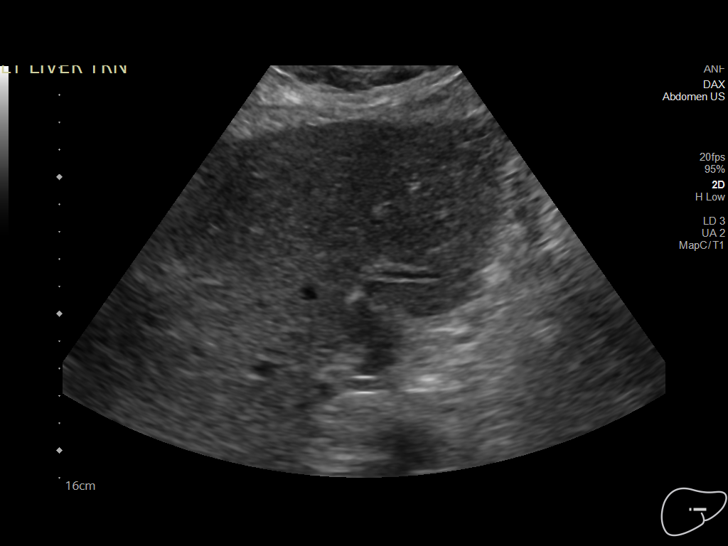
[im 15/39]
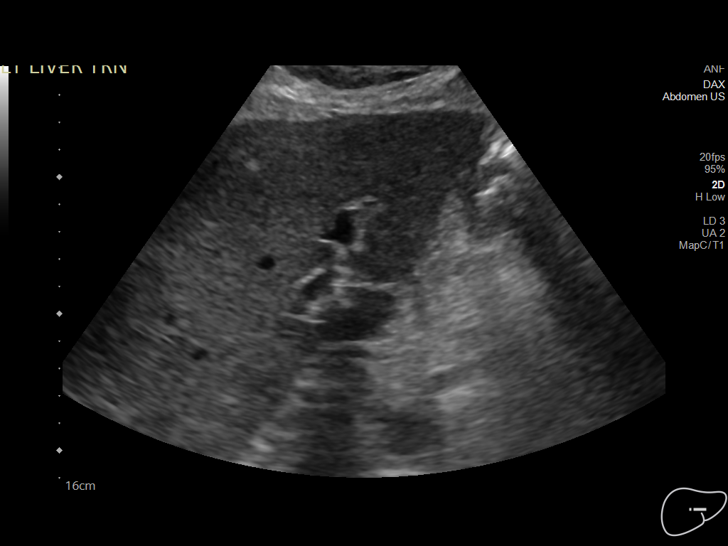
[im 18/39]
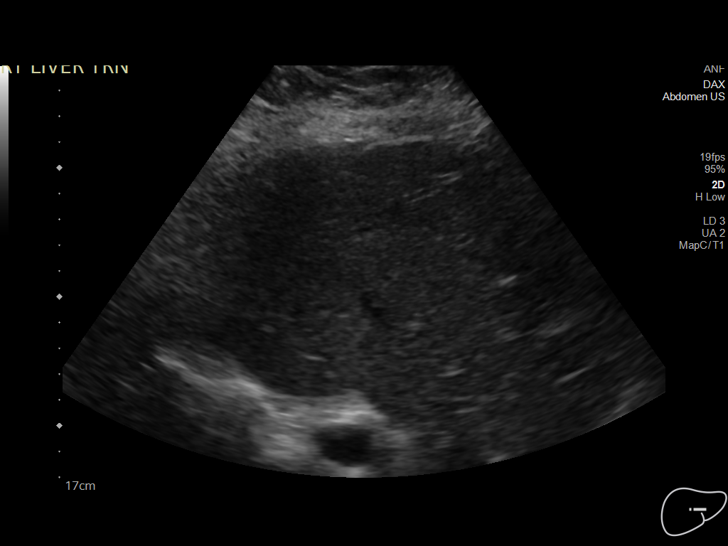
[im 21/39]
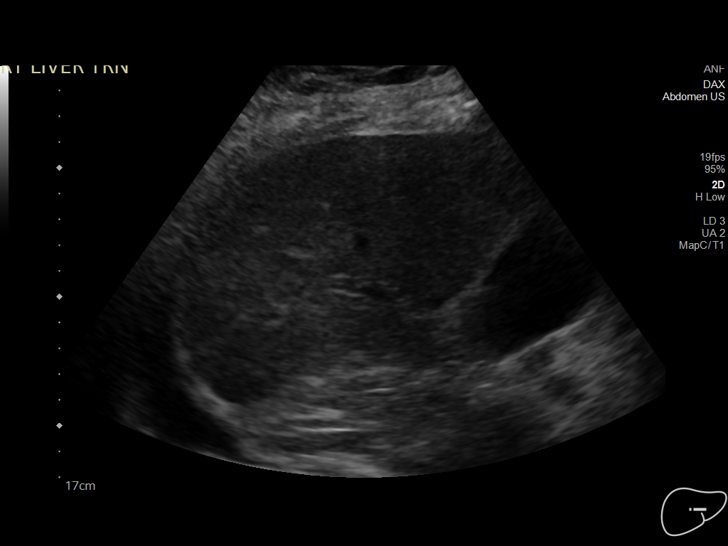
[im 24/39]
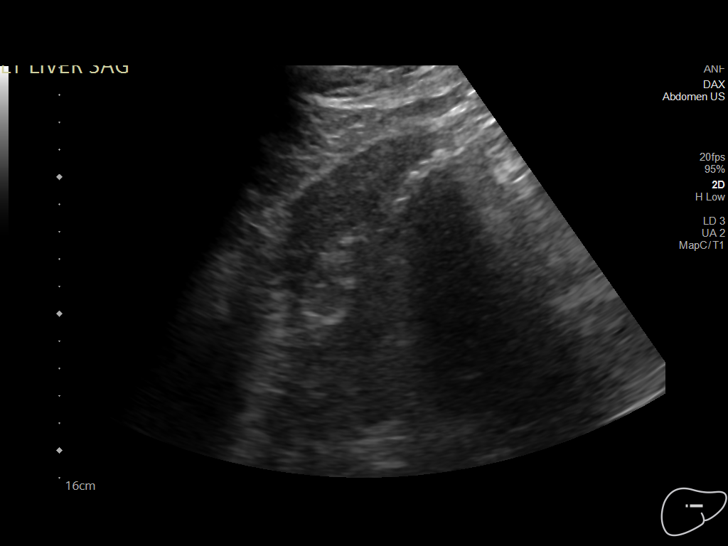
[im 26/39]
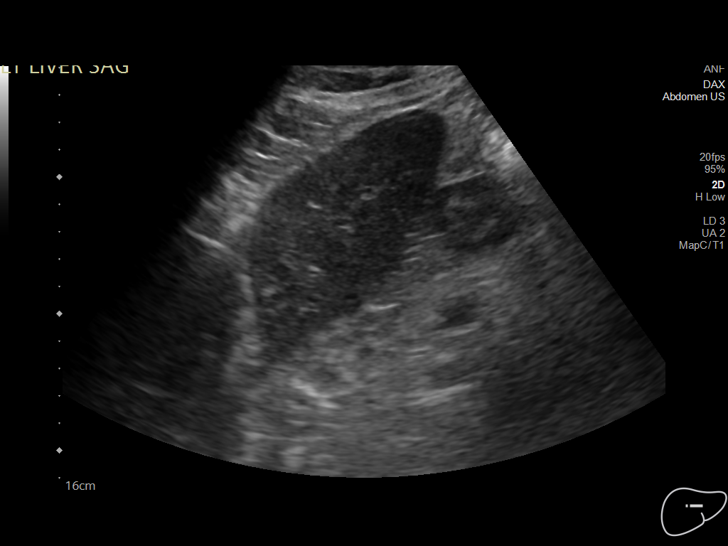
[im 29/39]
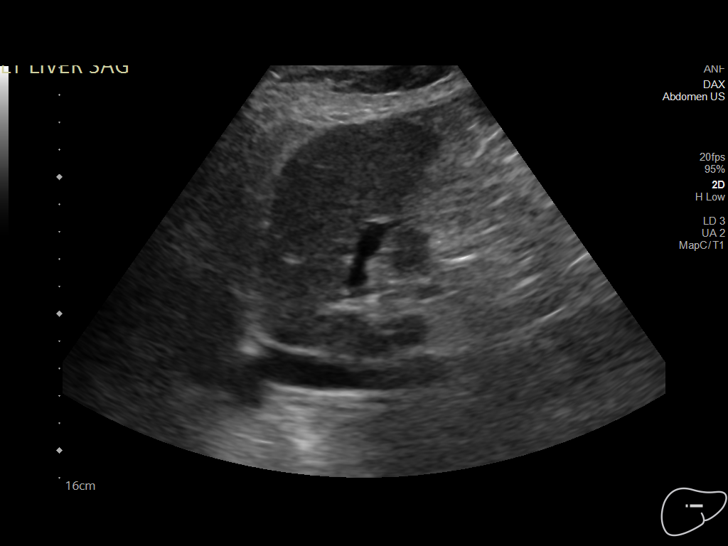
[im 32/39]
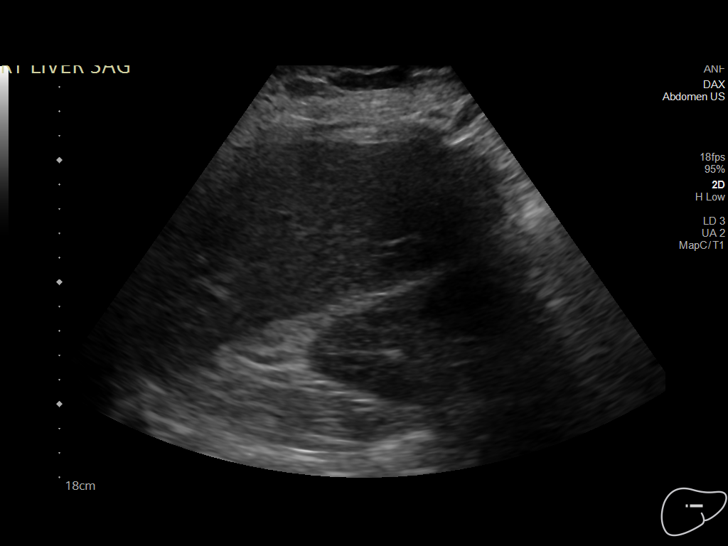
[im 35/39]
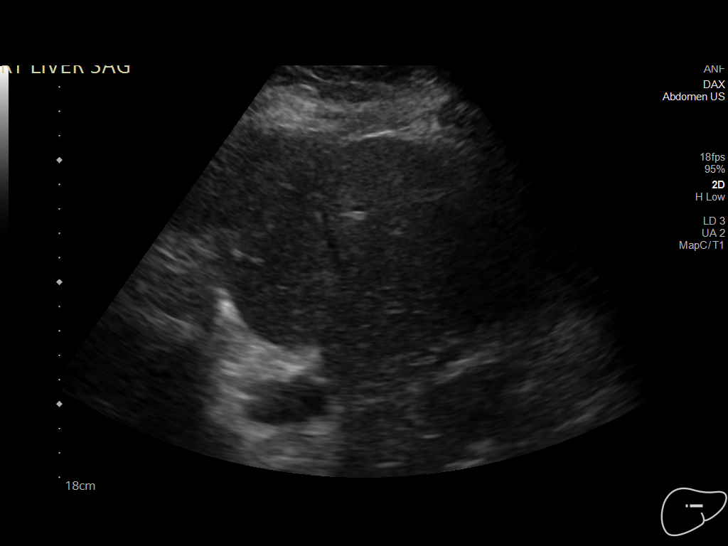
[im 39/39]
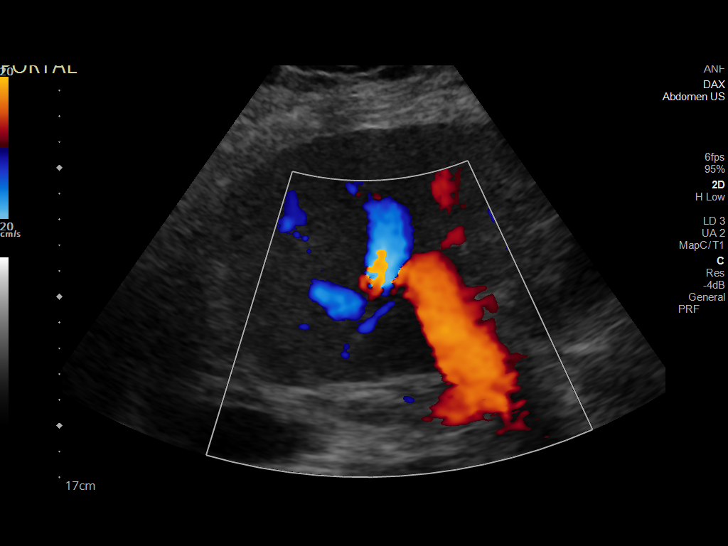

[14 of 25 positions shown; findings below may reference images not displayed]

FINDINGS: Gallbladder:

No gallstones or wall thickening visualized. No sonographic Murphy
sign noted by sonographer. Sludge is noted within gallbladder lumen.

Common bile duct:

Diameter: 3 mm which is within normal limits.

Liver:

No focal lesion identified. Within normal limits in parenchymal
echogenicity. Portal vein is patent on color Doppler imaging with
normal direction of blood flow towards the liver.

Other: None.
IMPRESSION: Gallbladder sludge. No other abnormality seen in the right upper
quadrant of the abdomen.

## 2020-11-24 MED ORDER — INSULIN STARTER KIT- PEN NEEDLES (ENGLISH)
1.0000 | Freq: Once | Status: AC
  Administered 2020-11-25: 1
  Filled 2020-11-24: qty 1

## 2020-11-24 MED ORDER — CEFAZOLIN SODIUM-DEXTROSE 2-4 GM/100ML-% IV SOLN
2.0000 g | Freq: Three times a day (TID) | INTRAVENOUS | Status: DC
  Administered 2020-11-24 – 2020-11-28 (×12): 2 g via INTRAVENOUS
  Filled 2020-11-24 (×15): qty 100

## 2020-11-24 MED ORDER — SODIUM CHLORIDE 0.9 % IV SOLN
12.0000 g | INTRAVENOUS | Status: DC
  Filled 2020-11-24: qty 12000

## 2020-11-24 MED ORDER — LABETALOL HCL 5 MG/ML IV SOLN
10.0000 mg | INTRAVENOUS | Status: DC | PRN
  Administered 2020-11-24: 10 mg via INTRAVENOUS
  Filled 2020-11-24: qty 4

## 2020-11-24 MED ORDER — LISINOPRIL 20 MG PO TABS
20.0000 mg | ORAL_TABLET | Freq: Every day | ORAL | Status: DC
  Administered 2020-11-24: 20 mg via ORAL
  Filled 2020-11-24: qty 1

## 2020-11-24 MED ORDER — HYDRALAZINE HCL 25 MG PO TABS
25.0000 mg | ORAL_TABLET | ORAL | Status: DC | PRN
  Administered 2020-11-24 – 2020-11-27 (×3): 25 mg via ORAL
  Filled 2020-11-24 (×3): qty 1

## 2020-11-24 MED ORDER — LABETALOL HCL 5 MG/ML IV SOLN
10.0000 mg | INTRAVENOUS | Status: DC | PRN
  Administered 2020-11-25 – 2020-11-27 (×6): 10 mg via INTRAVENOUS
  Filled 2020-11-24 (×8): qty 4

## 2020-11-24 MED ORDER — LIVING WELL WITH DIABETES BOOK
Freq: Once | Status: AC
  Filled 2020-11-24: qty 1
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
## 2020-11-24 NOTE — Progress Notes (Signed)
Inpatient Diabetes Program Recommendations  AACE/ADA: New Consensus Statement on Inpatient Glycemic Control (2015)  Target Ranges:  Prepandial:   less than 140 mg/dL      Peak postprandial:   less than 180 mg/dL (1-2 hours)      Critically ill patients:  140 - 180 mg/dL   Lab Results  Component Value Date   GLUCAP 248 (H) 11/23/2020   HGBA1C 13.9 (H) 11/19/2020    Review of Glycemic Control Results for ROGER, KETTLES (MRN [id number]) as of 11/24/2020 10:31  Ref. Range 11/22/2020 21:02 11/23/2020 07:41 11/23/2020 10:58 11/23/2020 15:01 11/23/2020 19:23  Glucose-Capillary Latest Ref Range: 70 - 99 mg/dL 409 (H) 78 811 (H) 914 (H) 248 (H)   Diabetes history: DM 2 Outpatient Diabetes medications:  Glucophage 1000 mg bid Current orders for Inpatient glycemic control:  Novolog moderate tid with meals and HS Levemir 10 units q HS Inpatient Diabetes Program Recommendations:    Agree with current orders.  A1C indicates that patient will need insulin at d/c.  Currently patient is NPO for Ultrasound.  Will follow.  Needs insulin teaching and basic survival skill education regarding DM.   Thanks  Beryl Meager, RN, BC-ADM Inpatient Diabetes Coordinator Pager [phone] (8a-5p)

## 2020-11-24 NOTE — Progress Notes (Signed)
Attempted to speak to patient regarding DM and need for insulin.  Patient states that he is ready to go to the other part of the building where it is cooler and that he is not planning to stay tonight.  Attempted to re-orient patient. Patient pleasant but not appropriate for education at this time.  Discussed with RN as well.    Thanks  Beryl Meager, RN, BC-ADM Inpatient Diabetes Coordinator Pager [phone] (8a-5p)

## 2020-11-24 NOTE — Progress Notes (Signed)
[facility] for Infectious Disease  Date of Admission:  11/18/2020      Total days of antibiotics 7  Nafcillin 3/27 >> current  Cefazolin 3/25 >> 3/27  Cefepime 3/23 >> 3/24    ASSESSMENT: Daniel Cline is a 65 y.o. male with C3-C7 epidural abscess now evacuation with Dr. Annette Stable. MRI of C-spine reveal epidural abscess. T-spine reveals no residual fluid collections in epidural space. Diffuse generalized edema - will switch back to Cefazolin for treatment to help with salt/fluid burden.   He was not bacteremic at presentation - TTE reveals reduced EF 35-40%. 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 I am not sure he has a cardiologist outpatient.   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 Growing MSSA as well. Drain has been removed. General swelling.   No other signs of metastatic infection at this time. PICC in place. Planning CIR discharge for him. Plan to switch back to cefazolin 2 gm TID IV to complete 6 weeks. Check baseline ESR/CRP for therapeutic monitoring.   Health maintenance = hep c Ab and hiv are non-reactive    PLAN: 1. PICC in place 2. Change to cefazolin   3. ESR / CRP in AM  4. ?Cardiology follow up with EF 35% and AI.     Principal Problem:   Epidural intraspinal abscess Active Problems:   Abscess   Pressure injury of skin   Hypertension associated with diabetes ([place])   Staph aureus infection   . amLODipine  10 mg Oral Daily  . Chlorhexidine Gluconate Cloth  6 each Topical Daily  . insulin aspart  0-15 Units Subcutaneous TID WC  . insulin aspart  0-5 Units Subcutaneous QHS  . insulin detemir  10 Units Subcutaneous QHS  . lisinopril  20 mg Oral Daily  . liver oil-zinc oxide   Topical BID  . mouth rinse  15 mL Mouth Rinse BID  . metoprolol tartrate  50 mg Oral BID  . senna-docusate  1 tablet Oral BID  . sodium chloride flush  10-40 mL Intracatheter Q12H  . sodium chloride flush   10-40 mL Intracatheter Q12H  . sodium chloride flush  3 mL Intravenous Q12H    SUBJECTIVE: Thirsty, feels bad today. "just blah"    Review of Systems: Review of Systems  Constitutional: Negative for chills and fever.  Respiratory: Negative for cough.   Cardiovascular: Negative for chest pain.  Gastrointestinal: Positive for nausea.  Genitourinary: Negative for dysuria.  Musculoskeletal: Positive for back pain and neck pain. Negative for joint pain.  Skin: Negative for itching and rash.  Neurological: Positive for weakness. Negative for focal weakness.    No Known Allergies   OBJECTIVE: Vitals:   11/24/20 0806 11/24/20 1124 11/24/20 1215 11/24/20 1215  BP: (!) 161/82 (!) 163/86 (!) 174/85 (!) 174/85  Pulse: 91 (!) 101 94 94  Resp: '18  18 18  ' Temp: 98.1 F (36.7 C)  97.9 F (36.6 C) 97.9 F (36.6 C)  TempSrc: Oral  Oral Oral  SpO2: 98%   99%  Weight:      Height:       Body mass index is 34.73 kg/m.   Physical Exam Vitals and nursing note reviewed.  Constitutional:      Comments: Resting in bed. No distress. Appears uncomfortable.   HENT:     Mouth/Throat:     Mouth: No oral lesions.  Dentition: Normal dentition. No dental caries.  Eyes:     General: No scleral icterus. Cardiovascular:     Rate and Rhythm: Normal rate and regular rhythm.     Heart sounds: Normal heart sounds.  Pulmonary:     Effort: Pulmonary effort is normal.     Breath sounds: Normal breath sounds.  Abdominal:     General: There is no distension.     Palpations: Abdomen is soft.     Tenderness: There is no abdominal tenderness.  Musculoskeletal:     Comments: Able to raise both arms off the bed to gravity. Moves hands/fingers. Swelling noted to both upper extremeties.  PICC in LUE clean.   Lymphadenopathy:     Cervical: No cervical adenopathy.  Skin:    General: Skin is warm and dry.     Capillary Refill: Capillary refill takes less than 2 seconds.     Findings: No rash.   Neurological:     Mental Status: He is alert and oriented to person, place, and time.     Lab Results Lab Results  Component Value Date   WBC 22.7 (H) 11/24/2020   HGB 12.1 (L) 11/24/2020   HCT 36.3 (L) 11/24/2020   MCV 93.1 11/24/2020   PLT 465 (H) 11/24/2020    Lab Results  Component Value Date   CREATININE 0.91 11/24/2020   BUN 21 11/24/2020   NA 137 11/24/2020   K 3.5 11/24/2020   CL 105 11/24/2020   CO2 23 11/24/2020    Lab Results  Component Value Date   ALT 95 (H) 11/24/2020   AST 74 (H) 11/24/2020   GGT 178 (H) 11/24/2020   ALKPHOS 232 (H) 11/24/2020   BILITOT 1.9 (H) 11/24/2020     Microbiology: Recent Results (from the past 240 hour(s))  SARS Coronavirus 2 by RT PCR (hospital order, performed in [place] hospital lab) Nasopharyngeal Nasopharyngeal Swab     Status: None   Collection Time: 11/18/20  4:53 PM   Specimen: Nasopharyngeal Swab  Result Value Ref Range Status   SARS Coronavirus 2 NEGATIVE NEGATIVE Final    Comment: (NOTE) SARS-CoV-2 target nucleic acids are NOT DETECTED.  The SARS-CoV-2 RNA is generally detectable in upper and lower respiratory specimens during the acute phase of infection. The lowest concentration of SARS-CoV-2 viral copies this assay can detect is 250 copies / mL. A negative result does not preclude SARS-CoV-2 infection and should not be used as the sole basis for treatment or other patient management decisions.  A negative result may occur with improper specimen collection / handling, submission of specimen other than nasopharyngeal swab, presence of viral mutation(s) within the areas targeted by this assay, and inadequate number of viral copies (<250 copies / mL). A negative result must be combined with clinical observations, patient history, and epidemiological information.  Fact Sheet for Patients:   StrictlyIdeas.no  Fact Sheet for Healthcare  Providers: BankingDealers.co.za  This test is not yet approved or  cleared by the [place] FDA and has been authorized for detection and/or diagnosis of SARS-CoV-2 by FDA under an Emergency Use Authorization (EUA).  This EUA will remain in effect (meaning this test can be used) for the duration of the COVID-19 declaration under Section 564(b)(1) of the Act, 21 U.S.C. section 360bbb-3(b)(1), unless the authorization is terminated or revoked sooner.  Performed at [facility], [place] [address], [place], [place] [address]   Aerobic/Anaerobic Culture w Gram Stain (surgical/deep wound)     Status:  None   Collection Time: 11/18/20  7:06 PM   Specimen: Abscess  Result Value Ref Range Status   Specimen Description ABSCESS  Final   Special Requests CERVICAL  Final   Gram Stain   Final    FEW WBC PRESENT, PREDOMINANTLY PMN ABUNDANT GRAM POSITIVE COCCI    Culture   Final    FEW STAPHYLOCOCCUS AUREUS NO ANAEROBES ISOLATED Performed at [facility], [address], [place], [place] [address]    Report Status 11/23/2020 FINAL  Final   Organism ID, Bacteria STAPHYLOCOCCUS AUREUS  Final      Susceptibility   Staphylococcus aureus - MIC*    CIPROFLOXACIN <=0.5 SENSITIVE Sensitive     ERYTHROMYCIN <=0.25 SENSITIVE Sensitive     GENTAMICIN <=0.5 SENSITIVE Sensitive     OXACILLIN <=0.25 SENSITIVE Sensitive     TETRACYCLINE <=1 SENSITIVE Sensitive     VANCOMYCIN <=0.5 SENSITIVE Sensitive     TRIMETH/SULFA <=10 SENSITIVE Sensitive     CLINDAMYCIN <=0.25 SENSITIVE Sensitive     RIFAMPIN <=0.5 SENSITIVE Sensitive     Inducible Clindamycin NEGATIVE Sensitive     * FEW STAPHYLOCOCCUS AUREUS  Culture, blood (routine x 2)     Status: None   Collection Time: 11/18/20  9:56 PM   Specimen: BLOOD  Result Value Ref Range Status   Specimen Description BLOOD SITE NOT SPECIFIED  Final   Special Requests   Final    BOTTLES DRAWN AEROBIC AND ANAEROBIC Blood Culture  adequate volume   Culture   Final    NO GROWTH 5 DAYS Performed at [facility], [place] [address] [place], [place] [address]    Report Status 11/23/2020 FINAL  Final  Culture, blood (routine x 2)     Status: None   Collection Time: 11/18/20 10:03 PM   Specimen: BLOOD  Result Value Ref Range Status   Specimen Description BLOOD SITE NOT SPECIFIED  Final   Special Requests   Final    BOTTLES DRAWN AEROBIC AND ANAEROBIC Blood Culture adequate volume   Culture   Final    NO GROWTH 5 DAYS Performed at [facility], [place] [address] [place], [place] [address]    Report Status 11/23/2020 FINAL  Final  MRSA PCR Screening     Status: None   Collection Time: 11/19/20 12:46 AM   Specimen: Nasopharyngeal  Result Value Ref Range Status   MRSA by PCR NEGATIVE NEGATIVE Final    Comment:        The GeneXpert MRSA Assay (FDA approved for NASAL specimens only), is one component of a comprehensive MRSA colonization surveillance program. It is not intended to diagnose MRSA infection nor to guide or monitor treatment for MRSA infections. Performed at [facility], [place] [address] [place], [place] [address]   Aerobic/Anaerobic Culture w Gram Stain (surgical/deep wound)     Status: None (Preliminary result)   Collection Time: 11/20/20  6:53 PM   Specimen: PATH Cytology Misc. fluid; Body Fluid  Result Value Ref Range Status   Specimen Description WOUND RIGHT FOREARM  Final   Special Requests NONE  Final   Gram Stain   Final    FEW WBC PRESENT, PREDOMINANTLY PMN ABUNDANT GRAM POSITIVE COCCI Performed at [facility], [address] [place], [place] [address]    Culture   Final    ABUNDANT STAPHYLOCOCCUS AUREUS NO ANAEROBES ISOLATED; CULTURE IN PROGRESS FOR 5 DAYS    Report Status PENDING  Incomplete  Organism ID, Bacteria STAPHYLOCOCCUS AUREUS  Final      Susceptibility   Staphylococcus aureus - MIC*    CIPROFLOXACIN <=0.5 SENSITIVE Sensitive     ERYTHROMYCIN  <=0.25 SENSITIVE Sensitive     GENTAMICIN <=0.5 SENSITIVE Sensitive     OXACILLIN 0.5 SENSITIVE Sensitive     TETRACYCLINE <=1 SENSITIVE Sensitive     VANCOMYCIN <=0.5 SENSITIVE Sensitive     TRIMETH/SULFA <=10 SENSITIVE Sensitive     CLINDAMYCIN <=0.25 SENSITIVE Sensitive     RIFAMPIN <=0.5 SENSITIVE Sensitive     Inducible Clindamycin NEGATIVE Sensitive     * ABUNDANT STAPHYLOCOCCUS AUREUS     Janene Madeira, MSN, NP-C [facility] [email or web address] Pager: [phone] Office: [phone] [place]: [phone]

## 2020-11-24 NOTE — Progress Notes (Signed)
Providing Compassionate, Quality Care - Together   Subjective: Nurse reports no issues overnight. Patient is intermittently confused.  Objective: Vital signs in last 24 hours: Temp:  [98 F (36.7 C)-98.5 F (36.9 C)] 98.1 F (36.7 C) (03/29 0806) Pulse Rate:  [80-101] 101 (03/29 1124) Resp:  [18-29] 18 (03/29 0806) BP: (142-174)/(71-95) 163/86 (03/29 1124) SpO2:  [92 %-98 %] 98 % (03/29 0806) Weight:  [97.6 kg] 97.6 kg (03/28 2331)  Intake/Output from previous day: 03/28 0701 - 03/29 0700 In: 1373 [P.O.:750; I.V.:23; IV Piggyback:600] Out: 2195 [Urine:2195] Intake/Output this shift: No intake/output data recorded.  Alert and oriented to self PERRLA CN II-XII grossly intact Speech clear MAE,Strength and sensation intact Incision is covered with Honeycomb dressing and Steri Strips; Dressing with dried sanguinous drainage. Not actively bleeding   Lab Results: Recent Labs    11/22/20 0604 11/24/20 0257  WBC 25.2* 22.7*  HGB 11.4* 12.1*  HCT 34.6* 36.3*  PLT 388 465*   BMET Recent Labs    11/23/20 1000 11/24/20 0257  NA 137 137  K 3.1* 3.5  CL 105 105  CO2 22 23  GLUCOSE 118* 178*  BUN 22 21  CREATININE 0.90 0.91  CALCIUM 7.8* 7.8*    Studies/Results: [place] EKG SITE RITE  Result Date: 11/23/2020 If Site Rite image not attached, placement could not be confirmed due to current cardiac rhythm.  US Abdomen Limited RUQ (LIVER/GB)  Result Date: 11/24/2020 CLINICAL DATA:  Abnormal liver function tests. EXAM: ULTRASOUND ABDOMEN LIMITED RIGHT UPPER QUADRANT COMPARISON:  None. FINDINGS: Gallbladder: No gallstones or wall thickening visualized. No sonographic Murphy sign noted by sonographer. Sludge is noted within gallbladder lumen. Common bile duct: Diameter: 3 mm which is within normal limits. Liver: No focal lesion identified. Within normal limits in parenchymal echogenicity. Portal vein is patent on color Doppler imaging with normal direction of blood flow  towards the liver. Other: None. IMPRESSION: Gallbladder sludge. No other abnormality seen in the right upper quadrant of the abdomen. Electronically Signed   By: Lupita Raider M.D.   On: 11/24/2020 08:44    Assessment/Plan: Mr. Spiering presented to the [facility] office on 11/18/2020 appearing acutely ill, with severe neck pain. A stat cervical MRI was completed, showing a cervical epidural abscess. He underwent emergent partial C2andcomplete C3, C4, C5, C6, C7 laminectomy withevacuation of epidural abscess on 11/18/2020 by Dr. Jordan Likes. Wound cultures growing gram positive cocci (MSSA). No growth in blood cultures. Mr. Musich has poorly managed type 2 diabetes, with a hemoglobin A1C of 13.9. He transitioned off the insulin drip on 11/17/2020 and is now on SSI. He became hypertensive on the evening of 11/17/2020, not responding to p.r.n. antihypertensives. He was started on a Cardene drip. He has since transitioned to PO antihypertensives.He developed acute edema and erythema in his right upper extremity on 11/19/2020. A venous ultrasound was performed and DVT was ruled out. CT scan revealed an intramuscular abscess. Dr. Aundria Rud of orthopedics performed an incision and drainage of the patient's right forearm on 11/20/2020. Patient was left intubated following surgery and extubated on 11/22/2020. Thoracic MRI on 11/21/2020 did not show any epidural abcess. Switched to nafcillin on 11/22/2020. Changed to Ancef on 11/24/2020. Will require 6 weeks of abx.   LOS: 6 days    -Continue supportive care -Therapies recommending CIR   Val Eagle, DNP, AGNP-C Nurse Practitioner  [facility] [address], [place], [place] [address] P: [phone]    F: [phone]  11/24/2020, 11:55 AM

## 2020-11-24 NOTE — Progress Notes (Addendum)
Consult Progress Note    EUFEMIO STRAHM III   FKC:[id number]  DOB: March 12, 1956  DOA: 11/18/2020     6  PCP: Patient, No Pcp Per (Inactive)  CC: neck pain  Hospital Course: Mr. Roam is a 65 yo male with PMH uncontrolled DMII who presented with neck pain.  He was found to have cervical spine epidural abscess and was taken for emergent laminectomy/abscess evacuation on admission.  Further work-up then revealed a right forearm abscess that was also drained by orthopedic surgery.  He was initially treated in the ICU and able to be transferred out on 11/23/2020. Infectious disease was also consulted during hospitalization.  His cervical and right forearm abscesses have grown MSSA.  He has been treated with antibiotics and is deescalated down to Ancef to complete total of 6-week course.  Interval History:  No events overnight.  Resting in bed in no distress this morning.  Remains a difficult historian and was not able to fully answer questions although this was my first time meeting patient as well.  Endorses some ongoing neck pain and pain in his right arm which is understandable and expected from surgery.  ROS: Constitutional: negative for chills and fevers, Respiratory: negative for cough, Cardiovascular: negative for chest pain and Gastrointestinal: negative for abdominal pain  Assessment & Plan: Acute hypoxemic respiratory failure - s/p ICU and intubation for surgery; extubated on 3/27 -Continue aspiration precautions -Wean off oxygen as able  Epidural abscess Right forearm abscess -Growing MSSA in both cultures  -Deescalated down to Ancef per ID.  Plan is for 6-week course total -PICC line in place  Physical deconditioning -Follow-up PT evaluations  Hypertension Hypertensive urgency -Initially required Cardene infusion in the ICU -Continue Lopressor and amlodipine -Start lisinopril  DM2 - continue SSI and CBG monitoring   Hyponatremia Hypokalemia -Replete potassium  as needed  Transaminitis -Initially unclear etiology.  Possibly cholestasis of sepsis versus drug-induced -GGT elevated: Supports hepatic etiology RUQ ultrasound shows gallbladder sludge; no other abnormalities - LFTs downtrending -Follow CMP daily  Normocytic anemia -Check iron studies and folate, B12   Old records reviewed in assessment of this patient  Antimicrobials: Cefepime 3/23 >> 3/24 Nafcillin 3/27 >> 3/29 Ancef 3/25>> current   DVT prophylaxis: SCD's Start: 11/18/20 2300   Code Status:   Code Status: Full Code  Objective: Blood pressure (!) 174/85, pulse 94, temperature 97.9 F (36.6 C), temperature source Oral, resp. rate 18, height '5\' 6"'  (1.676 m), weight 97.6 kg, SpO2 99 %.  Examination: General appearance: alert, cooperative, no distress and slowed mentation Head: Normocephalic, without obvious abnormality, atraumatic Eyes: EOMI  Neck: honeycomb dressing in place Lungs: clear to auscultation bilaterally Heart: regular rate and rhythm and S1, S2 normal Abdomen: normal findings: bowel sounds normal and soft, non-tender Extremities: RUE wrapped in dressing with edematous fingers noted  Skin: mobility and turgor normal Neurologic: Moves all 4 extremities, follows commands   Procedures:   11/18/20 Laminectomy/abscess evacuation  3/23 cervical epidural abscess> MSSA  11/20/2020 I& D of right shoulder by orthopedics  ETT 3/25 >>3/27  3/25 R forearm soft tissue abscess > MSSA  Data Reviewed: I have personally reviewed following labs and imaging studies Results for orders placed or performed during the hospital encounter of 11/18/20 (from the past 24 hour(s))  Glucose, capillary     Status: Abnormal   Collection Time: 11/23/20  7:23 PM  Result Value Ref Range   Glucose-Capillary 248 (H) 70 - 99 mg/dL  Basic metabolic panel  Status: Abnormal   Collection Time: 11/24/20  2:57 AM  Result Value Ref Range   Sodium 137 135 - 145 mmol/L   Potassium 3.5  3.5 - 5.1 mmol/L   Chloride 105 98 - 111 mmol/L   CO2 23 22 - 32 mmol/L   Glucose, Bld 178 (H) 70 - 99 mg/dL   BUN 21 8 - 23 mg/dL   Creatinine, Ser 0.91 0.61 - 1.24 mg/dL   Calcium 7.8 (L) 8.9 - 10.3 mg/dL   GFR, Estimated >60 >60 mL/min   Anion gap 9 5 - 15  Hepatic function panel     Status: Abnormal   Collection Time: 11/24/20  2:57 AM  Result Value Ref Range   Total Protein 5.5 (L) 6.5 - 8.1 g/dL   Albumin 1.2 (L) 3.5 - 5.0 g/dL   AST 74 (H) 15 - 41 U/L   ALT 95 (H) 0 - 44 U/L   Alkaline Phosphatase 232 (H) 38 - 126 U/L   Total Bilirubin 1.9 (H) 0.3 - 1.2 mg/dL   Bilirubin, Direct 0.7 (H) 0.0 - 0.2 mg/dL   Indirect Bilirubin 1.2 (H) 0.3 - 0.9 mg/dL  Gamma GT     Status: Abnormal   Collection Time: 11/24/20  2:57 AM  Result Value Ref Range   GGT 178 (H) 7 - 50 U/L  CBC     Status: Abnormal   Collection Time: 11/24/20  2:57 AM  Result Value Ref Range   WBC 22.7 (H) 4.0 - 10.5 K/uL   RBC 3.90 (L) 4.22 - 5.81 MIL/uL   Hemoglobin 12.1 (L) 13.0 - 17.0 g/dL   HCT 36.3 (L) 39.0 - 52.0 %   MCV 93.1 80.0 - 100.0 fL   MCH 31.0 26.0 - 34.0 pg   MCHC 33.3 30.0 - 36.0 g/dL   RDW 12.8 11.5 - 15.5 %   Platelets 465 (H) 150 - 400 K/uL   nRBC 0.0 0.0 - 0.2 %  Lipid panel     Status: Abnormal   Collection Time: 11/24/20  9:00 AM  Result Value Ref Range   Cholesterol 102 0 - 200 mg/dL   Triglycerides 152 (H) <150 mg/dL   HDL 15 (L) >40 mg/dL   Total CHOL/HDL Ratio 6.8 RATIO   VLDL 30 0 - 40 mg/dL   LDL Cholesterol 57 0 - 99 mg/dL  Glucose, capillary     Status: Abnormal   Collection Time: 11/24/20 12:11 PM  Result Value Ref Range   Glucose-Capillary 130 (H) 70 - 99 mg/dL    Recent Results (from the past 240 hour(s))  SARS Coronavirus 2 by RT PCR (hospital order, performed in [place] hospital lab) Nasopharyngeal Nasopharyngeal Swab     Status: None   Collection Time: 11/18/20  4:53 PM   Specimen: Nasopharyngeal Swab  Result Value Ref Range Status   SARS Coronavirus 2  NEGATIVE NEGATIVE Final    Comment: (NOTE) SARS-CoV-2 target nucleic acids are NOT DETECTED.  The SARS-CoV-2 RNA is generally detectable in upper and lower respiratory specimens during the acute phase of infection. The lowest concentration of SARS-CoV-2 viral copies this assay can detect is 250 copies / mL. A negative result does not preclude SARS-CoV-2 infection and should not be used as the sole basis for treatment or other patient management decisions.  A negative result may occur with improper specimen collection / handling, submission of specimen other than nasopharyngeal swab, presence of viral mutation(s) within the areas targeted by this assay, and inadequate number  of viral copies (<250 copies / mL). A negative result must be combined with clinical observations, patient history, and epidemiological information.  Fact Sheet for Patients:   StrictlyIdeas.no  Fact Sheet for Healthcare Providers: BankingDealers.co.za  This test is not yet approved or  cleared by the [place] FDA and has been authorized for detection and/or diagnosis of SARS-CoV-2 by FDA under an Emergency Use Authorization (EUA).  This EUA will remain in effect (meaning this test can be used) for the duration of the COVID-19 declaration under Section 564(b)(1) of the Act, 21 U.S.C. section 360bbb-3(b)(1), unless the authorization is terminated or revoked sooner.  Performed at [facility], [place] [address], [place], [place] [address]   Aerobic/Anaerobic Culture w Gram Stain (surgical/deep wound)     Status: None   Collection Time: 11/18/20  7:06 PM   Specimen: Abscess  Result Value Ref Range Status   Specimen Description ABSCESS  Final   Special Requests CERVICAL  Final   Gram Stain   Final    FEW WBC PRESENT, PREDOMINANTLY PMN ABUNDANT GRAM POSITIVE COCCI    Culture   Final    FEW STAPHYLOCOCCUS AUREUS NO ANAEROBES ISOLATED Performed at [facility], [place] [address] [place], [place] [address]    Report Status 11/23/2020 FINAL  Final   Organism ID, Bacteria STAPHYLOCOCCUS AUREUS  Final      Susceptibility   Staphylococcus aureus - MIC*    CIPROFLOXACIN <=0.5 SENSITIVE Sensitive     ERYTHROMYCIN <=0.25 SENSITIVE Sensitive     GENTAMICIN <=0.5 SENSITIVE Sensitive     OXACILLIN <=0.25 SENSITIVE Sensitive     TETRACYCLINE <=1 SENSITIVE Sensitive     VANCOMYCIN <=0.5 SENSITIVE Sensitive     TRIMETH/SULFA <=10 SENSITIVE Sensitive     CLINDAMYCIN <=0.25 SENSITIVE Sensitive     RIFAMPIN <=0.5 SENSITIVE Sensitive     Inducible Clindamycin NEGATIVE Sensitive     * FEW STAPHYLOCOCCUS AUREUS  Culture, blood (routine x 2)     Status: None   Collection Time: 11/18/20  9:56 PM   Specimen: BLOOD  Result Value Ref Range Status   Specimen Description BLOOD SITE NOT SPECIFIED  Final   Special Requests   Final    BOTTLES DRAWN AEROBIC AND ANAEROBIC Blood Culture adequate volume   Culture   Final    NO GROWTH 5 DAYS Performed at [facility], [place] [address], [place], [place] [address]    Report Status 11/23/2020 FINAL  Final  Culture, blood (routine x 2)     Status: None   Collection Time: 11/18/20 10:03 PM   Specimen: BLOOD  Result Value Ref Range Status   Specimen Description BLOOD SITE NOT SPECIFIED  Final   Special Requests   Final    BOTTLES DRAWN AEROBIC AND ANAEROBIC Blood Culture adequate volume   Culture   Final    NO GROWTH 5 DAYS Performed at [facility], [place] [address] [place], [place] [address]    Report Status 11/23/2020 FINAL  Final  MRSA PCR Screening     Status: None   Collection Time: 11/19/20 12:46 AM   Specimen: Nasopharyngeal  Result Value Ref Range Status   MRSA by PCR NEGATIVE NEGATIVE Final    Comment:        The GeneXpert MRSA Assay (FDA approved for NASAL specimens only), is one component of a comprehensive MRSA colonization surveillance program. It is not intended to diagnose  MRSA infection nor to guide or  monitor treatment for MRSA infections. Performed at [facility], [place] [address] [place], [place] [address]   Aerobic/Anaerobic Culture w Gram Stain (surgical/deep wound)     Status: None (Preliminary result)   Collection Time: 11/20/20  6:53 PM   Specimen: PATH Cytology Misc. fluid; Body Fluid  Result Value Ref Range Status   Specimen Description WOUND RIGHT FOREARM  Final   Special Requests NONE  Final   Gram Stain   Final    FEW WBC PRESENT, PREDOMINANTLY PMN ABUNDANT GRAM POSITIVE COCCI Performed at [facility], [address] [place], [place] [address]    Culture   Final    ABUNDANT STAPHYLOCOCCUS AUREUS NO ANAEROBES ISOLATED; CULTURE IN PROGRESS FOR 5 DAYS    Report Status PENDING  Incomplete   Organism ID, Bacteria STAPHYLOCOCCUS AUREUS  Final      Susceptibility   Staphylococcus aureus - MIC*    CIPROFLOXACIN <=0.5 SENSITIVE Sensitive     ERYTHROMYCIN <=0.25 SENSITIVE Sensitive     GENTAMICIN <=0.5 SENSITIVE Sensitive     OXACILLIN 0.5 SENSITIVE Sensitive     TETRACYCLINE <=1 SENSITIVE Sensitive     VANCOMYCIN <=0.5 SENSITIVE Sensitive     TRIMETH/SULFA <=10 SENSITIVE Sensitive     CLINDAMYCIN <=0.25 SENSITIVE Sensitive     RIFAMPIN <=0.5 SENSITIVE Sensitive     Inducible Clindamycin NEGATIVE Sensitive     * ABUNDANT STAPHYLOCOCCUS AUREUS     Radiology Studies: [place] EKG SITE RITE  Result Date: 11/23/2020 If Site Rite image not attached, placement could not be confirmed due to current cardiac rhythm.  US Abdomen Limited RUQ (LIVER/GB)  Result Date: 11/24/2020 CLINICAL DATA:  Abnormal liver function tests. EXAM: ULTRASOUND ABDOMEN LIMITED RIGHT UPPER QUADRANT COMPARISON:  None. FINDINGS: Gallbladder: No gallstones or wall thickening visualized. No sonographic Murphy sign noted by sonographer. Sludge is noted within gallbladder lumen. Common bile duct: Diameter: 3 mm which is within normal limits. Liver: No focal lesion  identified. Within normal limits in parenchymal echogenicity. Portal vein is patent on color Doppler imaging with normal direction of blood flow towards the liver. Other: None. IMPRESSION: Gallbladder sludge. No other abnormality seen in the right upper quadrant of the abdomen. Electronically Signed   By: Marijo Conception M.D.   On: 11/24/2020 08:44   US Abdomen Limited RUQ (LIVER/GB)  Final Result    [place] EKG SITE RITE  Final Result    MR THORACIC SPINE W WO CONTRAST  Final Result    DG Abd 1 View  Final Result    DG Abd 1 View  Final Result    DG Chest Port 1 View  Final Result    CT FOREARM RIGHT W CONTRAST  Final Result    VAS [place] UPPER EXTREMITY VENOUS DUPLEX  Final Result    DG CHEST PORT 1 VIEW  Final Result      Scheduled Meds: . amLODipine  10 mg Oral Daily  . Chlorhexidine Gluconate Cloth  6 each Topical Daily  . insulin aspart  0-15 Units Subcutaneous TID WC  . insulin aspart  0-5 Units Subcutaneous QHS  . insulin detemir  10 Units Subcutaneous QHS  . insulin starter kit- pen needles  1 kit Other Once  . lisinopril  20 mg Oral Daily  . liver oil-zinc oxide   Topical BID  . living well with diabetes book   Does not apply Once  . mouth rinse  15 mL Mouth Rinse BID  . metoprolol tartrate  50 mg Oral BID  . senna-docusate  1 tablet Oral BID  . sodium chloride flush  10-40 mL Intracatheter Q12H  . sodium chloride flush  10-40 mL Intracatheter Q12H  . sodium chloride flush  3 mL Intravenous Q12H   PRN Meds: acetaminophen **OR** acetaminophen, bisacodyl, diazepam, HYDROcodone-acetaminophen, menthol-cetylpyridinium **OR** phenol, ondansetron **OR** ondansetron (ZOFRAN) IV, polyethylene glycol, sodium chloride flush, sodium chloride flush, sodium chloride flush, sodium phosphate Continuous Infusions: . sodium chloride Stopped (11/18/20 2307)  .  ceFAZolin (ANCEF) IV 2 g (11/24/20 1459)     LOS: 6 days  Time spent: Greater than 50% of the 35 minute visit was spent  in counseling/coordination of care for the patient as laid out in the A&P.   Dwyane Dee, MD [facility] 11/24/2020, 3:19 PM

## 2020-11-24 NOTE — Progress Notes (Signed)
Inpatient Rehab Admissions Coordinator:   At this time we are recommending a CIR consult and I will place an order per our protocol.    Estill Dooms, PT, DPT Admissions Coordinator [phone] 11/24/20  12:55 PM

## 2020-11-24 NOTE — Progress Notes (Signed)
PT Cancellation Note  Patient Details Name: Daniel Cline MRN: [id number] DOB: [birth year]   Cancelled Treatment:    Reason Eval/Treat Not Completed: Other (comment);Pain limiting ability to participate (pt deferring until after eating). Checked on pt twice this am, first time pt reported he did not feel well and was getting pain meds. Second time he did not want to try to mobilize until he had eaten lunch. Will check back later today as time allows, otherwise will put on schedule for tomorrow.   Lyanne Co, PT  Acute Rehab Services  Pager [phone] Office [phone]    Lawana Chambers Lennox Leikam 11/24/2020, 12:20 PM

## 2020-11-24 NOTE — Progress Notes (Signed)
Dr Antionette Char, hospitalist, notified of patient's continued hypertension despite receiving available PRN anitihypertensive meds.

## 2020-11-24 NOTE — Care Management Important Message (Signed)
Important Message  Patient Details  Name: Daniel Cline MRN: [id number] Date of Birth: Feb 03, 1956   Medicare Important Message Given:  Yes     [name] 11/24/2020, 2:30 PM

## 2020-11-25 DIAGNOSIS — M60000 Infective myositis, unspecified right arm: Secondary | ICD-10-CM

## 2020-11-25 LAB — SEDIMENTATION RATE: Sed Rate: 116 mm/hr — ABNORMAL HIGH (ref 0–16)

## 2020-11-25 LAB — CBC WITH DIFFERENTIAL/PLATELET
Abs Immature Granulocytes: 0.41 10*3/uL — ABNORMAL HIGH (ref 0.00–0.07)
Basophils Absolute: 0.1 10*3/uL (ref 0.0–0.1)
Basophils Relative: 0 %
Eosinophils Absolute: 0.1 10*3/uL (ref 0.0–0.5)
Eosinophils Relative: 0 %
HCT: 36.4 % — ABNORMAL LOW (ref 39.0–52.0)
Hemoglobin: 12.2 g/dL — ABNORMAL LOW (ref 13.0–17.0)
Immature Granulocytes: 2 %
Lymphocytes Relative: 12 %
Lymphs Abs: 2.5 10*3/uL (ref 0.7–4.0)
MCH: 30.8 pg (ref 26.0–34.0)
MCHC: 33.5 g/dL (ref 30.0–36.0)
MCV: 91.9 fL (ref 80.0–100.0)
Monocytes Absolute: 1.5 10*3/uL — ABNORMAL HIGH (ref 0.1–1.0)
Monocytes Relative: 7 %
Neutro Abs: 16.3 10*3/uL — ABNORMAL HIGH (ref 1.7–7.7)
Neutrophils Relative %: 79 %
Platelets: 538 10*3/uL — ABNORMAL HIGH (ref 150–400)
RBC: 3.96 MIL/uL — ABNORMAL LOW (ref 4.22–5.81)
RDW: 12.9 % (ref 11.5–15.5)
WBC: 20.7 10*3/uL — ABNORMAL HIGH (ref 4.0–10.5)
nRBC: 0 % (ref 0.0–0.2)

## 2020-11-25 LAB — GLUCOSE, CAPILLARY
Glucose-Capillary: 162 mg/dL — ABNORMAL HIGH (ref 70–99)
Glucose-Capillary: 141 mg/dL — ABNORMAL HIGH (ref 70–99)
Glucose-Capillary: 192 mg/dL — ABNORMAL HIGH (ref 70–99)
Glucose-Capillary: 185 mg/dL — ABNORMAL HIGH (ref 70–99)
Glucose-Capillary: 170 mg/dL — ABNORMAL HIGH (ref 70–99)

## 2020-11-25 LAB — BASIC METABOLIC PANEL
Anion gap: 9 (ref 5–15)
BUN: 14 mg/dL (ref 8–23)
CO2: 23 mmol/L (ref 22–32)
Calcium: 8 mg/dL — ABNORMAL LOW (ref 8.9–10.3)
Chloride: 105 mmol/L (ref 98–111)
Creatinine, Ser: 0.77 mg/dL (ref 0.61–1.24)
GFR, Estimated: >60 mL/min (ref >60)
Glucose, Bld: 155 mg/dL — ABNORMAL HIGH (ref 70–99)
Potassium: 3 mmol/L — ABNORMAL LOW (ref 3.5–5.1)
Sodium: 137 mmol/L (ref 135–145)

## 2020-11-25 LAB — HEPATIC FUNCTION PANEL
ALT: 53 U/L — ABNORMAL HIGH (ref 0–44)
AST: 31 U/L (ref 15–41)
Albumin: 1.4 g/dL — ABNORMAL LOW (ref 3.5–5.0)
Alkaline Phosphatase: 212 U/L — ABNORMAL HIGH (ref 38–126)
Bilirubin, Direct: 0.1 mg/dL (ref 0.0–0.2)
Indirect Bilirubin: 1 mg/dL — ABNORMAL HIGH (ref 0.3–0.9)
Total Bilirubin: 1.1 mg/dL (ref 0.3–1.2)
Total Protein: 5.8 g/dL — ABNORMAL LOW (ref 6.5–8.1)

## 2020-11-25 LAB — AEROBIC/ANAEROBIC CULTURE W GRAM STAIN (SURGICAL/DEEP WOUND)

## 2020-11-25 LAB — C-REACTIVE PROTEIN: CRP: 12 mg/dL — ABNORMAL HIGH (ref <1.0)

## 2020-11-25 LAB — MAGNESIUM: Magnesium: 1.8 mg/dL (ref 1.7–2.4)

## 2020-11-25 MED ORDER — MAGNESIUM OXIDE 400 (241.3 MG) MG PO TABS
800.0000 mg | ORAL_TABLET | Freq: Once | ORAL | Status: AC
  Administered 2020-11-25: 800 mg via ORAL
  Filled 2020-11-25: qty 2

## 2020-11-25 MED ORDER — LIVING WELL WITH DIABETES BOOK
Freq: Once | Status: AC
  Filled 2020-11-25: qty 1

## 2020-11-25 MED ORDER — OXYCODONE HCL 5 MG PO TABS
5.0000 mg | ORAL_TABLET | ORAL | Status: DC | PRN
  Administered 2020-11-25 – 2020-11-28 (×8): 5 mg via ORAL
  Filled 2020-11-25 (×8): qty 1

## 2020-11-25 MED ORDER — ACETAMINOPHEN 325 MG PO TABS
650.0000 mg | ORAL_TABLET | ORAL | Status: DC
  Administered 2020-11-25 – 2020-11-28 (×16): 650 mg via ORAL
  Filled 2020-11-25 (×16): qty 2

## 2020-11-25 MED ORDER — FERROUS SULFATE 325 (65 FE) MG PO TABS
325.0000 mg | ORAL_TABLET | Freq: Every day | ORAL | Status: DC
  Administered 2020-11-25 – 2020-11-28 (×4): 325 mg via ORAL
  Filled 2020-11-25 (×4): qty 1

## 2020-11-25 MED ORDER — POTASSIUM CHLORIDE CRYS ER 20 MEQ PO TBCR
40.0000 meq | EXTENDED_RELEASE_TABLET | ORAL | Status: AC
  Administered 2020-11-25 (×2): 40 meq via ORAL
  Filled 2020-11-25 (×2): qty 2

## 2020-11-25 MED ORDER — LISINOPRIL 20 MG PO TABS
40.0000 mg | ORAL_TABLET | Freq: Every day | ORAL | Status: DC
  Administered 2020-11-25 – 2020-11-28 (×4): 40 mg via ORAL
  Filled 2020-11-25 (×4): qty 2

## 2020-11-25 NOTE — Progress Notes (Signed)
PHARMACY CONSULT NOTE FOR:  OUTPATIENT  PARENTERAL ANTIBIOTIC THERAPY (OPAT)  Indication: MSSA C-spine epidural abscess Regimen: Cefazolin 2g IV every 8 hours End date: 01/01/21  IV antibiotic discharge orders are pended. To discharging provider:  please sign these orders via discharge navigator,  Select New Orders & click on the button choice - Manage This Unsigned Work.     Thank you for allowing pharmacy to be a part of this patient's care.  Georgina Pillion, PharmD, BCPS 11/25/2020 12:16 PM
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
## 2020-11-25 NOTE — Progress Notes (Signed)
Inpatient Diabetes Program Recommendations  AACE/ADA: New Consensus Statement on Inpatient Glycemic Control (2015)  Target Ranges:  Prepandial:   less than 140 mg/dL      Peak postprandial:   less than 180 mg/dL (1-2 hours)      Critically ill patients:  140 - 180 mg/dL   Lab Results  Component Value Date   GLUCAP 192 (H) 11/25/2020   HGBA1C 13.9 (H) 11/19/2020    Review of Glycemic Control Results for Daniel Cline, Daniel Cline (MRN [id number]) as of 11/25/2020 13:07  Ref. Range 11/24/2020 16:44 11/24/2020 21:26 11/25/2020 06:20 11/25/2020 08:03 11/25/2020 12:54  Glucose-Capillary Latest Ref Range: 70 - 99 mg/dL 756 (H) 433 (H) 295 (H) 141 (H) 192 (H)   Diabetes history: DM 2 Outpatient Diabetes medications:  Metformin 1000 mg bid Current orders for Inpatient glycemic control:  Levemir 10 units q HS Novolog moderate tid with meals and HS  Inpatient Diabetes Program Recommendations:    Blood sugars very well controlled with only 10 units of Levemir.  Spoke with patient's son, Daniel Leriche, on the phone regarding DM and potential need for insulin.  He endorses that his dad drank lots of juices and sodas prior to admit.  He states that he will be the one to administer insulin when his dad goes home.  Sent him video on insulin pen instruction.  Also re-ordered DM booklet for he and his wife.  We briefly discussed the importance of eliminating sugar from beverages for his father.  He asked if artificial sweeteners are okay and I told him yes, in moderation.  Discussed signs, symptoms and treatment of low blood sugars as well and son verbalized understanding.  Plan is possibly for d/c to CIR.  Will need continued DM education at d/c as well.  Hopefully can d/c on Levemir (one shot a day) and Metformin at d/c.   Thanks,  Beryl Meager, RN, BC-ADM Inpatient Diabetes Coordinator Pager [phone]

## 2020-11-25 NOTE — Progress Notes (Signed)
Overall stable.  No new issues or problems.  Patient's mental status continues to slowly improve.  Neurologic function stable.  Wound healing well.  Continue IV antibiotics.  No new recommendations from my standpoint.  Mobilize as tolerated.

## 2020-11-25 NOTE — Progress Notes (Signed)
[facility]  Date of Admission:  11/18/2020      Total days of antibiotics 8  Cefazolin 3/25 >> 3/27, 3/30 >> Current   Nafcillin 3/27 >> 3/29  Cefepime 3/23 >> 3/24    ASSESSMENT: Daniel Cline is a 65 y.o. male with C3-C7 epidural abscess s/p evacuation with Dr. Annette Stable. MRI of C-spine reveal epidural abscess. T-spine reveals no residual fluid collections in epidural space. He was not bacteremic at presentation - TTE reveals reduced EF 35-40%. 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 Growing MSSA as well. Drain has been removed. General swelling.   No other signs of metastatic infection at this time. PICC in place. Planning CIR discharge for him. Continue IV cefazolin to complete 6 weeks.   Health maintenance = hep c Ab and hiv are non-reactive   Will sign off - please call back with any charges in status or questions surrounding treatment plan.    PLAN: 1. OPAT as outlined below   2. ?Cardiology follow up with EF 35% and AI.    OPAT ORDERS:  Diagnosis: C-spine epidural abscess   Culture Result: MSSA   No Known Allergies   Discharge antibiotics to be given via PICC line:  Per pharmacy protocol CEFAZOLIN 2 gm IV Q8gh   Duration: 6 weeks   End Date: May 6th     [place] Per Protocol with [employer] Use: Home health RN for IV administration and teaching, line care and labs.    Labs weekly while on IV antibiotics: _x_ CBC with differential __ BMP _x_ CMP _x_ CRP _x_ ESR __ Vancomycin trough __ CK  _x_ Please pull PIC at completion of IV antibiotics __ Please leave PIC in place until doctor has seen patient or been notified  Fax weekly labs to [phone]  Clinic Follow Up Appt: 4/26 @ 1:45 pm with Dr. Linus Salmons     Principal Problem:   Epidural intraspinal abscess Active Problems:   Abscess   Pressure injury of  skin   Hypertension associated with diabetes ([place])   Staph aureus infection   HTN (hypertension)   . amLODipine  10 mg Oral Daily  . Chlorhexidine Gluconate Cloth  6 each Topical Daily  . ferrous sulfate  325 mg Oral Q breakfast  . insulin aspart  0-15 Units Subcutaneous TID WC  . insulin aspart  0-5 Units Subcutaneous QHS  . insulin detemir  10 Units Subcutaneous QHS  . insulin starter kit- pen needles  1 kit Other Once  . lisinopril  40 mg Oral Daily  . liver oil-zinc oxide   Topical BID  . living well with diabetes book   Does not apply Once  . mouth rinse  15 mL Mouth Rinse BID  . metoprolol tartrate  50 mg Oral BID  . senna-docusate  1 tablet Oral BID  . sodium chloride flush  10-40 mL Intracatheter Q12H  . sodium chloride flush  10-40 mL Intracatheter Q12H  . sodium chloride flush  3 mL Intravenous Q12H    SUBJECTIVE: Feeling better today. Still with oxygen. Wears it at home sometimes as needed.  No new complaints.    Review of Systems: Review of Systems  Constitutional: Negative for chills and fever.  Respiratory: Negative for cough.   Cardiovascular: Negative for chest pain.  Gastrointestinal: Positive for nausea.  Genitourinary: Negative for dysuria.  Musculoskeletal: Positive for back pain and neck pain. Negative for joint pain.  Skin: Negative for itching and rash.  Neurological: Positive for weakness. Negative for focal weakness.    No Known Allergies   OBJECTIVE: Vitals:   11/24/20 1942 11/25/20 0336 11/25/20 0810 11/25/20 0829  BP: (!) 184/79 (!) 168/88 (!) 221/97 (!) 172/90  Pulse: (!) 105 (!) 110 (!) 112 (!) 112  Resp: _0 Temp: 98.4 F (36.9 C) 97.6 F (36.4 C) 98 F (36.7 C)   TempSrc: Oral Oral Oral   SpO2: 98% 98% 100%   Weight:      Height:       Body mass index is 34.73 kg/m.   Physical Exam HENT:     Mouth/Throat:     Mouth: No oral lesions.     Dentition: Normal dentition. No dental caries.  Eyes:     General: No  scleral icterus. Cardiovascular:     Rate and Rhythm: Normal rate and regular rhythm.     Heart sounds: Normal heart sounds.  Pulmonary:     Effort: Pulmonary effort is normal.     Breath sounds: Normal breath sounds.  Abdominal:     General: There is no distension.     Palpations: Abdomen is soft.     Tenderness: There is no abdominal tenderness.  Musculoskeletal:     Comments: B/L swelling of UE L>R. Moves all fingers and +sensation. R arm with clean/dry ace wrap   Lymphadenopathy:     Cervical: No cervical adenopathy.  Skin:    General: Skin is warm and dry.     Findings: No rash.     Comments: LUE picc clean/dry   Neurological:     Mental Status: He is alert and oriented to person, place, and time.     Lab Results Lab Results  Component Value Date   WBC 20.7 (H) 11/25/2020   HGB 12.2 (L) 11/25/2020   HCT 36.4 (L) 11/25/2020   MCV 91.9 11/25/2020   PLT 538 (H) 11/25/2020    Lab Results  Component Value Date   CREATININE 0.77 11/25/2020   BUN 14 11/25/2020   NA 137 11/25/2020   K 3.0 (L) 11/25/2020   CL 105 11/25/2020   CO2 23 11/25/2020    Lab Results  Component Value Date   ALT 95 (H) 11/24/2020   AST 74 (H) 11/24/2020   GGT 178 (H) 11/24/2020   ALKPHOS 232 (H) 11/24/2020   BILITOT 1.9 (H) 11/24/2020     Microbiology: Recent Results (from the past 240 hour(s))  SARS Coronavirus 2 by RT PCR (hospital order, performed in [place] hospital lab) Nasopharyngeal Nasopharyngeal Swab     Status: None   Collection Time: 11/18/20  4:53 PM   Specimen: Nasopharyngeal Swab  Result Value Ref Range Status   SARS Coronavirus 2 NEGATIVE NEGATIVE Final    Comment: (NOTE) SARS-CoV-2 target nucleic acids are NOT DETECTED.  The SARS-CoV-2 RNA is generally detectable in upper and lower respiratory specimens during the acute phase of infection. The lowest concentration of SARS-CoV-2 viral copies this assay can detect is 250 copies / mL. A negative result does not  preclude SARS-CoV-2 infection and should not be used as the sole basis for treatment or other patient management decisions.  A negative result may occur with improper specimen collection / handling, submission of specimen other than nasopharyngeal swab, presence of viral mutation(s) within the areas targeted by this assay,  and inadequate number of viral copies (<250 copies / mL). A negative result must be combined with clinical observations, patient history, and epidemiological information.  Fact Sheet for Patients:   StrictlyIdeas.no  Fact Sheet for Healthcare Providers: BankingDealers.co.za  This test is not yet approved or  cleared by the [place] FDA and has been authorized for detection and/or diagnosis of SARS-CoV-2 by FDA under an Emergency Use Authorization (EUA).  This EUA will remain in effect (meaning this test can be used) for the duration of the COVID-19 declaration under Section 564(b)(1) of the Act, 21 U.S.C. section 360bbb-3(b)(1), unless the authorization is terminated or revoked sooner.  Performed at [facility], [place] [address] [place], [place] [address]   Aerobic/Anaerobic Culture w Gram Stain (surgical/deep wound)     Status: None   Collection Time: 11/18/20  7:06 PM   Specimen: Abscess  Result Value Ref Range Status   Specimen Description ABSCESS  Final   Special Requests CERVICAL  Final   Gram Stain   Final    FEW WBC PRESENT, PREDOMINANTLY PMN ABUNDANT GRAM POSITIVE COCCI    Culture   Final    FEW STAPHYLOCOCCUS AUREUS NO ANAEROBES ISOLATED Performed at [facility], [place] [address] [place], [place] [address]    Report Status 11/23/2020 FINAL  Final   Organism ID, Bacteria STAPHYLOCOCCUS AUREUS  Final      Susceptibility   Staphylococcus aureus - MIC*    CIPROFLOXACIN <=0.5 SENSITIVE Sensitive     ERYTHROMYCIN <=0.25 SENSITIVE Sensitive     GENTAMICIN <=0.5 SENSITIVE Sensitive      OXACILLIN <=0.25 SENSITIVE Sensitive     TETRACYCLINE <=1 SENSITIVE Sensitive     VANCOMYCIN <=0.5 SENSITIVE Sensitive     TRIMETH/SULFA <=10 SENSITIVE Sensitive     CLINDAMYCIN <=0.25 SENSITIVE Sensitive     RIFAMPIN <=0.5 SENSITIVE Sensitive     Inducible Clindamycin NEGATIVE Sensitive     * FEW STAPHYLOCOCCUS AUREUS  Culture, blood (routine x 2)     Status: None   Collection Time: 11/18/20  9:56 PM   Specimen: BLOOD  Result Value Ref Range Status   Specimen Description BLOOD SITE NOT SPECIFIED  Final   Special Requests   Final    BOTTLES DRAWN AEROBIC AND ANAEROBIC Blood Culture adequate volume   Culture   Final    NO GROWTH 5 DAYS Performed at [facility], [place] [address] [place], [place] [address]    Report Status 11/23/2020 FINAL  Final  Culture, blood (routine x 2)     Status: None   Collection Time: 11/18/20 10:03 PM   Specimen: BLOOD  Result Value Ref Range Status   Specimen Description BLOOD SITE NOT SPECIFIED  Final   Special Requests   Final    BOTTLES DRAWN AEROBIC AND ANAEROBIC Blood Culture adequate volume   Culture   Final    NO GROWTH 5 DAYS Performed at [facility], [place] [address] [place], [place] [address]    Report Status 11/23/2020 FINAL  Final  MRSA PCR Screening     Status: None   Collection Time: 11/19/20 12:46 AM   Specimen: Nasopharyngeal  Result Value Ref Range Status   MRSA by PCR NEGATIVE NEGATIVE Final    Comment:        The GeneXpert MRSA Assay (FDA approved for NASAL specimens only), is one component of a comprehensive MRSA colonization surveillance program. It is not intended to diagnose MRSA infection nor to  guide or monitor treatment for MRSA infections. Performed at [facility], [place] [address], [place], [place] [address]   Aerobic/Anaerobic Culture w Gram Stain (surgical/deep wound)     Status: None (Preliminary result)   Collection Time: 11/20/20  6:53 PM   Specimen: PATH Cytology Misc. fluid; Body Fluid   Result Value Ref Range Status   Specimen Description WOUND RIGHT FOREARM  Final   Special Requests NONE  Final   Gram Stain   Final    FEW WBC PRESENT, PREDOMINANTLY PMN ABUNDANT GRAM POSITIVE COCCI Performed at [facility], [address], [place], [place] [address]    Culture   Final    ABUNDANT STAPHYLOCOCCUS AUREUS NO ANAEROBES ISOLATED; CULTURE IN PROGRESS FOR 5 DAYS    Report Status PENDING  Incomplete   Organism ID, Bacteria STAPHYLOCOCCUS AUREUS  Final      Susceptibility   Staphylococcus aureus - MIC*    CIPROFLOXACIN <=0.5 SENSITIVE Sensitive     ERYTHROMYCIN <=0.25 SENSITIVE Sensitive     GENTAMICIN <=0.5 SENSITIVE Sensitive     OXACILLIN 0.5 SENSITIVE Sensitive     TETRACYCLINE <=1 SENSITIVE Sensitive     VANCOMYCIN <=0.5 SENSITIVE Sensitive     TRIMETH/SULFA <=10 SENSITIVE Sensitive     CLINDAMYCIN <=0.25 SENSITIVE Sensitive     RIFAMPIN <=0.5 SENSITIVE Sensitive     Inducible Clindamycin NEGATIVE Sensitive     * ABUNDANT STAPHYLOCOCCUS AUREUS  Fungus Culture With Stain     Status: None (Preliminary result)   Collection Time: 11/20/20  6:53 PM  Result Value Ref Range Status   Fungus Stain Final report  Final    Comment: (NOTE) Performed At: [facility] [address] [place], [place] [id number] Rush Farmer MD [id number]    Fungus (Mycology) Culture PENDING  Incomplete   Fungal Source WOUND  Final    Comment: RIGHT FOREARM Performed at [facility], [place] [address], [place], [place] [address]   Fungus Culture Result     Status: None   Collection Time: 11/20/20  6:53 PM  Result Value Ref Range Status   Result 1 Comment  Final    Comment: (NOTE) KOH/Calcofluor preparation:  no fungus observed. Performed At: [facility] [address] [place], [place] [id number] Rush Farmer MD GX:[id number]      Janene Madeira, MSN, NP-C [facility] [email or web address] Pager: [phone] Office: [phone] [place]: [phone]

## 2020-11-25 NOTE — Progress Notes (Signed)
Inpatient Rehab Admissions Coordinator:   Met with patient at bedside to discuss CIR goals and expectations.  Pt is in pain on my entry and requesting HOB be lowered.  He states pain is constant 10/10.  We discussed that CIR requires pts to participate in up to 3 hours of therapy, and based on current participation and pain, I'm not sure whether he would be able to tolerate that intensity.  Pt states he's not sure whether he could tolerate it, but is perseverative on his pain.  Will follow along for improved pain control in the next 1-2 days and for better participation with therapy.  RN aware of request for pain medication after I left patient.    Shann Medal, PT, DPT Admissions Coordinator [phone] 11/25/20  3:16 PM

## 2020-11-25 NOTE — Progress Notes (Addendum)
Consult Progress Note    Daniel Cline   UXL:[id number]  DOB: Dec 02, 1955  DOA: 11/18/2020     7  PCP: Patient, No Pcp Per (Inactive)  CC: neck pain  Hospital Course: Daniel Cline is a 65 yo male with PMH uncontrolled DMII who presented with neck pain.  He was found to have cervical spine epidural abscess and was taken for emergent laminectomy/abscess evacuation on admission.  Further work-up then revealed a right forearm abscess that was also drained by orthopedic surgery.  He was initially treated in the ICU and able to be transferred out on 11/23/2020. Infectious disease was also consulted during hospitalization.  His cervical and right forearm abscesses have grown MSSA.  He has been treated with antibiotics and is deescalated down to Ancef to complete total of 6-week course.  Interval History:  No events overnight.  Some confusion noted this morning which appears to be intermittent for him.  Pain is improved some in his right arm. Plans are for CIR after discharge if able.  ROS: Constitutional: negative for chills and fevers, Respiratory: negative for cough, Cardiovascular: negative for chest pain and Gastrointestinal: negative for abdominal pain  Assessment & Plan: Acute hypoxemic respiratory failure - s/p ICU and intubation for surgery; extubated on 3/27 -Continue aspiration precautions -Wean off oxygen as able  Epidural abscess Right forearm abscess -Growing MSSA in both cultures  - s/p "Partial C2, complete C3, C4, C5, C6, C7 laminectomy and evacuation of epidural abscess" with neurosurgery on 3/23 - s/p R forearm I&D on 3/25 -Deescalated down to Ancef per ID.  Plan is for 6-week course total.  End date 01/01/2021 -PICC line in place - seen by ortho on 3/28: remove sutures in 8-10 days; follow up with [facility] in 10 days with Jonelle Sidle PA-C if outpatient for wound check/suture removal. ROM/weight bearing as tolerated; change dry dressings as needed if  saturated  Physical deconditioning -Follow-up PT evaluations  Hypertension Hypertensive urgency -Initially required Cardene infusion in the ICU -Continue Lopressor and amlodipine -Start lisinopril; adjusting as needed -Use labetalol or hydralazine as needed also  DM2 - continue SSI and CBG monitoring   Hyponatremia Hypokalemia -Replete potassium as needed  Transaminitis -Initially unclear etiology.  Possibly cholestasis of sepsis versus drug-induced -GGT elevated: Supports hepatic etiology RUQ ultrasound shows gallbladder sludge; no other abnormalities - LFTs downtrending -Follow CMP daily  Normocytic anemia -Check iron studies (mixed) and folate (15.3), B12 (1162) - start on ferrous sulfate PO   Old records reviewed in assessment of this patient  Antimicrobials: Cefepime 3/23 >> 3/24 Nafcillin 3/27 >> 3/29 Ancef 3/25>> current   DVT prophylaxis: SCD's Start: 11/18/20 2300   Code Status:   Code Status: Full Code  Objective: Blood pressure (!) 193/92, pulse 88, temperature 98 F (36.7 C), temperature source Oral, resp. rate 18, height '5\' 6"'  (1.676 m), weight 97.6 kg, SpO2 100 %.  Examination: General appearance: alert, cooperative, no distress and slowed mentation Head: Normocephalic, without obvious abnormality, atraumatic Eyes: EOMI  Neck: honeycomb dressing in place Lungs: clear to auscultation bilaterally Heart: regular rate and rhythm and S1, S2 normal Abdomen: normal findings: bowel sounds normal and soft, non-tender Extremities: RUE wrapped in dressing with edematous fingers noted  Skin: mobility and turgor normal Neurologic: Moves all 4 extremities, follows commands   Procedures:   11/18/20 Laminectomy/abscess evacuation  3/23 cervical epidural abscess> MSSA  11/20/2020 I& D of right shoulder by orthopedics  ETT 3/25 >>3/27  3/25 R forearm soft tissue abscess >  MSSA  Data Reviewed: I have personally reviewed following labs and imaging  studies Results for orders placed or performed during the hospital encounter of 11/18/20 (from the past 24 hour(s))  Ferritin     Status: Abnormal   Collection Time: 11/24/20  4:00 PM  Result Value Ref Range   Ferritin 1,152 (H) 24 - 336 ng/mL  Iron and TIBC     Status: Abnormal   Collection Time: 11/24/20  4:00 PM  Result Value Ref Range   Iron 29 (L) 45 - 182 ug/dL   TIBC 150 (L) 250 - 450 ug/dL   Saturation Ratios 19 17.9 - 39.5 %   UIBC 121 ug/dL  Vitamin B12     Status: Abnormal   Collection Time: 11/24/20  4:00 PM  Result Value Ref Range   Vitamin B-12 1,162 (H) 180 - 914 pg/mL  Folate     Status: None   Collection Time: 11/24/20  4:00 PM  Result Value Ref Range   Folate 15.3 >5.9 ng/mL  TSH     Status: None   Collection Time: 11/24/20  4:00 PM  Result Value Ref Range   TSH 0.957 0.350 - 4.500 uIU/mL  Glucose, capillary     Status: Abnormal   Collection Time: 11/24/20  4:44 PM  Result Value Ref Range   Glucose-Capillary 167 (H) 70 - 99 mg/dL  Glucose, capillary     Status: Abnormal   Collection Time: 11/24/20  9:26 PM  Result Value Ref Range   Glucose-Capillary 126 (H) 70 - 99 mg/dL   Comment 1 Notify RN    Comment 2 Document in Chart   Glucose, capillary     Status: Abnormal   Collection Time: 11/25/20  6:20 AM  Result Value Ref Range   Glucose-Capillary 162 (H) 70 - 99 mg/dL   Comment 1 Notify RN    Comment 2 Document in Chart   Sedimentation rate     Status: Abnormal   Collection Time: 11/25/20  6:45 AM  Result Value Ref Range   Sed Rate 116 (H) 0 - 16 mm/hr  C-reactive protein     Status: Abnormal   Collection Time: 11/25/20  6:45 AM  Result Value Ref Range   CRP 12.0 (H) <1.0 mg/dL  Basic metabolic panel     Status: Abnormal   Collection Time: 11/25/20  6:45 AM  Result Value Ref Range   Sodium 137 135 - 145 mmol/L   Potassium 3.0 (L) 3.5 - 5.1 mmol/L   Chloride 105 98 - 111 mmol/L   CO2 23 22 - 32 mmol/L   Glucose, Bld 155 (H) 70 - 99 mg/dL   BUN 14  8 - 23 mg/dL   Creatinine, Ser 0.77 0.61 - 1.24 mg/dL   Calcium 8.0 (L) 8.9 - 10.3 mg/dL   GFR, Estimated >60 >60 mL/min   Anion gap 9 5 - 15  CBC with Differential/Platelet     Status: Abnormal   Collection Time: 11/25/20  6:45 AM  Result Value Ref Range   WBC 20.7 (H) 4.0 - 10.5 K/uL   RBC 3.96 (L) 4.22 - 5.81 MIL/uL   Hemoglobin 12.2 (L) 13.0 - 17.0 g/dL   HCT 36.4 (L) 39.0 - 52.0 %   MCV 91.9 80.0 - 100.0 fL   MCH 30.8 26.0 - 34.0 pg   MCHC 33.5 30.0 - 36.0 g/dL   RDW 12.9 11.5 - 15.5 %   Platelets 538 (H) 150 - 400 K/uL   nRBC 0.0  0.0 - 0.2 %   Neutrophils Relative % 79 %   Neutro Abs 16.3 (H) 1.7 - 7.7 K/uL   Lymphocytes Relative 12 %   Lymphs Abs 2.5 0.7 - 4.0 K/uL   Monocytes Relative 7 %   Monocytes Absolute 1.5 (H) 0.1 - 1.0 K/uL   Eosinophils Relative 0 %   Eosinophils Absolute 0.1 0.0 - 0.5 K/uL   Basophils Relative 0 %   Basophils Absolute 0.1 0.0 - 0.1 K/uL   Immature Granulocytes 2 %   Abs Immature Granulocytes 0.41 (H) 0.00 - 0.07 K/uL  Magnesium     Status: None   Collection Time: 11/25/20  6:45 AM  Result Value Ref Range   Magnesium 1.8 1.7 - 2.4 mg/dL  Hepatic function panel     Status: Abnormal   Collection Time: 11/25/20  6:45 AM  Result Value Ref Range   Total Protein 5.8 (L) 6.5 - 8.1 g/dL   Albumin 1.4 (L) 3.5 - 5.0 g/dL   AST 31 15 - 41 U/L   ALT 53 (H) 0 - 44 U/L   Alkaline Phosphatase 212 (H) 38 - 126 U/L   Total Bilirubin 1.1 0.3 - 1.2 mg/dL   Bilirubin, Direct 0.1 0.0 - 0.2 mg/dL   Indirect Bilirubin 1.0 (H) 0.3 - 0.9 mg/dL  Glucose, capillary     Status: Abnormal   Collection Time: 11/25/20  8:03 AM  Result Value Ref Range   Glucose-Capillary 141 (H) 70 - 99 mg/dL  Glucose, capillary     Status: Abnormal   Collection Time: 11/25/20 12:54 PM  Result Value Ref Range   Glucose-Capillary 192 (H) 70 - 99 mg/dL    Recent Results (from the past 240 hour(s))  SARS Coronavirus 2 by RT PCR (hospital order, performed in [place] hospital  lab) Nasopharyngeal Nasopharyngeal Swab     Status: None   Collection Time: 11/18/20  4:53 PM   Specimen: Nasopharyngeal Swab  Result Value Ref Range Status   SARS Coronavirus 2 NEGATIVE NEGATIVE Final    Comment: (NOTE) SARS-CoV-2 target nucleic acids are NOT DETECTED.  The SARS-CoV-2 RNA is generally detectable in upper and lower respiratory specimens during the acute phase of infection. The lowest concentration of SARS-CoV-2 viral copies this assay can detect is 250 copies / mL. A negative result does not preclude SARS-CoV-2 infection and should not be used as the sole basis for treatment or other patient management decisions.  A negative result may occur with improper specimen collection / handling, submission of specimen other than nasopharyngeal swab, presence of viral mutation(s) within the areas targeted by this assay, and inadequate number of viral copies (<250 copies / mL). A negative result must be combined with clinical observations, patient history, and epidemiological information.  Fact Sheet for Patients:   StrictlyIdeas.no  Fact Sheet for Healthcare Providers: BankingDealers.co.za  This test is not yet approved or  cleared by the [place] FDA and has been authorized for detection and/or diagnosis of SARS-CoV-2 by FDA under an Emergency Use Authorization (EUA).  This EUA will remain in effect (meaning this test can be used) for the duration of the COVID-19 declaration under Section 564(b)(1) of the Act, 21 U.S.C. section 360bbb-3(b)(1), unless the authorization is terminated or revoked sooner.  Performed at [facility], [place] [address], [place], [place] [address]   Aerobic/Anaerobic Culture w Gram Stain (surgical/deep wound)     Status: None   Collection Time: 11/18/20  7:06 PM   Specimen: Abscess  Result Value Ref Range Status   Specimen Description ABSCESS  Final   Special Requests CERVICAL  Final    Gram Stain   Final    FEW WBC PRESENT, PREDOMINANTLY PMN ABUNDANT GRAM POSITIVE COCCI    Culture   Final    FEW STAPHYLOCOCCUS AUREUS NO ANAEROBES ISOLATED Performed at [facility], [address], [place], [place] [address]    Report Status 11/23/2020 FINAL  Final   Organism ID, Bacteria STAPHYLOCOCCUS AUREUS  Final      Susceptibility   Staphylococcus aureus - MIC*    CIPROFLOXACIN <=0.5 SENSITIVE Sensitive     ERYTHROMYCIN <=0.25 SENSITIVE Sensitive     GENTAMICIN <=0.5 SENSITIVE Sensitive     OXACILLIN <=0.25 SENSITIVE Sensitive     TETRACYCLINE <=1 SENSITIVE Sensitive     VANCOMYCIN <=0.5 SENSITIVE Sensitive     TRIMETH/SULFA <=10 SENSITIVE Sensitive     CLINDAMYCIN <=0.25 SENSITIVE Sensitive     RIFAMPIN <=0.5 SENSITIVE Sensitive     Inducible Clindamycin NEGATIVE Sensitive     * FEW STAPHYLOCOCCUS AUREUS  Culture, blood (routine x 2)     Status: None   Collection Time: 11/18/20  9:56 PM   Specimen: BLOOD  Result Value Ref Range Status   Specimen Description BLOOD SITE NOT SPECIFIED  Final   Special Requests   Final    BOTTLES DRAWN AEROBIC AND ANAEROBIC Blood Culture adequate volume   Culture   Final    NO GROWTH 5 DAYS Performed at [facility], [address] [address], [place], [place] [address]    Report Status 11/23/2020 FINAL  Final  Culture, blood (routine x 2)     Status: None   Collection Time: 11/18/20 10:03 PM   Specimen: BLOOD  Result Value Ref Range Status   Specimen Description BLOOD SITE NOT SPECIFIED  Final   Special Requests   Final    BOTTLES DRAWN AEROBIC AND ANAEROBIC Blood Culture adequate volume   Culture   Final    NO GROWTH 5 DAYS Performed at [facility], [place] [address] [place], [place] [address]    Report Status 11/23/2020 FINAL  Final  MRSA PCR Screening     Status: None   Collection Time: 11/19/20 12:46 AM   Specimen: Nasopharyngeal  Result Value Ref Range Status   MRSA by PCR NEGATIVE NEGATIVE Final    Comment:         The GeneXpert MRSA Assay (FDA approved for NASAL specimens only), is one component of a comprehensive MRSA colonization surveillance program. It is not intended to diagnose MRSA infection nor to guide or monitor treatment for MRSA infections. Performed at [facility], [place] [address] [place], [place] [address]   Aerobic/Anaerobic Culture w Gram Stain (surgical/deep wound)     Status: None   Collection Time: 11/20/20  6:53 PM   Specimen: PATH Cytology Misc. fluid; Body Fluid  Result Value Ref Range Status   Specimen Description WOUND RIGHT FOREARM  Final   Special Requests NONE  Final   Gram Stain   Final    FEW WBC PRESENT, PREDOMINANTLY PMN ABUNDANT GRAM POSITIVE COCCI    Culture   Final    ABUNDANT STAPHYLOCOCCUS AUREUS NO ANAEROBES ISOLATED Performed at [facility], [address] [place], [place] [address]    Report Status 11/25/2020 FINAL  Final   Organism ID, Bacteria STAPHYLOCOCCUS AUREUS  Final      Susceptibility   Staphylococcus aureus -  MIC*    CIPROFLOXACIN <=0.5 SENSITIVE Sensitive     ERYTHROMYCIN <=0.25 SENSITIVE Sensitive     GENTAMICIN <=0.5 SENSITIVE Sensitive     OXACILLIN 0.5 SENSITIVE Sensitive     TETRACYCLINE <=1 SENSITIVE Sensitive     VANCOMYCIN <=0.5 SENSITIVE Sensitive     TRIMETH/SULFA <=10 SENSITIVE Sensitive     CLINDAMYCIN <=0.25 SENSITIVE Sensitive     RIFAMPIN <=0.5 SENSITIVE Sensitive     Inducible Clindamycin NEGATIVE Sensitive     * ABUNDANT STAPHYLOCOCCUS AUREUS  Fungus Culture With Stain     Status: None (Preliminary result)   Collection Time: 11/20/20  6:53 PM  Result Value Ref Range Status   Fungus Stain Final report  Final    Comment: (NOTE) Performed At: [facility] [address] [place], [place] [id number] Rush Farmer MD [id number]    Fungus (Mycology) Culture PENDING  Incomplete   Fungal Source WOUND  Final    Comment: RIGHT FOREARM Performed at [facility], [place] [address]  [place], [place] [address]   Fungus Culture Result     Status: None   Collection Time: 11/20/20  6:53 PM  Result Value Ref Range Status   Result 1 Comment  Final    Comment: (NOTE) KOH/Calcofluor preparation:  no fungus observed. Performed At: [facility] [place], [place] [id number] Rush Farmer MD [id number]      Radiology Studies: US Abdomen Limited RUQ (LIVER/GB)  Result Date: 11/24/2020 CLINICAL DATA:  Abnormal liver function tests. EXAM: ULTRASOUND ABDOMEN LIMITED RIGHT UPPER QUADRANT COMPARISON:  None. FINDINGS: Gallbladder: No gallstones or wall thickening visualized. No sonographic Murphy sign noted by sonographer. Sludge is noted within gallbladder lumen. Common bile duct: Diameter: 3 mm which is within normal limits. Liver: No focal lesion identified. Within normal limits in parenchymal echogenicity. Portal vein is patent on color Doppler imaging with normal direction of blood flow towards the liver. Other: None. IMPRESSION: Gallbladder sludge. No other abnormality seen in the right upper quadrant of the abdomen. Electronically Signed   By: Marijo Conception M.D.   On: 11/24/2020 08:44   US Abdomen Limited RUQ (LIVER/GB)  Final Result    [place] EKG SITE RITE  Final Result    MR THORACIC SPINE W WO CONTRAST  Final Result    DG Abd 1 View  Final Result    DG Abd 1 View  Final Result    DG Chest Port 1 View  Final Result    CT FOREARM RIGHT W CONTRAST  Final Result    VAS [place] UPPER EXTREMITY VENOUS DUPLEX  Final Result    DG CHEST PORT 1 VIEW  Final Result      Scheduled Meds: . amLODipine  10 mg Oral Daily  . Chlorhexidine Gluconate Cloth  6 each Topical Daily  . ferrous sulfate  325 mg Oral Q breakfast  . insulin aspart  0-15 Units Subcutaneous TID WC  . insulin aspart  0-5 Units Subcutaneous QHS  . insulin detemir  10 Units Subcutaneous QHS  . insulin starter kit- pen needles  1 kit Other Once  . lisinopril  40 mg Oral Daily  . liver  oil-zinc oxide   Topical BID  . living well with diabetes book   Does not apply Once  . mouth rinse  15 mL Mouth Rinse BID  . metoprolol tartrate  50 mg Oral BID  . senna-docusate  1 tablet Oral BID  . sodium chloride flush  10-40 mL Intracatheter  Q12H  . sodium chloride flush  10-40 mL Intracatheter Q12H  . sodium chloride flush  3 mL Intravenous Q12H   PRN Meds: acetaminophen **OR** acetaminophen, bisacodyl, diazepam, hydrALAZINE, HYDROcodone-acetaminophen, labetalol, menthol-cetylpyridinium **OR** phenol, ondansetron **OR** ondansetron (ZOFRAN) IV, polyethylene glycol, sodium chloride flush, sodium chloride flush, sodium chloride flush, sodium phosphate Continuous Infusions: . sodium chloride Stopped (11/18/20 2307)  .  ceFAZolin (ANCEF) IV 2 g (11/25/20 0536)     LOS: 7 days  Time spent: Greater than 50% of the 35 minute visit was spent in counseling/coordination of care for the patient as laid out in the A&P.   Dwyane Dee, MD [facility] 11/25/2020, 1:14 PM

## 2020-11-26 ENCOUNTER — Inpatient Hospital Stay (HOSPITAL_COMMUNITY): Payer: Medicare Other

## 2020-11-26 DIAGNOSIS — G061 Intraspinal abscess and granuloma: Secondary | ICD-10-CM | POA: Diagnosis not present

## 2020-11-26 DIAGNOSIS — K5903 Drug induced constipation: Secondary | ICD-10-CM

## 2020-11-26 LAB — CBC WITH DIFFERENTIAL/PLATELET
Abs Immature Granulocytes: 0.23 10*3/uL — ABNORMAL HIGH (ref 0.00–0.07)
Basophils Absolute: 0.1 10*3/uL (ref 0.0–0.1)
Basophils Relative: 0 %
Eosinophils Absolute: 0.1 10*3/uL (ref 0.0–0.5)
Eosinophils Relative: 1 %
HCT: 35.4 % — ABNORMAL LOW (ref 39.0–52.0)
Hemoglobin: 11.9 g/dL — ABNORMAL LOW (ref 13.0–17.0)
Immature Granulocytes: 1 %
Lymphocytes Relative: 16 %
Lymphs Abs: 2.9 10*3/uL (ref 0.7–4.0)
MCH: 31.8 pg (ref 26.0–34.0)
MCHC: 33.6 g/dL (ref 30.0–36.0)
MCV: 94.7 fL (ref 80.0–100.0)
Monocytes Absolute: 1.6 10*3/uL — ABNORMAL HIGH (ref 0.1–1.0)
Monocytes Relative: 9 %
Neutro Abs: 13.8 10*3/uL — ABNORMAL HIGH (ref 1.7–7.7)
Neutrophils Relative %: 73 %
Platelets: 511 10*3/uL — ABNORMAL HIGH (ref 150–400)
RBC: 3.74 MIL/uL — ABNORMAL LOW (ref 4.22–5.81)
RDW: 12.9 % (ref 11.5–15.5)
WBC: 18.8 10*3/uL — ABNORMAL HIGH (ref 4.0–10.5)
nRBC: 0 % (ref 0.0–0.2)

## 2020-11-26 LAB — GLUCOSE, CAPILLARY
Glucose-Capillary: 174 mg/dL — ABNORMAL HIGH (ref 70–99)
Glucose-Capillary: 137 mg/dL — ABNORMAL HIGH (ref 70–99)
Glucose-Capillary: 134 mg/dL — ABNORMAL HIGH (ref 70–99)
Glucose-Capillary: 184 mg/dL — ABNORMAL HIGH (ref 70–99)

## 2020-11-26 LAB — BASIC METABOLIC PANEL
Anion gap: 6 (ref 5–15)
BUN: 14 mg/dL (ref 8–23)
CO2: 26 mmol/L (ref 22–32)
Calcium: 7.9 mg/dL — ABNORMAL LOW (ref 8.9–10.3)
Chloride: 105 mmol/L (ref 98–111)
Creatinine, Ser: 0.73 mg/dL (ref 0.61–1.24)
GFR, Estimated: >60 mL/min (ref >60)
Glucose, Bld: 157 mg/dL — ABNORMAL HIGH (ref 70–99)
Potassium: 3.9 mmol/L (ref 3.5–5.1)
Sodium: 137 mmol/L (ref 135–145)

## 2020-11-26 LAB — MAGNESIUM: Magnesium: 1.9 mg/dL (ref 1.7–2.4)

## 2020-11-26 IMAGING — DX DG ABD PORTABLE 1V
1 series · 2 of 2 positions shown · non-contrast
Comparison: None.

CLINICAL DATA: Abdominal pain, distention and constipation.

EXAM:
PORTABLE ABDOMEN - 1 VIEW

[Series 1: abdomen · 0.14mm/px · 2 of 2 slices shown]
[im 1/2]
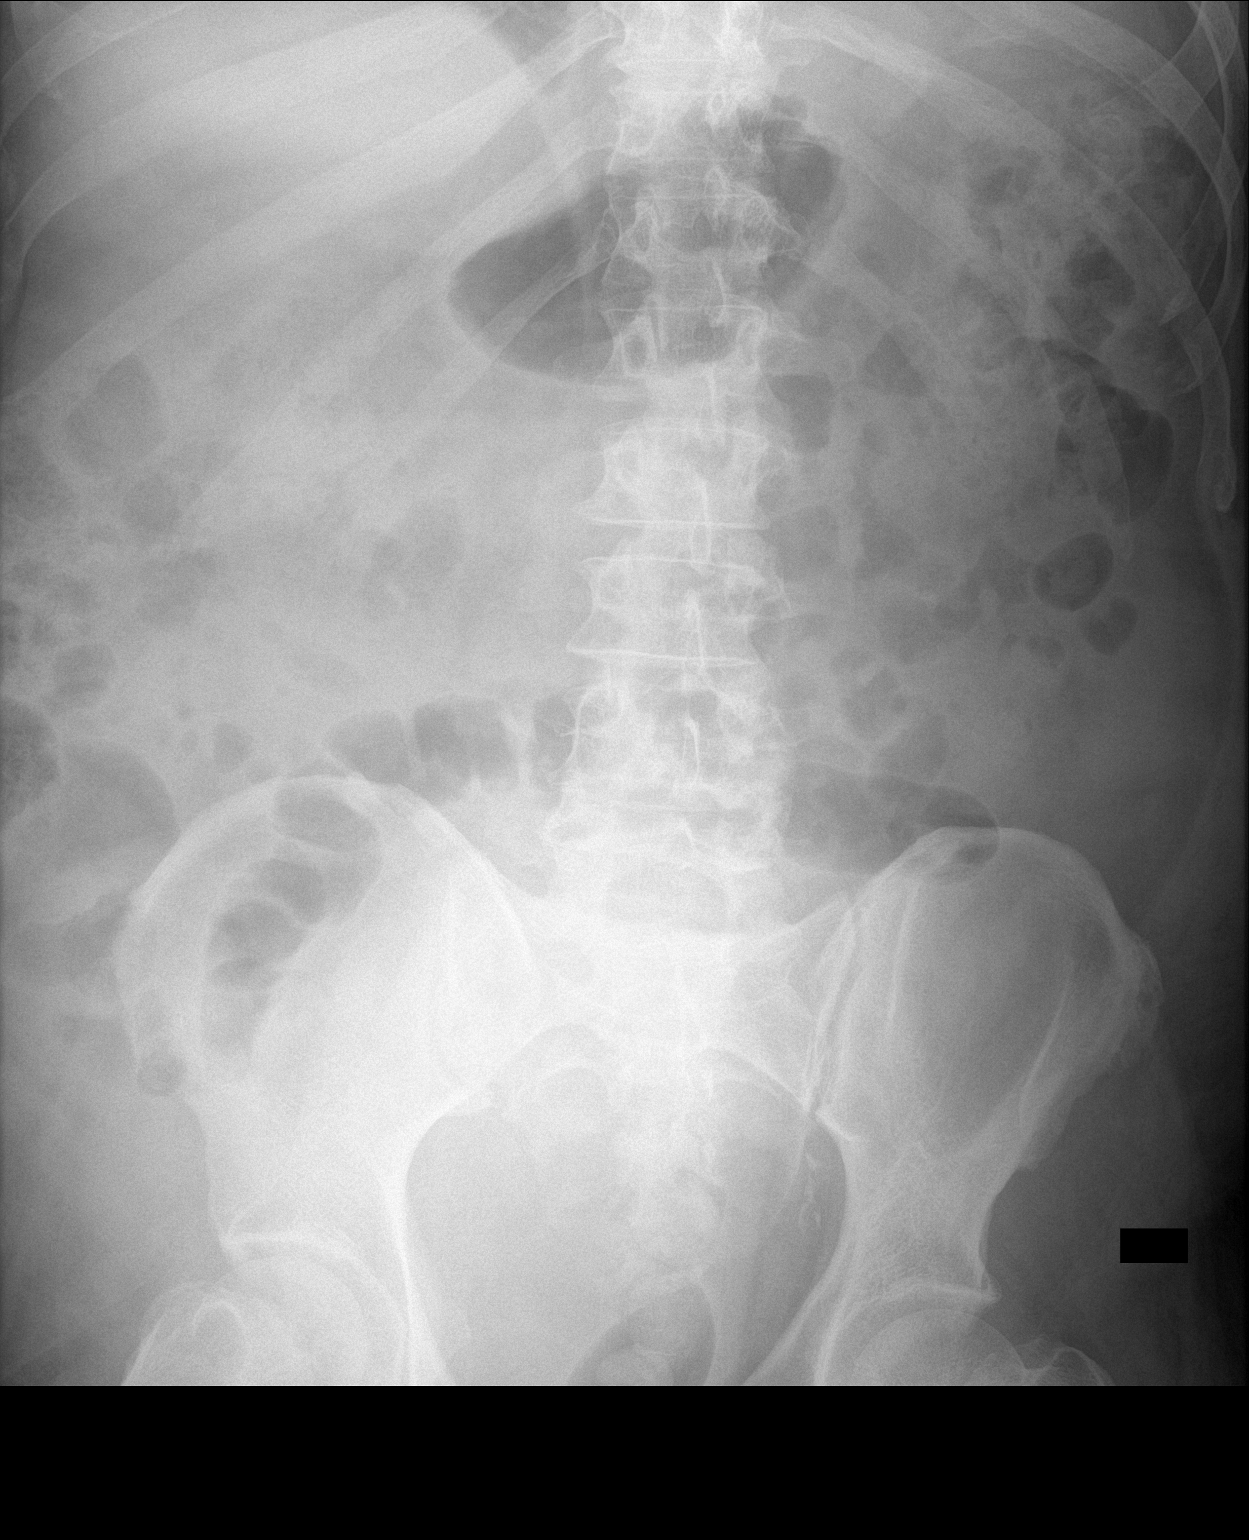
[im 2/2]
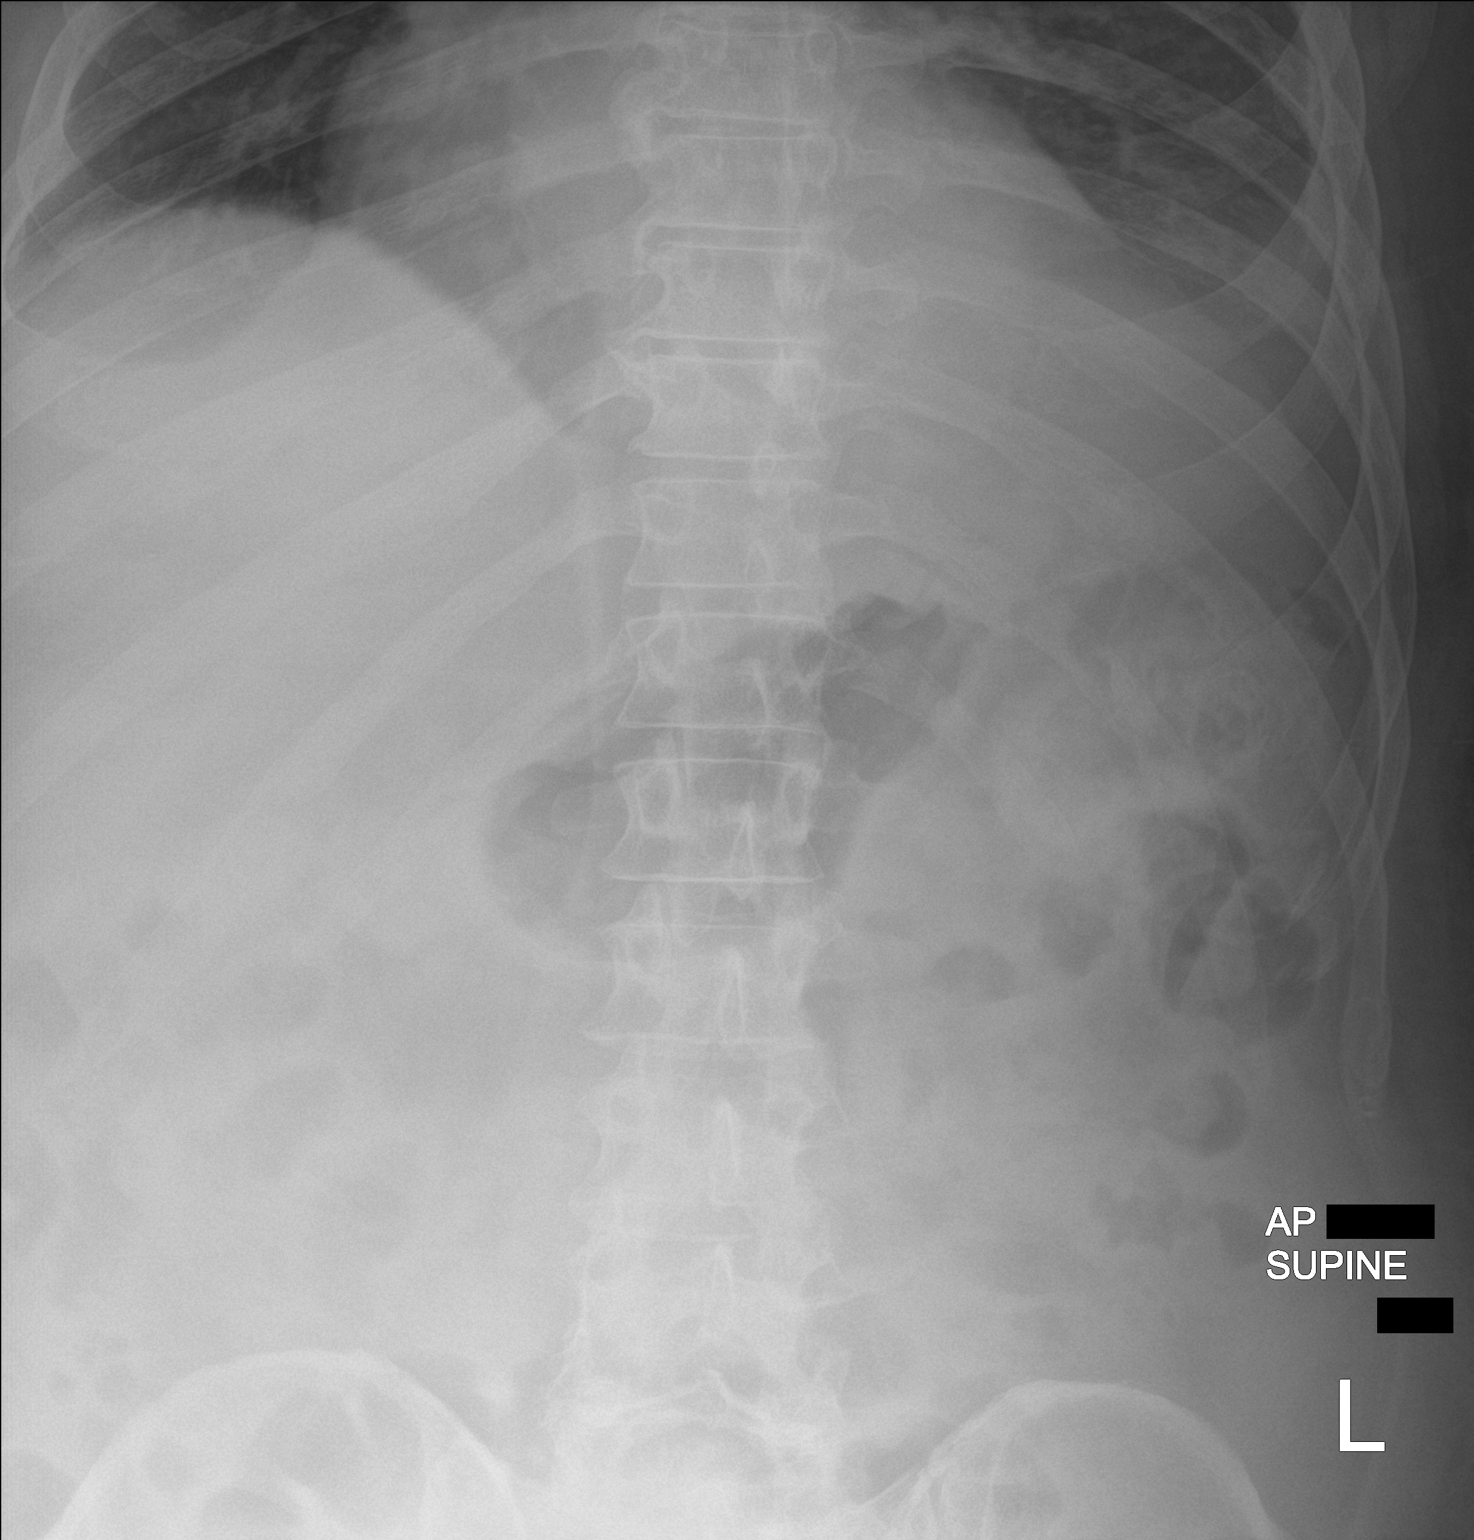

[2 of 2 positions shown; findings below may reference images not displayed]

FINDINGS: The bowel gas pattern is normal. Moderate amount of formed stool
matter in the left colon. No radio-opaque calculi or other
significant radiographic abnormality are seen.
IMPRESSION: 1. Nonobstructive bowel gas pattern.
2. Moderate amount of formed stool matter in the left colon.

## 2020-11-26 MED ORDER — SORBITOL 70 % SOLN
30.0000 mL | Freq: Once | Status: AC
  Administered 2020-11-26: 30 mL via ORAL
  Filled 2020-11-26: qty 30

## 2020-11-26 MED ORDER — HYDROCHLOROTHIAZIDE 25 MG PO TABS
25.0000 mg | ORAL_TABLET | Freq: Every day | ORAL | Status: DC
  Administered 2020-11-26 – 2020-11-28 (×3): 25 mg via ORAL
  Filled 2020-11-26 (×3): qty 1

## 2020-11-26 MED ORDER — TRAMADOL HCL 50 MG PO TABS
100.0000 mg | ORAL_TABLET | Freq: Three times a day (TID) | ORAL | Status: DC
  Administered 2020-11-26 – 2020-11-28 (×5): 100 mg via ORAL
  Filled 2020-11-26 (×5): qty 2

## 2020-11-26 MED ORDER — POLYETHYLENE GLYCOL 3350 17 G PO PACK
17.0000 g | PACK | Freq: Every day | ORAL | Status: DC
  Administered 2020-11-26 – 2020-11-28 (×3): 17 g via ORAL
  Filled 2020-11-26 (×3): qty 1
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
## 2020-11-26 NOTE — Progress Notes (Signed)
Overall situation about the same.  Still with quite a bit of neck and upper extremity pain.  Neurologically his mental status is more clear but he continues to have some residual symptoms of myelopathy with some proximal weakness in both upper extremities.  His wound is healing reasonably well.  Continue IV antibiotics for treatment of myositis, osteomyelitis/epidural abscess.  No new recommendations from my standpoint.
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
## 2020-11-26 NOTE — Plan of Care (Signed)
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
## 2020-11-26 NOTE — Progress Notes (Addendum)
Progress Note    Daniel SpitzRobert L Colao Cline   WUJ:[id number]  DOB: 12-24-1955  DOA: 11/18/2020     8  PCP: Patient, No Pcp Per (Inactive)  CC: neck pain  Hospital Course: [name] is a 65 yo male with PMH uncontrolled DMII who presented with neck pain.  He was found to have cervical spine epidural abscess and was taken for emergent laminectomy/abscess evacuation on admission.  Further work-up then revealed a right forearm abscess that was also drained by orthopedic surgery.  He was initially treated in the ICU and able to be transferred out on 11/23/2020. Infectious disease was also consulted during hospitalization.  His cervical and right forearm abscesses have grown MSSA.  He has been treated with antibiotics and is deescalated down to Ancef to complete total of 6-week course.  Interval History:  Still rather deconditioned.  He is still amenable for going to rehab but still complaining of pain/stiffness when trying to mobilize out of bed.  Does endorse that his pain is mildly improved since yesterday (started him on scheduled Tylenol and continued as needed opioid medication). He is unable to tell me when his last bowel movement was but per charting it was prior to admission and he has been hospitalized 7 days now.    ROS: Constitutional: negative for chills and fevers, Respiratory: negative for cough, Cardiovascular: negative for chest pain and Gastrointestinal: negative for abdominal pain  Assessment & Plan: Acute hypoxemic respiratory failure - s/p ICU and intubation for surgery; extubated on 3/27 -Continue aspiration precautions -Wean off oxygen as able  Epidural abscess Right forearm abscess -Growing MSSA in both cultures  - s/p "Partial C2, complete C3, C4, C5, C6, C7 laminectomy and evacuation of epidural abscess" with neurosurgery on 3/23 - s/p R forearm I&D on 3/25 -Deescalated down to Ancef per ID.  Plan is for 6-week course total.  End date 01/01/2021 -PICC line placed  11/23/20 - seen by ortho on 3/28: remove sutures in 8-10 days; follow up with [facility] in 10 days with Dion SaucierKevan McClung PA-C if outpatient for wound check/suture removal. ROM/weight bearing as tolerated; change dry dressings as needed if saturated  Physical deconditioning -seen by PT/OT - recommendations are for CIR and consult is placed - discussed case with Dr. Berline ChoughLovorn as well  - will continue to manage pain as able to help patient participate with therapy safely   Constipation  - last BM is prior to admission per charting - obtain abd xray - will start on bowel regimen and monitor   Hypertension Hypertensive urgency - resolved  -Initially required Cardene infusion in the ICU -Continue Lopressor and amlodipine -continue lisinopril (incrased to 40 mg daily) - still requiring PRN labetalol therefore add HCTZ on 11/26/20 and will also adjust as needed - may still be partly pain driven which is being addressed also   DM2 - continue SSI and CBG monitoring   Hyponatremia Hypokalemia -Replete potassium as needed  Transaminitis -Initially unclear etiology.  Possibly cholestasis of sepsis versus drug-induced -GGT elevated: Supports hepatic etiology RUQ ultrasound shows gallbladder sludge; no other abnormalities - LFTs downtrending -Follow CMP daily  Normocytic anemia -Check iron studies (mixed) and folate (15.3), B12 (1162) - start on ferrous sulfate PO   Old records reviewed in assessment of this patient  Antimicrobials: Cefepime 3/23 >> 3/24 Nafcillin 3/27 >> 3/29 Ancef 3/25>> current   DVT prophylaxis: SCD's Start: 11/18/20 2300   Code Status:   Code Status: Full Code  Objective: Blood pressure (!) 157/81,  pulse 83, temperature 98 F (36.7 C), temperature source Oral, resp. rate 18, height 5\' 6"  (1.676 m), weight 97.6 kg, SpO2 98 %.  Examination: General appearance: alert, cooperative, no distress, slowed mentation and deconditioned appearing Head: Normocephalic,  without obvious abnormality, atraumatic Eyes: EOMI  Neck: honeycomb dressing in place, some blood noted under dressing but not overtly bleeding  Lungs: clear to auscultation bilaterally Heart: regular rate and rhythm and S1, S2 normal Abdomen: obese, soft but more firm than prior; palpable mass in lower abdomen possibly a stool burden; BS present Extremities: RUE wrapped in dressing with edematous fingers noted  Skin: mobility and turgor normal Neurologic: Moves all 4 extremities, follows commands   Procedures:   11/18/20 Laminectomy/abscess evacuation  3/23 cervical epidural abscess> MSSA  11/20/2020 I& D of right shoulder by orthopedics  ETT 3/25 >>3/27  3/25 R forearm soft tissue abscess > MSSA  Data Reviewed: I have personally reviewed following labs and imaging studies Results for orders placed or performed during the hospital encounter of 11/18/20 (from the past 24 hour(s))  Glucose, capillary     Status: Abnormal   Collection Time: 11/25/20  4:24 PM  Result Value Ref Range   Glucose-Capillary 185 (H) 70 - 99 mg/dL  Glucose, capillary     Status: Abnormal   Collection Time: 11/25/20  9:08 PM  Result Value Ref Range   Glucose-Capillary 170 (H) 70 - 99 mg/dL   Comment 1 Notify RN    Comment 2 Document in Chart   Basic metabolic panel     Status: Abnormal   Collection Time: 11/26/20  4:18 AM  Result Value Ref Range   Sodium 137 135 - 145 mmol/L   Potassium 3.9 3.5 - 5.1 mmol/L   Chloride 105 98 - 111 mmol/L   CO2 26 22 - 32 mmol/L   Glucose, Bld 157 (H) 70 - 99 mg/dL   BUN 14 8 - 23 mg/dL   Creatinine, Ser 11/28/20 0.61 - 1.24 mg/dL   Calcium 7.9 (L) 8.9 - 10.3 mg/dL   GFR, Estimated 3.41 >93 mL/min   Anion gap 6 5 - 15  CBC with Differential/Platelet     Status: Abnormal   Collection Time: 11/26/20  4:18 AM  Result Value Ref Range   WBC 18.8 (H) 4.0 - 10.5 K/uL   RBC 3.74 (L) 4.22 - 5.81 MIL/uL   Hemoglobin 11.9 (L) 13.0 - 17.0 g/dL   HCT 11/28/20 (L) 02.4 - 09.7 %    MCV 94.7 80.0 - 100.0 fL   MCH 31.8 26.0 - 34.0 pg   MCHC 33.6 30.0 - 36.0 g/dL   RDW 35.3 29.9 - 24.2 %   Platelets 511 (H) 150 - 400 K/uL   nRBC 0.0 0.0 - 0.2 %   Neutrophils Relative % 73 %   Neutro Abs 13.8 (H) 1.7 - 7.7 K/uL   Lymphocytes Relative 16 %   Lymphs Abs 2.9 0.7 - 4.0 K/uL   Monocytes Relative 9 %   Monocytes Absolute 1.6 (H) 0.1 - 1.0 K/uL   Eosinophils Relative 1 %   Eosinophils Absolute 0.1 0.0 - 0.5 K/uL   Basophils Relative 0 %   Basophils Absolute 0.1 0.0 - 0.1 K/uL   Immature Granulocytes 1 %   Abs Immature Granulocytes 0.23 (H) 0.00 - 0.07 K/uL  Magnesium     Status: None   Collection Time: 11/26/20  4:18 AM  Result Value Ref Range   Magnesium 1.9 1.7 - 2.4 mg/dL  Glucose,  capillary     Status: Abnormal   Collection Time: 11/26/20  6:02 AM  Result Value Ref Range   Glucose-Capillary 174 (H) 70 - 99 mg/dL   Comment 1 Notify RN    Comment 2 Document in Chart   Glucose, capillary     Status: Abnormal   Collection Time: 11/26/20 11:34 AM  Result Value Ref Range   Glucose-Capillary 137 (H) 70 - 99 mg/dL    Recent Results (from the past 240 hour(s))  SARS Coronavirus 2 by RT PCR (hospital order, performed in [facility] hospital lab) Nasopharyngeal Nasopharyngeal Swab     Status: None   Collection Time: 11/18/20  4:53 PM   Specimen: Nasopharyngeal Swab  Result Value Ref Range Status   SARS Coronavirus 2 NEGATIVE NEGATIVE Final    Comment: (NOTE) SARS-CoV-2 target nucleic acids are NOT DETECTED.  The SARS-CoV-2 RNA is generally detectable in upper and lower respiratory specimens during the acute phase of infection. The lowest concentration of SARS-CoV-2 viral copies this assay can detect is 250 copies / mL. A negative result does not preclude SARS-CoV-2 infection and should not be used as the sole basis for treatment or other patient management decisions.  A negative result may occur with improper specimen collection / handling, submission of specimen  other than nasopharyngeal swab, presence of viral mutation(s) within the areas targeted by this assay, and inadequate number of viral copies (<250 copies / mL). A negative result must be combined with clinical observations, patient history, and epidemiological information.  Fact Sheet for Patients:   BoilerBrush.com.cy  Fact Sheet for Healthcare Providers: https://pope.com/  This test is not yet approved or  cleared by the [place] FDA and has been authorized for detection and/or diagnosis of SARS-CoV-2 by FDA under an Emergency Use Authorization (EUA).  This EUA will remain in effect (meaning this test can be used) for the duration of the COVID-19 declaration under Section 564(b)(1) of the Act, 21 U.S.C. section 360bbb-3(b)(1), unless the authorization is terminated or revoked sooner.  Performed at [facility], [address] [place], [place] [address]   Aerobic/Anaerobic Culture w Gram Stain (surgical/deep wound)     Status: None   Collection Time: 11/18/20  7:06 PM   Specimen: Abscess  Result Value Ref Range Status   Specimen Description ABSCESS  Final   Special Requests CERVICAL  Final   Gram Stain   Final    FEW WBC PRESENT, PREDOMINANTLY PMN ABUNDANT GRAM POSITIVE COCCI    Culture   Final    FEW STAPHYLOCOCCUS AUREUS NO ANAEROBES ISOLATED Performed at [facility], [address] [place], [place] [address]    Report Status 11/23/2020 FINAL  Final   Organism ID, Bacteria STAPHYLOCOCCUS AUREUS  Final      Susceptibility   Staphylococcus aureus - MIC*    CIPROFLOXACIN <=0.5 SENSITIVE Sensitive     ERYTHROMYCIN <=0.25 SENSITIVE Sensitive     GENTAMICIN <=0.5 SENSITIVE Sensitive     OXACILLIN <=0.25 SENSITIVE Sensitive     TETRACYCLINE <=1 SENSITIVE Sensitive     VANCOMYCIN <=0.5 SENSITIVE Sensitive     TRIMETH/SULFA <=10 SENSITIVE Sensitive     CLINDAMYCIN <=0.25 SENSITIVE Sensitive     RIFAMPIN  <=0.5 SENSITIVE Sensitive     Inducible Clindamycin NEGATIVE Sensitive     * FEW STAPHYLOCOCCUS AUREUS  Culture, blood (routine x 2)     Status: None   Collection Time: 11/18/20  9:56 PM   Specimen: BLOOD  Result  Value Ref Range Status   Specimen Description BLOOD SITE NOT SPECIFIED  Final   Special Requests   Final    BOTTLES DRAWN AEROBIC AND ANAEROBIC Blood Culture adequate volume   Culture   Final    NO GROWTH 5 DAYS Performed at [facility], [address] [place], [place] [address]    Report Status 11/23/2020 FINAL  Final  Culture, blood (routine x 2)     Status: None   Collection Time: 11/18/20 10:03 PM   Specimen: BLOOD  Result Value Ref Range Status   Specimen Description BLOOD SITE NOT SPECIFIED  Final   Special Requests   Final    BOTTLES DRAWN AEROBIC AND ANAEROBIC Blood Culture adequate volume   Culture   Final    NO GROWTH 5 DAYS Performed at [facility], [address] [place], [place] [address]    Report Status 11/23/2020 FINAL  Final  MRSA PCR Screening     Status: None   Collection Time: 11/19/20 12:46 AM   Specimen: Nasopharyngeal  Result Value Ref Range Status   MRSA by PCR NEGATIVE NEGATIVE Final    Comment:        The GeneXpert MRSA Assay (FDA approved for NASAL specimens only), is one component of a comprehensive MRSA colonization surveillance program. It is not intended to diagnose MRSA infection nor to guide or monitor treatment for MRSA infections. Performed at [facility], [address] [place], [place] [address]   Aerobic/Anaerobic Culture w Gram Stain (surgical/deep wound)     Status: None   Collection Time: 11/20/20  6:53 PM   Specimen: PATH Cytology Misc. fluid; Body Fluid  Result Value Ref Range Status   Specimen Description WOUND RIGHT FOREARM  Final   Special Requests NONE  Final   Gram Stain   Final    FEW WBC PRESENT, PREDOMINANTLY PMN ABUNDANT GRAM POSITIVE COCCI    Culture   Final    ABUNDANT  STAPHYLOCOCCUS AUREUS NO ANAEROBES ISOLATED Performed at [facility], [address] [place], [place] [address]    Report Status 11/25/2020 FINAL  Final   Organism ID, Bacteria STAPHYLOCOCCUS AUREUS  Final      Susceptibility   Staphylococcus aureus - MIC*    CIPROFLOXACIN <=0.5 SENSITIVE Sensitive     ERYTHROMYCIN <=0.25 SENSITIVE Sensitive     GENTAMICIN <=0.5 SENSITIVE Sensitive     OXACILLIN 0.5 SENSITIVE Sensitive     TETRACYCLINE <=1 SENSITIVE Sensitive     VANCOMYCIN <=0.5 SENSITIVE Sensitive     TRIMETH/SULFA <=10 SENSITIVE Sensitive     CLINDAMYCIN <=0.25 SENSITIVE Sensitive     RIFAMPIN <=0.5 SENSITIVE Sensitive     Inducible Clindamycin NEGATIVE Sensitive     * ABUNDANT STAPHYLOCOCCUS AUREUS  Fungus Culture With Stain     Status: None (Preliminary result)   Collection Time: 11/20/20  6:53 PM  Result Value Ref Range Status   Fungus Stain Final report  Final    Comment: (NOTE) Performed At: [facility] [address] [place], [place] [id number] Jolene Schimke MD VX:[id number]    Fungus (Mycology) Culture PENDING  Incomplete   Fungal Source WOUND  Final    Comment: RIGHT FOREARM Performed at [facility], [address], [place], [place] [address]   Fungus Culture Result     Status: None   Collection Time: 11/20/20  6:53 PM  Result Value Ref Range Status   Result 1 Comment  Final  Comment: (NOTE) KOH/Calcofluor preparation:  no fungus observed. Performed At: [facility] [address] [place], [place] [id number] Jolene Schimke MD [id number]      Radiology Studies: No results found. US Abdomen Limited RUQ (LIVER/GB)  Final Result    [place] EKG SITE RITE  Final Result    MR THORACIC SPINE W WO CONTRAST  Final Result    DG Abd 1 View  Final Result    DG Abd 1 View  Final Result    DG Chest Port 1 View  Final Result    CT FOREARM RIGHT W CONTRAST  Final Result    VAS [place] UPPER EXTREMITY VENOUS DUPLEX  Final  Result    DG CHEST PORT 1 VIEW  Final Result    DG Abd Portable 1V    (Results Pending)    Scheduled Meds: . acetaminophen  650 mg Oral Q4H  . amLODipine  10 mg Oral Daily  . Chlorhexidine Gluconate Cloth  6 each Topical Daily  . ferrous sulfate  325 mg Oral Q breakfast  . hydrochlorothiazide  25 mg Oral Daily  . insulin aspart  0-15 Units Subcutaneous TID WC  . insulin aspart  0-5 Units Subcutaneous QHS  . insulin detemir  10 Units Subcutaneous QHS  . lisinopril  40 mg Oral Daily  . liver oil-zinc oxide   Topical BID  . mouth rinse  15 mL Mouth Rinse BID  . metoprolol tartrate  50 mg Oral BID  . senna-docusate  1 tablet Oral BID  . sodium chloride flush  10-40 mL Intracatheter Q12H  . sodium chloride flush  10-40 mL Intracatheter Q12H  . sodium chloride flush  3 mL Intravenous Q12H   PRN Meds: bisacodyl, hydrALAZINE, labetalol, menthol-cetylpyridinium **OR** phenol, ondansetron **OR** ondansetron (ZOFRAN) IV, oxyCODONE, polyethylene glycol, sodium chloride flush, sodium chloride flush, sodium chloride flush, sodium phosphate Continuous Infusions: . sodium chloride Stopped (11/18/20 2307)  .  ceFAZolin (ANCEF) IV 2 g (11/26/20 1301)     LOS: 8 days  Time spent: Greater than 50% of the 35 minute visit was spent in counseling/coordination of care for the patient as laid out in the A&P.   Lewie Chamber, MD [facility] 11/26/2020, 1:08 PM

## 2020-11-26 NOTE — Progress Notes (Signed)
Inpatient Rehab Admissions Coordinator:   Met with patient at bedside again today.  He continues to be in a lot of pain and is anxious about movement increasing that pain.  Don't feel that he's quite at a point to tolerate CIR. Discussed with Dr. Dagoberto Ligas and she will complete a consult for pain and B/B recommendations today.  Will f/u with patient tomorrow.    Shann Medal, PT, DPT Admissions Coordinator [phone] 11/26/20  3:37 PM

## 2020-11-27 DIAGNOSIS — I10 Essential (primary) hypertension: Secondary | ICD-10-CM

## 2020-11-27 DIAGNOSIS — E1165 Type 2 diabetes mellitus with hyperglycemia: Secondary | ICD-10-CM

## 2020-11-27 DIAGNOSIS — K5901 Slow transit constipation: Secondary | ICD-10-CM

## 2020-11-27 DIAGNOSIS — D649 Anemia, unspecified: Secondary | ICD-10-CM

## 2020-11-27 LAB — CBC WITH DIFFERENTIAL/PLATELET
Abs Immature Granulocytes: 0.2 10*3/uL — ABNORMAL HIGH (ref 0.00–0.07)
Basophils Absolute: 0.1 10*3/uL (ref 0.0–0.1)
Basophils Relative: 0 %
Eosinophils Absolute: 0.1 10*3/uL (ref 0.0–0.5)
Eosinophils Relative: 1 %
HCT: 35.2 % — ABNORMAL LOW (ref 39.0–52.0)
Hemoglobin: 11.7 g/dL — ABNORMAL LOW (ref 13.0–17.0)
Immature Granulocytes: 1 %
Lymphocytes Relative: 13 %
Lymphs Abs: 2.7 10*3/uL (ref 0.7–4.0)
MCH: 30.9 pg (ref 26.0–34.0)
MCHC: 33.2 g/dL (ref 30.0–36.0)
MCV: 92.9 fL (ref 80.0–100.0)
Monocytes Absolute: 1.5 10*3/uL — ABNORMAL HIGH (ref 0.1–1.0)
Monocytes Relative: 7 %
Neutro Abs: 16.7 10*3/uL — ABNORMAL HIGH (ref 1.7–7.7)
Neutrophils Relative %: 78 %
Platelets: 507 10*3/uL — ABNORMAL HIGH (ref 150–400)
RBC: 3.79 MIL/uL — ABNORMAL LOW (ref 4.22–5.81)
RDW: 13.1 % (ref 11.5–15.5)
WBC: 21.3 10*3/uL — ABNORMAL HIGH (ref 4.0–10.5)
nRBC: 0 % (ref 0.0–0.2)

## 2020-11-27 LAB — BASIC METABOLIC PANEL
Anion gap: 8 (ref 5–15)
BUN: 14 mg/dL (ref 8–23)
CO2: 27 mmol/L (ref 22–32)
Calcium: 8.1 mg/dL — ABNORMAL LOW (ref 8.9–10.3)
Chloride: 103 mmol/L (ref 98–111)
Creatinine, Ser: 0.74 mg/dL (ref 0.61–1.24)
GFR, Estimated: >60 mL/min (ref >60)
Glucose, Bld: 157 mg/dL — ABNORMAL HIGH (ref 70–99)
Potassium: 3.3 mmol/L — ABNORMAL LOW (ref 3.5–5.1)
Sodium: 138 mmol/L (ref 135–145)

## 2020-11-27 LAB — GLUCOSE, CAPILLARY
Glucose-Capillary: 162 mg/dL — ABNORMAL HIGH (ref 70–99)
Glucose-Capillary: 120 mg/dL — ABNORMAL HIGH (ref 70–99)
Glucose-Capillary: 131 mg/dL — ABNORMAL HIGH (ref 70–99)
Glucose-Capillary: 127 mg/dL — ABNORMAL HIGH (ref 70–99)

## 2020-11-27 LAB — MAGNESIUM: Magnesium: 1.9 mg/dL (ref 1.7–2.4)

## 2020-11-27 MED ORDER — MAGNESIUM CITRATE PO SOLN
1.0000 | Freq: Once | ORAL | Status: AC
  Administered 2020-11-27: 1 via ORAL
  Filled 2020-11-27: qty 296

## 2020-11-27 MED ORDER — POTASSIUM CHLORIDE CRYS ER 20 MEQ PO TBCR
40.0000 meq | EXTENDED_RELEASE_TABLET | Freq: Once | ORAL | Status: AC
  Administered 2020-11-27: 40 meq via ORAL
  Filled 2020-11-27: qty 2

## 2020-11-27 MED ORDER — CEFAZOLIN IV (FOR PTA / DISCHARGE USE ONLY): 2.0000 g | Freq: Three times a day (TID) | INTRAVENOUS | 0 refills | Status: DC

## 2020-11-27 MED ORDER — ENOXAPARIN SODIUM 40 MG/0.4ML ~~LOC~~ SOLN
40.0000 mg | SUBCUTANEOUS | Status: DC
  Administered 2020-11-27 – 2020-11-28 (×2): 40 mg via SUBCUTANEOUS
  Filled 2020-11-27 (×2): qty 0.4

## 2020-11-27 NOTE — Discharge Summary (Addendum)
Physician Discharge Summary   Daniel Cline:[id number] DOB: Aug 25, 1956 DOA: 11/18/2020  PCP: Patient, No Pcp Per (Inactive)  Admit date: 11/18/2020 Discharge date:  11/28/2020  Admitted From: home Disposition:  CIR Discharging physician: Dwyane Dee, MD  Recommendations for Outpatient Follow-up:  1. Continue Ancef via PICC until 01/01/21; PICC placed 11/23/20 2. Continue DVT ppx while in rehab 3. Per ortho surgery: Remove sutures in 8-10 days postop from surgery 11/18/20; Follow up with Kevan McClung PA-C in ~10 for wound check/suture removal 4. Indwelling foley placed 11/27/20 due to urinary retention 2/2 debility; hopefully can be removed as patient regains more strength/conditioning  5. Continue laxative regimen  6. If scheduled Tylenol and Tramadol remain not-optimal would change tramadol to scheduled low dose oxycontin and watch for oversedation/worsening constipation   Patient discharged to CIR in Discharge Condition: stable Risk of unplanned readmission score: Unplanned Admission- Pilot do not use: 18.18  CODE STATUS: Full Diet recommendation:  Diet Orders (From admission, onward)    Start     Ordered   11/24/20 1122  DIET DYS 3 Room service appropriate? Yes; Fluid consistency: Thin  Diet effective now       Question Answer Comment  Room service appropriate? Yes   Fluid consistency: Thin      11/24/20 1122          Hospital Course: Mr. Peral is a 65 yo male with PMH uncontrolled DMII who presented with neck pain.  He was found to have cervical spine epidural abscess and was taken for emergent laminectomy/abscess evacuation on admission.  Further work-up then revealed a right forearm abscess that was also drained by orthopedic surgery.  He was initially treated in the ICU and able to be transferred out on 11/23/2020. Infectious disease was also consulted during hospitalization.  His cervical and right forearm abscesses have grown MSSA.  He has been treated with  antibiotics and is deescalated down to Ancef to complete total of 6-week course.  Epidural abscess Right forearm abscess -Growing MSSA in both cultures  - s/p "Partial C2, complete C3, C4, C5, C6, C7 laminectomy and evacuation of epidural abscess" with neurosurgery on 3/23 - s/p R forearm I&D on 3/25 -Deescalated down to Ancef per ID.  Plan is for 6-week course total.  End date 01/01/2021 -PICC line placed 11/23/20 - seen by ortho on 3/28: remove sutures in 8-10 days; follow up with [facility] in 10 days with Jonelle Sidle PA-C if outpatient for wound check/suture removal. ROM/weight bearing as tolerated; change dry dressings as needed if saturated -Start Lovenox for DVT prophylaxis on 11/27/2020  Acute urinary retention -Due to his significant deconditioning, he is likely retaining and this should improve with ongoing physical therapy and regaining some of his strength/mobility.  PVRs have been elevated and he is requiring intermittent straight cath; therefore, for now will place indwelling Foley catheter with hopes to remove as his strength and conditioning improves  Constipation  - last BM is prior to admission per charting -X-ray on 11/26/2020 showed large stool burden which was also palpable on his abdomen -Continue laxative regimen; has had a bowel movement overnight 3/31-4/1  Physical deconditioning -seen by PT/OT - discharge to CIR; continue pain regimen but may need modification (see above)  Acute hypoxemic respiratory failure - s/p ICU and intubation for surgery; extubated on 3/27 -Continue aspiration precautions -Wean off oxygen as able  Hypertension Hypertensive urgency - resolved  -Initially required Cardene infusion in the ICU -Continue Lopressor and amlodipine -continue lisinopril (  increased to 40 mg daily) - BP better at discharge (140s-150s / 70s-80s) - may still be partly pain driven which is being addressed also   DM2 - continue SSI and CBG monitoring    Hyponatremia Hypokalemia -Repleted  Transaminitis -Initially unclear etiology.  Possibly cholestasis of sepsis versus drug-induced -GGT elevated: Supports hepatic etiology RUQ ultrasound shows gallbladder sludge; no other abnormalities - LFTs downtrending  Normocytic anemia -Check iron studies (mixed) and folate (15.3), B12 (1162) - start on ferrous sulfate PO   Old records reviewed in assessment of this patient  Antimicrobials: Cefepime 3/23 >> 3/24 Nafcillin 3/27 >> 3/29 Ancef 3/25>> current    Principal Diagnosis: Epidural intraspinal abscess  Discharge Diagnoses: Active Hospital Problems   Diagnosis Date Noted  . Epidural intraspinal abscess 11/18/2020  . Slow transit constipation   . Normocytic anemia   . Uncontrolled type 2 diabetes mellitus with hyperglycemia ([place])   . Benign essential HTN   . HTN (hypertension) 11/24/2020  . Staph aureus infection 11/20/2020  . Pressure injury of skin 11/19/2020  . Hypertension associated with diabetes ([place])   . Abscess 11/18/2020    Resolved Hospital Problems   Diagnosis Date Noted Date Resolved  . Hypertensive urgency 11/24/2020 11/24/2020    Discharge Instructions    [facility] pharmacist to adjust dose for Vancomycin, Aminoglycosides and other anti-infective therapies as requested by physician.   Complete by: As directed    [facility] to provide Cath Flo 39m   Complete by: As directed    Administer for PICC line occlusion and as ordered by physician for other access device issues.   Anaphylaxis Kit: Provided to treat any anaphylactic reaction to the medication being provided to the patient if First Dose or when requested by physician   Complete by: As directed    Epinephrine 154mml vial / amp: Administer 0.49m69m0.49ml89mubcutaneously once for moderate to severe anaphylaxis, nurse to call physician and pharmacy when reaction occurs and call 911 if needed for immediate care    Diphenhydramine 50mg749mIV vial: Administer 25-50mg 6mM PRN for first dose reaction, rash, itching, mild reaction, nurse to call physician and pharmacy when reaction occurs   Sodium Chloride 0.9% NS 500ml I42mdminister if needed for hypovolemic blood pressure drop or as ordered by physician after call to physician with anaphylactic reaction   Change dressing on IV access line weekly and PRN   Complete by: As directed    Flush IV access with Sodium Chloride 0.9% and Heparin 10 units/ml or 100 units/ml   Complete by: As directed    Home infusion instructions - [facility]   Complete by: As directed    Instructions: Flush IV access with Sodium Chloride 0.9% and Heparin 10units/ml or 100units/ml   Change dressing on IV access line: Weekly and PRN   Instructions Cath Flo 2mg: Ad60mister for PICC Line occlusion and as ordered by physician for other access device   [facility] pharmacist to adjust dose for: Vancomycin, Aminoglycosides and other anti-infective therapies as requested by physician   Method of administration may be changed at the discretion of home infusion pharmacist based upon assessment of the patient and/or caregiver's ability to self-administer the medication ordered   Complete by: As directed      Allergies as of 11/28/2020   No Known Allergies     Medication List    TAKE these medications   ceFAZolin  IVPB Commonly known as: ANCEF Inject 2 g into the vein  every 8 (eight) hours. Indication:  MSSA C-spine epidural abscess First Dose: No Last Day of Therapy:  01/01/21 Labs - Once weekly:  CBC/D and BMP, Labs - Every other week:  ESR and CRP Method of administration: IV Push Method of administration may be changed at the discretion of home infusion pharmacist based upon assessment of the patient and/or caregiver's ability to self-administer the medication ordered.     ASK your doctor about these medications   diphenhydrAMINE 25 MG tablet Commonly  known as: BENADRYL Take 25 mg by mouth every 6 (six) hours as needed for allergies.   HYDROcodone-acetaminophen 5-325 MG tablet Commonly known as: NORCO/VICODIN Take 1 tablet by mouth every 6 (six) hours as needed.   metFORMIN 1000 MG tablet Commonly known as: GLUCOPHAGE Take 1,000 mg by mouth 2 (two) times daily. Ask about: Which instructions should I use?   metoprolol tartrate 50 MG tablet Commonly known as: LOPRESSOR Take 50 mg by mouth 2 (two) times daily.   oxyCODONE 5 MG immediate release tablet Commonly known as: Oxy IR/ROXICODONE Take 5 mg by mouth 3 (three) times daily as needed for moderate pain.            Durable Medical Equipment  (From admission, onward)         Start     Ordered   11/18/20 2300  DME 3 n 1  Once        11/18/20 2259           Discharge Care Instructions  (From admission, onward)         Start     Ordered   11/27/20 0000  Change dressing on IV access line weekly and PRN  (Home infusion instructions - [facility] )        11/27/20 1457          Follow-up Information    Comer, Okey Regal, MD Follow up on 12/22/2020.   Specialty: Infectious Diseases Why: 1:45 pm appointment. Please arrive 15 minutes early  Contact information: [address] [address] [phone]              No Known Allergies  Discharge Exam: BP 140/76 (BP Location: Right Arm)   Pulse (!) 103   Temp 98.4 F (36.9 C) (Oral)   Resp 16   Ht '5\' 6"'  (1.676 m)   Wt 97.6 kg   SpO2 95%   BMI 34.73 kg/m  General appearance: alert, cooperative, no distress, slowed mentation and deconditioned appearing Head: Normocephalic, without obvious abnormality, atraumatic Eyes: EOMI  Neck: New honeycomb dressing in place with small amount of blood underneath Lungs: clear to auscultation bilaterally Heart: regular rate and rhythm and S1, S2 normal Abdomen: Softer today with improved bowel movements and no palpable stool  burden Extremities: RUE wrapped in dressing with edematous fingers and R forearm noted  Skin: mobility and turgor normal Neurologic: Moves all 4 extremities, follows commands  The results of significant diagnostics from this hospitalization (including imaging, microbiology, ancillary and laboratory) are listed below for reference.   Microbiology: Recent Results (from the past 240 hour(s))  SARS Coronavirus 2 by RT PCR (hospital order, performed in [facility] hospital lab) Nasopharyngeal Nasopharyngeal Swab     Status: None   Collection Time: 11/18/20  4:53 PM   Specimen: Nasopharyngeal Swab  Result Value Ref Range Status   SARS Coronavirus 2 NEGATIVE NEGATIVE Final    Comment: (NOTE) SARS-CoV-2 target nucleic acids are NOT DETECTED.  The SARS-CoV-2 RNA is generally detectable in upper and lower respiratory specimens during the acute phase of infection. The lowest concentration of SARS-CoV-2 viral copies this assay can detect is 250 copies / mL. A negative result does not preclude SARS-CoV-2 infection and should not be used as the sole basis for treatment or other patient management decisions.  A negative result may occur with improper specimen collection / handling, submission of specimen other than nasopharyngeal swab, presence of viral mutation(s) within the areas targeted by this assay, and inadequate number of viral copies (<250 copies / mL). A negative result must be combined with clinical observations, patient history, and epidemiological information.  Fact Sheet for Patients:   StrictlyIdeas.no  Fact Sheet for Healthcare Providers: BankingDealers.co.za  This test is not yet approved or  cleared by the [place] FDA and has been authorized for detection and/or diagnosis of SARS-CoV-2 by FDA under an Emergency Use Authorization (EUA).  This EUA will remain in effect (meaning this test can be used) for the duration of  the COVID-19 declaration under Section 564(b)(1) of the Act, 21 U.S.C. section 360bbb-3(b)(1), unless the authorization is terminated or revoked sooner.  Performed at [facility], [place] [address], [place], [place] [address]   Aerobic/Anaerobic Culture w Gram Stain (surgical/deep wound)     Status: None   Collection Time: 11/18/20  7:06 PM   Specimen: Abscess  Result Value Ref Range Status   Specimen Description ABSCESS  Final   Special Requests CERVICAL  Final   Gram Stain   Final    FEW WBC PRESENT, PREDOMINANTLY PMN ABUNDANT GRAM POSITIVE COCCI    Culture   Final    FEW STAPHYLOCOCCUS AUREUS NO ANAEROBES ISOLATED Performed at [facility], [place] [address], [place], [place] [address]    Report Status 11/23/2020 FINAL  Final   Organism ID, Bacteria STAPHYLOCOCCUS AUREUS  Final      Susceptibility   Staphylococcus aureus - MIC*    CIPROFLOXACIN <=0.5 SENSITIVE Sensitive     ERYTHROMYCIN <=0.25 SENSITIVE Sensitive     GENTAMICIN <=0.5 SENSITIVE Sensitive     OXACILLIN <=0.25 SENSITIVE Sensitive     TETRACYCLINE <=1 SENSITIVE Sensitive     VANCOMYCIN <=0.5 SENSITIVE Sensitive     TRIMETH/SULFA <=10 SENSITIVE Sensitive     CLINDAMYCIN <=0.25 SENSITIVE Sensitive     RIFAMPIN <=0.5 SENSITIVE Sensitive     Inducible Clindamycin NEGATIVE Sensitive     * FEW STAPHYLOCOCCUS AUREUS  Culture, blood (routine x 2)     Status: None   Collection Time: 11/18/20  9:56 PM   Specimen: BLOOD  Result Value Ref Range Status   Specimen Description BLOOD SITE NOT SPECIFIED  Final   Special Requests   Final    BOTTLES DRAWN AEROBIC AND ANAEROBIC Blood Culture adequate volume   Culture   Final    NO GROWTH 5 DAYS Performed at [facility], [place] [address] [place], [place] [address]    Report Status 11/23/2020 FINAL  Final  Culture, blood (routine x 2)     Status: None   Collection Time: 11/18/20 10:03 PM   Specimen: BLOOD  Result Value Ref Range Status   Specimen  Description BLOOD SITE NOT SPECIFIED  Final   Special Requests   Final    BOTTLES DRAWN AEROBIC AND ANAEROBIC Blood Culture adequate volume   Culture   Final    NO GROWTH 5 DAYS Performed at [facility], [place]  [address] [place], [place] [address]    Report Status 11/23/2020 FINAL  Final  MRSA PCR Screening     Status: None   Collection Time: 11/19/20 12:46 AM   Specimen: Nasopharyngeal  Result Value Ref Range Status   MRSA by PCR NEGATIVE NEGATIVE Final    Comment:        The GeneXpert MRSA Assay (FDA approved for NASAL specimens only), is one component of a comprehensive MRSA colonization surveillance program. It is not intended to diagnose MRSA infection nor to guide or monitor treatment for MRSA infections. Performed at [facility], [place] [address] [place], [place] [address]   Aerobic/Anaerobic Culture w Gram Stain (surgical/deep wound)     Status: None   Collection Time: 11/20/20  6:53 PM   Specimen: PATH Cytology Misc. fluid; Body Fluid  Result Value Ref Range Status   Specimen Description WOUND RIGHT FOREARM  Final   Special Requests NONE  Final   Gram Stain   Final    FEW WBC PRESENT, PREDOMINANTLY PMN ABUNDANT GRAM POSITIVE COCCI    Culture   Final    ABUNDANT STAPHYLOCOCCUS AUREUS NO ANAEROBES ISOLATED Performed at [facility], [address] [place], [place] [address]    Report Status 11/25/2020 FINAL  Final   Organism ID, Bacteria STAPHYLOCOCCUS AUREUS  Final      Susceptibility   Staphylococcus aureus - MIC*    CIPROFLOXACIN <=0.5 SENSITIVE Sensitive     ERYTHROMYCIN <=0.25 SENSITIVE Sensitive     GENTAMICIN <=0.5 SENSITIVE Sensitive     OXACILLIN 0.5 SENSITIVE Sensitive     TETRACYCLINE <=1 SENSITIVE Sensitive     VANCOMYCIN <=0.5 SENSITIVE Sensitive     TRIMETH/SULFA <=10 SENSITIVE Sensitive     CLINDAMYCIN <=0.25 SENSITIVE Sensitive     RIFAMPIN <=0.5 SENSITIVE Sensitive     Inducible Clindamycin NEGATIVE Sensitive     *  ABUNDANT STAPHYLOCOCCUS AUREUS  Fungus Culture With Stain     Status: None (Preliminary result)   Collection Time: 11/20/20  6:53 PM  Result Value Ref Range Status   Fungus Stain Final report  Final    Comment: (NOTE) Performed At: [facility] [address] [place], [place] [id number] Rush Farmer MD YB:[id number]    Fungus (Mycology) Culture PENDING  Incomplete   Fungal Source WOUND  Final    Comment: RIGHT FOREARM Performed at [facility], [place] [address] [place], [place] [address]   Fungus Culture Result     Status: None   Collection Time: 11/20/20  6:53 PM  Result Value Ref Range Status   Result 1 Comment  Final    Comment: (NOTE) KOH/Calcofluor preparation:  no fungus observed. Performed At: [facility] [place], [place] [id number] Rush Farmer MD HR:[id number]      Labs: BNP (last 3 results) No results for input(s): BNP in the last 8760 hours. Basic Metabolic Panel: Recent Labs  Lab 11/22/20 0604 11/23/20 1000 11/24/20 0257 11/25/20 0645 11/26/20 0418 11/27/20 0500 11/28/20 0432  NA 134*   < > 137 137 137 138 133*  K 3.6   < > 3.5 3.0* 3.9 3.3* 3.2*  CL 104   < > 105 105 105 103 98  CO2 21*   < > '23 23 26 27 26  ' GLUCOSE 106*   < > 178* 155* 157* 157* 113*  BUN 25*   < > '21 14 14 14 11  ' CREATININE 0.94   < > 0.91 0.77 0.73  0.74 0.67  CALCIUM 8.1*   < > 7.8* 8.0* 7.9* 8.1* 8.1*  MG 2.0  --   --  1.8 1.9 1.9 2.0  PHOS 4.0  --   --   --   --   --   --    < > = values in this interval not displayed.   Liver Function Tests: Recent Labs  Lab 11/22/20 0604 11/23/20 1000 11/24/20 0257 11/25/20 0645  AST 68* 236* 74* 31  ALT 40 146* 95* 53*  ALKPHOS 203* 273* 232* 212*  BILITOT 0.6 2.4* 1.9* 1.1  PROT 5.8* 6.1* 5.5* 5.8*  ALBUMIN 1.3* 1.3* 1.2* 1.4*   No results for input(s): LIPASE, AMYLASE in the last 168 hours. No results for input(s): AMMONIA in the last 168 hours. CBC: Recent Labs  Lab 11/24/20 0257  11/25/20 0645 11/26/20 0418 11/27/20 0500 11/28/20 0432  WBC 22.7* 20.7* 18.8* 21.3* 20.8*  NEUTROABS  --  16.3* 13.8* 16.7* 16.2*  HGB 12.1* 12.2* 11.9* 11.7* 11.8*  HCT 36.3* 36.4* 35.4* 35.2* 35.2*  MCV 93.1 91.9 94.7 92.9 93.1  PLT 465* 538* 511* 507* 511*   Cardiac Enzymes: No results for input(s): CKTOTAL, CKMB, CKMBINDEX, TROPONINI in the last 168 hours. BNP: Invalid input(s): POCBNP CBG: Recent Labs  Lab 11/27/20 0610 11/27/20 1101 11/27/20 1648 11/27/20 2124 11/28/20 0810  GLUCAP 162* 120* 131* 127* 109*   D-Dimer No results for input(s): DDIMER in the last 72 hours. Hgb A1c No results for input(s): HGBA1C in the last 72 hours. Lipid Profile No results for input(s): CHOL, HDL, LDLCALC, TRIG, CHOLHDL, LDLDIRECT in the last 72 hours. Thyroid function studies No results for input(s): TSH, T4TOTAL, T3FREE, THYROIDAB in the last 72 hours.  Invalid input(s): FREET3 Anemia work up No results for input(s): VITAMINB12, FOLATE, FERRITIN, TIBC, IRON, RETICCTPCT in the last 72 hours. Urinalysis    Component Value Date/Time   COLORURINE YELLOW 08/20/2018 1102   APPEARANCEUR CLEAR 08/20/2018 1102   LABSPEC <1.005 (L) 08/20/2018 1102   PHURINE 7.5 08/20/2018 1102   GLUCOSEU >=500 (A) 08/20/2018 1102   HGBUR TRACE (A) 08/20/2018 1102   BILIRUBINUR NEGATIVE 08/20/2018 1102   KETONESUR 15 (A) 08/20/2018 1102   PROTEINUR NEGATIVE 08/20/2018 1102   NITRITE NEGATIVE 08/20/2018 1102   LEUKOCYTESUR NEGATIVE 08/20/2018 1102   Sepsis Labs Invalid input(s): PROCALCITONIN,  WBC,  LACTICIDVEN Microbiology Recent Results (from the past 240 hour(s))  SARS Coronavirus 2 by RT PCR (hospital order, performed in [facility] hospital lab) Nasopharyngeal Nasopharyngeal Swab     Status: None   Collection Time: 11/18/20  4:53 PM   Specimen: Nasopharyngeal Swab  Result Value Ref Range Status   SARS Coronavirus 2 NEGATIVE NEGATIVE Final    Comment: (NOTE) SARS-CoV-2 target nucleic  acids are NOT DETECTED.  The SARS-CoV-2 RNA is generally detectable in upper and lower respiratory specimens during the acute phase of infection. The lowest concentration of SARS-CoV-2 viral copies this assay can detect is 250 copies / mL. A negative result does not preclude SARS-CoV-2 infection and should not be used as the sole basis for treatment or other patient management decisions.  A negative result may occur with improper specimen collection / handling, submission of specimen other than nasopharyngeal swab, presence of viral mutation(s) within the areas targeted by this assay, and inadequate number of viral copies (<250 copies / mL). A negative result must be combined with clinical observations, patient history, and epidemiological information.  Fact Sheet for Patients:   StrictlyIdeas.no  Fact Sheet for Healthcare Providers: BankingDealers.co.za  This test is not yet approved or  cleared by the [place] FDA and has been authorized for detection and/or diagnosis of SARS-CoV-2 by FDA under an Emergency Use Authorization (EUA).  This EUA will remain in effect (meaning this test can be used) for the duration of the COVID-19 declaration under Section 564(b)(1) of the Act, 21 U.S.C. section 360bbb-3(b)(1), unless the authorization is terminated or revoked sooner.  Performed at [facility], [place] [address] [place], [place] [address]   Aerobic/Anaerobic Culture w Gram Stain (surgical/deep wound)     Status: None   Collection Time: 11/18/20  7:06 PM   Specimen: Abscess  Result Value Ref Range Status   Specimen Description ABSCESS  Final   Special Requests CERVICAL  Final   Gram Stain   Final    FEW WBC PRESENT, PREDOMINANTLY PMN ABUNDANT GRAM POSITIVE COCCI    Culture   Final    FEW STAPHYLOCOCCUS AUREUS NO ANAEROBES ISOLATED Performed at [facility], [place] [address] [place], [place] [address]    Report  Status 11/23/2020 FINAL  Final   Organism ID, Bacteria STAPHYLOCOCCUS AUREUS  Final      Susceptibility   Staphylococcus aureus - MIC*    CIPROFLOXACIN <=0.5 SENSITIVE Sensitive     ERYTHROMYCIN <=0.25 SENSITIVE Sensitive     GENTAMICIN <=0.5 SENSITIVE Sensitive     OXACILLIN <=0.25 SENSITIVE Sensitive     TETRACYCLINE <=1 SENSITIVE Sensitive     VANCOMYCIN <=0.5 SENSITIVE Sensitive     TRIMETH/SULFA <=10 SENSITIVE Sensitive     CLINDAMYCIN <=0.25 SENSITIVE Sensitive     RIFAMPIN <=0.5 SENSITIVE Sensitive     Inducible Clindamycin NEGATIVE Sensitive     * FEW STAPHYLOCOCCUS AUREUS  Culture, blood (routine x 2)     Status: None   Collection Time: 11/18/20  9:56 PM   Specimen: BLOOD  Result Value Ref Range Status   Specimen Description BLOOD SITE NOT SPECIFIED  Final   Special Requests   Final    BOTTLES DRAWN AEROBIC AND ANAEROBIC Blood Culture adequate volume   Culture   Final    NO GROWTH 5 DAYS Performed at [facility], [place] [address] [place], [place] [address]    Report Status 11/23/2020 FINAL  Final  Culture, blood (routine x 2)     Status: None   Collection Time: 11/18/20 10:03 PM   Specimen: BLOOD  Result Value Ref Range Status   Specimen Description BLOOD SITE NOT SPECIFIED  Final   Special Requests   Final    BOTTLES DRAWN AEROBIC AND ANAEROBIC Blood Culture adequate volume   Culture   Final    NO GROWTH 5 DAYS Performed at [facility], [place] [address] [place], [place] [address]    Report Status 11/23/2020 FINAL  Final  MRSA PCR Screening     Status: None   Collection Time: 11/19/20 12:46 AM   Specimen: Nasopharyngeal  Result Value Ref Range Status   MRSA by PCR NEGATIVE NEGATIVE Final    Comment:        The GeneXpert MRSA Assay (FDA approved for NASAL specimens only), is one component of a comprehensive MRSA colonization surveillance program. It is not intended to diagnose MRSA infection nor to guide or monitor treatment for MRSA  infections. Performed at [facility], [place] [address], [place], [place] [address]   Aerobic/Anaerobic Culture w Gram Stain (surgical/deep wound)  Status: None   Collection Time: 11/20/20  6:53 PM   Specimen: PATH Cytology Misc. fluid; Body Fluid  Result Value Ref Range Status   Specimen Description WOUND RIGHT FOREARM  Final   Special Requests NONE  Final   Gram Stain   Final    FEW WBC PRESENT, PREDOMINANTLY PMN ABUNDANT GRAM POSITIVE COCCI    Culture   Final    ABUNDANT STAPHYLOCOCCUS AUREUS NO ANAEROBES ISOLATED Performed at [facility], [address], [place], [place] [address]    Report Status 11/25/2020 FINAL  Final   Organism ID, Bacteria STAPHYLOCOCCUS AUREUS  Final      Susceptibility   Staphylococcus aureus - MIC*    CIPROFLOXACIN <=0.5 SENSITIVE Sensitive     ERYTHROMYCIN <=0.25 SENSITIVE Sensitive     GENTAMICIN <=0.5 SENSITIVE Sensitive     OXACILLIN 0.5 SENSITIVE Sensitive     TETRACYCLINE <=1 SENSITIVE Sensitive     VANCOMYCIN <=0.5 SENSITIVE Sensitive     TRIMETH/SULFA <=10 SENSITIVE Sensitive     CLINDAMYCIN <=0.25 SENSITIVE Sensitive     RIFAMPIN <=0.5 SENSITIVE Sensitive     Inducible Clindamycin NEGATIVE Sensitive     * ABUNDANT STAPHYLOCOCCUS AUREUS  Fungus Culture With Stain     Status: None (Preliminary result)   Collection Time: 11/20/20  6:53 PM  Result Value Ref Range Status   Fungus Stain Final report  Final    Comment: (NOTE) Performed At: [facility] [address] [place], [place] [id number] Rush Farmer MD RX:[id number]    Fungus (Mycology) Culture PENDING  Incomplete   Fungal Source WOUND  Final    Comment: RIGHT FOREARM Performed at [facility], [place] [address] [place], [place] [address]   Fungus Culture Result     Status: None   Collection Time: 11/20/20  6:53 PM  Result Value Ref Range Status   Result 1 Comment  Final    Comment: (NOTE) KOH/Calcofluor preparation:  no fungus observed. Performed  At: [facility] [place], [place] [id number] Rush Farmer MD XI:[id number]     Procedures/Studies: DG Abd 1 View  Result Date: 11/21/2020 CLINICAL DATA:  OG tube placement. EXAM: ABDOMEN - 1 VIEW COMPARISON:  Film earlier this day FINDINGS: OG tube tip is identified overlying the mid stomach. No other significant changes noted. IMPRESSION: OG tube with tip overlying the mid stomach. Electronically Signed   By: Margarette Canada M.D.   On: 11/21/2020 12:11   DG Abd 1 View  Result Date: 11/21/2020 CLINICAL DATA:  OG tube placement EXAM: ABDOMEN - 1 VIEW COMPARISON:  None. FINDINGS: Esophagogastric tube is positioned with tip below the diaphragm, side port above the gastroesophageal junction. Left pleural effusion. Gas-filled bowel in the included upper abdomen. IMPRESSION: 1. Esophagogastric tube is positioned with tip below the diaphragm, side port above the gastroesophageal junction. Recommend advancement to ensure subdiaphragmatic position. 2. Left pleural effusion. Electronically Signed   By: Eddie Candle M.D.   On: 11/21/2020 11:04   CT Cervical Spine Wo Contrast  Result Date: 11/09/2020 CLINICAL DATA:  Left-sided neck pain radiating into head EXAM: CT CERVICAL SPINE WITHOUT CONTRAST TECHNIQUE: Multidetector CT imaging of the cervical spine was performed without intravenous contrast. Multiplanar CT image reconstructions were also generated. COMPARISON:  None. FINDINGS: Alignment: There is reversal cervical lordosis with mild kyphosis centered at the C5 level. This may be positional or due to lower cervical spondylosis. Otherwise alignment is anatomic. Skull base and vertebrae: No acute fracture. No  primary bone lesion or focal pathologic process. Soft tissues and spinal canal: No prevertebral fluid or swelling. No visible canal hematoma. Disc levels: Prominent spondylosis at C5-6, C6-7, and C7-T1, with mild symmetrical neural foraminal encroachment as result of circumferential  disc osteophyte complex and uncovertebral hypertrophy. Left predominant facet hypertrophy at C2-3, C3-4, and C4-5, with mild left-sided neural foraminal encroachment. Upper chest: Airway is patent.  Lung apices are clear. Other: Reconstructed images demonstrate no additional findings. IMPRESSION: 1. No acute cervical spine fracture. 2. Prominent cervical spondylosis from C5-6 through C7-T1, with symmetrical neural foraminal encroachment. 3. Left predominant facet hypertrophy from C2-3 through C4-5, with mild left-sided neural foraminal encroachment. Electronically Signed   By: Randa Ngo M.D.   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 EXAM: MRI CERVICAL SPINE WITHOUT CONTRAST TECHNIQUE: Multiplanar, multisequence MR imaging of the cervical spine was performed. No intravenous contrast was administered. COMPARISON:  CT of the cervical spine November 09, 2020. FINDINGS: Alignment: Mild reversal of the cervical curvature. Vertebrae: No fracture, evidence of discitis, or bone lesion. Cord: Mass effect on the cord from C4 through C7. No cord signal abnormality. Posterior Fossa, vertebral arteries, paraspinal tissues: Prominent multiloculated fluid collections involving the paraspinal musculature from the level of C1 through C7, predominantly on the left with susceptibility artifact noted in the largest pocket within the left semispinalis muscle at the C2 level, likely representing air. This fluid collections communicate with a large epidural fluid collection through the interlaminar space at the C3-4 level. The epidural fluid collection extending from the level of C3 through the visualized upper thoracic spine. There is mass effect on the cord with obliteration of the CSF space at C5-6 and C6-7. Disc levels: C2-3: Small posterior disc protrusion resulting mild spinal canal stenosis. Prominent facet degenerative  changes with bilateral joint effusion resulting mild-to-moderate left neural foraminal narrowing. C3-4: Posterior disc protrusion which in association with the epidural collection results in moderate narrowing of the thecal sac. Uncovertebral and facet degenerative changes resulting in severe left neural foraminal narrowing. C4-5: Small posterior disc protrusion which in association with the epidural collection resulting in moderate narrowing of the thecal sac. Mild uncovertebral degenerative change and facet degenerative changes, more pronounced on the left, resulting in mild left neural foraminal narrowing. C5-6: Posterior disc protrusion which in association with the epidural collection resulting in moderate narrowing of the thecal sac. Uncovertebral and facet degenerative changes resulting in severe right and mild left neural foraminal narrowing. C6-7: Posterior disc protrusion which in association with the epidural collection resulting in narrowing of the thecal sac. Uncovertebral and facet degenerative changes result severe left neural foraminal narrowing. C7-T1: Epidural fluid collection resulting in moderate narrowing of the thecal sac. Uncovertebral and facet degenerative changes resulting in mild left neural foraminal narrowing. IMPRESSION: 1. Prominent multiloculated fluid collections involving the paraspinal musculature from the level of C1 through C7. This collection communicates with and extensive epidural fluid collection through the interlaminar space at the C3-4 level. The epidural collection extends from C2 through the visualized upper thoracic spine. 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 Recommend MRI of the thoracic spine without and with contrast to evaluate for disease extension. 2. There is mass effect on the cord with obliteration of the CSF space at C5-6 and C6-7. 3. Multilevel degenerative changes of the cervical spine, as described above. These results were  called by telephone at  the time of interpretation on 11/18/2020 at 4:03 pm to Dr. Earnie Larsson , who verbally acknowledged these results. Electronically Signed   By: Pedro Earls M.D.   On: 11/18/2020 16:05   MR THORACIC SPINE W WO CONTRAST  Result Date: 11/21/2020 CLINICAL DATA:  Cervical epidural abscess. Patient is status post surgical evacuation and laminectomy EXAM: MRI THORACIC WITHOUT AND WITH CONTRAST TECHNIQUE: Multiplanar and multiecho pulse sequences of the thoracic spine were obtained without and with intravenous contrast. CONTRAST:  31m GADAVIST GADOBUTROL 1 MMOL/ML IV SOLN COMPARISON:  Cervical spine MRI 11/18/2020 FINDINGS: Alignment:  Physiologic. Vertebrae: No fracture, evidence of discitis, or bone lesion. Cord: Normal signal and morphology. No thoracic epidural fluid collection. Thin epidural enhancement of the visualized lower cervical spine extending inferiorly to the T1-T2 level (series 11, image 7) without evidence of residual epidural fluid collection. Paraspinal and other soft tissues: Postsurgical changes within the posterior paraspinal soft tissues of the lower cervical spine are partially imaged at the edge of the field of view. Lower cervical posterior decompression. Simple appearing fluid collection at the laminectomy bed within the lower cervical spine seen only on sagittal sequences at the edge of the field of view measuring approximately 2.8 x 2.1 cm (series 7, image 5). There is intramuscular edema within the paraspinal musculature of the lower cervical spine. Disc levels: Shallow disc protrusion at the T9-10 level without foraminal or canal stenosis. Remaining thoracic intervertebral discs are within normal limits. There is no foraminal or canal stenosis at any level within the thoracic spine. IMPRESSION: 1. No thoracic epidural fluid collection. 2. Thin epidural enhancement of the visualized lower cervical spine extending inferiorly to the T1-T2 level without  evidence of residual epidural fluid collection. Findings are likely secondary to known infection and recent surgery. 3. Postsurgical changes within the posterior paraspinal soft tissues of the lower cervical spine are partially imaged at the edge of the field of view. Simple appearing fluid collection at the laminectomy bed within the lower cervical spine seen only on sagittal sequences at the edge of the field of view measuring approximately 2.8 x 2.1 cm. 4. Intramuscular edema within the paraspinal musculature of the lower cervical spine, which may reflect postsurgical change and/or myositis. Electronically Signed   By: NDavina PokeD.O.   On: 11/21/2020 16:31   CT FOREARM RIGHT W CONTRAST  Result Date: 11/20/2020 CLINICAL DATA:  Right forearm pain.  Fever.  Infection suspected EXAM: CT OF THE UPPER RIGHT EXTREMITY WITH CONTRAST TECHNIQUE: Multidetector CT imaging of the upper right extremity was performed according to the standard protocol following intravenous contrast administration. CONTRAST:  1084mOMNIPAQUE IOHEXOL 300 MG/ML  SOLN COMPARISON:  None. FINDINGS: Bones/Joint/Cartilage No acute fracture. No dislocation. No bony erosion or periosteal elevation. Mild-to-moderate arthropathy of the right elbow with subchondral cystic changes in the capitellum. No appreciable elbow joint effusion. Well-circumscribed cystic structure within the triquetrum, likely an intraosseous ganglion. No definite radiocarpal joint effusion. Ligaments Suboptimally assessed by CT. Muscles and Tendons Elongated area of relative decreased attenuation within the flexor compartment musculature of the proximal forearm, which appears predominantly located within the brachioradialis muscle and possibly a portion of the extensor carpi radialis longus muscle measuring approximately 3.2 x 1.2 x 14.0 cm (see series 6, images 1-83). No well-defined enhancing rim. Remaining musculotendinous structures appear grossly intact. No  appreciable tenosynovial fluid collections at the level of the wrist. Soft tissues Circumferential subcutaneous edema. No fluid collections are evident within the subcutaneous soft tissues. No  soft tissue gas. IMPRESSION: 1. Elongated area of relative decreased attenuation within the flexor compartment musculature of the proximal to mid forearm, which appears predominantly located within the brachioradialis muscle and possibly a portion of the extensor carpi radialis longus muscle measuring approximately 3.2 x 1.2 x 14.0 cm. No well-defined enhancing rim. Findings are nonspecific and could reflect myositis and possibly developing intramuscular phlegmon or abscess. Consider further evaluation with ultrasound or MRI to assess for a fluid component. 2. Circumferential subcutaneous edema, which may represent cellulitis. No fluid collections within the subcutaneous soft tissues. No soft tissue gas. 3. No acute osseous abnormality. Electronically Signed   By: Davina Poke D.O.   On: 11/20/2020 11:49   DG Chest Port 1 View  Result Date: 11/20/2020 CLINICAL DATA:  Status post central line placement EXAM: PORTABLE CHEST 1 VIEW COMPARISON:  11/19/2020 FINDINGS: Left jugular central line is noted with catheter tip at the proximal superior vena cava. No pneumothorax is noted. Endotracheal tube is seen in satisfactory position. Cardiac shadow is prominent but stable. Lungs are hypoinflated with increasing opacity in the left base. No bony abnormality is seen. IMPRESSION: Tubes and lines as described. Hypoinflation with increasing left basilar opacity. Electronically Signed   By: Inez Catalina M.D.   On: 11/20/2020 20:42   DG CHEST PORT 1 VIEW  Result Date: 11/19/2020 CLINICAL DATA:  Hypoxia EXAM: PORTABLE CHEST 1 VIEW COMPARISON:  None. FINDINGS: Lung volumes are low. Some streaky opacities favor atelectasis. More dense airspace opacities noted in the retrocardiac space. No visible pneumothorax. Suspect a small right  pleural effusion. No visible left effusion. Cardiac silhouette is enlarged though may be accentuated by portable technique. Telemetry leads overlie the chest. No acute osseous or soft tissue abnormality. Degenerative changes are present in the imaged spine and shoulders. Dextrocurvature of the spine. IMPRESSION: 1. Low lung volumes with bibasilar atelectasis 2. More dense airspace opacity in the retrocardiac space, could reflect pneumonia or further volume loss. 3. Small right pleural effusion. 4. Suspect cardiomegaly though may be accentuated by portable technique. Electronically Signed   By: Lovena Le M.D.   On: 11/19/2020 01:15   DG Abd Portable 1V  Result Date: 11/26/2020 CLINICAL DATA:  Abdominal pain, distention and constipation. EXAM: PORTABLE ABDOMEN - 1 VIEW COMPARISON:  None. FINDINGS: The bowel gas pattern is normal. Moderate amount of formed stool matter in the left colon. No radio-opaque calculi or other significant radiographic abnormality are seen. IMPRESSION: 1. Nonobstructive bowel gas pattern. 2. Moderate amount of formed stool matter in the left colon. Electronically Signed   By: Fidela Salisbury M.D.   On: 11/26/2020 14:51   ECHOCARDIOGRAM COMPLETE  Result Date: 11/20/2020    ECHOCARDIOGRAM REPORT   Patient Name:   RISHITH SIDDOWAY III Date of Exam: 11/20/2020 Medical Rec #:  [id number]          Height:       66.0 in Accession #:    [id number]         Weight:       185.0 lb Date of Birth:  Jul 03, 1956         BSA:          1.935 m Patient Age:    65 years           BP:           167/60 mmHg Patient Gender: M                  HR:  93 bpm. Exam Location:  Inpatient Procedure: 2D Echo, Intracardiac Opacification Agent, Color Doppler and Cardiac            Doppler Indications:    AI  History:        Patient has no prior history of Echocardiogram examinations.                 Risk Factors:Diabetes.  Sonographer:    Luisa Hart RDCS Referring Phys: [place]  Comments: Suboptimal apical window. IMPRESSIONS  1. Left ventricular ejection fraction, by estimation, is 35 to 40%. The left ventricle has moderately decreased function. The left ventricle demonstrates global hypokinesis. There is mild concentric left ventricular hypertrophy. Left ventricular diastolic parameters are consistent with Grade II diastolic dysfunction (pseudonormalization). Elevated left atrial pressure.  2. Right ventricular systolic function is normal. The right ventricular size is normal.  3. The mitral valve is normal in structure. Mild mitral valve regurgitation. No evidence of mitral stenosis.  4. The aortic valve is normal in structure. Aortic valve regurgitation is present, unable to assess severity due to poor technical quality of the study. Suspect mild regurgitation. No aortic stenosis is present.  5. The inferior vena cava is normal in size with greater than 50% respiratory variability, suggesting right atrial pressure of 3 mmHg. Conclusion(s)/Recommendation(s): No evidence of valvular vegetations on this transthoracic echocardiogram. Unable to reliably assess the degree of aortic insufficiency, but it does not appear to be severe. Would recommend a transesophageal echocardiogram  to exclude infective endocarditis if clinically indicated. However, this will need to be performed as a delayed study following recent cervical spine surgery. FINDINGS  Left Ventricle: Left ventricular ejection fraction, by estimation, is 35 to 40%. The left ventricle has moderately decreased function. The left ventricle demonstrates global hypokinesis. Definity contrast agent was given IV to delineate the left ventricular endocardial borders. The left ventricular internal cavity size was normal in size. There is mild concentric left ventricular hypertrophy. Left ventricular diastolic parameters are consistent with Grade II diastolic dysfunction (pseudonormalization). Elevated left atrial pressure. Right Ventricle:  The right ventricular size is normal. No increase in right ventricular wall thickness. Right ventricular systolic function is normal. Left Atrium: Left atrial size was normal in size. Right Atrium: Right atrial size was normal in size. Pericardium: There is no evidence of pericardial effusion. Mitral Valve: The mitral valve is normal in structure. Mild mitral valve regurgitation. No evidence of mitral valve stenosis. MV peak gradient, 5.1 mmHg. The mean mitral valve gradient is 2.0 mmHg. Tricuspid Valve: The tricuspid valve is normal in structure. Tricuspid valve regurgitation is not demonstrated. No evidence of tricuspid stenosis. Aortic Valve: The aortic valve is normal in structure. Aortic valve regurgitation is present, unable to assess severity due to poor technical quality of the study. Suspect mild regurgitation. No aortic stenosis is present. Aortic valve mean gradient measures 2.0 mmHg. Aortic valve peak gradient measures 3.5 mmHg. Aortic valve area, by VTI measures 2.50 cm. Pulmonic Valve: The pulmonic valve was normal in structure. Pulmonic valve regurgitation is not visualized. No evidence of pulmonic stenosis. Aorta: The aortic root is normal in size and structure. Venous: The inferior vena cava is normal in size with greater than 50% respiratory variability, suggesting right atrial pressure of 3 mmHg. IAS/Shunts: No atrial level shunt detected by color flow Doppler.  LEFT VENTRICLE PLAX 2D LVIDd:         4.80 cm  Diastology LVIDs:  4.10 cm  LV e' medial:    5.77 cm/s LV PW:         1.30 cm  LV E/e' medial:  18.0 LV IVS:        1.10 cm  LV e' lateral:   5.43 cm/s LVOT diam:     2.00 cm  LV E/e' lateral: 19.2 LV SV:         35 LV SV Index:   18 LVOT Area:     3.14 cm  RIGHT VENTRICLE RV S prime:     11.50 cm/s TAPSE (M-mode): 1.4 cm LEFT ATRIUM             Index       RIGHT ATRIUM           Index LA diam:        3.40 cm 1.76 cm/m  RA Area:     11.50 cm LA Vol (A2C):   34.1 ml 17.63 ml/m RA  Volume:   23.20 ml  11.99 ml/m LA Vol (A4C):   45.5 ml 23.52 ml/m LA Biplane Vol: 39.5 ml 20.42 ml/m  AORTIC VALVE                   PULMONIC VALVE AV Area (Vmax):    2.54 cm    PV Vmax:       0.79 m/s AV Area (Vmean):   2.70 cm    PV Vmean:      68.600 cm/s AV Area (VTI):     2.50 cm    PV VTI:        0.171 m AV Vmax:           92.90 cm/s  PV Peak grad:  2.5 mmHg AV Vmean:          61.600 cm/s PV Mean grad:  2.0 mmHg AV VTI:            0.142 m AV Peak Grad:      3.5 mmHg AV Mean Grad:      2.0 mmHg LVOT Vmax:         75.10 cm/s LVOT Vmean:        53.000 cm/s LVOT VTI:          0.113 m LVOT/AV VTI ratio: 0.80  AORTA Ao Root diam: 3.40 cm Ao Asc diam:  3.30 cm MITRAL VALVE MV Area (PHT): 6.12 cm     SHUNTS MV Area VTI:   1.61 cm     Systemic VTI:  0.11 m MV Peak grad:  5.1 mmHg     Systemic Diam: 2.00 cm MV Mean grad:  2.0 mmHg MV Vmax:       1.13 m/s MV Vmean:      62.5 cm/s MV Decel Time: 124 msec MR Peak grad: 89.5 mmHg MR Mean grad: 55.0 mmHg MR Vmax:      473.00 cm/s MR Vmean:     345.0 cm/s MV E velocity: 104.00 cm/s MV A velocity: 61.70 cm/s MV E/A ratio:  1.69 Mihai Croitoru MD Electronically signed by Sanda Klein MD Signature Date/Time: 11/20/2020/4:36:40 PM    Final    VAS [place] UPPER EXTREMITY VENOUS DUPLEX  Result Date: 11/19/2020 UPPER VENOUS STUDY  Indications: Pain, Swelling, and Erythema Risk Factors: None identified. Limitations: Poor ultrasound/tissue interface and patient positioning, patient pain tolerance. Comparison Study: No prior studies. Performing Technologist: Oliver Hum RVT  Examination Guidelines: A complete evaluation includes B-mode imaging, spectral Doppler, color Doppler, and power Doppler  as needed of all accessible portions of each vessel. Bilateral testing is considered an integral part of a complete examination. Limited examinations for reoccurring indications may be performed as noted.  Right Findings:  +----------+------------+---------+-----------+----------+-------+ RIGHT     CompressiblePhasicitySpontaneousPropertiesSummary +----------+------------+---------+-----------+----------+-------+ IJV           Full       Yes       Yes                      +----------+------------+---------+-----------+----------+-------+ Subclavian    Full       Yes       Yes                      +----------+------------+---------+-----------+----------+-------+ Axillary      Full       Yes       Yes                      +----------+------------+---------+-----------+----------+-------+ Brachial      Full       Yes       Yes                      +----------+------------+---------+-----------+----------+-------+ Radial        Full                                          +----------+------------+---------+-----------+----------+-------+ Ulnar         Full                                          +----------+------------+---------+-----------+----------+-------+ Cephalic      Full                                          +----------+------------+---------+-----------+----------+-------+ Basilic       Full                                          +----------+------------+---------+-----------+----------+-------+  Left Findings: +----------+------------+---------+-----------+----------+-------+ LEFT      CompressiblePhasicitySpontaneousPropertiesSummary +----------+------------+---------+-----------+----------+-------+ Subclavian    Full       Yes       Yes                      +----------+------------+---------+-----------+----------+-------+  Summary:  Right: No evidence of deep vein thrombosis in the upper extremity. No evidence of superficial vein thrombosis in the upper extremity.  Left: No evidence of thrombosis in the subclavian.  *See table(s) above for measurements and observations.  Diagnosing physician: Monica Martinez MD Electronically signed by  Monica Martinez MD on 11/19/2020 at 1:05:37 PM.    Final    [place] EKG SITE RITE  Result Date: 11/23/2020 If Site Rite image not attached, placement could not be confirmed due to current cardiac rhythm.  US Abdomen Limited RUQ (LIVER/GB)  Result Date: 11/24/2020 CLINICAL DATA:  Abnormal liver function tests. EXAM: ULTRASOUND ABDOMEN LIMITED RIGHT UPPER QUADRANT COMPARISON:  None. FINDINGS: Gallbladder: No gallstones or wall thickening visualized. No sonographic Percell Miller  sign noted by sonographer. Sludge is noted within gallbladder lumen. Common bile duct: Diameter: 3 mm which is within normal limits. Liver: No focal lesion identified. Within normal limits in parenchymal echogenicity. Portal vein is patent on color Doppler imaging with normal direction of blood flow towards the liver. Other: None. IMPRESSION: Gallbladder sludge. No other abnormality seen in the right upper quadrant of the abdomen. Electronically Signed   By: Marijo Conception M.D.   On: 11/24/2020 08:44     Time coordinating discharge: Over 30 minutes    Dwyane Dee, MD  [facility] 11/28/2020, 9:15 AM

## 2020-11-27 NOTE — Progress Notes (Signed)
   Providing Compassionate, Quality Care - Together   Subjective: Patient reports neck pain. No new complaints.  Objective: Vital signs in last 24 hours: Temp:  [97.8 F (36.6 C)-98.2 F (36.8 C)] 97.9 F (36.6 C) (04/01 0811) Pulse Rate:  [83-102] 99 (04/01 0811) Resp:  [18-20] 18 (04/01 0811) BP: (124-187)/(69-92) 177/70 (04/01 0811) SpO2:  [95 %-99 %] 95 % (04/01 0811)  Intake/Output from previous day: 03/31 0701 - 04/01 0700 In: -  Out: 2901 [Urine:2901] Intake/Output this shift: No intake/output data recorded.  Alert and oriented to self PERRLA CN II-XII grossly intact Speech clear MAE,Strength slightly decreased in BUE, sensation intact RUE edematous, wrapped with ACE bandage Incision is covered with Steri Strips; Site is dry and intact  Lab Results: Recent Labs    11/26/20 0418 11/27/20 0500  WBC 18.8* 21.3*  HGB 11.9* 11.7*  HCT 35.4* 35.2*  PLT 511* 507*   BMET Recent Labs    11/26/20 0418 11/27/20 0500  NA 137 138  K 3.9 3.3*  CL 105 103  CO2 26 27  GLUCOSE 157* 157*  BUN 14 14  CREATININE 0.73 0.74  CALCIUM 7.9* 8.1*    Studies/Results: DG Abd Portable 1V  Result Date: 11/26/2020 CLINICAL DATA:  Abdominal pain, distention and constipation. EXAM: PORTABLE ABDOMEN - 1 VIEW COMPARISON:  None. FINDINGS: The bowel gas pattern is normal. Moderate amount of formed stool matter in the left colon. No radio-opaque calculi or other significant radiographic abnormality are seen. IMPRESSION: 1. Nonobstructive bowel gas pattern. 2. Moderate amount of formed stool matter in the left colon. Electronically Signed   By: Ted Mcalpine M.D.   On: 11/26/2020 14:51    Assessment/Plan: Mr. Helfman presented to the [facility] office on 11/18/2020 appearing acutely ill, with severe neck pain. A stat cervical MRI was completed, showinga cervicalepidural abscess. He underwent emergent partial C2andcomplete C3, C4, C5, C6, C7 laminectomy withevacuation of epidural  abscess on 11/18/2020 by Dr. Jordan Likes. Wound cultures growing gram positive cocci(MSSA). No growth in blood cultures. Mr. Folks has poorly managed type 2 diabetes, with a hemoglobin A1C of 13.9. He transitioned off the insulin dripon 3/22/2022and is now on SSI. He became hypertensive on the evening of 11/17/2020, not responding to p.r.n. antihypertensives. He was started on a Cardene drip.He has since transitioned to PO antihypertensives.He developed acute edema and erythema in his right upper extremityon 11/19/2020. A venous ultrasound was performed and DVTwas ruled out. CT scan revealed an intramuscular abscess. Dr. Aundria Rud of orthopedics performed an incision and drainage of the patient's right forearm on 11/20/2020. Patient was left intubated following surgery and extubated on 11/22/2020. Thoracic MRI on 11/21/2020 did not show any epidural abcess. Switched to nafcillin on 11/22/2020. Changed to Ancef on 11/24/2020. Will require 6 weeks of abx. Plan is for discharge to CIR.   LOS: 9 days     Val Eagle, DNP, AGNP-C Nurse Practitioner  [facility] [address], [place], [place] [address] P: [phone]    F: [phone]  11/27/2020, 9:54 AM
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
## 2020-11-27 NOTE — Discharge Summary (Incomplete Revision)
Physician Discharge Summary   Daniel Cline WUJ:[id number] DOB: 1955/10/19 DOA: 11/18/2020  PCP: Patient, No Pcp Per (Inactive)  Admit date: 11/18/2020 Discharge date:  11/27/2020  Admitted From: home Disposition:  CIR Discharging physician: Daniel Dee, MD  Recommendations for Outpatient Follow-up:  1. Continue Ancef via PICC until 01/01/21; PICC placed 11/23/20 2. Continue DVT ppx while in rehab 3. Per ortho surgery: Remove sutures in 8-10 days postop from surgery 11/18/20; Follow up with Daniel McClung PA-C in ~10 for wound check/suture removal 4. Indwelling foley placed 11/27/20 due to urinary retention 2/2 debility; hopefully can be removed as patient regains more strength/conditioning  5. Continue laxative regimen    Patient discharged to CIR in Discharge Condition: stable Risk of unplanned readmission score: Unplanned Admission- Pilot do not use: 16.44  CODE STATUS: Full Diet recommendation:  Diet Orders (From admission, onward)    Start     Ordered   11/24/20 1122  DIET DYS 3 Room service appropriate? Yes; Fluid consistency: Thin  Diet effective now       Question Answer Comment  Room service appropriate? Yes   Fluid consistency: Thin      11/24/20 1122          Hospital Course: Daniel Cline is a 65 yo male with PMH uncontrolled DMII who presented with neck pain.  He was found to have cervical spine epidural abscess and was taken for emergent laminectomy/abscess evacuation on admission.  Further work-up then revealed a right forearm abscess that was also drained by orthopedic surgery.  He was initially treated in the ICU and able to be transferred out on 11/23/2020. Infectious disease was also consulted during hospitalization.  His cervical and right forearm abscesses have grown MSSA.  He has been treated with antibiotics and is deescalated down to Ancef to complete total of 6-week course.  Epidural abscess Right forearm abscess -Growing MSSA in both cultures  - s/p  "Partial C2, complete C3, C4, C5, C6, C7 laminectomy and evacuation of epidural abscess" with neurosurgery on 3/23 - s/p R forearm I&D on 3/25 -Deescalated down to Ancef per ID.  Plan is for 6-week course total.  End date 01/01/2021 -PICC line placed 11/23/20 - seen by ortho on 3/28: remove sutures in 8-10 days; follow up with [facility] in 10 days with Daniel Sidle PA-C if outpatient for wound check/suture removal. ROM/weight bearing as tolerated; change dry dressings as needed if saturated -Start Lovenox for DVT prophylaxis on 11/27/2020  Acute urinary retention -Due to his significant deconditioning, he is likely retaining and this should improve with ongoing physical therapy and regaining some of his strength/mobility.  PVRs have been elevated and he is requiring intermittent straight cath; therefore, for now will place indwelling Foley catheter with hopes to remove as his strength and conditioning improves  Constipation  - last BM is prior to admission per charting -X-ray on 11/26/2020 showed large stool burden which was also palpable on his abdomen -Continue laxative regimen; has had a bowel movement overnight 3/31-4/1  Physical deconditioning -seen by PT/OT - recommendations are for CIR and admission is pending further pain control - discussed case with Dr. Dagoberto Cline as well  - will continue to manage pain as able to help patient participate with therapy safely   Acute hypoxemic respiratory failure - s/p ICU and intubation for surgery; extubated on 3/27 -Continue aspiration precautions -Wean off oxygen as able  Hypertension Hypertensive urgency - resolved  -Initially required Cardene infusion in the ICU -Continue Lopressor and  amlodipine -continue lisinopril (increased to 40 mg daily) - still requiring PRN labetalol therefore add HCTZ on 11/26/20 and will also adjust as needed - may still be partly pain driven which is being addressed also   DM2 - continue SSI and CBG  monitoring   Hyponatremia Hypokalemia -Replete potassium as needed  Transaminitis -Initially unclear etiology.  Possibly cholestasis of sepsis versus drug-induced -GGT elevated: Supports hepatic etiology RUQ ultrasound shows gallbladder sludge; no other abnormalities - LFTs downtrending  Normocytic anemia -Check iron studies (mixed) and folate (15.3), B12 (1162) - start on ferrous sulfate PO   Old records reviewed in assessment of this patient  Antimicrobials: Cefepime 3/23 >> 3/24 Nafcillin 3/27 >> 3/29 Ancef 3/25>> current    Principal Diagnosis: Epidural intraspinal abscess  Discharge Diagnoses: Active Hospital Problems   Diagnosis Date Noted  . Epidural intraspinal abscess 11/18/2020  . Slow transit constipation   . Normocytic anemia   . Uncontrolled type 2 diabetes mellitus with hyperglycemia ([place])   . Benign essential HTN   . HTN (hypertension) 11/24/2020  . Staph aureus infection 11/20/2020  . Pressure injury of skin 11/19/2020  . Hypertension associated with diabetes ([place])   . Abscess 11/18/2020    Resolved Hospital Problems   Diagnosis Date Noted Date Resolved  . Hypertensive urgency 11/24/2020 11/24/2020    Discharge Instructions    [facility] pharmacist to adjust dose for Vancomycin, Aminoglycosides and other anti-infective therapies as requested by physician.   Complete by: As directed    [facility] to provide Cath Flo 213m   Complete by: As directed    Administer for PICC line occlusion and as ordered by physician for other access device issues.   Anaphylaxis Kit: Provided to treat any anaphylactic reaction to the medication being provided to the patient if First Dose or when requested by physician   Complete by: As directed    Epinephrine 12mml vial / amp: Administer 0.13m61m0.13ml713mubcutaneously once for moderate to severe anaphylaxis, nurse to call physician and pharmacy when reaction occurs and call 911 if needed for  immediate care   Diphenhydramine 50mg39mIV vial: Administer 25-50mg 44mM PRN for first dose reaction, rash, itching, mild reaction, nurse to call physician and pharmacy when reaction occurs   Sodium Chloride 0.9% NS 500ml I45mdminister if needed for hypovolemic blood pressure drop or as ordered by physician after call to physician with anaphylactic reaction   Change dressing on IV access line weekly and PRN   Complete by: As directed    Flush IV access with Sodium Chloride 0.9% and Heparin 10 units/ml or 100 units/ml   Complete by: As directed    Home infusion instructions - [facility]   Complete by: As directed    Instructions: Flush IV access with Sodium Chloride 0.9% and Heparin 10units/ml or 100units/ml   Change dressing on IV access line: Weekly and PRN   Instructions Cath Flo 2mg: Ad59mister for PICC Line occlusion and as ordered by physician for other access device   [facility] pharmacist to adjust dose for: Vancomycin, Aminoglycosides and other anti-infective therapies as requested by physician   Method of administration may be changed at the discretion of home infusion pharmacist based upon assessment of the patient and/or caregiver's ability to self-administer the medication ordered   Complete by: As directed      Allergies as of 11/27/2020   No Known Allergies     Medication List    TAKE these medications  ceFAZolin  IVPB Commonly known as: ANCEF Inject 2 g into the vein every 8 (eight) hours. Indication:  MSSA C-spine epidural abscess First Dose: No Last Day of Therapy:  01/01/21 Labs - Once weekly:  CBC/D and BMP, Labs - Every other week:  ESR and CRP Method of administration: IV Push Method of administration may be changed at the discretion of home infusion pharmacist based upon assessment of the patient and/or caregiver's ability to self-administer the medication ordered.     ASK your doctor about these medications   diphenhydrAMINE 25 MG  tablet Commonly known as: BENADRYL Take 25 mg by mouth every 6 (six) hours as needed for allergies.   HYDROcodone-acetaminophen 5-325 MG tablet Commonly known as: NORCO/VICODIN Take 1 tablet by mouth every 6 (six) hours as needed.   metFORMIN 1000 MG tablet Commonly known as: GLUCOPHAGE Take 1,000 mg by mouth 2 (two) times daily. Ask about: Which instructions should I use?   metoprolol tartrate 50 MG tablet Commonly known as: LOPRESSOR Take 50 mg by mouth 2 (two) times daily.   oxyCODONE 5 MG immediate release tablet Commonly known as: Oxy IR/ROXICODONE Take 5 mg by mouth 3 (three) times daily as needed for moderate pain.            Durable Medical Equipment  (From admission, onward)         Start     Ordered   11/18/20 2300  DME 3 n 1  Once        11/18/20 2259           Discharge Care Instructions  (From admission, onward)         Start     Ordered   11/27/20 0000  Change dressing on IV access line weekly and PRN  (Home infusion instructions - [facility] )        11/27/20 1457          Follow-up Information    Comer, Okey Regal, MD Follow up on 12/22/2020.   Specialty: Infectious Diseases Why: 1:45 pm appointment. Please arrive 15 minutes early  Contact information: [address] [place] [address] [phone]              No Known Allergies  Discharge Exam: BP (!) 177/84 (BP Location: Right Leg)   Pulse (!) 108   Temp 98.3 F (36.8 C) (Oral)   Resp 16   Ht _0  (1.676 m)   Wt 97.6 kg   SpO2 100%   BMI 34.73 kg/m  General appearance: alert, cooperative, no distress, slowed mentation and deconditioned appearing Head: Normocephalic, without obvious abnormality, atraumatic Eyes: EOMI  Neck: New honeycomb dressing in place with no noted blood underneath Lungs: clear to auscultation bilaterally Heart: regular rate and rhythm and S1, S2 normal Abdomen: Softer today with improved bowel movements and no palpable  stool burden Extremities: RUE wrapped in dressing with edematous fingers noted  Skin: mobility and turgor normal Neurologic: Moves all 4 extremities, follows commands  The results of significant diagnostics from this hospitalization (including imaging, microbiology, ancillary and laboratory) are listed below for reference.   Microbiology: Recent Results (from the past 240 hour(s))  SARS Coronavirus 2 by RT PCR (hospital order, performed in [facility] hospital lab) Nasopharyngeal Nasopharyngeal Swab     Status: None   Collection Time: 11/18/20  4:53 PM   Specimen: Nasopharyngeal Swab  Result Value Ref Range Status   SARS Coronavirus 2 NEGATIVE NEGATIVE Final  Comment: (NOTE) SARS-CoV-2 target nucleic acids are NOT DETECTED.  The SARS-CoV-2 RNA is generally detectable in upper and lower respiratory specimens during the acute phase of infection. The lowest concentration of SARS-CoV-2 viral copies this assay can detect is 250 copies / mL. A negative result does not preclude SARS-CoV-2 infection and should not be used as the sole basis for treatment or other patient management decisions.  A negative result may occur with improper specimen collection / handling, submission of specimen other than nasopharyngeal swab, presence of viral mutation(s) within the areas targeted by this assay, and inadequate number of viral copies (<250 copies / mL). A negative result must be combined with clinical observations, patient history, and epidemiological information.  Fact Sheet for Patients:   StrictlyIdeas.no  Fact Sheet for Healthcare Providers: BankingDealers.co.za  This test is not yet approved or  cleared by the [place] FDA and has been authorized for detection and/or diagnosis of SARS-CoV-2 by FDA under an Emergency Use Authorization (EUA).  This EUA will remain in effect (meaning this test can be used) for the duration of the COVID-19  declaration under Section 564(b)(1) of the Act, 21 U.S.C. section 360bbb-3(b)(1), unless the authorization is terminated or revoked sooner.  Performed at [facility], [place] [address], [place], [place] [address]   Aerobic/Anaerobic Culture w Gram Stain (surgical/deep wound)     Status: None   Collection Time: 11/18/20  7:06 PM   Specimen: Abscess  Result Value Ref Range Status   Specimen Description ABSCESS  Final   Special Requests CERVICAL  Final   Gram Stain   Final    FEW WBC PRESENT, PREDOMINANTLY PMN ABUNDANT GRAM POSITIVE COCCI    Culture   Final    FEW STAPHYLOCOCCUS AUREUS NO ANAEROBES ISOLATED Performed at [facility], [place] [address] [place], [place] [address]    Report Status 11/23/2020 FINAL  Final   Organism ID, Bacteria STAPHYLOCOCCUS AUREUS  Final      Susceptibility   Staphylococcus aureus - MIC*    CIPROFLOXACIN <=0.5 SENSITIVE Sensitive     ERYTHROMYCIN <=0.25 SENSITIVE Sensitive     GENTAMICIN <=0.5 SENSITIVE Sensitive     OXACILLIN <=0.25 SENSITIVE Sensitive     TETRACYCLINE <=1 SENSITIVE Sensitive     VANCOMYCIN <=0.5 SENSITIVE Sensitive     TRIMETH/SULFA <=10 SENSITIVE Sensitive     CLINDAMYCIN <=0.25 SENSITIVE Sensitive     RIFAMPIN <=0.5 SENSITIVE Sensitive     Inducible Clindamycin NEGATIVE Sensitive     * FEW STAPHYLOCOCCUS AUREUS  Culture, blood (routine x 2)     Status: None   Collection Time: 11/18/20  9:56 PM   Specimen: BLOOD  Result Value Ref Range Status   Specimen Description BLOOD SITE NOT SPECIFIED  Final   Special Requests   Final    BOTTLES DRAWN AEROBIC AND ANAEROBIC Blood Culture adequate volume   Culture   Final    NO GROWTH 5 DAYS Performed at [facility], [place] [address] [place], [place] [address]    Report Status 11/23/2020 FINAL  Final  Culture, blood (routine x 2)     Status: None   Collection Time: 11/18/20 10:03 PM   Specimen: BLOOD  Result Value Ref Range Status   Specimen Description BLOOD SITE  NOT SPECIFIED  Final   Special Requests   Final    BOTTLES DRAWN AEROBIC AND ANAEROBIC Blood Culture adequate volume   Culture   Final    NO GROWTH 5  DAYS Performed at [facility], [place] [address], [place], [place] [address]    Report Status 11/23/2020 FINAL  Final  MRSA PCR Screening     Status: None   Collection Time: 11/19/20 12:46 AM   Specimen: Nasopharyngeal  Result Value Ref Range Status   MRSA by PCR NEGATIVE NEGATIVE Final    Comment:        The GeneXpert MRSA Assay (FDA approved for NASAL specimens only), is one component of a comprehensive MRSA colonization surveillance program. It is not intended to diagnose MRSA infection nor to guide or monitor treatment for MRSA infections. Performed at [facility], [place] [address] [place], [place] [address]   Aerobic/Anaerobic Culture w Gram Stain (surgical/deep wound)     Status: None   Collection Time: 11/20/20  6:53 PM   Specimen: PATH Cytology Misc. fluid; Body Fluid  Result Value Ref Range Status   Specimen Description WOUND RIGHT FOREARM  Final   Special Requests NONE  Final   Gram Stain   Final    FEW WBC PRESENT, PREDOMINANTLY PMN ABUNDANT GRAM POSITIVE COCCI    Culture   Final    ABUNDANT STAPHYLOCOCCUS AUREUS NO ANAEROBES ISOLATED Performed at [facility], [address] [place], [place] [address]    Report Status 11/25/2020 FINAL  Final   Organism ID, Bacteria STAPHYLOCOCCUS AUREUS  Final      Susceptibility   Staphylococcus aureus - MIC*    CIPROFLOXACIN <=0.5 SENSITIVE Sensitive     ERYTHROMYCIN <=0.25 SENSITIVE Sensitive     GENTAMICIN <=0.5 SENSITIVE Sensitive     OXACILLIN 0.5 SENSITIVE Sensitive     TETRACYCLINE <=1 SENSITIVE Sensitive     VANCOMYCIN <=0.5 SENSITIVE Sensitive     TRIMETH/SULFA <=10 SENSITIVE Sensitive     CLINDAMYCIN <=0.25 SENSITIVE Sensitive     RIFAMPIN <=0.5 SENSITIVE Sensitive     Inducible Clindamycin NEGATIVE Sensitive     * ABUNDANT STAPHYLOCOCCUS  AUREUS  Fungus Culture With Stain     Status: None (Preliminary result)   Collection Time: 11/20/20  6:53 PM  Result Value Ref Range Status   Fungus Stain Final report  Final    Comment: (NOTE) Performed At: [facility] [address] [place], [place] [id number] Rush Farmer MD CV:[id number]    Fungus (Mycology) Culture PENDING  Incomplete   Fungal Source WOUND  Final    Comment: RIGHT FOREARM Performed at [facility], [place] [address], [place], [place] [address]   Fungus Culture Result     Status: None   Collection Time: 11/20/20  6:53 PM  Result Value Ref Range Status   Result 1 Comment  Final    Comment: (NOTE) KOH/Calcofluor preparation:  no fungus observed. Performed At: [facility] [place], [place] [id number] Rush Farmer MD LY:[id number]      Labs: BNP (last 3 results) No results for input(s): BNP in the last 8760 hours. Basic Metabolic Panel: Recent Labs  Lab 11/22/20 0604 11/23/20 1000 11/24/20 0257 11/25/20 0645 11/26/20 0418 11/27/20 0500  NA 134* 137 137 137 137 138  K 3.6 3.1* 3.5 3.0* 3.9 3.3*  CL 104 105 105 105 105 103  CO2 21* _0 GLUCOSE 106* 118* 178* 155* 157* 157*  BUN 25* _1 CREATININE 0.94 0.90 0.91 0.77 0.73 0.74  CALCIUM 8.1* 7.8* 7.8* 8.0* 7.9* 8.1*  MG 2.0  --   --  1.8  1.9 1.9  PHOS 4.0  --   --   --   --   --    Liver Function Tests: Recent Labs  Lab 11/21/20 0512 11/22/20 0604 11/23/20 1000 11/24/20 0257 11/25/20 0645  AST 26 68* 236* 74* 31  ALT 22 40 146* 95* 53*  ALKPHOS 191* 203* 273* 232* 212*  BILITOT 0.7 0.6 2.4* 1.9* 1.1  PROT 5.4* 5.8* 6.1* 5.5* 5.8*  ALBUMIN 1.2* 1.3* 1.3* 1.2* 1.4*   No results for input(s): LIPASE, AMYLASE in the last 168 hours. No results for input(s): AMMONIA in the last 168 hours. CBC: Recent Labs  Lab 11/22/20 0604 11/24/20 0257 11/25/20 0645 11/26/20 0418 11/27/20 0500  WBC 25.2* 22.7* 20.7* 18.8* 21.3*   NEUTROABS  --   --  16.3* 13.8* 16.7*  HGB 11.4* 12.1* 12.2* 11.9* 11.7*  HCT 34.6* 36.3* 36.4* 35.4* 35.2*  MCV 94.3 93.1 91.9 94.7 92.9  PLT 388 465* 538* 511* 507*   Cardiac Enzymes: No results for input(s): CKTOTAL, CKMB, CKMBINDEX, TROPONINI in the last 168 hours. BNP: Invalid input(s): POCBNP CBG: Recent Labs  Lab 11/26/20 1134 11/26/20 1555 11/26/20 2156 11/27/20 0610 11/27/20 1101  GLUCAP 137* 134* 184* 162* 120*   D-Dimer No results for input(s): DDIMER in the last 72 hours. Hgb A1c No results for input(s): HGBA1C in the last 72 hours. Lipid Profile No results for input(s): CHOL, HDL, LDLCALC, TRIG, CHOLHDL, LDLDIRECT in the last 72 hours. Thyroid function studies Recent Labs    11/24/20 1600  TSH 0.957   Anemia work up Recent Labs    11/24/20 1600  VITAMINB12 1,162*  FOLATE 15.3  FERRITIN 1,152*  TIBC 150*  IRON 29*   Urinalysis    Component Value Date/Time   COLORURINE YELLOW 08/20/2018 1102   APPEARANCEUR CLEAR 08/20/2018 1102   LABSPEC <1.005 (L) 08/20/2018 1102   PHURINE 7.5 08/20/2018 1102   GLUCOSEU >=500 (A) 08/20/2018 1102   HGBUR TRACE (A) 08/20/2018 1102   BILIRUBINUR NEGATIVE 08/20/2018 1102   KETONESUR 15 (A) 08/20/2018 1102   PROTEINUR NEGATIVE 08/20/2018 1102   NITRITE NEGATIVE 08/20/2018 1102   LEUKOCYTESUR NEGATIVE 08/20/2018 1102   Sepsis Labs Invalid input(s): PROCALCITONIN,  WBC,  LACTICIDVEN Microbiology Recent Results (from the past 240 hour(s))  SARS Coronavirus 2 by RT PCR (hospital order, performed in [place] hospital lab) Nasopharyngeal Nasopharyngeal Swab     Status: None   Collection Time: 11/18/20  4:53 PM   Specimen: Nasopharyngeal Swab  Result Value Ref Range Status   SARS Coronavirus 2 NEGATIVE NEGATIVE Final    Comment: (NOTE) SARS-CoV-2 target nucleic acids are NOT DETECTED.  The SARS-CoV-2 RNA is generally detectable in upper and lower respiratory specimens during the acute phase of infection. The  lowest concentration of SARS-CoV-2 viral copies this assay can detect is 250 copies / mL. A negative result does not preclude SARS-CoV-2 infection and should not be used as the sole basis for treatment or other patient management decisions.  A negative result may occur with improper specimen collection / handling, submission of specimen other than nasopharyngeal swab, presence of viral mutation(s) within the areas targeted by this assay, and inadequate number of viral copies (<250 copies / mL). A negative result must be combined with clinical observations, patient history, and epidemiological information.  Fact Sheet for Patients:   StrictlyIdeas.no  Fact Sheet for Healthcare Providers: BankingDealers.co.za  This test is not yet approved or  cleared by the [place] FDA and has been authorized  for detection and/or diagnosis of SARS-CoV-2 by FDA under an Emergency Use Authorization (EUA).  This EUA will remain in effect (meaning this test can be used) for the duration of the COVID-19 declaration under Section 564(b)(1) of the Act, 21 U.S.C. section 360bbb-3(b)(1), unless the authorization is terminated or revoked sooner.  Performed at [facility], [place] [address], [place], [place] [address]   Aerobic/Anaerobic Culture w Gram Stain (surgical/deep wound)     Status: None   Collection Time: 11/18/20  7:06 PM   Specimen: Abscess  Result Value Ref Range Status   Specimen Description ABSCESS  Final   Special Requests CERVICAL  Final   Gram Stain   Final    FEW WBC PRESENT, PREDOMINANTLY PMN ABUNDANT GRAM POSITIVE COCCI    Culture   Final    FEW STAPHYLOCOCCUS AUREUS NO ANAEROBES ISOLATED Performed at [facility], [place] [address], [place], [place] [address]    Report Status 11/23/2020 FINAL  Final   Organism ID, Bacteria STAPHYLOCOCCUS AUREUS  Final      Susceptibility   Staphylococcus aureus - MIC*    CIPROFLOXACIN  <=0.5 SENSITIVE Sensitive     ERYTHROMYCIN <=0.25 SENSITIVE Sensitive     GENTAMICIN <=0.5 SENSITIVE Sensitive     OXACILLIN <=0.25 SENSITIVE Sensitive     TETRACYCLINE <=1 SENSITIVE Sensitive     VANCOMYCIN <=0.5 SENSITIVE Sensitive     TRIMETH/SULFA <=10 SENSITIVE Sensitive     CLINDAMYCIN <=0.25 SENSITIVE Sensitive     RIFAMPIN <=0.5 SENSITIVE Sensitive     Inducible Clindamycin NEGATIVE Sensitive     * FEW STAPHYLOCOCCUS AUREUS  Culture, blood (routine x 2)     Status: None   Collection Time: 11/18/20  9:56 PM   Specimen: BLOOD  Result Value Ref Range Status   Specimen Description BLOOD SITE NOT SPECIFIED  Final   Special Requests   Final    BOTTLES DRAWN AEROBIC AND ANAEROBIC Blood Culture adequate volume   Culture   Final    NO GROWTH 5 DAYS Performed at [facility], [place] [address], [place], [place] [address]    Report Status 11/23/2020 FINAL  Final  Culture, blood (routine x 2)     Status: None   Collection Time: 11/18/20 10:03 PM   Specimen: BLOOD  Result Value Ref Range Status   Specimen Description BLOOD SITE NOT SPECIFIED  Final   Special Requests   Final    BOTTLES DRAWN AEROBIC AND ANAEROBIC Blood Culture adequate volume   Culture   Final    NO GROWTH 5 DAYS Performed at [facility], [place] [address] [place], [place] [address]    Report Status 11/23/2020 FINAL  Final  MRSA PCR Screening     Status: None   Collection Time: 11/19/20 12:46 AM   Specimen: Nasopharyngeal  Result Value Ref Range Status   MRSA by PCR NEGATIVE NEGATIVE Final    Comment:        The GeneXpert MRSA Assay (FDA approved for NASAL specimens only), is one component of a comprehensive MRSA colonization surveillance program. It is not intended to diagnose MRSA infection nor to guide or monitor treatment for MRSA infections. Performed at [facility], [place] [address] [place], [place] [address]   Aerobic/Anaerobic Culture w Gram Stain (surgical/deep wound)     Status:  None   Collection Time: 11/20/20  6:53 PM   Specimen: PATH Cytology Misc. fluid; Body Fluid  Result Value Ref Range  Status   Specimen Description WOUND RIGHT FOREARM  Final   Special Requests NONE  Final   Gram Stain   Final    FEW WBC PRESENT, PREDOMINANTLY PMN ABUNDANT GRAM POSITIVE COCCI    Culture   Final    ABUNDANT STAPHYLOCOCCUS AUREUS NO ANAEROBES ISOLATED Performed at [facility], [address], [place], [place] [address]    Report Status 11/25/2020 FINAL  Final   Organism ID, Bacteria STAPHYLOCOCCUS AUREUS  Final      Susceptibility   Staphylococcus aureus - MIC*    CIPROFLOXACIN <=0.5 SENSITIVE Sensitive     ERYTHROMYCIN <=0.25 SENSITIVE Sensitive     GENTAMICIN <=0.5 SENSITIVE Sensitive     OXACILLIN 0.5 SENSITIVE Sensitive     TETRACYCLINE <=1 SENSITIVE Sensitive     VANCOMYCIN <=0.5 SENSITIVE Sensitive     TRIMETH/SULFA <=10 SENSITIVE Sensitive     CLINDAMYCIN <=0.25 SENSITIVE Sensitive     RIFAMPIN <=0.5 SENSITIVE Sensitive     Inducible Clindamycin NEGATIVE Sensitive     * ABUNDANT STAPHYLOCOCCUS AUREUS  Fungus Culture With Stain     Status: None (Preliminary result)   Collection Time: 11/20/20  6:53 PM  Result Value Ref Range Status   Fungus Stain Final report  Final    Comment: (NOTE) Performed At: [facility] [address] [place], [place] [id number] Rush Farmer MD [id number]    Fungus (Mycology) Culture PENDING  Incomplete   Fungal Source WOUND  Final    Comment: RIGHT FOREARM Performed at [facility], [place] [address] [place], [place] [address]   Fungus Culture Result     Status: None   Collection Time: 11/20/20  6:53 PM  Result Value Ref Range Status   Result 1 Comment  Final    Comment: (NOTE) KOH/Calcofluor preparation:  no fungus observed. Performed At: [facility] [place], [place] [id number] Rush Farmer MD ZJ:[id number]     Procedures/Studies: DG Abd 1 View  Result Date:  11/21/2020 CLINICAL DATA:  OG tube placement. EXAM: ABDOMEN - 1 VIEW COMPARISON:  Film earlier this day FINDINGS: OG tube tip is identified overlying the mid stomach. No other significant changes noted. IMPRESSION: OG tube with tip overlying the mid stomach. Electronically Signed   By: Margarette Canada M.D.   On: 11/21/2020 12:11   DG Abd 1 View  Result Date: 11/21/2020 CLINICAL DATA:  OG tube placement EXAM: ABDOMEN - 1 VIEW COMPARISON:  None. FINDINGS: Esophagogastric tube is positioned with tip below the diaphragm, side port above the gastroesophageal junction. Left pleural effusion. Gas-filled bowel in the included upper abdomen. IMPRESSION: 1. Esophagogastric tube is positioned with tip below the diaphragm, side port above the gastroesophageal junction. Recommend advancement to ensure subdiaphragmatic position. 2. Left pleural effusion. Electronically Signed   By: Eddie Candle M.D.   On: 11/21/2020 11:04   CT Cervical Spine Wo Contrast  Result Date: 11/09/2020 CLINICAL DATA:  Left-sided neck pain radiating into head EXAM: CT CERVICAL SPINE WITHOUT CONTRAST TECHNIQUE: Multidetector CT imaging of the cervical spine was performed without intravenous contrast. Multiplanar CT image reconstructions were also generated. COMPARISON:  None. FINDINGS: Alignment: There is reversal cervical lordosis with mild kyphosis centered at the C5 level. This may be positional or due to lower cervical spondylosis. Otherwise alignment is anatomic. Skull base and vertebrae: No acute fracture. No primary bone lesion or focal pathologic process. Soft tissues and spinal canal: No prevertebral fluid or swelling. No visible canal hematoma. Disc levels: Prominent  spondylosis at C5-6, C6-7, and C7-T1, with mild symmetrical neural foraminal encroachment as result of circumferential disc osteophyte complex and uncovertebral hypertrophy. Left predominant facet hypertrophy at C2-3, C3-4, and C4-5, with mild left-sided neural foraminal  encroachment. Upper chest: Airway is patent.  Lung apices are clear. Other: Reconstructed images demonstrate no additional findings. IMPRESSION: 1. No acute cervical spine fracture. 2. Prominent cervical spondylosis from C5-6 through C7-T1, with symmetrical neural foraminal encroachment. 3. Left predominant facet hypertrophy from C2-3 through C4-5, with mild left-sided neural foraminal encroachment. Electronically Signed   By: Randa Ngo M.D.   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 EXAM: MRI CERVICAL SPINE WITHOUT CONTRAST TECHNIQUE: Multiplanar, multisequence MR imaging of the cervical spine was performed. No intravenous contrast was administered. COMPARISON:  CT of the cervical spine November 09, 2020. FINDINGS: Alignment: Mild reversal of the cervical curvature. Vertebrae: No fracture, evidence of discitis, or bone lesion. Cord: Mass effect on the cord from C4 through C7. No cord signal abnormality. Posterior Fossa, vertebral arteries, paraspinal tissues: Prominent multiloculated fluid collections involving the paraspinal musculature from the level of C1 through C7, predominantly on the left with susceptibility artifact noted in the largest pocket within the left semispinalis muscle at the C2 level, likely representing air. This fluid collections communicate with a large epidural fluid collection through the interlaminar space at the C3-4 level. The epidural fluid collection extending from the level of C3 through the visualized upper thoracic spine. There is mass effect on the cord with obliteration of the CSF space at C5-6 and C6-7. Disc levels: C2-3: Small posterior disc protrusion resulting mild spinal canal stenosis. Prominent facet degenerative changes with bilateral joint effusion resulting mild-to-moderate left neural foraminal narrowing. C3-4: Posterior disc protrusion which in association with the  epidural collection results in moderate narrowing of the thecal sac. Uncovertebral and facet degenerative changes resulting in severe left neural foraminal narrowing. C4-5: Small posterior disc protrusion which in association with the epidural collection resulting in moderate narrowing of the thecal sac. Mild uncovertebral degenerative change and facet degenerative changes, more pronounced on the left, resulting in mild left neural foraminal narrowing. C5-6: Posterior disc protrusion which in association with the epidural collection resulting in moderate narrowing of the thecal sac. Uncovertebral and facet degenerative changes resulting in severe right and mild left neural foraminal narrowing. C6-7: Posterior disc protrusion which in association with the epidural collection resulting in narrowing of the thecal sac. Uncovertebral and facet degenerative changes result severe left neural foraminal narrowing. C7-T1: Epidural fluid collection resulting in moderate narrowing of the thecal sac. Uncovertebral and facet degenerative changes resulting in mild left neural foraminal narrowing. IMPRESSION: 1. Prominent multiloculated fluid collections involving the paraspinal musculature from the level of C1 through C7. This collection communicates with and extensive epidural fluid collection through the interlaminar space at the C3-4 level. The epidural collection extends from C2 through the visualized upper thoracic spine. 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 Recommend MRI of the thoracic spine without and with contrast to evaluate for disease extension. 2. There is mass effect on the cord with obliteration of the CSF space at C5-6 and C6-7. 3. Multilevel degenerative changes of the cervical spine, as described above. These results were called by telephone at the time of interpretation on 11/18/2020 at 4:03 pm to Dr. Earnie Larsson , who verbally acknowledged these results. Electronically Signed   By:  Pedro Earls M.D.   On: 11/18/2020 16:05   MR THORACIC SPINE W WO CONTRAST  Result Date: 11/21/2020 CLINICAL DATA:  Cervical epidural abscess. Patient is status post surgical evacuation and laminectomy EXAM: MRI THORACIC WITHOUT AND WITH CONTRAST TECHNIQUE: Multiplanar and multiecho pulse sequences of the thoracic spine were obtained without and with intravenous contrast. CONTRAST:  65m GADAVIST GADOBUTROL 1 MMOL/ML IV SOLN COMPARISON:  Cervical spine MRI 11/18/2020 FINDINGS: Alignment:  Physiologic. Vertebrae: No fracture, evidence of discitis, or bone lesion. Cord: Normal signal and morphology. No thoracic epidural fluid collection. Thin epidural enhancement of the visualized lower cervical spine extending inferiorly to the T1-T2 level (series 11, image 7) without evidence of residual epidural fluid collection. Paraspinal and other soft tissues: Postsurgical changes within the posterior paraspinal soft tissues of the lower cervical spine are partially imaged at the edge of the field of view. Lower cervical posterior decompression. Simple appearing fluid collection at the laminectomy bed within the lower cervical spine seen only on sagittal sequences at the edge of the field of view measuring approximately 2.8 x 2.1 cm (series 7, image 5). There is intramuscular edema within the paraspinal musculature of the lower cervical spine. Disc levels: Shallow disc protrusion at the T9-10 level without foraminal or canal stenosis. Remaining thoracic intervertebral discs are within normal limits. There is no foraminal or canal stenosis at any level within the thoracic spine. IMPRESSION: 1. No thoracic epidural fluid collection. 2. Thin epidural enhancement of the visualized lower cervical spine extending inferiorly to the T1-T2 level without evidence of residual epidural fluid collection. Findings are likely secondary to known infection and recent surgery. 3. Postsurgical changes within the posterior  paraspinal soft tissues of the lower cervical spine are partially imaged at the edge of the field of view. Simple appearing fluid collection at the laminectomy bed within the lower cervical spine seen only on sagittal sequences at the edge of the field of view measuring approximately 2.8 x 2.1 cm. 4. Intramuscular edema within the paraspinal musculature of the lower cervical spine, which may reflect postsurgical change and/or myositis. Electronically Signed   By: NDavina PokeD.O.   On: 11/21/2020 16:31   CT FOREARM RIGHT W CONTRAST  Result Date: 11/20/2020 CLINICAL DATA:  Right forearm pain.  Fever.  Infection suspected EXAM: CT OF THE UPPER RIGHT EXTREMITY WITH CONTRAST TECHNIQUE: Multidetector CT imaging of the upper right extremity was performed according to the standard protocol following intravenous contrast administration. CONTRAST:  1026mOMNIPAQUE IOHEXOL 300 MG/ML  SOLN COMPARISON:  None. FINDINGS: Bones/Joint/Cartilage No acute fracture. No dislocation. No bony erosion or periosteal elevation. Mild-to-moderate arthropathy of the right elbow with subchondral cystic changes in the capitellum. No appreciable elbow joint effusion. Well-circumscribed cystic structure within the triquetrum, likely an intraosseous ganglion. No definite radiocarpal joint effusion. Ligaments Suboptimally assessed by CT. Muscles and Tendons Elongated area of relative decreased attenuation within the flexor compartment musculature of the proximal forearm, which appears predominantly located within the brachioradialis muscle and possibly a portion of the extensor carpi radialis longus muscle measuring approximately 3.2 x 1.2 x 14.0 cm (see series 6, images 1-83). No well-defined enhancing rim. Remaining musculotendinous structures appear grossly intact. No appreciable tenosynovial fluid collections at the level of the wrist. Soft tissues Circumferential subcutaneous edema. No fluid collections are evident within the  subcutaneous soft tissues. No soft tissue gas. IMPRESSION: 1. Elongated area of relative decreased attenuation within the flexor compartment musculature of the proximal to mid forearm, which appears  predominantly located within the brachioradialis muscle and possibly a portion of the extensor carpi radialis longus muscle measuring approximately 3.2 x 1.2 x 14.0 cm. No well-defined enhancing rim. Findings are nonspecific and could reflect myositis and possibly developing intramuscular phlegmon or abscess. Consider further evaluation with ultrasound or MRI to assess for a fluid component. 2. Circumferential subcutaneous edema, which may represent cellulitis. No fluid collections within the subcutaneous soft tissues. No soft tissue gas. 3. No acute osseous abnormality. Electronically Signed   By: Davina Poke D.O.   On: 11/20/2020 11:49   DG Chest Port 1 View  Result Date: 11/20/2020 CLINICAL DATA:  Status post central line placement EXAM: PORTABLE CHEST 1 VIEW COMPARISON:  11/19/2020 FINDINGS: Left jugular central line is noted with catheter tip at the proximal superior vena cava. No pneumothorax is noted. Endotracheal tube is seen in satisfactory position. Cardiac shadow is prominent but stable. Lungs are hypoinflated with increasing opacity in the left base. No bony abnormality is seen. IMPRESSION: Tubes and lines as described. Hypoinflation with increasing left basilar opacity. Electronically Signed   By: Inez Catalina M.D.   On: 11/20/2020 20:42   DG CHEST PORT 1 VIEW  Result Date: 11/19/2020 CLINICAL DATA:  Hypoxia EXAM: PORTABLE CHEST 1 VIEW COMPARISON:  None. FINDINGS: Lung volumes are low. Some streaky opacities favor atelectasis. More dense airspace opacities noted in the retrocardiac space. No visible pneumothorax. Suspect a small right pleural effusion. No visible left effusion. Cardiac silhouette is enlarged though may be accentuated by portable technique. Telemetry leads overlie the chest. No  acute osseous or soft tissue abnormality. Degenerative changes are present in the imaged spine and shoulders. Dextrocurvature of the spine. IMPRESSION: 1. Low lung volumes with bibasilar atelectasis 2. More dense airspace opacity in the retrocardiac space, could reflect pneumonia or further volume loss. 3. Small right pleural effusion. 4. Suspect cardiomegaly though may be accentuated by portable technique. Electronically Signed   By: Lovena Le M.D.   On: 11/19/2020 01:15   DG Abd Portable 1V  Result Date: 11/26/2020 CLINICAL DATA:  Abdominal pain, distention and constipation. EXAM: PORTABLE ABDOMEN - 1 VIEW COMPARISON:  None. FINDINGS: The bowel gas pattern is normal. Moderate amount of formed stool matter in the left colon. No radio-opaque calculi or other significant radiographic abnormality are seen. IMPRESSION: 1. Nonobstructive bowel gas pattern. 2. Moderate amount of formed stool matter in the left colon. Electronically Signed   By: Fidela Salisbury M.D.   On: 11/26/2020 14:51   ECHOCARDIOGRAM COMPLETE  Result Date: 11/20/2020    ECHOCARDIOGRAM REPORT   Patient Name:   GORDAN GRELL Cline Date of Exam: 11/20/2020 Medical Rec #:  [id number]          Height:       66.0 in Accession #:    [id number]         Weight:       185.0 lb Date of Birth:  Mar 24, 1956         BSA:          1.935 m Patient Age:    63 years           BP:           167/60 mmHg Patient Gender: M                  HR:           93 bpm. Exam Location:  Inpatient Procedure: 2D Echo, Intracardiac Opacification Agent, Color Doppler  and Cardiac            Doppler Indications:    AI  History:        Patient has no prior history of Echocardiogram examinations.                 Risk Factors:Diabetes.  Sonographer:    Luisa Hart RDCS Referring Phys: [place] Comments: Suboptimal apical window. IMPRESSIONS  1. Left ventricular ejection fraction, by estimation, is 35 to 40%. The left ventricle has moderately decreased  function. The left ventricle demonstrates global hypokinesis. There is mild concentric left ventricular hypertrophy. Left ventricular diastolic parameters are consistent with Grade II diastolic dysfunction (pseudonormalization). Elevated left atrial pressure.  2. Right ventricular systolic function is normal. The right ventricular size is normal.  3. The mitral valve is normal in structure. Mild mitral valve regurgitation. No evidence of mitral stenosis.  4. The aortic valve is normal in structure. Aortic valve regurgitation is present, unable to assess severity due to poor technical quality of the study. Suspect mild regurgitation. No aortic stenosis is present.  5. The inferior vena cava is normal in size with greater than 50% respiratory variability, suggesting right atrial pressure of 3 mmHg. Conclusion(s)/Recommendation(s): No evidence of valvular vegetations on this transthoracic echocardiogram. Unable to reliably assess the degree of aortic insufficiency, but it does not appear to be severe. Would recommend a transesophageal echocardiogram  to exclude infective endocarditis if clinically indicated. However, this will need to be performed as a delayed study following recent cervical spine surgery. FINDINGS  Left Ventricle: Left ventricular ejection fraction, by estimation, is 35 to 40%. The left ventricle has moderately decreased function. The left ventricle demonstrates global hypokinesis. Definity contrast agent was given IV to delineate the left ventricular endocardial borders. The left ventricular internal cavity size was normal in size. There is mild concentric left ventricular hypertrophy. Left ventricular diastolic parameters are consistent with Grade II diastolic dysfunction (pseudonormalization). Elevated left atrial pressure. Right Ventricle: The right ventricular size is normal. No increase in right ventricular wall thickness. Right ventricular systolic function is normal. Left Atrium: Left atrial  size was normal in size. Right Atrium: Right atrial size was normal in size. Pericardium: There is no evidence of pericardial effusion. Mitral Valve: The mitral valve is normal in structure. Mild mitral valve regurgitation. No evidence of mitral valve stenosis. MV peak gradient, 5.1 mmHg. The mean mitral valve gradient is 2.0 mmHg. Tricuspid Valve: The tricuspid valve is normal in structure. Tricuspid valve regurgitation is not demonstrated. No evidence of tricuspid stenosis. Aortic Valve: The aortic valve is normal in structure. Aortic valve regurgitation is present, unable to assess severity due to poor technical quality of the study. Suspect mild regurgitation. No aortic stenosis is present. Aortic valve mean gradient measures 2.0 mmHg. Aortic valve peak gradient measures 3.5 mmHg. Aortic valve area, by VTI measures 2.50 cm. Pulmonic Valve: The pulmonic valve was normal in structure. Pulmonic valve regurgitation is not visualized. No evidence of pulmonic stenosis. Aorta: The aortic root is normal in size and structure. Venous: The inferior vena cava is normal in size with greater than 50% respiratory variability, suggesting right atrial pressure of 3 mmHg. IAS/Shunts: No atrial level shunt detected by color flow Doppler.  LEFT VENTRICLE PLAX 2D LVIDd:         4.80 cm  Diastology LVIDs:         4.10 cm  LV e' medial:    5.77 cm/s LV PW:  1.30 cm  LV E/e' medial:  18.0 LV IVS:        1.10 cm  LV e' lateral:   5.43 cm/s LVOT diam:     2.00 cm  LV E/e' lateral: 19.2 LV SV:         35 LV SV Index:   18 LVOT Area:     3.14 cm  RIGHT VENTRICLE RV S prime:     11.50 cm/s TAPSE (M-mode): 1.4 cm LEFT ATRIUM             Index       RIGHT ATRIUM           Index LA diam:        3.40 cm 1.76 cm/m  RA Area:     11.50 cm LA Vol (A2C):   34.1 ml 17.63 ml/m RA Volume:   23.20 ml  11.99 ml/m LA Vol (A4C):   45.5 ml 23.52 ml/m LA Biplane Vol: 39.5 ml 20.42 ml/m  AORTIC VALVE                   PULMONIC VALVE AV Area  (Vmax):    2.54 cm    PV Vmax:       0.79 m/s AV Area (Vmean):   2.70 cm    PV Vmean:      68.600 cm/s AV Area (VTI):     2.50 cm    PV VTI:        0.171 m AV Vmax:           92.90 cm/s  PV Peak grad:  2.5 mmHg AV Vmean:          61.600 cm/s PV Mean grad:  2.0 mmHg AV VTI:            0.142 m AV Peak Grad:      3.5 mmHg AV Mean Grad:      2.0 mmHg LVOT Vmax:         75.10 cm/s LVOT Vmean:        53.000 cm/s LVOT VTI:          0.113 m LVOT/AV VTI ratio: 0.80  AORTA Ao Root diam: 3.40 cm Ao Asc diam:  3.30 cm MITRAL VALVE MV Area (PHT): 6.12 cm     SHUNTS MV Area VTI:   1.61 cm     Systemic VTI:  0.11 m MV Peak grad:  5.1 mmHg     Systemic Diam: 2.00 cm MV Mean grad:  2.0 mmHg MV Vmax:       1.13 m/s MV Vmean:      62.5 cm/s MV Decel Time: 124 msec MR Peak grad: 89.5 mmHg MR Mean grad: 55.0 mmHg MR Vmax:      473.00 cm/s MR Vmean:     345.0 cm/s MV E velocity: 104.00 cm/s MV A velocity: 61.70 cm/s MV E/A ratio:  1.69 Mihai Croitoru MD Electronically signed by Sanda Klein MD Signature Date/Time: 11/20/2020/4:36:40 PM    Final    VAS [place] UPPER EXTREMITY VENOUS DUPLEX  Result Date: 11/19/2020 UPPER VENOUS STUDY  Indications: Pain, Swelling, and Erythema Risk Factors: None identified. Limitations: Poor ultrasound/tissue interface and patient positioning, patient pain tolerance. Comparison Study: No prior studies. Performing Technologist: Oliver Hum RVT  Examination Guidelines: A complete evaluation includes B-mode imaging, spectral Doppler, color Doppler, and power Doppler as needed of all accessible portions of each vessel. Bilateral testing is considered an integral part of a complete examination. Limited  examinations for reoccurring indications may be performed as noted.  Right Findings: +----------+------------+---------+-----------+----------+-------+ RIGHT     CompressiblePhasicitySpontaneousPropertiesSummary +----------+------------+---------+-----------+----------+-------+ IJV           Full        Yes       Yes                      +----------+------------+---------+-----------+----------+-------+ Subclavian    Full       Yes       Yes                      +----------+------------+---------+-----------+----------+-------+ Axillary      Full       Yes       Yes                      +----------+------------+---------+-----------+----------+-------+ Brachial      Full       Yes       Yes                      +----------+------------+---------+-----------+----------+-------+ Radial        Full                                          +----------+------------+---------+-----------+----------+-------+ Ulnar         Full                                          +----------+------------+---------+-----------+----------+-------+ Cephalic      Full                                          +----------+------------+---------+-----------+----------+-------+ Basilic       Full                                          +----------+------------+---------+-----------+----------+-------+  Left Findings: +----------+------------+---------+-----------+----------+-------+ LEFT      CompressiblePhasicitySpontaneousPropertiesSummary +----------+------------+---------+-----------+----------+-------+ Subclavian    Full       Yes       Yes                      +----------+------------+---------+-----------+----------+-------+  Summary:  Right: No evidence of deep vein thrombosis in the upper extremity. No evidence of superficial vein thrombosis in the upper extremity.  Left: No evidence of thrombosis in the subclavian.  *See table(s) above for measurements and observations.  Diagnosing physician: Monica Martinez MD Electronically signed by Monica Martinez MD on 11/19/2020 at 1:05:37 PM.    Final    [place] EKG SITE RITE  Result Date: 11/23/2020 If Site Rite image not attached, placement could not be confirmed due to current cardiac rhythm.  US Abdomen Limited  RUQ (LIVER/GB)  Result Date: 11/24/2020 CLINICAL DATA:  Abnormal liver function tests. EXAM: ULTRASOUND ABDOMEN LIMITED RIGHT UPPER QUADRANT COMPARISON:  None. FINDINGS: Gallbladder: No gallstones or wall thickening visualized. No sonographic Murphy sign noted by sonographer. Sludge is noted within gallbladder lumen. Common bile duct: Diameter: 3 mm which is within normal limits.  Liver: No focal lesion identified. Within normal limits in parenchymal echogenicity. Portal vein is patent on color Doppler imaging with normal direction of blood flow towards the liver. Other: None. IMPRESSION: Gallbladder sludge. No other abnormality seen in the right upper quadrant of the abdomen. Electronically Signed   By: Marijo Conception M.D.   On: 11/24/2020 08:44     Time coordinating discharge: Over 30 minutes    Daniel Dee, MD  [facility] 11/27/2020, 2:59 PM

## 2020-11-27 NOTE — TOC Transition Note (Signed)
Transition of Care [facility]) - CM/SW Discharge Note   Patient Details  Name: BUCKLEY BRADLY MRN: [id number] Date of Birth: 11/19/1955  [facility] CM/SW Contact:  Kermit Balo, RN Phone Number: 11/27/2020, 3:01 PM   Clinical Narrative:    Pt discharging to CIR today. CM signing off.   Final next level of care: IP Rehab Facility Barriers to Discharge: No Barriers Identified   Patient Goals and CMS Choice     Choice offered to / list presented to : Patient  Discharge Placement                       Discharge Plan and Services                                     Social Determinants of Health (SDOH) Interventions     Readmission Risk Interventions No flowsheet data found.

## 2020-11-27 NOTE — Plan of Care (Signed)
  Problem: Safety: Goal: Non-violent Restraint(s) Outcome: Progressing   Problem: Pain Managment: Goal: General experience of comfort will improve Outcome: Progressing   Problem: Skin Integrity: Goal: Risk for impaired skin integrity will decrease Outcome: Progressing

## 2020-11-27 NOTE — Progress Notes (Signed)
Inpatient Rehab Admissions Coordinator:   Met with patient at the bedside during therapy.  He continues to be limited by pain, but has not been receiving his PRN meds regularly.  Spoke to Dr. Naaman Plummer and Dr. Posey Pronto, both agree to admit pt to CIR today.  Dr. Sabino Gasser in agreement.  TOC team aware and family aware.    Shann Medal, PT, DPT Admissions Coordinator [phone] 11/27/20  2:59 PM

## 2020-11-27 NOTE — Plan of Care (Signed)
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
## 2020-11-27 NOTE — Progress Notes (Signed)
Inpatient Rehab Admissions Coordinator:   Pt's coverage updated.  Pt has [facility] Medicare, which will require prior authorization.  CIR team and Dr. Frederick Peers aware.  I will let pt/family know.   Estill Dooms, PT, DPT Admissions Coordinator [phone] 11/27/20  3:59 PM

## 2020-11-27 NOTE — Progress Notes (Signed)
Inpatient Rehab Admissions Coordinator:   Met with pt at bedside.  He looks better than yesterday, but still reports significant pain. Note only received PRN meds x2 in the last 24+ hours.  Spoke to therapist on the floor, who is planning to see pt around 230 and asked RN to premedicate for therapy session.  Also spoke to pt's daughter in law who confirms d/c plan and states pt's son, Elta Guadeloupe, is planning to be at bedside this afternoon.  Will try to stop by during therapy session to encourage participation and hopefully premedication will help pain tolerance during session.  Will continue to follow for possible admit today/tomorrow pending pain.   Shann Medal, PT, DPT Admissions Coordinator [phone] 11/27/20  10:49 AM

## 2020-11-27 NOTE — Progress Notes (Addendum)
Progress Note    Daniel Cline   IRW:[id number]  DOB: October 23, 1955  DOA: 11/18/2020     9  PCP: Patient, No Pcp Per (Inactive)  CC: neck pain  Hospital Course: [name] is a 65 yo male with PMH uncontrolled DMII who presented with neck pain.  He was found to have cervical spine epidural abscess and was taken for emergent laminectomy/abscess evacuation on admission.  Further work-up then revealed a right forearm abscess that was also drained by orthopedic surgery.  He was initially treated in the ICU and able to be transferred out on 11/23/2020. Infectious disease was also consulted during hospitalization.  His cervical and right forearm abscesses have grown MSSA.  He has been treated with antibiotics and is deescalated down to Ancef to complete total of 6-week course.  Interval History:  Pain seems to be slowly improving; he was placed on tramadol yesterday in addition to his regimen.  Abdominal imaging also showed significant stool burden and he has responded fairly well to laxatives and has been having bowel movements. Due to his significant deconditioning, he has been having urinary retention.  Bladder scans have been performed since yesterday and he has required multiple straight caths.  An indwelling Foley catheter is being placed today until he has improved conditioning.   ROS: Constitutional: negative for chills and fevers, Respiratory: negative for cough, Cardiovascular: negative for chest pain and Gastrointestinal: negative for abdominal pain  Assessment & Plan:  Epidural abscess Right forearm abscess -Growing MSSA in both cultures  - s/p "Partial C2, complete C3, C4, C5, C6, C7 laminectomy and evacuation of epidural abscess" with neurosurgery on 3/23 - s/p R forearm I&D on 3/25 -Deescalated down to Ancef per ID.  Plan is for 6-week course total.  End date 01/01/2021 -PICC line placed 11/23/20 - seen by ortho on 3/28: remove sutures in 8-10 days; follow up with [facility]  in 10 days with Dion Saucier PA-C if outpatient for wound check/suture removal. ROM/weight bearing as tolerated; change dry dressings as needed if saturated -Start Lovenox for DVT prophylaxis on 11/27/2020  Acute urinary retention -Due to his significant deconditioning, he is likely retaining and this should improve with ongoing physical therapy and regaining some of his strength/mobility.  PVRs have been elevated and he is requiring intermittent straight cath; therefore, for now will place indwelling Foley catheter with hopes to remove as his strength and conditioning improves  Constipation  - last BM is prior to admission per charting -X-ray on 11/26/2020 showed large stool burden which was also palpable on his abdomen -Continue laxative regimen; has had a bowel movement overnight 3/31-4/1  Physical deconditioning -seen by PT/OT - recommendations are for CIR and admission is pending further pain control - discussed case with Dr. Berline Chough as well  - will continue to manage pain as able to help patient participate with therapy safely   Acute hypoxemic respiratory failure - s/p ICU and intubation for surgery; extubated on 3/27 -Continue aspiration precautions -Wean off oxygen as able  Hypertension Hypertensive urgency - resolved  -Initially required Cardene infusion in the ICU -Continue Lopressor and amlodipine -continue lisinopril (incrased to 40 mg daily) - still requiring PRN labetalol therefore add HCTZ on 11/26/20 and will also adjust as needed - may still be partly pain driven which is being addressed also   DM2 - continue SSI and CBG monitoring   Hyponatremia Hypokalemia -Replete potassium as needed  Transaminitis -Initially unclear etiology.  Possibly cholestasis of sepsis versus drug-induced -GGT  elevated: Supports hepatic etiology RUQ ultrasound shows gallbladder sludge; no other abnormalities - LFTs downtrending -Follow CMP daily  Normocytic anemia -Check iron  studies (mixed) and folate (15.3), B12 (1162) - start on ferrous sulfate PO   Old records reviewed in assessment of this patient  Antimicrobials: Cefepime 3/23 >> 3/24 Nafcillin 3/27 >> 3/29 Ancef 3/25>> current   DVT prophylaxis: enoxaparin (LOVENOX) injection 40 mg Start: 11/27/20 1000 SCD's Start: 11/18/20 2300   Code Status:   Code Status: Full Code  Objective: Blood pressure (!) 177/84, pulse (!) 108, temperature 98.3 F (36.8 C), temperature source Oral, resp. rate 16, height  (1.676 m), weight 97.6 kg, SpO2 100 %.  Examination: General appearance: alert, cooperative, no distress, slowed mentation and deconditioned appearing Head: Normocephalic, without obvious abnormality, atraumatic Eyes: EOMI  Neck: New honeycomb dressing in place with no noted blood underneath Lungs: clear to auscultation bilaterally Heart: regular rate and rhythm and S1, S2 normal Abdomen: Softer today with improved bowel movements and no palpable stool burden Extremities: RUE wrapped in dressing with edematous fingers noted  Skin: mobility and turgor normal Neurologic: Moves all 4 extremities, follows commands   Procedures:   11/18/20 Laminectomy/abscess evacuation  3/23 cervical epidural abscess> MSSA  11/20/2020 I& D of right shoulder by orthopedics  ETT 3/25 >>3/27  3/25 R forearm soft tissue abscess > MSSA  Data Reviewed: I have personally reviewed following labs and imaging studies Results for orders placed or performed during the hospital encounter of 11/18/20 (from the past 24 hour(s))  Glucose, capillary     Status: Abnormal   Collection Time: 11/26/20  3:55 PM  Result Value Ref Range   Glucose-Capillary 134 (H) 70 - 99 mg/dL  Glucose, capillary     Status: Abnormal   Collection Time: 11/26/20  9:56 PM  Result Value Ref Range   Glucose-Capillary 184 (H) 70 - 99 mg/dL  Basic metabolic panel     Status: Abnormal   Collection Time: 11/27/20  5:00 AM  Result Value Ref Range    Sodium 138 135 - 145 mmol/L   Potassium 3.3 (L) 3.5 - 5.1 mmol/L   Chloride 103 98 - 111 mmol/L   CO2 27 22 - 32 mmol/L   Glucose, Bld 157 (H) 70 - 99 mg/dL   BUN 14 8 - 23 mg/dL   Creatinine, Ser 1.61 0.61 - 1.24 mg/dL   Calcium 8.1 (L) 8.9 - 10.3 mg/dL   GFR, Estimated >09 >60 mL/min   Anion gap 8 5 - 15  CBC with Differential/Platelet     Status: Abnormal   Collection Time: 11/27/20  5:00 AM  Result Value Ref Range   WBC 21.3 (H) 4.0 - 10.5 K/uL   RBC 3.79 (L) 4.22 - 5.81 MIL/uL   Hemoglobin 11.7 (L) 13.0 - 17.0 g/dL   HCT 45.4 (L) 09.8 - 11.9 %   MCV 92.9 80.0 - 100.0 fL   MCH 30.9 26.0 - 34.0 pg   MCHC 33.2 30.0 - 36.0 g/dL   RDW 14.7 82.9 - 56.2 %   Platelets 507 (H) 150 - 400 K/uL   nRBC 0.0 0.0 - 0.2 %   Neutrophils Relative % 78 %   Neutro Abs 16.7 (H) 1.7 - 7.7 K/uL   Lymphocytes Relative 13 %   Lymphs Abs 2.7 0.7 - 4.0 K/uL   Monocytes Relative 7 %   Monocytes Absolute 1.5 (H) 0.1 - 1.0 K/uL   Eosinophils Relative 1 %   Eosinophils Absolute 0.1  0.0 - 0.5 K/uL   Basophils Relative 0 %   Basophils Absolute 0.1 0.0 - 0.1 K/uL   Immature Granulocytes 1 %   Abs Immature Granulocytes 0.20 (H) 0.00 - 0.07 K/uL  Magnesium     Status: None   Collection Time: 11/27/20  5:00 AM  Result Value Ref Range   Magnesium 1.9 1.7 - 2.4 mg/dL  Glucose, capillary     Status: Abnormal   Collection Time: 11/27/20  6:10 AM  Result Value Ref Range   Glucose-Capillary 162 (H) 70 - 99 mg/dL  Glucose, capillary     Status: Abnormal   Collection Time: 11/27/20 11:01 AM  Result Value Ref Range   Glucose-Capillary 120 (H) 70 - 99 mg/dL    Recent Results (from the past 240 hour(s))  SARS Coronavirus 2 by RT PCR (hospital order, performed in [facility] hospital lab) Nasopharyngeal Nasopharyngeal Swab     Status: None   Collection Time: 11/18/20  4:53 PM   Specimen: Nasopharyngeal Swab  Result Value Ref Range Status   SARS Coronavirus 2 NEGATIVE NEGATIVE Final    Comment:  (NOTE) SARS-CoV-2 target nucleic acids are NOT DETECTED.  The SARS-CoV-2 RNA is generally detectable in upper and lower respiratory specimens during the acute phase of infection. The lowest concentration of SARS-CoV-2 viral copies this assay can detect is 250 copies / mL. A negative result does not preclude SARS-CoV-2 infection and should not be used as the sole basis for treatment or other patient management decisions.  A negative result may occur with improper specimen collection / handling, submission of specimen other than nasopharyngeal swab, presence of viral mutation(s) within the areas targeted by this assay, and inadequate number of viral copies (<250 copies / mL). A negative result must be combined with clinical observations, patient history, and epidemiological information.  Fact Sheet for Patients:   BoilerBrush.com.cy  Fact Sheet for Healthcare Providers: https://pope.com/  This test is not yet approved or  cleared by the [place] FDA and has been authorized for detection and/or diagnosis of SARS-CoV-2 by FDA under an Emergency Use Authorization (EUA).  This EUA will remain in effect (meaning this test can be used) for the duration of the COVID-19 declaration under Section 564(b)(1) of the Act, 21 U.S.C. section 360bbb-3(b)(1), unless the authorization is terminated or revoked sooner.  Performed at [facility], [address] [place], [place] [address]   Aerobic/Anaerobic Culture w Gram Stain (surgical/deep wound)     Status: None   Collection Time: 11/18/20  7:06 PM   Specimen: Abscess  Result Value Ref Range Status   Specimen Description ABSCESS  Final   Special Requests CERVICAL  Final   Gram Stain   Final    FEW WBC PRESENT, PREDOMINANTLY PMN ABUNDANT GRAM POSITIVE COCCI    Culture   Final    FEW STAPHYLOCOCCUS AUREUS NO ANAEROBES ISOLATED Performed at [facility], [address]  [place], [place] [address]    Report Status 11/23/2020 FINAL  Final   Organism ID, Bacteria STAPHYLOCOCCUS AUREUS  Final      Susceptibility   Staphylococcus aureus - MIC*    CIPROFLOXACIN <=0.5 SENSITIVE Sensitive     ERYTHROMYCIN <=0.25 SENSITIVE Sensitive     GENTAMICIN <=0.5 SENSITIVE Sensitive     OXACILLIN <=0.25 SENSITIVE Sensitive     TETRACYCLINE <=1 SENSITIVE Sensitive     VANCOMYCIN <=0.5 SENSITIVE Sensitive     TRIMETH/SULFA <=10 SENSITIVE Sensitive     CLINDAMYCIN <=0.25  SENSITIVE Sensitive     RIFAMPIN <=0.5 SENSITIVE Sensitive     Inducible Clindamycin NEGATIVE Sensitive     * FEW STAPHYLOCOCCUS AUREUS  Culture, blood (routine x 2)     Status: None   Collection Time: 11/18/20  9:56 PM   Specimen: BLOOD  Result Value Ref Range Status   Specimen Description BLOOD SITE NOT SPECIFIED  Final   Special Requests   Final    BOTTLES DRAWN AEROBIC AND ANAEROBIC Blood Culture adequate volume   Culture   Final    NO GROWTH 5 DAYS Performed at [facility], [address] [place], [place] [address]    Report Status 11/23/2020 FINAL  Final  Culture, blood (routine x 2)     Status: None   Collection Time: 11/18/20 10:03 PM   Specimen: BLOOD  Result Value Ref Range Status   Specimen Description BLOOD SITE NOT SPECIFIED  Final   Special Requests   Final    BOTTLES DRAWN AEROBIC AND ANAEROBIC Blood Culture adequate volume   Culture   Final    NO GROWTH 5 DAYS Performed at [facility], [address], [place], [place] [address]    Report Status 11/23/2020 FINAL  Final  MRSA PCR Screening     Status: None   Collection Time: 11/19/20 12:46 AM   Specimen: Nasopharyngeal  Result Value Ref Range Status   MRSA by PCR NEGATIVE NEGATIVE Final    Comment:        The GeneXpert MRSA Assay (FDA approved for NASAL specimens only), is one component of a comprehensive MRSA colonization surveillance program. It is not intended to diagnose MRSA infection nor to guide  or monitor treatment for MRSA infections. Performed at [facility], [address], [place], [place] [address]   Aerobic/Anaerobic Culture w Gram Stain (surgical/deep wound)     Status: None   Collection Time: 11/20/20  6:53 PM   Specimen: PATH Cytology Misc. fluid; Body Fluid  Result Value Ref Range Status   Specimen Description WOUND RIGHT FOREARM  Final   Special Requests NONE  Final   Gram Stain   Final    FEW WBC PRESENT, PREDOMINANTLY PMN ABUNDANT GRAM POSITIVE COCCI    Culture   Final    ABUNDANT STAPHYLOCOCCUS AUREUS NO ANAEROBES ISOLATED Performed at [facility], [address] [place], [place] [address]    Report Status 11/25/2020 FINAL  Final   Organism ID, Bacteria STAPHYLOCOCCUS AUREUS  Final      Susceptibility   Staphylococcus aureus - MIC*    CIPROFLOXACIN <=0.5 SENSITIVE Sensitive     ERYTHROMYCIN <=0.25 SENSITIVE Sensitive     GENTAMICIN <=0.5 SENSITIVE Sensitive     OXACILLIN 0.5 SENSITIVE Sensitive     TETRACYCLINE <=1 SENSITIVE Sensitive     VANCOMYCIN <=0.5 SENSITIVE Sensitive     TRIMETH/SULFA <=10 SENSITIVE Sensitive     CLINDAMYCIN <=0.25 SENSITIVE Sensitive     RIFAMPIN <=0.5 SENSITIVE Sensitive     Inducible Clindamycin NEGATIVE Sensitive     * ABUNDANT STAPHYLOCOCCUS AUREUS  Fungus Culture With Stain     Status: None (Preliminary result)   Collection Time: 11/20/20  6:53 PM  Result Value Ref Range Status   Fungus Stain Final report  Final    Comment: (NOTE) Performed At: [facility] [address] [place], [place] [id number] Jolene Schimke MD MW:[id number]    Fungus (Mycology) Culture PENDING  Incomplete   Fungal Source WOUND  Final  Comment: RIGHT FOREARM Performed at [facility], [address] [place], [place] [address]   Fungus Culture Result     Status: None   Collection Time: 11/20/20  6:53 PM  Result Value Ref Range Status   Result 1 Comment  Final    Comment: (NOTE) KOH/Calcofluor preparation:   no fungus observed. Performed At: [facility] [address] [place], [place] [id number] Jolene SchimkeNagendra Sanjai MD XB:[id number]:[phone]      Radiology Studies: DG Abd Portable 1V  Result Date: 11/26/2020 CLINICAL DATA:  Abdominal pain, distention and constipation. EXAM: PORTABLE ABDOMEN - 1 VIEW COMPARISON:  None. FINDINGS: The bowel gas pattern is normal. Moderate amount of formed stool matter in the left colon. No radio-opaque calculi or other significant radiographic abnormality are seen. IMPRESSION: 1. Nonobstructive bowel gas pattern. 2. Moderate amount of formed stool matter in the left colon. Electronically Signed   By: Ted Mcalpineobrinka  Dimitrova M.D.   On: 11/26/2020 14:51   DG Abd Portable 1V  Final Result    US Abdomen Limited RUQ (LIVER/GB)  Final Result    US EKG SITE RITE  Final Result    MR THORACIC SPINE W WO CONTRAST  Final Result    DG Abd 1 View  Final Result    DG Abd 1 View  Final Result    DG Chest Port 1 View  Final Result    CT FOREARM RIGHT W CONTRAST  Final Result    VAS US UPPER EXTREMITY VENOUS DUPLEX  Final Result    DG CHEST PORT 1 VIEW  Final Result      Scheduled Meds: . acetaminophen  650 mg Oral Q4H  . amLODipine  10 mg Oral Daily  . Chlorhexidine Gluconate Cloth  6 each Topical Daily  . enoxaparin (LOVENOX) injection  40 mg Subcutaneous Q24H  . ferrous sulfate  325 mg Oral Q breakfast  . hydrochlorothiazide  25 mg Oral Daily  . insulin aspart  0-15 Units Subcutaneous TID WC  . insulin aspart  0-5 Units Subcutaneous QHS  . insulin detemir  10 Units Subcutaneous QHS  . lisinopril  40 mg Oral Daily  . liver oil-zinc oxide   Topical BID  . mouth rinse  15 mL Mouth Rinse BID  . metoprolol tartrate  50 mg Oral BID  . polyethylene glycol  17 g Oral Daily  . senna-docusate  1 tablet Oral BID  . sodium chloride flush  10-40 mL Intracatheter Q12H  . sodium chloride flush  10-40 mL Intracatheter Q12H  . sodium chloride flush  3 mL Intravenous  Q12H  . traMADol  100 mg Oral TID   PRN Meds: bisacodyl, hydrALAZINE, labetalol, menthol-cetylpyridinium **OR** phenol, ondansetron **OR** ondansetron (ZOFRAN) IV, oxyCODONE, sodium chloride flush, sodium chloride flush, sodium chloride flush, sodium phosphate Continuous Infusions: . sodium chloride Stopped (11/18/20 2307)  .  ceFAZolin (ANCEF) IV 2 g (11/27/20 0524)     LOS: 9 days  Time spent: Greater than 50% of the 35 minute visit was spent in counseling/coordination of care for the patient as laid out in the A&P.   Lewie Chamberavid Gari Hartsell, MD [facility] 11/27/2020, 12:49 PM

## 2020-11-28 ENCOUNTER — Encounter (HOSPITAL_COMMUNITY): Payer: Self-pay | Admitting: Physical Medicine and Rehabilitation

## 2020-11-28 ENCOUNTER — Inpatient Hospital Stay (HOSPITAL_COMMUNITY): Payer: Medicare Other

## 2020-11-28 ENCOUNTER — Inpatient Hospital Stay (HOSPITAL_COMMUNITY)
Admission: RE | Admit: 2020-11-28 | Discharge: 2021-01-01 | DRG: 094 | Disposition: A | Discharge status: Home Health | Payer: Medicare Other | Source: Intra-hospital | Attending: Physical Medicine and Rehabilitation | Admitting: Physical Medicine and Rehabilitation

## 2020-11-28 ENCOUNTER — Other Ambulatory Visit: Payer: Self-pay

## 2020-11-28 DIAGNOSIS — Z9889 Other specified postprocedural states: Secondary | ICD-10-CM

## 2020-11-28 DIAGNOSIS — N319 Neuromuscular dysfunction of bladder, unspecified: Secondary | ICD-10-CM | POA: Diagnosis present

## 2020-11-28 DIAGNOSIS — E43 Unspecified severe protein-calorie malnutrition: Secondary | ICD-10-CM | POA: Insufficient documentation

## 2020-11-28 DIAGNOSIS — I951 Orthostatic hypotension: Secondary | ICD-10-CM | POA: Diagnosis present

## 2020-11-28 DIAGNOSIS — I152 Hypertension secondary to endocrine disorders: Secondary | ICD-10-CM | POA: Diagnosis present

## 2020-11-28 DIAGNOSIS — E1165 Type 2 diabetes mellitus with hyperglycemia: Secondary | ICD-10-CM | POA: Diagnosis present

## 2020-11-28 DIAGNOSIS — G062 Extradural and subdural abscess, unspecified: Secondary | ICD-10-CM | POA: Diagnosis present

## 2020-11-28 DIAGNOSIS — J9811 Atelectasis: Secondary | ICD-10-CM | POA: Diagnosis not present

## 2020-11-28 DIAGNOSIS — I351 Nonrheumatic aortic (valve) insufficiency: Secondary | ICD-10-CM | POA: Diagnosis present

## 2020-11-28 DIAGNOSIS — E1169 Type 2 diabetes mellitus with other specified complication: Secondary | ICD-10-CM | POA: Diagnosis not present

## 2020-11-28 DIAGNOSIS — G8250 Quadriplegia, unspecified: Secondary | ICD-10-CM | POA: Diagnosis present

## 2020-11-28 DIAGNOSIS — K5901 Slow transit constipation: Secondary | ICD-10-CM | POA: Diagnosis not present

## 2020-11-28 DIAGNOSIS — M60031 Infective myositis, right forearm: Secondary | ICD-10-CM | POA: Diagnosis not present

## 2020-11-28 DIAGNOSIS — K592 Neurogenic bowel, not elsewhere classified: Secondary | ICD-10-CM | POA: Diagnosis present

## 2020-11-28 DIAGNOSIS — I82461 Acute embolism and thrombosis of right calf muscular vein: Secondary | ICD-10-CM | POA: Diagnosis present

## 2020-11-28 DIAGNOSIS — Z79899 Other long term (current) drug therapy: Secondary | ICD-10-CM

## 2020-11-28 DIAGNOSIS — K9189 Other postprocedural complications and disorders of digestive system: Secondary | ICD-10-CM

## 2020-11-28 DIAGNOSIS — K567 Ileus, unspecified: Secondary | ICD-10-CM | POA: Diagnosis not present

## 2020-11-28 DIAGNOSIS — R52 Pain, unspecified: Secondary | ICD-10-CM | POA: Diagnosis not present

## 2020-11-28 DIAGNOSIS — I82451 Acute embolism and thrombosis of right peroneal vein: Secondary | ICD-10-CM | POA: Diagnosis present

## 2020-11-28 DIAGNOSIS — Z7984 Long term (current) use of oral hypoglycemic drugs: Secondary | ICD-10-CM | POA: Diagnosis not present

## 2020-11-28 DIAGNOSIS — G959 Disease of spinal cord, unspecified: Secondary | ICD-10-CM | POA: Diagnosis not present

## 2020-11-28 DIAGNOSIS — G061 Intraspinal abscess and granuloma: Principal | ICD-10-CM | POA: Diagnosis present

## 2020-11-28 DIAGNOSIS — H9190 Unspecified hearing loss, unspecified ear: Secondary | ICD-10-CM | POA: Diagnosis present

## 2020-11-28 DIAGNOSIS — G9589 Other specified diseases of spinal cord: Secondary | ICD-10-CM | POA: Diagnosis present

## 2020-11-28 DIAGNOSIS — E876 Hypokalemia: Secondary | ICD-10-CM | POA: Diagnosis not present

## 2020-11-28 DIAGNOSIS — R109 Unspecified abdominal pain: Secondary | ICD-10-CM

## 2020-11-28 DIAGNOSIS — R112 Nausea with vomiting, unspecified: Secondary | ICD-10-CM

## 2020-11-28 DIAGNOSIS — F419 Anxiety disorder, unspecified: Secondary | ICD-10-CM | POA: Diagnosis present

## 2020-11-28 DIAGNOSIS — B9561 Methicillin susceptible Staphylococcus aureus infection as the cause of diseases classified elsewhere: Secondary | ICD-10-CM | POA: Diagnosis present

## 2020-11-28 DIAGNOSIS — Z6826 Body mass index (BMI) 26.0-26.9, adult: Secondary | ICD-10-CM

## 2020-11-28 DIAGNOSIS — G9763 Postprocedural seroma of a nervous system organ or structure following a nervous system procedure: Secondary | ICD-10-CM | POA: Diagnosis not present

## 2020-11-28 DIAGNOSIS — E11649 Type 2 diabetes mellitus with hypoglycemia without coma: Secondary | ICD-10-CM | POA: Diagnosis not present

## 2020-11-28 DIAGNOSIS — L02413 Cutaneous abscess of right upper limb: Secondary | ICD-10-CM | POA: Diagnosis present

## 2020-11-28 DIAGNOSIS — E1159 Type 2 diabetes mellitus with other circulatory complications: Secondary | ICD-10-CM | POA: Diagnosis not present

## 2020-11-28 DIAGNOSIS — R110 Nausea: Secondary | ICD-10-CM

## 2020-11-28 DIAGNOSIS — K56609 Unspecified intestinal obstruction, unspecified as to partial versus complete obstruction: Secondary | ICD-10-CM

## 2020-11-28 LAB — MAGNESIUM: Magnesium: 2 mg/dL (ref 1.7–2.4)

## 2020-11-28 LAB — GLUCOSE, CAPILLARY
Glucose-Capillary: 109 mg/dL — ABNORMAL HIGH (ref 70–99)
Glucose-Capillary: 161 mg/dL — ABNORMAL HIGH (ref 70–99)
Glucose-Capillary: 145 mg/dL — ABNORMAL HIGH (ref 70–99)
Glucose-Capillary: 194 mg/dL — ABNORMAL HIGH (ref 70–99)

## 2020-11-28 LAB — CBC WITH DIFFERENTIAL/PLATELET
Abs Immature Granulocytes: 0.18 10*3/uL — ABNORMAL HIGH (ref 0.00–0.07)
Basophils Absolute: 0.1 10*3/uL (ref 0.0–0.1)
Basophils Relative: 0 %
Eosinophils Absolute: 0.1 10*3/uL (ref 0.0–0.5)
Eosinophils Relative: 1 %
HCT: 35.2 % — ABNORMAL LOW (ref 39.0–52.0)
Hemoglobin: 11.8 g/dL — ABNORMAL LOW (ref 13.0–17.0)
Immature Granulocytes: 1 %
Lymphocytes Relative: 14 %
Lymphs Abs: 2.8 10*3/uL (ref 0.7–4.0)
MCH: 31.2 pg (ref 26.0–34.0)
MCHC: 33.5 g/dL (ref 30.0–36.0)
MCV: 93.1 fL (ref 80.0–100.0)
Monocytes Absolute: 1.3 10*3/uL — ABNORMAL HIGH (ref 0.1–1.0)
Monocytes Relative: 6 %
Neutro Abs: 16.2 10*3/uL — ABNORMAL HIGH (ref 1.7–7.7)
Neutrophils Relative %: 78 %
Platelets: 511 10*3/uL — ABNORMAL HIGH (ref 150–400)
RBC: 3.78 MIL/uL — ABNORMAL LOW (ref 4.22–5.81)
RDW: 13 % (ref 11.5–15.5)
WBC: 20.8 10*3/uL — ABNORMAL HIGH (ref 4.0–10.5)
nRBC: 0 % (ref 0.0–0.2)

## 2020-11-28 LAB — BASIC METABOLIC PANEL
Anion gap: 9 (ref 5–15)
BUN: 11 mg/dL (ref 8–23)
CO2: 26 mmol/L (ref 22–32)
Calcium: 8.1 mg/dL — ABNORMAL LOW (ref 8.9–10.3)
Chloride: 98 mmol/L (ref 98–111)
Creatinine, Ser: 0.67 mg/dL (ref 0.61–1.24)
GFR, Estimated: >60 mL/min (ref >60)
Glucose, Bld: 113 mg/dL — ABNORMAL HIGH (ref 70–99)
Potassium: 3.2 mmol/L — ABNORMAL LOW (ref 3.5–5.1)
Sodium: 133 mmol/L — ABNORMAL LOW (ref 135–145)

## 2020-11-28 MED ORDER — ZINC OXIDE 40 % EX OINT
TOPICAL_OINTMENT | Freq: Two times a day (BID) | CUTANEOUS | Status: DC
  Administered 2020-12-01 – 2020-12-31 (×7): 1 via TOPICAL
  Filled 2020-11-28 (×2): qty 57

## 2020-11-28 MED ORDER — ONDANSETRON HCL 4 MG/2ML IJ SOLN
4.0000 mg | Freq: Four times a day (QID) | INTRAMUSCULAR | Status: DC | PRN
  Administered 2020-12-08 – 2020-12-28 (×9): 4 mg via INTRAVENOUS
  Filled 2020-11-28 (×9): qty 2

## 2020-11-28 MED ORDER — OXYCODONE HCL 5 MG PO TABS
5.0000 mg | ORAL_TABLET | ORAL | Status: DC | PRN
  Filled 2020-11-28: qty 1

## 2020-11-28 MED ORDER — INSULIN ASPART 100 UNIT/ML ~~LOC~~ SOLN
0.0000 [IU] | Freq: Three times a day (TID) | SUBCUTANEOUS | Status: DC
  Administered 2020-11-28 – 2020-11-29 (×4): 2 [IU] via SUBCUTANEOUS
  Administered 2020-11-30 (×2): 3 [IU] via SUBCUTANEOUS
  Administered 2020-12-01: 5 [IU] via SUBCUTANEOUS
  Administered 2020-12-01: 3 [IU] via SUBCUTANEOUS
  Administered 2020-12-01: 2 [IU] via SUBCUTANEOUS
  Administered 2020-12-02 – 2020-12-05 (×12): 3 [IU] via SUBCUTANEOUS
  Administered 2020-12-06: 2 [IU] via SUBCUTANEOUS
  Administered 2020-12-06 – 2020-12-07 (×3): 3 [IU] via SUBCUTANEOUS
  Administered 2020-12-07 – 2020-12-08 (×4): 2 [IU] via SUBCUTANEOUS
  Administered 2020-12-10: 3 [IU] via SUBCUTANEOUS
  Administered 2020-12-10 – 2020-12-13 (×5): 2 [IU] via SUBCUTANEOUS
  Administered 2020-12-14: 3 [IU] via SUBCUTANEOUS
  Administered 2020-12-15: 2 [IU] via SUBCUTANEOUS
  Administered 2020-12-15: 3 [IU] via SUBCUTANEOUS
  Administered 2020-12-15 – 2020-12-18 (×7): 2 [IU] via SUBCUTANEOUS
  Administered 2020-12-18 (×2): 3 [IU] via SUBCUTANEOUS
  Administered 2020-12-19 (×2): 2 [IU] via SUBCUTANEOUS
  Administered 2020-12-20: 3 [IU] via SUBCUTANEOUS
  Administered 2020-12-20 – 2020-12-24 (×4): 2 [IU] via SUBCUTANEOUS
  Administered 2020-12-25: 3 [IU] via SUBCUTANEOUS
  Administered 2020-12-25 – 2020-12-27 (×3): 2 [IU] via SUBCUTANEOUS
  Administered 2020-12-27: 3 [IU] via SUBCUTANEOUS
  Administered 2020-12-27 – 2020-12-29 (×4): 2 [IU] via SUBCUTANEOUS
  Administered 2020-12-30: 3 [IU] via SUBCUTANEOUS
  Administered 2020-12-30: 2 [IU] via SUBCUTANEOUS
  Administered 2020-12-31: 5 [IU] via SUBCUTANEOUS
  Administered 2020-12-31 – 2021-01-01 (×2): 2 [IU] via SUBCUTANEOUS

## 2020-11-28 MED ORDER — APIXABAN 5 MG PO TABS: 5.0000 mg | ORAL_TABLET | Freq: Two times a day (BID) | ORAL | Status: DC

## 2020-11-28 MED ORDER — HYDROCHLOROTHIAZIDE 25 MG PO TABS
25.0000 mg | ORAL_TABLET | Freq: Every day | ORAL | Status: DC
  Administered 2020-11-29 – 2020-12-03 (×5): 25 mg via ORAL
  Filled 2020-11-28 (×5): qty 1

## 2020-11-28 MED ORDER — TRAMADOL HCL 50 MG PO TABS
100.0000 mg | ORAL_TABLET | Freq: Three times a day (TID) | ORAL | Status: DC
  Administered 2020-11-28: 100 mg via ORAL
  Filled 2020-11-28: qty 2

## 2020-11-28 MED ORDER — BISACODYL 10 MG RE SUPP
10.0000 mg | Freq: Every day | RECTAL | Status: DC | PRN
  Administered 2020-12-19: 10 mg via RECTAL
  Filled 2020-11-28: qty 1

## 2020-11-28 MED ORDER — ONDANSETRON HCL 4 MG PO TABS
4.0000 mg | ORAL_TABLET | Freq: Four times a day (QID) | ORAL | Status: DC | PRN
  Administered 2020-12-10 – 2020-12-30 (×4): 4 mg via ORAL
  Filled 2020-11-28 (×4): qty 1

## 2020-11-28 MED ORDER — MORPHINE SULFATE ER 15 MG PO TBCR
15.0000 mg | EXTENDED_RELEASE_TABLET | Freq: Two times a day (BID) | ORAL | Status: DC
  Administered 2020-11-28 – 2020-12-03 (×11): 15 mg via ORAL
  Filled 2020-11-28 (×11): qty 1

## 2020-11-28 MED ORDER — POLYETHYLENE GLYCOL 3350 17 G PO PACK
17.0000 g | PACK | Freq: Every day | ORAL | Status: DC
  Administered 2020-11-29 – 2020-12-21 (×21): 17 g via ORAL
  Filled 2020-11-28 (×23): qty 1

## 2020-11-28 MED ORDER — ENOXAPARIN SODIUM 40 MG/0.4ML ~~LOC~~ SOLN: 40.0000 mg | SUBCUTANEOUS | Status: DC

## 2020-11-28 MED ORDER — FERROUS SULFATE 325 (65 FE) MG PO TABS
325.0000 mg | ORAL_TABLET | Freq: Every day | ORAL | Status: DC
  Administered 2020-11-29 – 2020-12-14 (×15): 325 mg via ORAL
  Filled 2020-11-28 (×16): qty 1

## 2020-11-28 MED ORDER — METOPROLOL TARTRATE 50 MG PO TABS
50.0000 mg | ORAL_TABLET | Freq: Two times a day (BID) | ORAL | Status: DC
  Administered 2020-11-28 – 2021-01-01 (×63): 50 mg via ORAL
  Filled 2020-11-28 (×68): qty 1

## 2020-11-28 MED ORDER — POTASSIUM CHLORIDE CRYS ER 20 MEQ PO TBCR
40.0000 meq | EXTENDED_RELEASE_TABLET | Freq: Once | ORAL | Status: AC
  Administered 2020-11-28: 40 meq via ORAL
  Filled 2020-11-28: qty 2

## 2020-11-28 MED ORDER — APIXABAN 5 MG PO TABS
10.0000 mg | ORAL_TABLET | Freq: Two times a day (BID) | ORAL | Status: DC
  Administered 2020-11-28 – 2020-11-29 (×2): 10 mg via ORAL
  Filled 2020-11-28 (×2): qty 2

## 2020-11-28 MED ORDER — INSULIN DETEMIR 100 UNIT/ML ~~LOC~~ SOLN
10.0000 [IU] | Freq: Every day | SUBCUTANEOUS | Status: DC
  Administered 2020-11-28 – 2020-12-01 (×4): 10 [IU] via SUBCUTANEOUS
  Filled 2020-11-28 (×5): qty 0.1

## 2020-11-28 MED ORDER — ACETAMINOPHEN 325 MG PO TABS
650.0000 mg | ORAL_TABLET | ORAL | Status: DC
  Administered 2020-11-28 – 2021-01-01 (×149): 650 mg via ORAL
  Filled 2020-11-28 (×163): qty 2

## 2020-11-28 MED ORDER — SENNOSIDES-DOCUSATE SODIUM 8.6-50 MG PO TABS
1.0000 | ORAL_TABLET | Freq: Two times a day (BID) | ORAL | Status: DC
  Administered 2020-11-28 – 2020-12-21 (×42): 1 via ORAL
  Filled 2020-11-28 (×46): qty 1

## 2020-11-28 MED ORDER — CEFAZOLIN SODIUM-DEXTROSE 2-4 GM/100ML-% IV SOLN
2.0000 g | Freq: Three times a day (TID) | INTRAVENOUS | Status: DC
  Administered 2020-11-28 – 2020-12-29 (×93): 2 g via INTRAVENOUS
  Filled 2020-11-28 (×96): qty 100

## 2020-11-28 MED ORDER — LISINOPRIL 20 MG PO TABS
40.0000 mg | ORAL_TABLET | Freq: Every day | ORAL | Status: DC
  Administered 2020-11-29 – 2020-12-04 (×5): 40 mg via ORAL
  Filled 2020-11-28 (×6): qty 2

## 2020-11-28 MED ORDER — AMLODIPINE BESYLATE 10 MG PO TABS
10.0000 mg | ORAL_TABLET | Freq: Every day | ORAL | Status: DC
  Administered 2020-11-29 – 2020-12-02 (×4): 10 mg via ORAL
  Filled 2020-11-28 (×5): qty 1

## 2020-11-28 NOTE — Plan of Care (Signed)

## 2020-11-28 NOTE — Progress Notes (Signed)
Lower extremity venous bilateral study completed.  Preliminary results relayed to Montel Clock, RN.   See CV Proc for preliminary results report.   Jean Rosenthal, RDMS, RVT

## 2020-11-28 NOTE — Plan of Care (Signed)
Pt arrived to unit via bed by nursing staff. Pt in no distress, breathing even and unlabored, no SOB noted. Alert and oriented x3-4, Head to toe assessment completed. Son notified of pt move to CIR. No other issues noted at this time

## 2020-11-28 NOTE — Progress Notes (Signed)
MD Lovorn notified of pt Lower extremity ultrasound results completed via phone call.

## 2020-11-28 NOTE — Progress Notes (Signed)
Inpatient Rehabilitation Medication Review by a Pharmacist  A complete drug regimen review was completed for this patient to identify any potential clinically significant medication issues.  Clinically significant medication issues were identified:  yes, now resolved   Type of Medication Issue Identified Description of Issue Urgent (address now) Non-Urgent (address on AM team rounds) Plan Plan Accepted by Provider? (Yes / No / Pending AM Rounds)  Drug Interaction(s) (clinically significant)       Duplicate Therapy  Enoxaparin 40 daily duplicate orders Non-urgent Duplicate order discontinued NA  Allergy       No Medication Administration End Date       Incorrect Dose       Additional Drug Therapy Needed  Home meds not resumed yet: metformin 1000 mg BID, hydrocodone/APAP 5/325 q6h prn, benadryl prn for allergies Non-urgent Consider resuming metformin when able.  NA  Other          Pharmacist comments: NA  Time spent performing this drug regimen review (minutes):  15 min  Daniel Cline, PharmD PGY-1 Pharmacy Resident 11/28/2020 2:39 PM Please see AMION for all pharmacy numbers

## 2020-11-28 NOTE — Progress Notes (Signed)
He appears to be stable.  He denies pain.  He is being fed at this time.  Continue current management.

## 2020-11-29 ENCOUNTER — Inpatient Hospital Stay (HOSPITAL_COMMUNITY): Payer: Medicare Other | Admitting: Anesthesiology

## 2020-11-29 ENCOUNTER — Inpatient Hospital Stay (HOSPITAL_COMMUNITY): Payer: Medicare Other

## 2020-11-29 ENCOUNTER — Encounter (HOSPITAL_COMMUNITY)
Admission: RE | Disposition: A | Discharge status: Home Health | Payer: Self-pay | Source: Intra-hospital | Attending: Physical Medicine and Rehabilitation

## 2020-11-29 DIAGNOSIS — R52 Pain, unspecified: Secondary | ICD-10-CM | POA: Diagnosis not present

## 2020-11-29 DIAGNOSIS — N319 Neuromuscular dysfunction of bladder, unspecified: Secondary | ICD-10-CM

## 2020-11-29 DIAGNOSIS — G959 Disease of spinal cord, unspecified: Secondary | ICD-10-CM

## 2020-11-29 DIAGNOSIS — G062 Extradural and subdural abscess, unspecified: Secondary | ICD-10-CM

## 2020-11-29 DIAGNOSIS — K592 Neurogenic bowel, not elsewhere classified: Secondary | ICD-10-CM | POA: Diagnosis not present

## 2020-11-29 LAB — GLUCOSE, CAPILLARY
Glucose-Capillary: 139 mg/dL — ABNORMAL HIGH (ref 70–99)
Glucose-Capillary: 142 mg/dL — ABNORMAL HIGH (ref 70–99)
Glucose-Capillary: 115 mg/dL — ABNORMAL HIGH (ref 70–99)
Glucose-Capillary: 122 mg/dL — ABNORMAL HIGH (ref 70–99)
Glucose-Capillary: 183 mg/dL — ABNORMAL HIGH (ref 70–99)

## 2020-11-29 IMAGING — CT CT CERVICAL SPINE W/O CM
4 series · 15 of 33 positions shown, 18 images · non-contrast
Comparison: MRI cervical spine [DATE].

CLINICAL DATA: Epidural abscess.

EXAM:
CT CERVICAL SPINE WITHOUT CONTRAST
TECHNIQUE: Multidetector CT imaging of the cervical spine was performed without
intravenous contrast. Multiplanar CT image reconstructions were also
generated.

[Series 5: c spine soft · axial · 0.32mm/px · z∈[-242,-212]mm · 2 of 92 slices shown]
[im 16/92  soft-tissue]
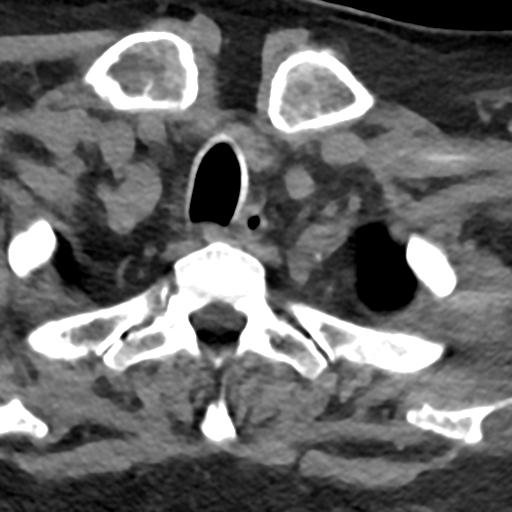
[im 31/92  soft-tissue]
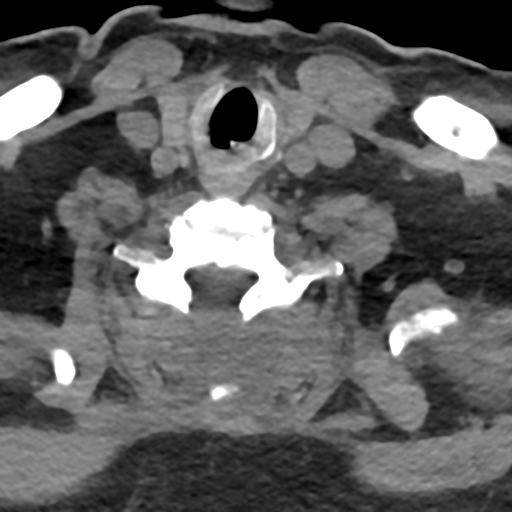

[Series 8: sag bone · sagittal · 0.32mm/px · 5 of 79 slices shown, 6 images]
[im 27/79  bone]
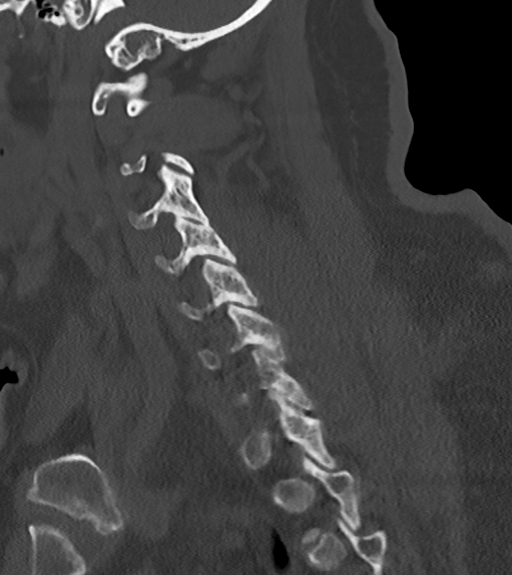
[im 33/79  bone]
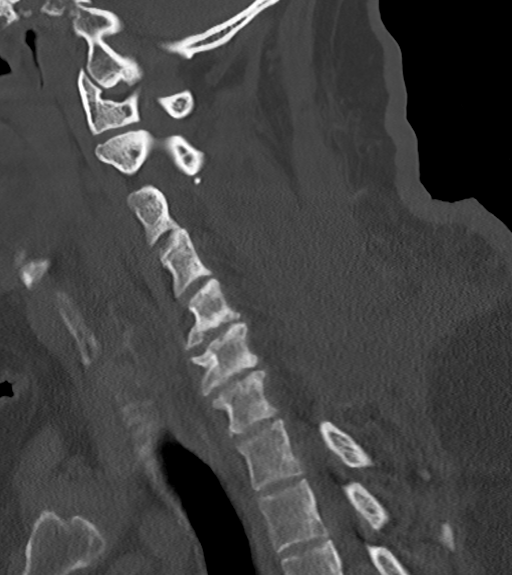
[im 40/79  soft-tissue]
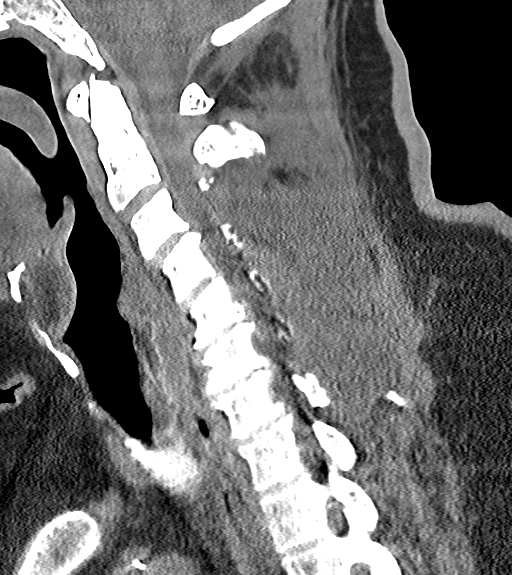
[im 40/79  bone]
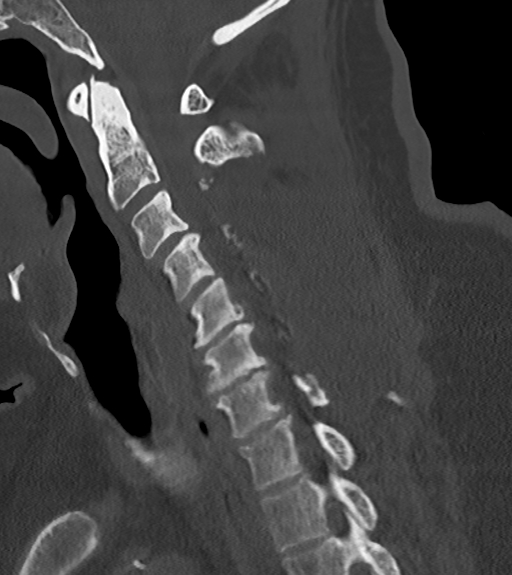
[im 46/79  bone]
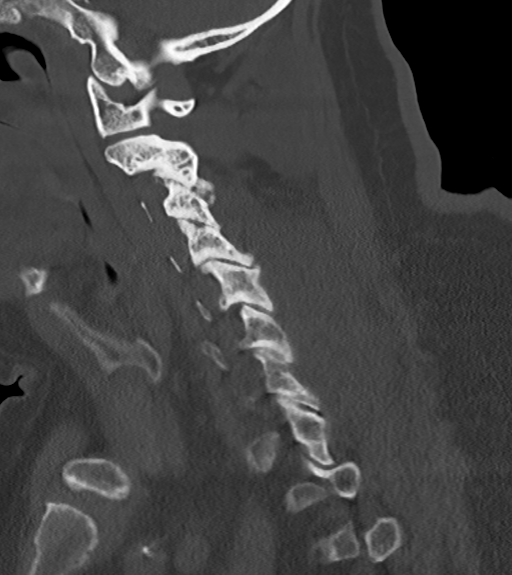
[im 53/79  bone]
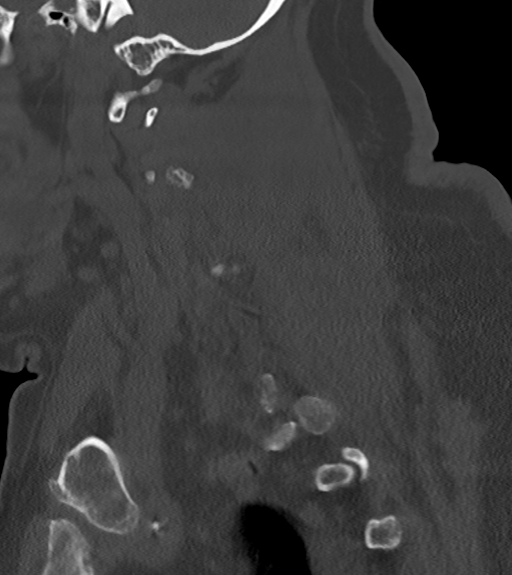

[Series 9: cor bone · coronal · 0.30mm/px · 3 of 83 slices shown]
[im 17/83  bone]
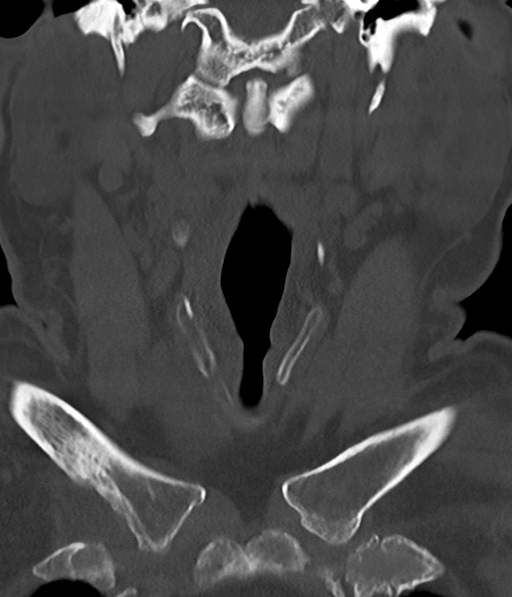
[im 33/83  bone]
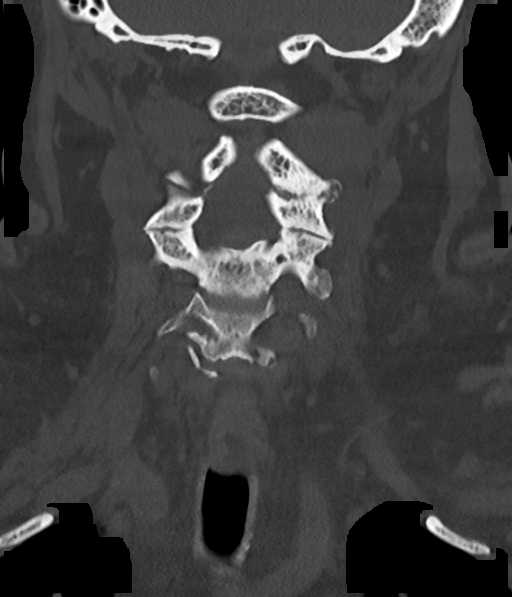
[im 50/83  bone]
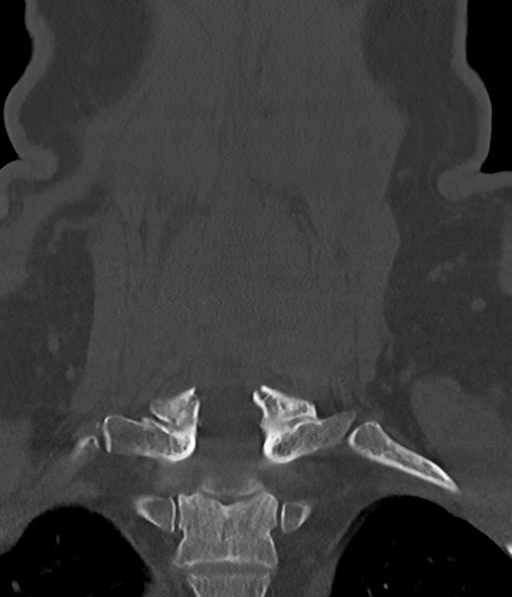

[Series 10: orthogonal axials · axial · 0.21mm/px · z∈[-256,-149]mm · 5 of 96 slices shown, 7 images]
[im 16/96  soft-tissue]
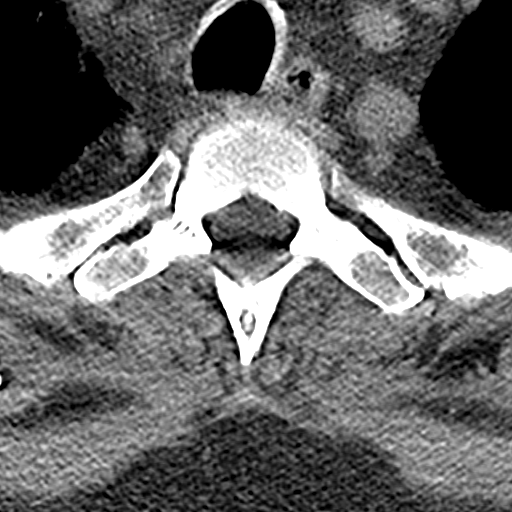
[im 16/96  bone]
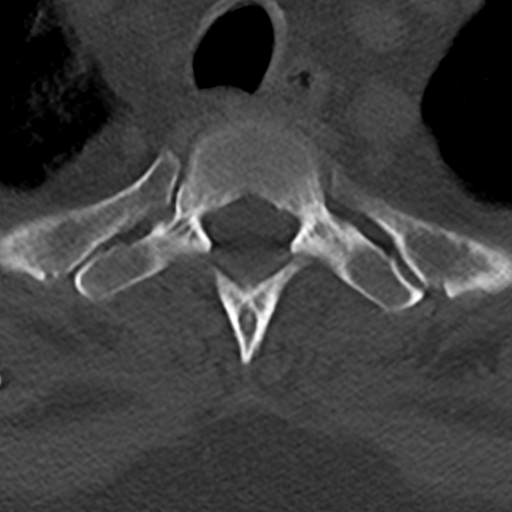
[im 32/96  bone]
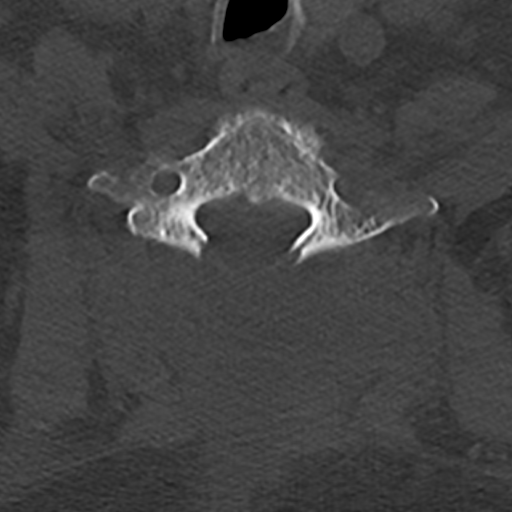
[im 48/96  bone]
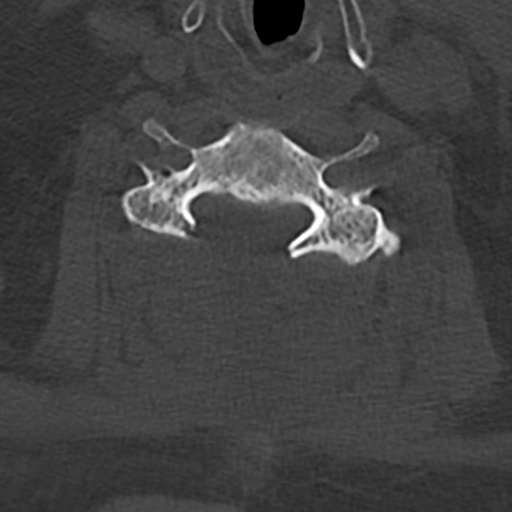
[im 64/96  bone]
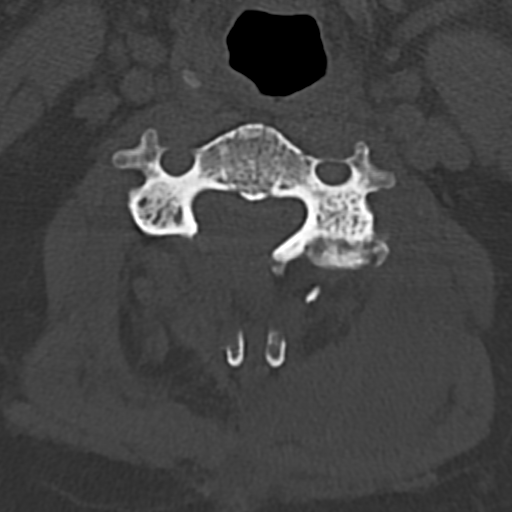
[im 80/96  soft-tissue]
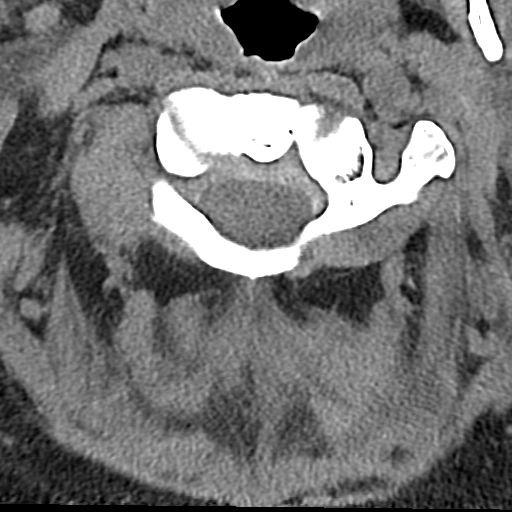
[im 80/96  bone]
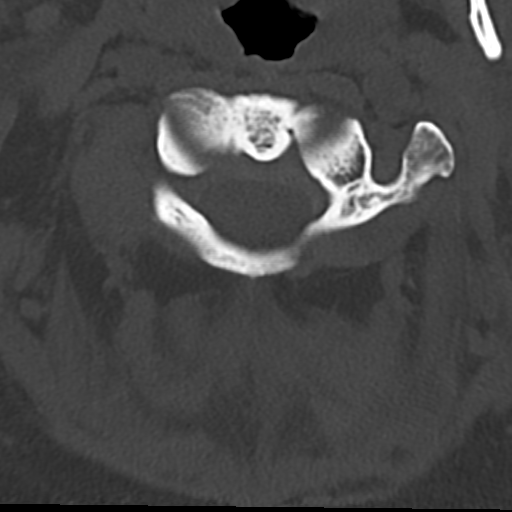

[15 of 33 positions shown; findings below may reference images not displayed]

FINDINGS: Alignment: No significant listhesis is present. Straightening of the
normal cervical lordosis again noted.

Skull base and vertebrae: Craniocervical junction normal. Wide
laminectomy noted C3-C7. Facet alignment remains.

Soft tissues and spinal canal: Persistent heterogeneous fluid
collections are present the left paraspinous musculature. Central
fluid collection now present dorsal to the thecal sac along the
surgical tract.

Disc levels: Leftward disc osteophyte complex is again seen at L3-4
and L4-5. Foraminal narrowing stable.

Upper chest: Bilateral pleural effusions are present. No
pneumothorax present. Left-sided PICC line noted.
IMPRESSION: 1. Persistent heterogeneous fluid collections in the left
paraspinous musculature concerning for infection.
2. Decompression of the spinal canal with laminectomies C3 through
C7.
3. Central fluid collection now present dorsal to the thecal sac
along the surgical tract.
4. Stable multilevel degenerative changes of the cervical spine.
Bilateral foraminal narrowing is stable.
5. Bilateral pleural effusions.

## 2020-11-29 SURGERY — WOUND EXPLORATION
Anesthesia: General

## 2020-11-29 MED ORDER — BISACODYL 10 MG RE SUPP
10.0000 mg | Freq: Every day | RECTAL | Status: DC
  Administered 2020-11-29 – 2020-12-31 (×20): 10 mg via RECTAL
  Filled 2020-11-29 (×27): qty 1

## 2020-11-29 MED ORDER — OXYCODONE HCL 5 MG PO TABS
5.0000 mg | ORAL_TABLET | ORAL | Status: DC | PRN
  Administered 2020-11-29 – 2020-11-30 (×4): 10 mg via ORAL
  Administered 2020-12-01: 5 mg via ORAL
  Administered 2020-12-01 – 2020-12-18 (×7): 10 mg via ORAL
  Administered 2020-12-19: 5 mg via ORAL
  Filled 2020-11-29 (×2): qty 2
  Filled 2020-11-29 (×2): qty 1
  Filled 2020-11-29 (×10): qty 2
  Filled 2020-11-29: qty 1

## 2020-11-29 MED ORDER — SODIUM CHLORIDE 0.9% FLUSH
10.0000 mL | INTRAVENOUS | Status: DC | PRN
  Administered 2020-12-03 – 2020-12-25 (×4): 10 mL

## 2020-11-29 MED ORDER — HEPARIN (PORCINE) 25000 UT/250ML-% IV SOLN
1450.0000 [IU]/h | INTRAVENOUS | Status: DC
  Administered 2020-11-29: 1300 [IU]/h via INTRAVENOUS
  Filled 2020-11-29: qty 250

## 2020-11-29 MED ORDER — CHLORHEXIDINE GLUCONATE CLOTH 2 % EX PADS
6.0000 | MEDICATED_PAD | Freq: Every day | CUTANEOUS | Status: DC
  Administered 2020-11-29 – 2020-12-07 (×7): 6 via TOPICAL

## 2020-11-29 MED ORDER — TAMSULOSIN HCL 0.4 MG PO CAPS
0.4000 mg | ORAL_CAPSULE | Freq: Every day | ORAL | Status: DC
  Administered 2020-11-29 – 2020-12-02 (×4): 0.4 mg via ORAL
  Filled 2020-11-29 (×5): qty 1

## 2020-11-29 NOTE — Progress Notes (Signed)
   Providing Compassionate, Quality Care - Together     Patient received Eliquis 10 mg last night and this morning for newly diagnosed DVT. I and D will be pushed back as patient would be high risk for developing an epidural hematoma. Eliquis discontinued until after procedure. Heparin gtt to start at 8 pm this evening (11/29/2020). Patient is fine to eat this evening, but would make NPO at midnight until Dr. Jordan Likes can evaluate in the morning.    Val Eagle, DNP, AGNP-C Nurse Practitioner 11/29/2020 2:55 PM  [facility] [address], [place], [place] [address] P: [phone]    F: [phone]

## 2020-11-29 NOTE — Progress Notes (Signed)
We were called to evaluate the patient's wound is he is started to drain copious amounts of fluid.  There was concern that this might be CSF.  The Patient does complain of headaches and neck pain.  However, there is copious amounts of exudative purulent serosanguineous drainage that I do not believe contains CSF as best I can tell.  I did drain copious amounts of the fluid.  I think he needs a posterior cervical wound exploration with repeat washout and placement of a drain.  Unfortunately, he just ate an hour ago and this is not a true emergency and therefore he needs to wait 5 or 6 hours to have surgery.  I go off call at 3:00 and therefore called Dr. Lovell Sheehan who comes on call and he is going to do the washout at 4 PM this afternoon.  Patient is already on Ancef.  He understands the risk of this include but are not limited to bleeding, infection, CSF leak, spinal cord injury, nerve injury, numbness, paralysis, weakness, lack of relief of symptoms, worsening symptoms, need for further surgery, and anesthesia risk.  He agrees to proceed.  He will then be readmitted to Dr. Jordan Likes.

## 2020-11-29 NOTE — Progress Notes (Signed)
Informed by OR that procedure is canceled for today and pt it to go tomorrow instead. Dr. Berline Chough is aware. Pt is resting in bed at this time.   Marylu Lund, RN

## 2020-11-29 NOTE — Progress Notes (Signed)
Pt. PVR AT 0630 was . Pt. Refused I/o cath. Educated pt. and the risk involved in refusing. Pt. Stated he will try to void again and will then accept the I/o cath. Passed on to next shift.

## 2020-11-29 NOTE — Progress Notes (Addendum)
Saw patient this morning-Per nurse his drainage from his cervical posterior incision is much worse than it was yesterday. He has not sat up in bed more than 30 degrees for at least 2 days-he reports he is too scared because of pain, he notes that the worst pain he is ever had in his life, and the addition of MS Contin has made no difference to his pain control.  On exam patient has saturated the pillow under his head in addition to his dressing, as well as the sheet underneath the pillow is saturated, and the thick pad under his shoulders down is also saturated on the top.  The drainage is serosanguineous-and slightly thick.  He notes that he gets the worst headache of his life when I tried to set him up at 30 degrees, and started crying and yelling.  He also notes he did not sleep well because of that pounding headache that got worse with sitting up as well as his overall pain in his neck, shoulders, and head.  Of note, patient's white count on 4/2 was 21,000-up over the last 2 days from 18,000.  I called neurosurgery and asked them to see the patient-they agreed he needs to go to the operating room today to have an I&D, and a washout. He will be placed on Dr. Lindalou Hose service-although it sounds like Dr. Lovell Sheehan is on-call and will be doing the surgery. I will make him n.p.o. as of this moment.  Of note I also started a bowel program and Flomax for his urinary retention.  He also was started on apixaban last night, for his acute DVT that was diagnosed in his Soleus and peroneal.  I have discussed this with the neurosurgical midlevel-name Kim, and patient will not be coming back to rehab right after surgery.  Addendum- pt going to OR tomorrow, not today due to being started on Apixiban- now hold and start IV heparin. Starting IV gtt heparin. D/c and readmit orders placed, and modified d/c order to tomorrow.

## 2020-11-29 NOTE — Progress Notes (Signed)
Physical Therapy Note  Patient Details  Name: Daniel Cline MRN: [id number] Date of Birth: 1956-05-10 Today's Date: 11/29/2020    PT received order and attempted to initial eval. In brief, pt is not appropriate for PT at this time and is placed on medical HOLD. Please re-consult when pt is appropriate for therapy services.  Session details:  Chart reviewed. Pt was received semi-reclined in bed and agreeable to therapy. Pt c/o 9/10 aching pain in posterolateral head and neck radiating into R shoulder, forearm and hand. Pt was able to complete gross LE strength assessment in bed demonstrating gross 3-/5 strength in BLE., and initiation of bed mobility, completing L rolling with ModA x2.   Once pt positioned on L side, PT noted significant draining at surgical site. RN notified and called to room. Upon RN assessment, MD was notified and entered room for examination. Per assessment, pt is not appropriate for therapy at this time and is placed on a medical hold.  Perrin Maltese, PT, DPT 11/29/2020, 10:00 AM

## 2020-11-29 NOTE — Plan of Care (Signed)
  Problem: Consults Goal: RH SPINAL CORD INJURY PATIENT EDUCATION Description:  See Patient Education module for education specifics.  Outcome: Not Met (add Reason) Goal: Skin Care Protocol Initiated - if Braden Score 18 or less Description: If consults are not indicated, leave blank or document N/A Outcome: Not Met (add Reason) Goal: Nutrition Consult-if indicated Outcome: Not Met (add Reason) Goal: Diabetes Guidelines if Diabetic/Glucose > 140 Description: If diabetic or lab glucose is > 140 mg/dl - Initiate Diabetes/Hyperglycemia Guidelines & Document Interventions  Outcome: Not Met (add Reason)   Problem: SCI BOWEL ELIMINATION Goal: RH STG MANAGE BOWEL WITH ASSISTANCE Description: STG Manage Bowel with Assistance. Outcome: Not Met (add Reason)   Problem: SCI BLADDER ELIMINATION Goal: RH STG MANAGE BLADDER WITH ASSISTANCE Description: STG Manage Bladder With Assistance Outcome: Not Met (add Reason)   Problem: RH SKIN INTEGRITY Goal: RH STG SKIN FREE OF INFECTION/BREAKDOWN Outcome: Not Met (add Reason) Goal: RH STG ABLE TO PERFORM INCISION/WOUND CARE W/ASSISTANCE Description: STG Able To Perform Incision/Wound Care With Assistance. Outcome: Not Met (add Reason)   Problem: RH SAFETY Goal: RH STG ADHERE TO SAFETY PRECAUTIONS W/ASSISTANCE/DEVICE Description: STG Adhere to Safety Precautions With Assistance/Device. Outcome: Not Met (add Reason) Goal: RH STG DECREASED RISK OF FALL WITH ASSISTANCE Description: STG Decreased Risk of Fall With Assistance. Outcome: Not Met (add Reason)   Problem: RH PAIN MANAGEMENT Goal: RH STG PAIN MANAGED AT OR BELOW PT'S PAIN GOAL Outcome: Not Met (add Reason)   Problem: RH KNOWLEDGE DEFICIT SCI Goal: RH STG INCREASE KNOWLEDGE OF SELF CARE AFTER SCI Outcome: Not Met (add Reason)   Problem: RH Vision Goal: RH LTG Vision (Specify) Outcome: Not Met (add Reason)   Problem: RH Pre-functional/Other (Specify) Goal: RH LTG Pre-functional  (Specify) Outcome: Not Met (add Reason) Goal: RH LTG Interdisciplinary (Specify) 1 Description: RH LTG Interdisciplinary (Specify)1 Outcome: Not Met (add Reason) Goal: RH LTG Interdisciplinary (Specify) 2 Description: RH LTG Interdisciplinary (Specify) 2  Outcome: Not Met (add Reason)  Transferring to OR

## 2020-11-29 NOTE — Progress Notes (Signed)
Occupational Therapy Note  Patient Details  Name: Daniel Cline MRN: [id number] Date of Birth: 1955-11-02  Today's Date: 11/29/2020 OT Missed Time: 60 Minutes Missed Time Reason: MD hold (comment)  OT eval scheduled for today. Pt now on medical hold with I & D scheduled. OT will f/u when pt medically stable and appropriate for therapy.    Crissie Reese 11/29/2020, 10:34 AM

## 2020-11-29 NOTE — Progress Notes (Signed)
ANTICOAGULATION CONSULT NOTE - Initial Consult  Pharmacy Consult for Eliquis to IV heparin Indication: DVT  No Known Allergies  Patient Measurements: Height: 5\' 7"  (170.2 cm) Weight: 84.6 kg (186 lb 8.2 oz) IBW/kg (Calculated) : 66.1 Heparin Dosing Weight: 83.2 kg  Vital Signs: Temp: 98.2 F (36.8 C) (04/03 1315) Temp Source: Oral (04/03 1315) BP: 131/76 (04/03 1315) Pulse Rate: 88 (04/03 1315)  Labs: Recent Labs    11/27/20 0500 11/28/20 0432  HGB 11.7* 11.8*  HCT 35.2* 35.2*  PLT 507* 511*  CREATININE 0.74 0.67    Estimated Creatinine Clearance: 95.7 mL/min (by C-G formula based on SCr of 0.67 mg/dL).   Medical History: Past Medical History:  Diagnosis Date  . 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 Transferred to inpatient rehab on 4/2. Lower extremity 6/2 found acute DVT in RLE, started on apixaban. Today patient with increased cervical posterior incision drainage and severe headache, Neurosurgery consulted and planning for OR I&D and washout later today.   Contacted Neurosurgery and spoke with NP [place], plan to transition from Eliquis to IV heparin for DVT, OR plans delayed until possibly tomorrow given recent Eliquis dose.  Last dose Eliquis at 0757 today.  Per NP, wound draining minimal blood and mostly pus. Last CBC yesterday - Hgb 11.8, platelets 511. No additional s/sx bleeding reported.  Goal of Therapy:  Heparin level 0.3-0.7 units/ml aPTT 66-102 seconds Monitor platelets by anticoagulation protocol: Yes   Plan:  Discontinue Eliquis Start IV heparin 1300 units/hr at 2000 today aPTT 6 hours after heparin started Daily heparin level, aPTT, CBC Monitor for s/sx bleeding F/u transition back to Eliquis when able  Verlin Dike, PharmD PGY-1 Pharmacy  Resident 11/29/2020 2:47 PM Please see AMION for all pharmacy numbers

## 2020-11-29 NOTE — Progress Notes (Signed)
NEUROSURGERY PROGRESS NOTE  Patient was discharge to rehab yesterday. Today complains of a lot more neck pain and headaches. Worse when he is sitting up. Having a lot of drainage out of his posterior cervical wound. I do think he needs an I &D of his cervical wound. We will plan to do this later today since he just ate breakfast at 9:00. NPO now.   Temp:  [98.2 F (36.8 C)-99 F (37.2 C)] 98.2 F (36.8 C) (04/03 0440) Pulse Rate:  [88-98] 95 (04/03 0755) Resp:  [17-20] 20 (04/03 0440) BP: (109-144)/(62-87) 135/72 (04/03 0755) SpO2:  [92 %-94 %] 93 % (04/03 0440) Weight:  [84.6 kg] 84.6 kg (04/02 1444)   Sherryl Manges, NP 11/29/2020 10:15 AM

## 2020-11-29 NOTE — Progress Notes (Signed)
Noted profuse drainage from cervical wound. Old dressing was saturated with thin brownish drainage and noted more drainage dripping out from the middle of incision. Dr Berline Chough made aware and assessed incision. Cleansed and new dressing applied to incision. Neurosurgery consulted by Dr Berline Chough. Plan is to DC pt to OR today. Pt currently NPO with sips. Pain med administered for comfort.   Marylu Lund, RN

## 2020-11-29 NOTE — H&P (Signed)
Physical Medicine and Rehabilitation Admission H&P   epidural abscess No chief complaint on file. 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  He was taken emergently to the OR and had a laminectomy and abscess evacuation on admission.  He also was found to have a right forearm abscess that was also drained by I&D by orthopedic surgery.  He was seen by infectious disease due to his abscesses growing out MSSA.  He is on IV antibiotics and is on Ancef to complete a 6 weeks course.  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  Due to that and neurogenic bowel and bladder, and his impaired level of mobility, as well as his severe uncontrolled pain, he was felt to be appropriate to admit for inpatient rehab at [facility].    Review of Systems  Constitutional: Positive for chills and malaise/fatigue. Negative for fever.  HENT: Negative.  Negative for ear discharge, nosebleeds and sore throat.   Eyes: Positive for blurred vision. Negative for double vision and photophobia.  Respiratory: Negative.  Negative for cough, hemoptysis and wheezing.   Cardiovascular: Positive for palpitations. Negative for orthopnea.  Gastrointestinal: Negative for abdominal pain and heartburn.       Patient is having some intermittent nausea His bowel movement was last Friday-he notes that just "came out"; and had no control of it  Genitourinary:       He was having urinary retention and they placed a Foley catheter  Musculoskeletal: Positive for back pain, myalgias and neck pain.       He reports his pain is 10 out of 10 anytime he sits up; and 9 out of 10 at rest-he notes is the worst pain he is ever had  Skin:       Cervical incision is draining per patient   Neurological: Positive for tingling, sensory change, focal weakness, weakness and headaches.  Endo/Heme/Allergies: Negative.  Negative for polydipsia.  Psychiatric/Behavioral: Positive for depression. The patient is nervous/anxious and has insomnia.   All other systems reviewed and are negative.  Past Medical History:  Diagnosis Date  . Diabetes mellitus without complication [facility])    Past Surgical History:  Procedure Laterality Date  . I & D EXTREMITY Right 11/20/2020   Procedure: IRRIGATION AND DEBRIDEMENT FOREARM;  Surgeon: Nicholes Stairs, MD;  Location: [place];  Service: Orthopedics;  Laterality: Right;  . POSTERIOR CERVICAL LAMINECTOMY N/A 11/18/2020   Procedure: CERVICAL THREE-FOUR, CERVICAL FOUR-FIVE, CERVICAL FIVE-SIX, CERVICAL SIX-SEVEN CERVICAL LAMINECTOMY FOR EVACUATION OF ABSCESS;  Surgeon: Earnie Larsson, MD;  Location: [place];  Service: Neurosurgery;  Laterality: N/A;   History reviewed. No pertinent family history. Social History:  reports that he has never smoked. He has never used smokeless tobacco. He reports current alcohol use. He reports that he does not use drugs. Allergies: No Known Allergies Medications Prior to Admission  Medication Sig Dispense Refill  . ceFAZolin (ANCEF) IVPB Inject 2 g into the vein every 8 (eight) hours. Indication:  MSSA C-spine epidural abscess First Dose: No Last Day of Therapy:  01/01/21 Labs - Once weekly:  CBC/D and BMP, Labs - Every other week:  ESR and CRP Method of administration: IV Push Method of administration may be changed at the discretion of  home infusion pharmacist based upon assessment of the patient and/or caregiver's ability to self-administer the medication ordered. 110 Units 0  . diphenhydrAMINE (BENADRYL) 25 MG tablet Take 25 mg by mouth every 6 (six) hours as needed for allergies.    Marland Kitchen HYDROcodone-acetaminophen (NORCO/VICODIN) 5-325 MG tablet Take 1 tablet by mouth every 6 (six) hours as needed. (Patient taking  differently: Take 1 tablet by mouth every 6 (six) hours as needed for moderate pain.) 12 tablet 0  . metFORMIN (GLUCOPHAGE) 1000 MG tablet Take 1,000 mg by mouth 2 (two) times daily.    . metoprolol tartrate (LOPRESSOR) 50 MG tablet Take 50 mg by mouth 2 (two) times daily.    Marland Kitchen oxyCODONE (OXY IR/ROXICODONE) 5 MG immediate release tablet Take 5 mg by mouth 3 (three) times daily as needed for moderate pain.      Drug Regimen Review  Drug regimen was reviewed and remains appropriate with no significant issues identified  Home: Home Living Family/patient expects to be discharged to:: Private residence Living Arrangements: Children Available Help at Discharge: Family,Available 24 hours/day Type of Home: House Home Access: Stairs to enter [employer] of Steps: 2  Lives With: Family,Son   Functional History:    Functional Status:  Mobility:          ADL:    Cognition: Cognition Orientation Level: Oriented to person,Oriented to situation,Oriented to place    Physical Exam: Blood pressure 135/72, pulse 95, temperature 98.2 F (36.8 C), temperature source Oral, resp. rate 20, height _0  (1.702 m), weight 84.6 kg, SpO2 93 %. Physical Exam Constitutional:      Comments: Elderly male laying supine in bed; c/o pain 9-10/10;  Patient is hard of hearing, but appropriate and alert and oriented x3. NAD  HENT:     Head: Normocephalic and atraumatic.     Comments: Smile equal    Right Ear: External ear normal.     Left Ear: External ear normal.     Nose: Nose normal. No congestion.     Mouth/Throat:     Mouth: Mucous membranes are dry.     Pharynx: Oropharynx is clear. No oropharyngeal exudate.  Eyes:     General:        Right eye: No discharge.        Left eye: No discharge.     Extraocular Movements: Extraocular movements intact.  Neck:     Comments: He has a posterior neck incision-it is draining copious amounts, and has saturated the current dressing-it was  unclear when the dressing was last changed.  The the drainage is mainly serous, but blood-tinged Cardiovascular:     Rate and Rhythm: Normal rate and regular rhythm.     Heart sounds: Normal heart sounds. No murmur heard. No gallop.   Pulmonary:     Comments: CTA B/L- no W/R/R- good air movement Abdominal:     Comments: Soft, NT, ND, (+)BS - hypoactive  Genitourinary:    Comments: He has a Foley that is draining light amber urine Musculoskeletal:     Comments: LUE-strength in his Left arm is 4-/5-in the deltoid, bicep, tricep, wrist extension, grip, and finger abduction RUE-deltoid bicep and tricep are 3/5, wrist extension and grip are 3-/5, and finger abduction is 1/5.  LLE-4/5 in hip flexion, knee extension, and dorsiflexion as well as plantarflexion RLE-strength is 4-/5-in the same muscles    Skin:    General: Skin is warm and dry.     Comments: His right forearm has  2 sets of sutures that look great; the right forearm has decreased swelling and trace bruising; His cervical incision-has a honeycomb dressing in place that is soaked-with serosanguineous drainage His bottom looks fantastic with no breakdown He has excoriation and M ASD in the groin left greater than right His heels have no breakdown or puffiness PICC line in his left upper extremity looks good  Neurological:     Mental Status: He is alert.     Comments: He is alert and oriented x3 He reports his light touch is intact in all 4 extremities He has no Hoffmann's bilaterally He has no lower extremity clonus bilaterally    Psychiatric:     Comments: Frustrated and upset about uncontrolled pain     Results for orders placed or performed during the hospital encounter of 11/28/20 (from the past 48 hour(s))  Glucose, capillary     Status: Abnormal   Collection Time: 11/28/20  4:24 PM  Result Value Ref Range   Glucose-Capillary 145 (H) 70 - 99 mg/dL    Comment: Glucose reference range applies only to samples taken  after fasting for at least 8 hours.  Glucose, capillary     Status: Abnormal   Collection Time: 11/28/20  9:17 PM  Result Value Ref Range   Glucose-Capillary 194 (H) 70 - 99 mg/dL    Comment: Glucose reference range applies only to samples taken after fasting for at least 8 hours.  Glucose, capillary     Status: Abnormal   Collection Time: 11/29/20  6:02 AM  Result Value Ref Range   Glucose-Capillary 139 (H) 70 - 99 mg/dL    Comment: Glucose reference range applies only to samples taken after fasting for at least 8 hours.   VAS [place] LOWER EXTREMITY VENOUS (DVT)  Result Date: 11/28/2020  Lower Venous DVT Study Indications: Post-op, history of laminectomy for epidural abscess.  Comparison Study: No prior studies. Performing Technologist: Darlin Coco RDMS,RVT  Examination Guidelines: A complete evaluation includes B-mode imaging, spectral Doppler, color Doppler, and power Doppler as needed of all accessible portions of each vessel. Bilateral testing is considered an integral part of a complete examination. Limited examinations for reoccurring indications may be performed as noted. The reflux portion of the exam is performed with the patient in reverse Trendelenburg.  +---------+---------------+---------+-----------+----------+--------------+ RIGHT    CompressibilityPhasicitySpontaneityPropertiesThrombus Aging +---------+---------------+---------+-----------+----------+--------------+ CFV      Full           Yes      Yes                                 +---------+---------------+---------+-----------+----------+--------------+ SFJ      Full                                                        +---------+---------------+---------+-----------+----------+--------------+ FV Prox  Full                                                        +---------+---------------+---------+-----------+----------+--------------+ FV Mid   Full                                                         +---------+---------------+---------+-----------+----------+--------------+  FV DistalFull                                                        +---------+---------------+---------+-----------+----------+--------------+ PFV      Full                                                        +---------+---------------+---------+-----------+----------+--------------+ POP      Full           Yes      Yes                                 +---------+---------------+---------+-----------+----------+--------------+ PTV      Full                                                        +---------+---------------+---------+-----------+----------+--------------+ PERO     None           No       No                   Acute          +---------+---------------+---------+-----------+----------+--------------+ Soleal   None           No       No                   Acute          +---------+---------------+---------+-----------+----------+--------------+   +---------+---------------+---------+-----------+----------+--------------+ LEFT     CompressibilityPhasicitySpontaneityPropertiesThrombus Aging +---------+---------------+---------+-----------+----------+--------------+ CFV      Full           Yes      Yes                                 +---------+---------------+---------+-----------+----------+--------------+ SFJ      Full                                                        +---------+---------------+---------+-----------+----------+--------------+ FV Prox  Full                                                        +---------+---------------+---------+-----------+----------+--------------+ FV Mid   Full                                                        +---------+---------------+---------+-----------+----------+--------------+ FV DistalFull                                                         +---------+---------------+---------+-----------+----------+--------------+  PFV      Full                                                        +---------+---------------+---------+-----------+----------+--------------+ POP      Full           Yes      Yes                                 +---------+---------------+---------+-----------+----------+--------------+ PTV      Full                                                        +---------+---------------+---------+-----------+----------+--------------+ PERO     Full                                                        +---------+---------------+---------+-----------+----------+--------------+     Summary: RIGHT: - Findings consistent with acute deep vein thrombosis involving the right peroneal veins, and right soleal veins. - No cystic structure found in the popliteal fossa.  LEFT: - There is no evidence of deep vein thrombosis in the lower extremity.  - A cystic structure is found in the popliteal fossa.  *See table(s) above for measurements and observations. Electronically signed by Monica Martinez MD on 11/28/2020 at 5:13:41 PM.    Final        Medical Problem List and Plan: 1.  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  Antithrombotics: -DVT/anticoagulation:  Pharmaceutical: Xarelto actually Eliquis  -antiplatelet therapy: N/A 3. Pain Management: Patient is already receiving oxycodone as needed; we started tramadol scheduled but that is not working; will DC tramadol and schedule MS Contin 15 mg twice daily 4. Mood: - N/A- will see if pt needs to work with Neuropsychology due to uncontrolled pain and new SCI.   -antipsychotic agents: N/A 5. Neuropsych: This patient is capable of making decisions on his own behalf. 6. Skin/Wound Care: We will change his honeycomb  dressing on his neck to daily and as needed; will also change the dressing on his right forearm to just a dry dressing and change as needed.  7. Fluids/Electrolytes/Nutrition: We will check labs on Monday 8. Uncontrolled Diabetes II- A1c of 13.9-continue patient's Levemir 10 units nightly and sliding scale insulin And monitor 9. Hypertension-patient is on HCTZ 25 mg daily as well as Norvasc 10 mg daily and lisinopril 40 mg daily and Lopressor 50 mg twice daily-blood pressure is well controlled we will continue these medications and monitor especially with increased activity 10.  Neurogenic bowel-we will look to start him on a bowel program nightly after dinner with Dulcolax suppositories and digital stimulation 12.  Acute DVT in the soleus and peroneal-we will change his Lovenox to apixaban/Eliquis 10 mg BID x 1 week,  then 5 mg BID.  13.  Neurogenic bladder-we will continue his Foley for now and start Flomax tomorrow 0.4 mg q. supper and see if he is able to start to void;  we might need to titrate this as required- will try and remove foley next week.        Courtney Heys, MD 11/29/2020

## 2020-11-30 LAB — COMPREHENSIVE METABOLIC PANEL
ALT: 15 U/L (ref 0–44)
AST: 21 U/L (ref 15–41)
Albumin: 1.7 g/dL — ABNORMAL LOW (ref 3.5–5.0)
Alkaline Phosphatase: 193 U/L — ABNORMAL HIGH (ref 38–126)
Anion gap: 9 (ref 5–15)
BUN: 13 mg/dL (ref 8–23)
CO2: 25 mmol/L (ref 22–32)
Calcium: 8.4 mg/dL — ABNORMAL LOW (ref 8.9–10.3)
Chloride: 98 mmol/L (ref 98–111)
Creatinine, Ser: 0.67 mg/dL (ref 0.61–1.24)
GFR, Estimated: >60 mL/min (ref >60)
Glucose, Bld: 171 mg/dL — ABNORMAL HIGH (ref 70–99)
Potassium: 3.1 mmol/L — ABNORMAL LOW (ref 3.5–5.1)
Sodium: 132 mmol/L — ABNORMAL LOW (ref 135–145)
Total Bilirubin: 0.7 mg/dL (ref 0.3–1.2)
Total Protein: 6.4 g/dL — ABNORMAL LOW (ref 6.5–8.1)

## 2020-11-30 LAB — HEPARIN LEVEL (UNFRACTIONATED): Heparin Unfractionated: 1 IU/mL — ABNORMAL HIGH (ref 0.30–0.70)

## 2020-11-30 LAB — CBC WITH DIFFERENTIAL/PLATELET
Abs Immature Granulocytes: 0.09 10*3/uL — ABNORMAL HIGH (ref 0.00–0.07)
Basophils Absolute: 0.1 10*3/uL (ref 0.0–0.1)
Basophils Relative: 0 %
Eosinophils Absolute: 0.1 10*3/uL (ref 0.0–0.5)
Eosinophils Relative: 1 %
HCT: 36.4 % — ABNORMAL LOW (ref 39.0–52.0)
Hemoglobin: 12.4 g/dL — ABNORMAL LOW (ref 13.0–17.0)
Immature Granulocytes: 1 %
Lymphocytes Relative: 18 %
Lymphs Abs: 2.6 10*3/uL (ref 0.7–4.0)
MCH: 31.6 pg (ref 26.0–34.0)
MCHC: 34.1 g/dL (ref 30.0–36.0)
MCV: 92.9 fL (ref 80.0–100.0)
Monocytes Absolute: 1.1 10*3/uL — ABNORMAL HIGH (ref 0.1–1.0)
Monocytes Relative: 8 %
Neutro Abs: 10.5 10*3/uL — ABNORMAL HIGH (ref 1.7–7.7)
Neutrophils Relative %: 72 %
Platelets: 528 10*3/uL — ABNORMAL HIGH (ref 150–400)
RBC: 3.92 MIL/uL — ABNORMAL LOW (ref 4.22–5.81)
RDW: 13.2 % (ref 11.5–15.5)
WBC: 14.5 10*3/uL — ABNORMAL HIGH (ref 4.0–10.5)
nRBC: 0 % (ref 0.0–0.2)

## 2020-11-30 LAB — APTT: aPTT: 49 seconds — ABNORMAL HIGH (ref 24–36)

## 2020-11-30 LAB — GLUCOSE, CAPILLARY
Glucose-Capillary: 168 mg/dL — ABNORMAL HIGH (ref 70–99)
Glucose-Capillary: 162 mg/dL — ABNORMAL HIGH (ref 70–99)
Glucose-Capillary: 155 mg/dL — ABNORMAL HIGH (ref 70–99)

## 2020-11-30 MED ORDER — LISINOPRIL 20 MG PO TABS: 40.0000 mg | ORAL_TABLET | Freq: Every day | ORAL | Status: DC

## 2020-11-30 MED ORDER — BISACODYL 10 MG RE SUPP: 10.0000 mg | Freq: Every day | RECTAL | 0 refills | Status: AC

## 2020-11-30 MED ORDER — SENNOSIDES-DOCUSATE SODIUM 8.6-50 MG PO TABS: 1.0000 | ORAL_TABLET | Freq: Two times a day (BID) | ORAL | Status: AC

## 2020-11-30 MED ORDER — CEFAZOLIN SODIUM-DEXTROSE 2-4 GM/100ML-% IV SOLN: 2.0000 g | Freq: Three times a day (TID) | INTRAVENOUS | Status: DC

## 2020-11-30 MED ORDER — TAMSULOSIN HCL 0.4 MG PO CAPS: 0.4000 mg | ORAL_CAPSULE | Freq: Every day | ORAL | Status: DC

## 2020-11-30 MED ORDER — INSULIN ASPART 100 UNIT/ML ~~LOC~~ SOLN: 0.0000 [IU] | Freq: Three times a day (TID) | SUBCUTANEOUS | 11 refills | Status: DC

## 2020-11-30 MED ORDER — BISACODYL 10 MG RE SUPP: 10.0000 mg | Freq: Every day | RECTAL | 0 refills | Status: AC | PRN

## 2020-11-30 MED ORDER — ZINC OXIDE 40 % EX OINT: TOPICAL_OINTMENT | Freq: Two times a day (BID) | CUTANEOUS | 0 refills | Status: AC

## 2020-11-30 MED ORDER — MORPHINE SULFATE ER 15 MG PO TBCR: 15.0000 mg | EXTENDED_RELEASE_TABLET | Freq: Two times a day (BID) | ORAL | 0 refills | Status: DC

## 2020-11-30 MED ORDER — INSULIN DETEMIR 100 UNIT/ML ~~LOC~~ SOLN: 10.0000 [IU] | Freq: Every day | SUBCUTANEOUS | 11 refills | Status: DC

## 2020-11-30 MED ORDER — POLYETHYLENE GLYCOL 3350 17 G PO PACK: 17.0000 g | PACK | Freq: Every day | ORAL | 0 refills | Status: DC

## 2020-11-30 MED ORDER — AMLODIPINE BESYLATE 10 MG PO TABS: 10.0000 mg | ORAL_TABLET | Freq: Every day | ORAL | Status: DC

## 2020-11-30 MED ORDER — ONDANSETRON HCL 4 MG PO TABS: 4.0000 mg | ORAL_TABLET | Freq: Four times a day (QID) | ORAL | 0 refills | Status: AC | PRN

## 2020-11-30 MED ORDER — POTASSIUM CHLORIDE CRYS ER 20 MEQ PO TBCR
40.0000 meq | EXTENDED_RELEASE_TABLET | Freq: Two times a day (BID) | ORAL | Status: AC
  Administered 2020-11-30 (×2): 40 meq via ORAL
  Filled 2020-11-30 (×2): qty 2

## 2020-11-30 MED ORDER — ACETAMINOPHEN 325 MG PO TABS: 650.0000 mg | ORAL_TABLET | ORAL | Status: AC

## 2020-11-30 MED ORDER — HYDROCHLOROTHIAZIDE 25 MG PO TABS: 25.0000 mg | ORAL_TABLET | Freq: Every day | ORAL | Status: DC

## 2020-11-30 MED ORDER — HEPARIN (PORCINE) 25000 UT/250ML-% IV SOLN: 1300.0000 [IU]/h | INTRAVENOUS | Status: DC

## 2020-11-30 MED ORDER — FERROUS SULFATE 325 (65 FE) MG PO TABS: 325.0000 mg | ORAL_TABLET | Freq: Every day | ORAL | 3 refills | Status: DC

## 2020-11-30 MED ORDER — APIXABAN 5 MG PO TABS
5.0000 mg | ORAL_TABLET | Freq: Two times a day (BID) | ORAL | Status: DC
  Administered 2020-12-06 – 2021-01-01 (×53): 5 mg via ORAL
  Filled 2020-11-30 (×53): qty 1

## 2020-11-30 MED ORDER — APIXABAN 5 MG PO TABS
10.0000 mg | ORAL_TABLET | Freq: Two times a day (BID) | ORAL | Status: AC
  Administered 2020-11-30 – 2020-12-05 (×12): 10 mg via ORAL
  Filled 2020-11-30 (×12): qty 2

## 2020-11-30 MED ORDER — METOPROLOL TARTRATE 50 MG PO TABS: 50.0000 mg | ORAL_TABLET | Freq: Two times a day (BID) | ORAL | Status: DC

## 2020-11-30 NOTE — Progress Notes (Signed)
MD Pool came to see patient and assess surgical site. MD stated no need for surgery and may resume diet and eliquis, D/C heparin drip. Pt alert and oriented, in no distress, no drainage noted to surgical site. Pt noted to have trouble with holding utensils during meal time, pt states his "arthritis" does not allow him to close his hand in order to feed himself. Will put grippers on utensils for next meal.

## 2020-11-30 NOTE — Progress Notes (Addendum)
ANTICOAGULATION CONSULT NOTE - Initial Consult Pharmacy Consult for Eliquis   Indication: DVT  No Known Allergies  Patient Measurements: Height: 5\' 7"  (170.2 cm) Weight: 84.6 kg (186 lb 8.2 oz) IBW/kg (Calculated) : 66.1 Heparin Dosing Weight: 83.2 kg  Vital Signs: Temp: 99.5 F (37.5 C) (04/04 0435) Temp Source: Oral (04/04 0435) BP: 132/87 (04/04 0831) Pulse Rate: 93 (04/04 0834)  Labs: Recent Labs    11/28/20 0432 11/30/20 0733  HGB 11.8* 12.4*  HCT 35.2* 36.4*  PLT 511* 528*  APTT  --  49*  HEPARINUNFRC  --  1.00*  CREATININE 0.67 0.67    Estimated Creatinine Clearance: 95.7 mL/min (by C-G formula based on SCr of 0.67 mg/dL).   Medical History: Past Medical History:  Diagnosis Date  . 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 Transferred to inpatient rehab on 4/2. Lower extremity 6/2 found acute DVT in RLE, started on apixaban. Today patient with increased cervical posterior incision drainage and severe headache, Neurosurgery consulted and planning for OR I&D and washout later today.   Contacted Neurosurgery and spoke with NP [place], plan to transition from Eliquis to IV heparin for DVT, OR plans delayed until possibly tomorrow given recent Eliquis dose.  Last dose Eliquis at 0757 today.  Per NP, wound draining minimal blood and mostly pus. Last CBC yesterday - Hgb 11.8, platelets 511. No additional s/sx bleeding reported.  PTT came back subtherapeutic this AM at 49. Not correlating the heparin level yet due to Eliquis. Plan for washout today per NS. Will adjust rate for now.   Addendum  No further procedure planned. Resume apixaban per NS.  Goal of Therapy:  Heparin level 0.3-0.7 units/ml aPTT 66-102 seconds Monitor platelets by anticoagulation protocol:  Yes   Plan:  Dc heparin Apixaban 10mg  PO BID 5d then 5mg  BID Rx will monitor peripherally  Verlin Dike, PharmD, BCIDP, AAHIVP, CPP Infectious Disease Pharmacist 11/30/2020 9:32 AM

## 2020-11-30 NOTE — Progress Notes (Signed)
Events of this weekend noted.  Currently the patient looks good.  He is awake and alert.  He is nontoxic-appearing.  He denies significant pain.  He is running low-grade fevers.  His vital signs are stable.  He is awake and alert.  He is oriented and reasonably appropriate.  His motor examination continues to improve.  He is got better deltoid function bilaterally.  Has good grips in both hands.  He is moving both lower extremities well.  His wound actually looks very good.  There is no current drainage nor is there any drainage on his dressing.  The wound itself is flat.  There is no surrounding erythema.  I reviewed the patient's CT scan of the cervical spine.  This demonstrates postoperative change with some ongoing changes consistent with myositis from his paraspinal infection.  He has a moderately small dorsal epidural Collection which is noncompressive.  I think the patient probably had a postoperative seroma mixed with some ongoing infection which spontaneously drained over the weekend.  Currently his wound looks quite good and there is no evidence of any ongoing drainage.  At this point think he is best served by continuing IV antibiotics.  I do not see any benefit from repeat I&D of his wound.  We will continue to monitor for signs of drainage or other problems.  I definitely do not believe that there is anything consistent with CSF leak and once again there was no evidence of CSF leak during the surgery.  The patient may be restarted on his Eliquis.  He may be mobilized ad lib.  He may resume his diet.

## 2020-11-30 NOTE — Progress Notes (Signed)
Patient ID: Daniel Cline, male   DOB: 10-06-1955, 65 y.o.   MRN: [id number]  Per attending pt to d/c to OR today and not return.   *SW received updates from medical team indicating pt not having procedure, and will remain on rehab.   SW sent [employer] [place] application to [employer].  Cecile Sheerer, MSW, LCSWA Office: [phone] Cell: [phone] Fax: [phone]

## 2020-11-30 NOTE — Progress Notes (Signed)
Bowel program completed

## 2020-11-30 NOTE — Progress Notes (Signed)
PROGRESS NOTE   Subjective/Complaints: Appreciate Dr. Lindalou HosePool's eval, patient will not go to OR. May be restarted on Eliquis and diet and resume therapies. Drainage minimal today. Continue IV abx.  ROS: denies significant pain  Objective:   CT CERVICAL SPINE WO CONTRAST  Result Date: 11/29/2020 CLINICAL DATA:  Epidural abscess. EXAM: CT CERVICAL SPINE WITHOUT CONTRAST TECHNIQUE: Multidetector CT imaging of the cervical spine was performed without intravenous contrast. Multiplanar CT image reconstructions were also generated. COMPARISON:  MRI cervical spine 11/18/2020. FINDINGS: Alignment: No significant listhesis is present. Straightening of the normal cervical lordosis again noted. Skull base and vertebrae: Craniocervical junction normal. Wide laminectomy noted C3-C7. Facet alignment remains. Soft tissues and spinal canal: Persistent heterogeneous fluid collections are present the left paraspinous musculature. Central fluid collection now present dorsal to the thecal sac along the surgical tract. Disc levels: Leftward disc osteophyte complex is again seen at L3-4 and L4-5. Foraminal narrowing stable. Upper chest: Bilateral pleural effusions are present. No pneumothorax present. Left-sided PICC line noted. IMPRESSION: 1. Persistent heterogeneous fluid collections in the left paraspinous musculature concerning for infection. 2. Decompression of the spinal canal with laminectomies C3 through C7. 3. Central fluid collection now present dorsal to the thecal sac along the surgical tract. 4. 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 Bilateral foraminal narrowing is stable. 5. Bilateral pleural effusions. Electronically Signed   By: Marin Robertshristopher  Mattern M.D.   On: 11/29/2020 20:47   VAS US LOWER EXTREMITY VENOUS (DVT)  Result Date: 11/28/2020  Lower Venous DVT Study Indications: Post-op, history of laminectomy for epidural abscess.   Comparison Study: No prior studies. Performing Technologist: Jean Rosenthalachel Hodge RDMS,RVT  Examination Guidelines: A complete evaluation includes B-mode imaging, spectral Doppler, color Doppler, and power Doppler as needed of all accessible portions of each vessel. Bilateral testing is considered an integral part of a complete examination. Limited examinations for reoccurring indications may be performed as noted. The reflux portion of the exam is performed with the patient in reverse Trendelenburg.  +---------+---------------+---------+-----------+----------+--------------+ RIGHT    CompressibilityPhasicitySpontaneityPropertiesThrombus Aging +---------+---------------+---------+-----------+----------+--------------+ CFV      Full           Yes      Yes                                 +---------+---------------+---------+-----------+----------+--------------+ SFJ      Full                                                        +---------+---------------+---------+-----------+----------+--------------+ FV Prox  Full                                                        +---------+---------------+---------+-----------+----------+--------------+ FV Mid   Full                                                        +---------+---------------+---------+-----------+----------+--------------+  FV DistalFull                                                        +---------+---------------+---------+-----------+----------+--------------+ PFV      Full                                                        +---------+---------------+---------+-----------+----------+--------------+ POP      Full           Yes      Yes                                 +---------+---------------+---------+-----------+----------+--------------+ PTV      Full                                                        +---------+---------------+---------+-----------+----------+--------------+ PERO      None           No       No                   Acute          +---------+---------------+---------+-----------+----------+--------------+ Soleal   None           No       No                   Acute          +---------+---------------+---------+-----------+----------+--------------+   +---------+---------------+---------+-----------+----------+--------------+ LEFT     CompressibilityPhasicitySpontaneityPropertiesThrombus Aging +---------+---------------+---------+-----------+----------+--------------+ CFV      Full           Yes      Yes                                 +---------+---------------+---------+-----------+----------+--------------+ SFJ      Full                                                        +---------+---------------+---------+-----------+----------+--------------+ FV Prox  Full                                                        +---------+---------------+---------+-----------+----------+--------------+ FV Mid   Full                                                        +---------+---------------+---------+-----------+----------+--------------+ FV DistalFull                                                        +---------+---------------+---------+-----------+----------+--------------+  PFV      Full                                                        +---------+---------------+---------+-----------+----------+--------------+ POP      Full           Yes      Yes                                 +---------+---------------+---------+-----------+----------+--------------+ PTV      Full                                                        +---------+---------------+---------+-----------+----------+--------------+ PERO     Full                                                        +---------+---------------+---------+-----------+----------+--------------+     Summary: RIGHT: - Findings consistent with acute  deep vein thrombosis involving the right peroneal veins, and right soleal veins. - No cystic structure found in the popliteal fossa.  LEFT: - There is no evidence of deep vein thrombosis in the lower extremity.  - A cystic structure is found in the popliteal fossa.  *See table(s) above for measurements and observations. Electronically signed by Sherald Hess MD on 11/28/2020 at 5:13:41 PM.    Final    Recent Labs    11/28/20 0432 11/30/20 0733  WBC 20.8* 14.5*  HGB 11.8* 12.4*  HCT 35.2* 36.4*  PLT 511* 528*   Recent Labs    11/28/20 0432 11/30/20 0733  NA 133* 132*  K 3.2* 3.1*  CL 98 98  CO2 26 25  GLUCOSE 113* 171*  BUN 11 13  CREATININE 0.67 0.67  CALCIUM 8.1* 8.4*    Intake/Output Summary (Last 24 hours) at 11/30/2020 1238 Last data filed at 11/30/2020 1202 Gross per 24 hour  Intake 690.8 ml  Output 1020 ml  Net -329.2 ml        Physical Exam: Vital Signs Blood pressure 132/87, pulse 93, temperature 99.5 F (37.5 C), temperature source Oral, resp. rate 20, height  (1.702 m), weight 84.6 kg, SpO2 96 %. Gen: no distress, normal appearing HEENT: oral mucosa pink and moist, NCAT Cardio: Reg rate Chest: normal effort, normal rate of breathing Abd: soft, non-distended Ext: no edema Psych: pleasant, normal affect Musculoskeletal:     Comments: LUE-strength in his Left arm is 4-/5-in the deltoid, bicep, tricep, wrist extension, grip, and finger abduction RUE-deltoid bicep and tricep are 3/5, wrist extension and grip are 3-/5, and finger abduction is 1/5.  LLE-4/5 in hip flexion, knee extension, and dorsiflexion as well as plantarflexion RLE-strength is 4-/5-in the same muscles    Skin:    General: Skin is warm and dry.     Comments: His right forearm has 2 sets of sutures that look great; the right forearm has decreased swelling and trace bruising; His cervical  incision has minimal drainage today His bottom looks fantastic with no breakdown He has excoriation  and M ASD in the groin left greater than right His heels have no breakdown or puffiness PICC line in his left upper extremity looks good  Neurological:     Mental Status: He is alert.     Comments: He is alert and oriented x3 He reports his light touch is intact in all 4 extremities He has no Hoffmann's bilaterally He has no lower extremity clonus bilaterally  Assessment/Plan: 1. Functional deficits which require 3+ hours per day of interdisciplinary therapy in a comprehensive inpatient rehab setting.  Physiatrist is providing close team supervision and 24 hour management of active medical problems listed below.  Physiatrist and rehab team continue to assess barriers to discharge/monitor patient progress toward functional and medical goals  Care Tool:  Bathing              Bathing assist       Upper Body Dressing/Undressing Upper body dressing        Upper body assist      Lower Body Dressing/Undressing Lower body dressing            Lower body assist       Toileting Toileting    Toileting assist       Transfers Chair/bed transfer  Transfers assist           Locomotion Ambulation   Ambulation assist              Walk 10 feet activity   Assist           Walk 50 feet activity   Assist           Walk 150 feet activity   Assist           Walk 10 feet on uneven surface  activity   Assist           Wheelchair     Assist               Wheelchair 50 feet with 2 turns activity    Assist            Wheelchair 150 feet activity     Assist          Blood pressure 132/87, pulse 93, temperature 99.5 F (37.5 C), temperature source Oral, resp. rate 20, height 5\' 7"  (1.702 m), weight 84.6 kg, SpO2 96 %.  Medical Problem List and Plan: 1.  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  Antithrombotics: -DVT/anticoagulation:  Pharmaceutical: Eliquis restarted             -antiplatelet therapy: N/A 3. Pain Management: Patient is already receiving oxycodone as needed; we started tramadol scheduled but that is not working; will DC tramadol and schedule MS Contin 15 mg twice daily 4. Mood: - N/A- will see if pt needs to work with Neuropsychology due to uncontrolled pain and new SCI.              -antipsychotic agents: N/A 5. Neuropsych: This patient is capable of making decisions on his own behalf. 6. Skin/Wound Care: We will change his honeycomb dressing on  his neck to daily and as needed; will also change the dressing on his right forearm to just a dry dressing and change as needed. Less drainage today, will not require I&D 7. Fluids/Electrolytes/Nutrition: We will check labs on Monday 8. Uncontrolled Diabetes II- A1c of 13.9-continue patient's Levemir 10 units nightly and sliding scale insulin And monitor 9. Hypertension-patient is on HCTZ 25 mg daily as well as Norvasc 10 mg daily and lisinopril 40 mg daily and Lopressor 50 mg twice daily-blood pressure is well controlled we will continue these medications and monitor especially with increased activity 10.  Neurogenic bowel-we will look to start him on a bowel program nightly after dinner with Dulcolax suppositories and digital stimulation 12.  Acute DVT in the soleus and peroneal-we will change his Lovenox to apixaban/Eliquis 10 mg BID x 1 week, then 5 mg BID.  13.  Neurogenic bladder-we will continue his Foley for now and start Flomax tomorrow 0.4 mg q. supper and see if he is able to start to void;  we might need to titrate this as required- will try and remove foley next week.  14. Hypokalemia: supplement 12me BID today and repeat BMP tomorrow     LOS: 2 days A FACE TO FACE EVALUATION WAS PERFORMED  Drema Pry Shatonya Passon 11/30/2020, 12:38  PM

## 2020-11-30 NOTE — Progress Notes (Signed)
Inpatient Rehabilitation  Patient information reviewed and entered into eRehab system by Marylyn Appenzeller M. Temprence Rhines, M.A., CCC/SLP, PPS Coordinator.  Information including medical coding, functional ability and quality indicators will be reviewed and updated through discharge.    

## 2020-11-30 NOTE — Progress Notes (Signed)
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 Normal pulses.     Heart sounds: Normal heart sounds. No murmur heard.    Pulmonary:     Comments: CTA B/L- no W/R/R- good air movement  Abdominal:     Comments: Firm, very quiet/hypoactive, distended, but NT  Genitourinary:    Comments: Has dark amber urine in purewick- very dark-  Musculoskeletal:     Comments: RUE- 3-/5 proximally; distally grip 2/5 and finger abd 2-/5 LUE_ 4-/5 in same muscles tested RLE- 4-/5 in HF, KE, DF and PF LLE- 4/5 in same muscles SCDs on LEs-  Skin:    Comments: R hand extremely swollen- appears claw like in shape of fingers/they cannot close or open completely- R forearm ACE wrapped  Neurological:     Comments: HOH Intact to light touch in all 4 extremities No hoffman's B/L  Psychiatric:     Comments: Depressed, in pain        Lab Results Last 24 Hours       Results for orders placed or performed during the hospital encounter of 11/18/20 (from the past 24 hour(s))  Glucose, capillary     Status: Abnormal    Collection Time:  11/25/20  9:08 PM  Result Value Ref Range    Glucose-Capillary 170 (H) 70 - 99 mg/dL    Comment 1 Notify RN      Comment 2 Document in Chart    Basic metabolic panel     Status: Abnormal    Collection Time: 11/26/20  4:18 AM  Result Value Ref Range    Sodium 137 135 - 145 mmol/L    Potassium 3.9 3.5 - 5.1 mmol/L    Chloride 105 98 - 111 mmol/L    CO2 26 22 - 32 mmol/L    Glucose, Bld 157 (H) 70 - 99 mg/dL    BUN 14 8 - 23 mg/dL    Creatinine, Ser 6.19 0.61 - 1.24 mg/dL    Calcium 7.9 (L) 8.9 - 10.3 mg/dL    GFR, Estimated >50 >93 mL/min    Anion gap 6 5 - 15  CBC with Differential/Platelet     Status: Abnormal    Collection Time: 11/26/20  4:18 AM  Result Value Ref Range    WBC 18.8 (H) 4.0 - 10.5 K/uL    RBC 3.74 (L) 4.22 - 5.81 MIL/uL    Hemoglobin 11.9 (L) 13.0 - 17.0 g/dL    HCT 26.7 (L) 12.4 - 52.0 %    MCV 94.7 80.0 - 100.0 fL    MCH 31.8 26.0 - 34.0 pg    MCHC 33.6 30.0 - 36.0 g/dL    RDW 58.0 99.8 - 33.8 %    Platelets 511 (H) 150 - 400 K/uL    nRBC 0.0 0.0 - 0.2 %    Neutrophils Relative % 73 %    Neutro Abs 13.8 (H) 1.7 - 7.7 K/uL    Lymphocytes Relative 16 %    Lymphs Abs 2.9 0.7 - 4.0 K/uL    Monocytes Relative 9 %    Monocytes Absolute 1.6 (H) 0.1 - 1.0 K/uL    Eosinophils Relative 1 %    Eosinophils Absolute 0.1 0.0 - 0.5 K/uL    Basophils Relative 0 %    Basophils Absolute  0.1 0.0 - 0.1 K/uL    Immature Granulocytes 1 %    Abs Immature Granulocytes 0.23 (H) 0.00 - 0.07 K/uL  Magnesium     Status: None    Collection Time: 11/26/20  4:18 AM  Result Value Ref Range    Magnesium 1.9 1.7 - 2.4 mg/dL  Glucose, capillary     Status: Abnormal    Collection Time: 11/26/20  6:02 AM  Result Value Ref Range    Glucose-Capillary 174 (H) 70 - 99 mg/dL    Comment 1 Notify RN      Comment 2 Document in Chart    Glucose, capillary     Status: Abnormal    Collection Time: 11/26/20 11:34 AM  Result Value Ref Range    Glucose-Capillary 137 (H) 70 - 99 mg/dL   Glucose, capillary     Status: Abnormal    Collection Time: 11/26/20  3:55 PM  Result Value Ref Range    Glucose-Capillary 134 (H) 70 - 99 mg/dL       Imaging Results (Last 48 hours)  DG Abd Portable 1V   Result Date: 11/26/2020 CLINICAL DATA:  Abdominal pain, distention and constipation. EXAM: PORTABLE ABDOMEN - 1 VIEW COMPARISON:  None. FINDINGS: The bowel gas pattern is normal. Moderate amount of formed stool matter in the left colon. No radio-opaque calculi or other significant radiographic abnormality are seen. IMPRESSION: 1. Nonobstructive bowel gas pattern. 2. Moderate amount of formed stool matter in the left colon. Electronically Signed   By: Ted Mcalpine M.D.   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 Does the need for close, 24 hr/day medical supervision in concert with the patient's rehab needs make it unreasonable for this patient to be served in a less intensive setting? Yes 12. Co-Morbidities requiring supervision/potential complications: PAIN, neurogenic bowel and bladder, MSSA bacteremia, constipation, on IV ABX til May DM uncontrolled, HTN 13. Due to bladder management, bowel management, safety, skin/wound care, disease management, medication administration, pain management and patient education, does the patient require 24 hr/day rehab nursing? Yes 14. Does the patient require coordinated care of a physician, rehab nurse, therapy disciplines of PT and OT to address physical and functional deficits in the context of the above medical diagnosis(es)? Yes Addressing deficits in the following areas: balance, endurance, locomotion, strength, transferring, bowel/bladder control, bathing, dressing, feeding, grooming and toileting 15. Can the patient actively participate in an intensive therapy program of at least 3 hrs of therapy per day at least  5 days per week? Yes 16. The potential for patient to make measurable gains while on inpatient rehab is fair 17. Anticipated functional outcomes upon discharge from inpatient rehab are supervision and min assist  with PT, supervision and min assist with OT, n/a with SLP. 18. Estimated rehab length of stay to reach the above functional goals is: 3 weeks or so 19. Anticipated discharge destination: Home 20. Overall Rehab/Functional Prognosis: fair   RECOMMENDATIONS: This patient's condition is appropriate for continued rehabilitative care in the following setting: CIR Patient has agreed to participate in recommended program. Potentially Note that insurance prior authorization may be required for reimbursement for recommended care.   Comment:  1. Pt is having "the worst pain of his life"- he isn't asking for oxycodone regularly- his pain does NOT sound neuropathic at this time- I suggest and will order scheduling tramadol 100 mg TID  and then reminding pt to ask for oxycodone q4 hours- if this doesn't work, will need a long acting pain medicine, like MS Contin for short term- he is very poor about asking for pain meds, and then gets behind.  2. He is constipated- LBM 1 week ago per documentation as well as the chart- give him sorbitol- it's not clear if he can have BM on his own or needs a bowel program 3. I STRONGLY suggest you do bladder scans on pt- I think he has a high risk of urinary retention and then only seeing the overflow voiding, which is NOT voluntary voiding. Cath him if he needs it.  4. Pt is NOT on Lovenox- we need to find out from [facility] when this is possible- because he's at very high risk of DVT.  5. Please make it possible for pt to reach his drinks and nursing to feed him if necessary- nothing was in his reach today, and that concerned me, esp with his very dark urine.   6. I apologize it this consult finished so late- there were circumstances not under my control.  7. If pt feeling  better in AM, we would appreciate bringing him to CIR_ however he is NOT willing to work with therapy at this current time, and it's impossible to justify a CIR schedule when pt unable to work with us.  8. Will d/w admissions coordinators.  9. Thank you for this consult- hopefully we can admit asap, if pain is better controlled.      I spent a total of 80 minutes on consult total- >50% on coordination of care.  I spent 15 minutes reviewing chart and 20 minutes typing up consult- however spent 50 minutes with pt examining him, having nursing set up his food/drink so he could eat, and d/w admissions coordinators and IM about his care.            Genice RougeMegan Lovorn, MD 11/26/2020
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
## 2020-11-30 NOTE — Evaluation (Signed)
Physical Therapy Assessment and Plan  Patient Details  Name: Daniel Cline MRN: [id number] Date of Birth: Aug 08, 1956  PT Diagnosis: Pain in joint and Quadriplegia Rehab Potential: Fair ELOS: 21 days   Today's Date: 11/30/2020 PT Individual Time: 1400-1500 PT Individual Time Calculation (min): 60 min    Hospital Problem: Principal Problem:   Cervical myelopathy ([place]) Active Problems:   Epidural intraspinal abscess   Hypertension associated with diabetes ([place])   Uncontrolled type 2 diabetes mellitus with hyperglycemia ([place])   Neurogenic bowel   Neurogenic bladder   Past Medical History:  Past Medical History:  Diagnosis Date  . Diabetes mellitus without complication [facility])    Past Surgical History:  Past Surgical History:  Procedure Laterality Date  . I & D EXTREMITY Right 11/20/2020   Procedure: IRRIGATION AND DEBRIDEMENT FOREARM;  Surgeon: Nicholes Stairs, MD;  Location: [place];  Service: Orthopedics;  Laterality: Right;  . POSTERIOR CERVICAL LAMINECTOMY N/A 11/18/2020   Procedure: CERVICAL THREE-FOUR, CERVICAL FOUR-FIVE, CERVICAL FIVE-SIX, CERVICAL SIX-SEVEN CERVICAL LAMINECTOMY FOR EVACUATION OF ABSCESS;  Surgeon: Earnie Larsson, MD;  Location: [place];  Service: Neurosurgery;  Laterality: N/A;    Assessment & Plan Clinical Impression: is a 65 year old male with a history of uncontrolled type 2 diabetes (hemoglobin A1c of 13.9), as well as a history of hypertension, who was admitted to the hospital and found to have a cervical spine epidural abscess.  He was taken emergently to the OR and had a laminectomy and abscess evacuation on admission.  He also was found to have a right forearm abscess that was also drained by I&D by orthopedic surgery.  He was seen by infectious disease due to his abscesses growing out MSSA.  He is on IV antibiotics and is on Ancef to complete a 6 weeks course.  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  Due to that and neurogenic bowel and bladder, and his impaired level of mobility, as well as his severe uncontrolled pain, he was felt to be appropriate to admit for inpatient rehab at [facility] Patient transferred to [place] on 11/28/2020 .   Patient currently requires max with mobility secondary to muscle weakness, muscle joint tightness and muscle paralysis, decreased cardiorespiratoy endurance, impaired timing and sequencing and decreased coordination and decreased sitting balance, decreased standing balance, decreased postural control and decreased balance strategies.  Prior to hospitalization, patient was independent  with mobility and lived alone.  Plans to sell his home, move in permanently with Family,Son in a House home.  Daughter-in-law home for 24hr assist.  Home access is 3, son in process of building rampStairs to enter.  Patient will benefit from skilled PT intervention to maximize safe functional mobility, minimize fall risk and decrease caregiver burden for planned discharge home with 24 hour assist.  Anticipate patient will benefit from follow up [facility] at discharge.  PT - End of Session Activity Tolerance: Tolerates 30+ min activity with multiple rests (only in supine) Endurance Deficit: Yes PT Assessment Rehab Potential (ACUTE/IP ONLY): Fair PT Barriers to Discharge: Home environment access/layout;Neurogenic Bowel & Bladder;Behavior PT Barriers to Discharge Comments: severe pain limiting pt cooperation/participation PT Patient demonstrates impairments in the following area(s): Balance;Behavior;Endurance;Motor;Pain;Safety;Sensory;Skin Integrity PT Transfers Functional Problem(s): Bed Mobility;Bed to Chair;Car PT Locomotion Functional Problem(s): Ambulation;Wheelchair Mobility;Stairs PT Plan PT Intensity: Minimum of 1-2 x/day ,45 to 90 minutes PT Frequency: 5 out of 7 days PT Duration Estimated Length of Stay: 21 days PT  Treatment/Interventions:  Ambulation/gait training;DME/adaptive equipment instruction;Neuromuscular re-education;Psychosocial support;UE/LE Strength taining/ROM;Wheelchair propulsion/positioning;Balance/vestibular training;Discharge planning;Pain management;Therapeutic Activities;UE/LE Coordination activities;Disease management/prevention;Functional mobility training;Patient/family education;Splinting/orthotics;Therapeutic Exercise PT Transfers Anticipated Outcome(s): min assist PT Locomotion Anticipated Outcome(s): min assist household PT Recommendation Follow Up Recommendations: 24 hour supervision/assistance;Home health PT Patient destination: Home (w/son and daughter in law) Equipment Recommended: To be determined   PT Evaluation Precautions/Restrictions Precautions Precautions: Cervical;Fall Cervical Brace: Soft collar;For comfort Restrictions Weight Bearing Restrictions: No General   Vital SignsTherapy Vitals Temp: 100.1 F (37.8 C) Temp Source: Oral Pulse Rate: 89 Resp: 16 BP: 129/73 Patient Position (if appropriate): Lying Oxygen Therapy SpO2: 98 % O2 Device: Room Air Pain Pain Assessment Pain Scale: 0-10 Pain Score: 8  Pain Type: Acute pain Pain Location: Neck (and shoulders L>R) Pain Orientation: Posterior Pain Descriptors / Indicators: Sharp;Shooting Pain Onset: With Activity Home Living/Prior Functioning Home Living Available Help at Discharge: Family;Available 24 hours/day (son, daughter in law) Type of Home: House Home Access: Stairs to enter [employer] of Steps: 3, son in process of building ramp Entrance Stairs-Rails: None Home Layout: One level (going to sons home, one level, one bathroom) Bathroom Shower/Tub: Chiropodist: Standard Bathroom Accessibility: Yes  Lives With: Family;Son Prior Function Level of Independence: Independent with basic ADLs;Independent with transfers;Independent with gait (occasionally used cane)  Able to Take Stairs?:  Yes Driving: Yes Comments: 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 - started having issues with shoulders and back; was doing his own financial and medication managmentsince having problems, son has been helping Vision/Perception     Cognition Overall Cognitive Status: Within Functional Limits for tasks assessed Orientation Level: Oriented X4 Sensation Sensation Light Touch: Appears Intact Proprioception: Impaired by gross assessment (at bilat great toes, intact at ankles) Coordination Gross Motor Movements are Fluid and Coordinated: No Fine Motor Movements are Fluid and Coordinated: No Coordination and Movement Description: limited by pain, proximal weakness, edema and arthritic changes in hands, weakness bilat hands Finger Nose Finger Test: unable Heel Shin Test: limited by weakness Motor  Motor Motor: Tetraplegia;Other (comment) (pain limits bilat shoulder AROM L>R,) Motor - Skilled Clinical Observations: quadraparesis   Trunk/Postural Assessment  Cervical Assessment Cervical Assessment: Exceptions to [facility] (spinal precautions s/p ACDF)  Balance Balance Balance Assessed: No Extremity Assessment  RUE Assessment RUE Assessment: Exceptions to [facility] Passive Range of Motion (PROM) Comments: WFL Active Range of Motion (AROM) Comments: WFL RUE Strength Right Shoulder Flexion: 3-/5 Right Shoulder ABduction: 2-/5 Right Shoulder Internal Rotation: 3+/5 Right Shoulder External Rotation: 3+/5 Right Elbow Flexion: 3+/5 Right Elbow Extension: 3+/5 Right Wrist Flexion: 3+/5 Right Wrist Extension: 3-/5 Right Hand Gross Grasp: Impaired LUE Assessment LUE Assessment: Exceptions to [facility] Passive Range of Motion (PROM) Comments: cannot fully assess L shoulder due to pain w/very limited AROM/PROM LUE Strength LUE Overall Strength: Deficits;Due to pain LUE Overall Strength Comments: testing limited by c/o severe pain in shoulder w/any ROM Left Shoulder Flexion:  (at least, cannot  further assess due to pain) Left Shoulder ABduction:  (at least, cannot further assess due to pain) Left Shoulder Internal Rotation: 2/5 (at least, cannot further assess due to pain) Left Shoulder External Rotation: 2/5 (at least, cannot further assess due to pain) Left Elbow Flexion: 2/5 (at least, cannot further assess due to pain) Left Elbow Extension: 2/5 (at least, cannot further assess due to pain) Left Wrist Flexion: 2/5 Left Wrist Extension: 3/5 Left Hand Gross Grasp: Impaired RLE Assessment RLE Assessment: Exceptions to [facility] RLE Strength RLE Overall  Strength Comments: significant overall deficits Right Hip Flexion: 2/5 Right Hip Extension:  (able to contract in supine, cannot further test due to inability to tolerate other positions) Right Hip ABduction: 2+/5 Right Hip ADduction: 2+/5 Right Knee Flexion: 3+/5 (grossly, tested supine) Right Knee Extension: 3+/5 (grossly, tested supine) Right Ankle Dorsiflexion: 4/5 Right Ankle Plantar Flexion: 4-/5 LLE Assessment LLE Assessment: Exceptions to [facility] LLE Strength LLE Overall Strength: Deficits Left Hip Flexion: 3+/5 (tested supine, gross) Left Hip Extension:  (able to contract in supine, cannot further test due to inability to tolerate other positions) Left Hip ADduction: 2+/5 Left Knee Flexion: 3+/5 (gross, tested supine) Left Knee Extension: 3+/5 (at least, tested supine) Left Ankle Dorsiflexion: 4/5 Left Ankle Plantar Flexion: 4-/5  Care Tool Care Tool Bed Mobility Roll left and right activity   Roll left and right assist level: 2 Helpers    Sit to lying activity Sit to lying activity did not occur: Refused      Lying to sitting edge of bed activity Lying to sitting edge of bed activity did not occur: Refused       Care Tool Transfers Sit to stand transfer Sit to stand activity did not occur: Refused      Chair/bed transfer Chair/bed transfer activity did not occur: Designer, television/film set transfer activity did not occur: Safety/medical concerns        Care Tool Locomotion Ambulation Ambulation activity did not occur: Safety/medical concerns        Walk 10 feet activity Walk 10 feet activity did not occur: Safety/medical concerns       Walk 50 feet with 2 turns activity Walk 50 feet with 2 turns activity did not occur: Safety/medical concerns      Walk 150 feet activity Walk 150 feet activity did not occur: Safety/medical concerns      Walk 10 feet on uneven surfaces activity Walk 10 feet on uneven surfaces activity did not occur: Safety/medical concerns      Stairs Stair activity did not occur: Safety/medical concerns        Walk up/down 1 step activity Walk up/down 1 step or curb (drop down) activity did not occur: Safety/medical concerns     Walk up/down 4 steps activity did not occuR: Safety/medical concerns  Walk up/down 4 steps activity      Walk up/down 12 steps activity Walk up/down 12 steps activity did not occur: Safety/medical concerns      Pick up small objects from floor Pick up small object from the floor (from standing position) activity did not occur: Safety/medical concerns      Wheelchair Will patient use wheelchair at discharge?: Yes Type of Wheelchair: Manual Wheelchair activity did not occur: Refused      Wheel 50 feet with 2 turns activity Wheelchair 50 feet with 2 turns activity did not occur: Refused    Wheel 150 feet activity Wheelchair 150 feet activity did not occur: Refused      Refer to Care Plan for Long Term Goals  SHORT TERM GOAL WEEK 1 PT Short Term Goal 1 (Week 1): Pt will tolerate sitting in appropriate wc x 1 hr to allow for participation in therapy session PT Short Term Goal 2 (Week 1): Pt will roll L/R w/mod assist and cues PT Short Term Goal 3 (Week 1): Pt will tolerate sitting on edge of bed/mat x 10 min w/occasional repositioning if needed and  mod assist  Recommendations for other services:  None   Skilled Therapeutic Intervention  Evaluation completed (see details above and below) with education on PT POC and goals and individual treatment initiated with focus on functional mobility, strength, education, improved endurance with activity Pt verbally oriented to rehab unit.  Discussed process of daily scheduling and receiving schedule, purpose  And team conference schedule and D/c planning.  Pt provided w/ reclining wheelchair and Ulice Dash 2 cushion and adjustments made to promote optimal seating posture and pressure distribution.  Pt performed bilat UE and LE AROM activities in bed, LUE very limited by c/o pain w/any motion above wrist.   Attempted bed mobility, rolling but very limited tolerance 2* pain.  Discussed importance of sitting upright, transferring OOB.  Son present and also encourages his father to participate/attempt.  Pt eventually agreed, but rolls <10* then declines further due to c/o pain.  Attempted raising HOB gradually to promote tolerance for upright, pt tolerates approx 30* then shouts at therapist to stop due to pain.   Discussed options for transfers, pt continues to decline.  Requests to be "lifted w/crane", but when educated re: maxi move, son reminds father that he did not tolerate this in hospital and was very upset after attempts to move him in this manner.  Therapist demonstrated reclining feature of wc and discussed lateral slide transfer bed to reclined wc.  When asked if he would comply w/this next session, pt replies, "we will see what I can do".  Discussed co-morbidities associated w/prolonged bedrest. Pt left supine w/rails up x 4, alarm set, bed in lowest position, and needs in reach.   Mobility Bed Mobility Bed Mobility: Rolling Right;Rolling Left Rolling Right: 2 Helpers (pain limits particiapation) Rolling Left: 2 Helpers (pain limits particiapation) Transfers Transfers:  (pt refused) Locomotion  Gait Ambulation: No Gait Gait: No Stairs /  Additional Locomotion Stairs: No Wheelchair Mobility Wheelchair Mobility: No   Discharge Criteria: Patient will be discharged from PT if patient refuses treatment 3 consecutive times without medical reason, if treatment goals not met, if there is a change in medical status, if patient makes no progress towards goals or if patient is discharged from hospital.  The above assessment, treatment plan, treatment alternatives and goals were discussed and mutually agreed upon: by patient and by family  Jerrilyn Cairo 11/30/2020, 3:54 PM

## 2020-12-01 DIAGNOSIS — E1165 Type 2 diabetes mellitus with hyperglycemia: Secondary | ICD-10-CM

## 2020-12-01 DIAGNOSIS — I152 Hypertension secondary to endocrine disorders: Secondary | ICD-10-CM

## 2020-12-01 DIAGNOSIS — E1159 Type 2 diabetes mellitus with other circulatory complications: Secondary | ICD-10-CM

## 2020-12-01 DIAGNOSIS — G061 Intraspinal abscess and granuloma: Principal | ICD-10-CM

## 2020-12-01 LAB — GLUCOSE, CAPILLARY
Glucose-Capillary: 142 mg/dL — ABNORMAL HIGH (ref 70–99)
Glucose-Capillary: 203 mg/dL — ABNORMAL HIGH (ref 70–99)
Glucose-Capillary: 181 mg/dL — ABNORMAL HIGH (ref 70–99)
Glucose-Capillary: 207 mg/dL — ABNORMAL HIGH (ref 70–99)

## 2020-12-01 LAB — CBC
HCT: 36.4 % — ABNORMAL LOW (ref 39.0–52.0)
Hemoglobin: 12.4 g/dL — ABNORMAL LOW (ref 13.0–17.0)
MCH: 31.7 pg (ref 26.0–34.0)
MCHC: 34.1 g/dL (ref 30.0–36.0)
MCV: 93.1 fL (ref 80.0–100.0)
Platelets: 510 10*3/uL — ABNORMAL HIGH (ref 150–400)
RBC: 3.91 MIL/uL — ABNORMAL LOW (ref 4.22–5.81)
RDW: 13.2 % (ref 11.5–15.5)
WBC: 15.2 10*3/uL — ABNORMAL HIGH (ref 4.0–10.5)
nRBC: 0 % (ref 0.0–0.2)

## 2020-12-01 LAB — BASIC METABOLIC PANEL
Anion gap: 8 (ref 5–15)
BUN: 15 mg/dL (ref 8–23)
CO2: 22 mmol/L (ref 22–32)
Calcium: 8.3 mg/dL — ABNORMAL LOW (ref 8.9–10.3)
Chloride: 103 mmol/L (ref 98–111)
Creatinine, Ser: 0.71 mg/dL (ref 0.61–1.24)
GFR, Estimated: >60 mL/min (ref >60)
Glucose, Bld: 150 mg/dL — ABNORMAL HIGH (ref 70–99)
Potassium: 4.2 mmol/L (ref 3.5–5.1)
Sodium: 133 mmol/L — ABNORMAL LOW (ref 135–145)

## 2020-12-01 MED ORDER — BUTALBITAL-APAP-CAFFEINE 50-325-40 MG PO TABS: 1.0000 | ORAL_TABLET | Freq: Four times a day (QID) | ORAL | Status: DC | PRN

## 2020-12-01 MED ORDER — DIAZEPAM 2 MG PO TABS
4.0000 mg | ORAL_TABLET | Freq: Two times a day (BID) | ORAL | Status: DC
  Administered 2020-12-01 – 2020-12-15 (×29): 4 mg via ORAL
  Filled 2020-12-01 (×29): qty 2

## 2020-12-01 NOTE — Patient Care Conference (Signed)
Inpatient RehabilitationTeam Conference and Plan of Care Update Date: 12/01/2020   Time: 11:37 AM    Patient Name: Daniel Cline      Medical Record Number: [id number]  Date of Birth: 1955/09/29 Sex: Male         Room/Bed: 4W03C/4W03C-01 Payor Info: Payor: /    Admit Date/Time:  11/28/2020  1:22 PM  Primary Diagnosis:  Cervical myelopathy [facility])  Hospital Problems: Principal Problem:   Cervical myelopathy ([facility]) Active Problems:   Epidural intraspinal abscess   Hypertension associated with diabetes ([facility])   Uncontrolled type 2 diabetes mellitus with hyperglycemia ([facility])   Neurogenic bowel   Neurogenic bladder    Expected Discharge Date: Expected Discharge Date:  (3-4 Weeks)  Team Members Present: Physician leading conference: Dr. Genice Rouge Care Coodinator Present: Kennyth Arnold, RN, BSN, CRRN;Becky Dupree, LCSW Nurse Present: Kennyth Arnold, RN PT Present: Otelia Sergeant, PT OT Present: Earleen Newport, OT PPS Coordinator present : Fae Pippin, SLP     Current Status/Progress Goal Weekly Team Focus  Bowel/Bladder   continent of B/B LBM 04/04  remain continent  toilet pt prn   Swallow/Nutrition/ Hydration             ADL's   Max-total all ADLs bathing, dressing, etc. bed pan for toileting, Max for rolling L/R, will not sit EOB or get OOB, pain limiting  Min overall for LB ADLs and transfers  any activity, sitting EOB/getting OOB, supine <> sits, general conditioning, pain management, education   Mobility   refused all transfers; EOB sitting; max A x1 rolling  min A transfers; supervision bed mob; min A gait 50-25'; supervision 150' WC  pain control; bed mob; transfers; gait; WC   Communication             Safety/Cognition/ Behavioral Observations            Pain   pt c/o surgical pain in neck has scheduled MS contin oxy prn and tylenol prn  Pain <=4/10  assess pain qshift and prn medicate as directed inform MD pain not relieved   Skin   BUE bruises MASD of  groin surgical incision in neck abrasion on tip of penis  pt will have healing of MASD no further breakdown of infection  assess skin qshift and prn     Discharge Planning:  Home to son and daugher in-law's home, according to son someone will be there with him. Update once goals set and target DC date   Team Discussion: Added Valium 4 mg BID and Firoicet prn for headache. Will not let staff increase HOB, complains that it increases headache. Added MS Contin for pain. Working on pain control. A1c is 13.9, new insulin. Has neurogenic bowel/bladder, foley in place for now, hope to remove in a week. No reported sleep issues, back incision, MASD to groin, abrasion to tip of penis. Education on diabetes management, insulin injections, and medication management. Discharging to son's home. Patient on target to meet rehab goals: no, evals today due to medical hold over the weekend. Notes state he was a max assist +1 for rolling, bathing was done at bed level. Max/total assist for most everything else. Hope to have min assist goals. Refused to mobilize, refused HOB lifting, tried to educate.   *See Care Plan and progress notes for long and short-term goals.   Revisions to Treatment Plan:  Acute DVT's, started on Apixiban.  Teaching Needs: Family education, medication management, pain management, anxiety management, diabetes education, insulin injection  demonstration, transfer training, gait training, balance training, endurance training, safety awareness, foley care, bowel/ bladder education.  Current Barriers to Discharge: Decreased caregiver support, Medical stability, Home enviroment access/layout, New diabetic, Incontinence, Neurogenic bowel and bladder, Wound care, Lack of/limited family support, Weight, Weight bearing restrictions, Medication compliance and Behavior  Possible Resolutions to Barriers: Continue current medications, provide emotional support.     Medical Summary Current Status:  10/10 HA when tries to sit up- has foley; will try to get out in 1week. DM- A1c 13.9- new insulin; new acute DVTs- on Apixiban  Barriers to Discharge: Behavior;Decreased family/caregiver support;Home enviroment access/layout;Incontinence;Neurogenic Bowel & Bladder;IV antibiotics;Medical stability;Medication compliance;Weight bearing restrictions;Wound care  Barriers to Discharge Comments: SW- going to son/daughter in law's at home- eval today; can only roll in bed- anticipates the pain, fyi- will get pt Neuropsych Possible Resolutions to Barriers/Weekly Focus: added valium 4 mg BID and Firoicet prn; won't let staff increase HOB level; also new MS Contin for pain; working on getting pain controlled; NSU- no CSF leak possible- 3-4 weeks   Continued Need for Acute Rehabilitation Level of Care: The patient requires daily medical management by a physician with specialized training in physical medicine and rehabilitation for the following reasons: Direction of a multidisciplinary physical rehabilitation program to maximize functional independence : Yes Medical management of patient stability for increased activity during participation in an intensive rehabilitation regime.: Yes Analysis of laboratory values and/or radiology reports with any subsequent need for medication adjustment and/or medical intervention. : Yes   I attest that I was present, lead the team conference, and concur with the assessment and plan of the team.   Tennis Must 12/01/2020, 5:31 PM

## 2020-12-01 NOTE — Progress Notes (Signed)
Physical Therapy Session Note  Patient Details  Name: Daniel Cline MRN: [id number] Date of Birth: 02/24/1956  Today's Date: 12/01/2020 PT Individual Time: 1305-1400 PT Individual Time Calculation (min): 55 min   Short Term Goals: Week 1:  PT Short Term Goal 1 (Week 1): Pt will tolerate sitting in appropriate wc x 1 hr to allow for participation in therapy session PT Short Term Goal 2 (Week 1): Pt will roll L/R w/mod assist and cues PT Short Term Goal 3 (Week 1): Pt will tolerate sitting on edge of bed/mat x 10 min w/occasional repositioning if needed and mod assist  Skilled Therapeutic Interventions/Progress Updates:    Pain:  Pt reports cervical pain w/increased upright in wc.  Treatment to tolerance.  Rest breaks and repositioning as needed. Change of enviroment mult times for distraction.  Pt initially supine, lethargic, agreeable to OOB to reliner in reclined position.  Sheet positioned under pt via minimal rolling.  Bed to wc +3 lift using sheet.  Pt posiitoned w/legs elevated using applewood board insert as extesnion to wc seat and pillow to provide offloading between calves and board.   Pt gradually progressed to more upright posture using adjustable seat back.  Increases in elevation approx every 6-8 min to tolerance.  Pt achieved approx 40* upright over course of hr session.  Pt transported to several locations during session as distraction which did allow for continued progression.  At end of session, pt lowered approx 20% and agreed to remain oob in wc x 30 min until next PT session. Pt left oob in wc w/alarm belt set and needs in reach  Use of valium + lateral lift to recliner effective in allowing pt to tolerate OOB x 90 min as above.    Therapy Documentation Precautions:  Precautions Precautions: Cervical,Fall Required Braces or Orthoses: Cervical Brace Cervical Brace: Soft collar,For comfort Restrictions Weight Bearing Restrictions: No   Therapy/Group: Individual  Therapy  Daniel Cline, PT   Daniel Cline 12/01/2020, 3:48 PM

## 2020-12-01 NOTE — Progress Notes (Signed)
ANTICOAGULATION CONSULT NOTE - - follow up  Pharmacy Consult for Eliquis   Indication: DVT  No Known Allergies  Patient Measurements: Height: 5\' 7"  (170.2 cm) Weight: 84.6 kg (186 lb 8.2 oz) IBW/kg (Calculated) : 66.1 Heparin Dosing Weight: 83.2 kg  Vital Signs: Temp: 99.9 F (37.7 C) (04/05 0524) Temp Source: Oral (04/05 0524) BP: 137/80 (04/05 0524) Pulse Rate: 89 (04/05 0524)  Labs: Recent Labs    11/30/20 0733 12/01/20 0718  HGB 12.4* 12.4*  HCT 36.4* 36.4*  PLT 528* 510*  APTT 49*  --   HEPARINUNFRC 1.00*  --   CREATININE 0.67 0.71    Estimated Creatinine Clearance: 95.7 mL/min (by C-G formula based on SCr of 0.71 mg/dL).   Medical History: Past Medical History:  Diagnosis Date  . Diabetes mellitus without complication [facility])     Assessment: 66 year old male presented to ED on 3/23 with chronic neck pain and found to have a cervical spine epidural abscess, had emergent laminectomy and abscess evacuation on admit. Transferred to inpatient rehab on 4/2. Lower extremity 6/2 found acute DVT in RLE, started on apixaban. Today patient with increased cervical posterior incision drainage and severe headache, Neurosurgery consulted and planning for OR I&D and washout later today.   Contacted Neurosurgery and spoke with NP [place], plan to transition from Eliquis to IV heparin for DVT, OR plans delayed until possibly tomorrow given recent Eliquis dose.   No further procedure planned. Resumed apixaban per NS on 11/30/20. Needs 6 more days for Apixaban 10 mg BID thru 4/9 then reduce to 5 mg bid. No s/sx bleeding reported.  CBC is stable   Goal of Therapy:  Monitor platelets by anticoagulation protocol: Yes   Plan:  Adjusted apixaban to 6 days of Apixaban 10mg  PO BID (instead of  5days)  then 5mg  BID Rx will monitor peripherally  6/9, RPh Clinical Pharmacist  [phone] Please check AMION for all [facility] Pharmacy phone numbers After 10:00 PM, call Main  Pharmacy [phone] 12/01/2020 11:36 AM

## 2020-12-01 NOTE — Plan of Care (Signed)
Problem: RH Balance Goal: LTG: Patient will maintain dynamic sitting balance (OT) Description: LTG:  Patient will maintain dynamic sitting balance with assistance during activities of daily living (OT) Flowsheets (Taken 12/01/2020 1255) LTG: Pt will maintain dynamic sitting balance during ADLs with: Supervision/Verbal cueing Goal: LTG Patient will maintain dynamic standing with ADLs (OT) Description: LTG:  Patient will maintain dynamic standing balance with assist during activities of daily living (OT)  Flowsheets (Taken 12/01/2020 1255) LTG: Pt will maintain dynamic standing balance during ADLs with: Minimal Assistance - Patient > 75%   Problem: Sit to Stand Goal: LTG:  Patient will perform sit to stand in prep for activites of daily living with assistance level (OT) Description: LTG:  Patient will perform sit to stand in prep for activites of daily living with assistance level (OT) Flowsheets (Taken 12/01/2020 1255) LTG: PT will perform sit to stand in prep for activites of daily living with assistance level: Minimal Assistance - Patient > 75%   Problem: RH Eating Goal: LTG Patient will perform eating w/assist, cues/equip (OT) Description: LTG: Patient will perform eating with assist, with/without cues using equipment (OT) Flowsheets (Taken 12/01/2020 1255) LTG: Pt will perform eating with assistance level of: Set up assist    Problem: RH Grooming Goal: LTG Patient will perform grooming w/assist,cues/equip (OT) Description: LTG: Patient will perform grooming with assist, with/without cues using equipment (OT) Flowsheets (Taken 12/01/2020 1255) LTG: Pt will perform grooming with assistance level of: Set up assist    Problem: RH Bathing Goal: LTG Patient will bathe all body parts with assist levels (OT) Description: LTG: Patient will bathe all body parts with assist levels (OT) Flowsheets (Taken 12/01/2020 1255) LTG: Pt will perform bathing with assistance level/cueing: Minimal Assistance -  Patient > 75%   Problem: RH Dressing Goal: LTG Patient will perform upper body dressing (OT) Description: LTG Patient will perform upper body dressing with assist, with/without cues (OT). Flowsheets (Taken 12/01/2020 1255) LTG: Pt will perform upper body dressing with assistance level of: Supervision/Verbal cueing Goal: LTG Patient will perform lower body dressing w/assist (OT) Description: LTG: Patient will perform lower body dressing with assist, with/without cues in positioning using equipment (OT) Flowsheets (Taken 12/01/2020 1255) LTG: Pt will perform lower body dressing with assistance level of: Minimal Assistance - Patient > 75%   Problem: RH Toileting Goal: LTG Patient will perform toileting task (3/3 steps) with assistance level (OT) Description: LTG: Patient will perform toileting task (3/3 steps) with assistance level (OT)  Flowsheets (Taken 12/01/2020 1255) LTG: Pt will perform toileting task (3/3 steps) with assistance level: Minimal Assistance - Patient > 75%   Problem: RH Functional Use of Upper Extremity Goal: LTG Patient will use RT/LT upper extremity as a (OT) Description: LTG: Patient will use right/left upper extremity as a stabilizer/gross assist/diminished/nondominant/dominant level with assist, with/without cues during functional activity (OT) Flowsheets (Taken 12/01/2020 1255) LTG: Use of upper extremity in functional activities: RUE as dominant level LTG: Pt will use upper extremity in functional activity with assistance level of: Supervision/Verbal cueing   Problem: RH Toilet Transfers Goal: LTG Patient will perform toilet transfers w/assist (OT) Description: LTG: Patient will perform toilet transfers with assist, with/without cues using equipment (OT) Flowsheets (Taken 12/01/2020 1255) LTG: Pt will perform toilet transfers with assistance level of: Minimal Assistance - Patient > 75%   Problem: RH Tub/Shower Transfers Goal: LTG Patient will perform tub/shower transfers  w/assist (OT) Description: LTG: Patient will perform tub/shower transfers with assist, with/without cues using equipment (OT) Flowsheets (Taken 12/01/2020  1255) LTG: Pt will perform tub/shower stall transfers with assistance level of: Minimal Assistance - Patient > 75%
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
## 2020-12-01 NOTE — Progress Notes (Signed)
[facility]patient [facility] Individual Statement of Services  Patient Name:  Daniel Cline  Date:  12/01/2020  Welcome to the Inpatient Rehabilitation [facility].  Our goal is to provide you with an individualized program based on your diagnosis and situation, designed to meet your specific needs.  With this comprehensive rehabilitation program, you will be expected to participate in at least 3 hours of rehabilitation therapies Monday-Friday, with modified therapy programming on the weekends.  Your rehabilitation program will include the following services:  Physical Therapy (PT), Occupational Therapy (OT), Speech Therapy (ST), 24 hour per day rehabilitation nursing, Neuropsychology, Care Coordinator, Rehabilitation Medicine, Nutrition Services and Pharmacy Services  Weekly team conferences will be held on Tuesday to discuss your progress.  Your Inpatient Rehabilitation Care Coordinator will talk with you frequently to get your input and to update you on team discussions.  Team conferences with you and your family in attendance may also be held.  Expected length of stay: 21-28 days  Overall anticipated outcome: min assist level  Depending on your progress and recovery, your program may change. Your Inpatient Rehabilitation Care Coordinator will coordinate services and will keep you informed of any changes. Your Inpatient Rehabilitation Care Coordinator's name and contact numbers are listed  below.  The following services may also be recommended but are not provided by the Inpatient Rehabilitation Center:   Driving Evaluations  Home Health Rehabiltiation Services  Outpatient Rehabilitation Services    Arrangements will be made to provide these services after discharge if needed.  Arrangements include referral to agencies that provide these services.  Your insurance has been verified to be:  [employer]-Medicare Your primary doctor is:  No PCP  Pertinent information will be shared with your  doctor and your insurance company.  Inpatient Rehabilitation Care Coordinator:  Dossie Der, [place] [phone] or Luna [place]  Information discussed with and copy given to patient by: [name], 12/01/2020, 11:24 AM

## 2020-12-01 NOTE — Progress Notes (Signed)
   Providing Compassionate, Quality Care - Together   Subjective: Patient reports head and nec pain that is worse with sitting up.  Objective: Vital signs in last 24 hours: Temp:  [99.9 F (37.7 C)-100.2 F (37.9 C)] 99.9 F (37.7 C) (04/05 0524) Pulse Rate:  [89-96] 89 (04/05 0524) Resp:  [16-20] 20 (04/04 1945) BP: (120-137)/(68-80) 137/80 (04/05 0524) SpO2:  [94 %-98 %] 95 % (04/05 0524)  Intake/Output from previous day: 04/04 0701 - 04/05 0700 In: 340 [P.O.:240; IV Piggyback:100] Out: 1045 [Urine:1045] Intake/Output this shift: Total I/O In: 200 [P.O.:200] Out: 1350 [Urine:1350]  Alert, appropriate PERRLA CN II-XII grossly intact Speech clear MAE,Strength slightly decreased in BUE, sensation intact RUE edema much improved Incision is covered with gauze; Minimal old serosanguinous drainage  Lab Results: Recent Labs    11/30/20 0733 12/01/20 0718  WBC 14.5* 15.2*  HGB 12.4* 12.4*  HCT 36.4* 36.4*  PLT 528* 510*   BMET Recent Labs    11/30/20 0733 12/01/20 0718  NA 132* 133*  K 3.1* 4.2  CL 98 103  CO2 25 22  GLUCOSE 171* 150*  BUN 13 15  CREATININE 0.67 0.71  CALCIUM 8.4* 8.3*    Studies/Results: CT CERVICAL SPINE WO CONTRAST  Result Date: 11/29/2020 CLINICAL DATA:  Epidural abscess. EXAM: CT CERVICAL SPINE WITHOUT CONTRAST TECHNIQUE: Multidetector CT imaging of the cervical spine was performed without intravenous contrast. Multiplanar CT image reconstructions were also generated. COMPARISON:  MRI cervical spine 11/18/2020. FINDINGS: Alignment: No significant listhesis is present. Straightening of the normal cervical lordosis again noted. Skull base and vertebrae: Craniocervical junction normal. Wide laminectomy noted C3-C7. Facet alignment remains. Soft tissues and spinal canal: Persistent heterogeneous fluid collections are present the left paraspinous musculature. Central fluid collection now present dorsal to the thecal sac along the surgical  tract. Disc levels: Leftward disc osteophyte complex is again seen at L3-4 and L4-5. Foraminal narrowing stable. Upper chest: Bilateral pleural effusions are present. No pneumothorax present. Left-sided PICC line noted. IMPRESSION: 1. Persistent heterogeneous fluid collections in the left paraspinous musculature concerning for infection. 2. Decompression of the spinal canal with laminectomies C3 through C7. 3. Central fluid collection now present dorsal to the thecal sac along the surgical tract. 4. 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 Bilateral foraminal narrowing is stable. 5. Bilateral pleural effusions. Electronically Signed   By: Marin Roberts M.D.   On: 11/29/2020 20:47    Assessment/Plan: He underwent emergent partial C2andcomplete C3, C4, C5, C6, C7 laminectomy withevacuation of epidural abscess on 11/18/2020 by Dr. Jordan Likes. Dr. Aundria Rud of orthopedics performed an incision and drainage of the patient's right forearm on 11/20/2020. Patient admitted to [facility] CIR on 11/29/2020. Developed copious amounts of drainage on 11/29/2020. This was felt to be a seroma as there was no evidence of CSF leak during the surgery. Drainage appears to have resolved as of today. Head and neck pain likely meningeal irritation secondary to severe infection.    LOS: 3 days       Val Eagle, DNP, AGNP-C Nurse Practitioner  [facility] [address], [place], [place] [address] P: [phone]    F: [phone]  12/01/2020, 9:35 AM

## 2020-12-01 NOTE — Progress Notes (Signed)
PROGRESS NOTE   Subjective/Complaints:   Pt reports he has a HA while laying supine, but it skyrockets in pain when tries to sit up >30 degrees in bed.   Said HA is back/occipital and top of head and neck pain.    ROS:  Pt denies SOB, abd pain, CP, N/V/C/D, and vision changes   Objective:   CT CERVICAL SPINE WO CONTRAST  Result Date: 11/29/2020 CLINICAL DATA:  Epidural abscess. EXAM: CT CERVICAL SPINE WITHOUT CONTRAST TECHNIQUE: Multidetector CT imaging of the cervical spine was performed without intravenous contrast. Multiplanar CT image reconstructions were also generated. COMPARISON:  MRI cervical spine 11/18/2020. FINDINGS: Alignment: No significant listhesis is present. Straightening of the normal cervical lordosis again noted. Skull base and vertebrae: Craniocervical junction normal. Wide laminectomy noted C3-C7. Facet alignment remains. Soft tissues and spinal canal: Persistent heterogeneous fluid collections are present the left paraspinous musculature. Central fluid collection now present dorsal to the thecal sac along the surgical tract. Disc levels: Leftward disc osteophyte complex is again seen at L3-4 and L4-5. Foraminal narrowing stable. Upper chest: Bilateral pleural effusions are present. No pneumothorax present. Left-sided PICC line noted. IMPRESSION: 1. Persistent heterogeneous fluid collections in the left paraspinous musculature concerning for infection. 2. Decompression of the spinal canal with laminectomies C3 through C7. 3. Central fluid collection now present dorsal to the thecal sac along the surgical tract. 4. 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 Bilateral foraminal narrowing is stable. 5. Bilateral pleural effusions. Electronically Signed   By: Marin Roberts M.D.   On: 11/29/2020 20:47   Recent Labs    11/30/20 0733 12/01/20 0718  WBC 14.5* 15.2*  HGB 12.4* 12.4*  HCT 36.4*  36.4*  PLT 528* 510*   Recent Labs    11/30/20 0733 12/01/20 0718  NA 132* 133*  K 3.1* 4.2  CL 98 103  CO2 25 22  GLUCOSE 171* 150*  BUN 13 15  CREATININE 0.67 0.71  CALCIUM 8.4* 8.3*    Intake/Output Summary (Last 24 hours) at 12/01/2020 0946 Last data filed at 12/01/2020 0813 Gross per 24 hour  Intake 540 ml  Output 2395 ml  Net -1855 ml        Physical Exam: Vital Signs Blood pressure 137/80, pulse 89, temperature 99.9 F (37.7 C), temperature source Oral, resp. rate 20, height 5\' 7"  (1.702 m), weight 84.6 kg, SpO2 95 %.   General: awake, alert, appropriate, very HOH- congenital, laying supine without pillow behind his headNAD HENT: conjugate gaze; oropharynx moist, however incision posteriorly looks much better- no significant drainage- trace serosanguinous drainage on dressing- incision slightly erythematous, but not significant.  CV: regular rate; no JVD Pulmonary: CTA B/L; no W/R/R- good air movement GI: soft, NT, ND, (+)BS Psychiatric: appropriate- apprehensive? Vs in 10/10 pain? Hard to tell Neurological: Ox3?- some short term memory- delayed processing  Musculoskeletal:     Comments: LUE-strength in his Left arm is 4-/5-in the deltoid, bicep, tricep, wrist extension, grip, and finger abduction RUE-deltoid bicep and tricep are 3/5, wrist extension and grip are 3-/5, and finger abduction is 1/5.  LLE-4/5 in hip flexion, knee extension, and dorsiflexion as well as plantarflexion RLE-strength is  4-/5-in the same muscles No change in strength exam.  Skin:    General: Skin is warm and dry.     Comments: His right forearm has 2 sets of sutures that look great; the right forearm has decreased swelling and trace bruising; His bottom looks fantastic with no breakdown He has excoriation and M ASD in the groin left greater than right His heels have no breakdown or puffiness PICC line in his left upper extremity looks good  Neurological:  He reports his light touch is  intact in all 4 extremities He has no Hoffmann's bilaterally He has no lower extremity clonus bilaterally  Assessment/Plan: 1. Functional deficits which require 3+ hours per day of interdisciplinary therapy in a comprehensive inpatient rehab setting.  Physiatrist is providing close team supervision and 24 hour management of active medical problems listed below.  Physiatrist and rehab team continue to assess barriers to discharge/monitor patient progress toward functional and medical goals  Care Tool:  Bathing              Bathing assist       Upper Body Dressing/Undressing Upper body dressing        Upper body assist      Lower Body Dressing/Undressing Lower body dressing            Lower body assist       Toileting Toileting    Toileting assist       Transfers Chair/bed transfer  Transfers assist  Chair/bed transfer activity did not occur: Refused        Locomotion Ambulation   Ambulation assist   Ambulation activity did not occur: Safety/medical concerns          Walk 10 feet activity   Assist  Walk 10 feet activity did not occur: Safety/medical concerns        Walk 50 feet activity   Assist Walk 50 feet with 2 turns activity did not occur: Safety/medical concerns         Walk 150 feet activity   Assist Walk 150 feet activity did not occur: Safety/medical concerns         Walk 10 feet on uneven surface  activity   Assist Walk 10 feet on uneven surfaces activity did not occur: Safety/medical concerns         Wheelchair     Assist Will patient use wheelchair at discharge?: Yes Type of Wheelchair: Manual Wheelchair activity did not occur: Refused         Wheelchair 50 feet with 2 turns activity    Assist    Wheelchair 50 feet with 2 turns activity did not occur: Refused       Wheelchair 150 feet activity     Assist  Wheelchair 150 feet activity did not occur: Refused       Blood  pressure 137/80, pulse 89, temperature 99.9 F (37.7 C), temperature source Oral, resp. rate 20, height 5\' 7"  (1.702 m), weight 84.6 kg, SpO2 95 %.  Medical Problem List and Plan: 1.  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 Imaging? 2.  Antithrombotics: -DVT/anticoagulation:  Pharmaceutical: Eliquis restarted due to acute DVT in peroneal/soleus             -antiplatelet therapy: N/A 3. Pain Management: Patient is already receiving oxycodone as needed; we started tramadol scheduled but that is not working; will DC tramadol and schedule MS Contin 15 mg twice daily  4/5- will add Valium scheduled for muscle spasms?  4. Mood: - N/A- will see if pt needs to work with Neuropsychology due to uncontrolled pain and new SCI.              -antipsychotic agents: N/A 5. Neuropsych: This patient is capable of making decisions on his own behalf. 6. Skin/Wound Care: We will change his honeycomb dressing on his neck to daily and as needed; will also change the dressing on his right forearm to just a dry dressing and change as needed. Less drainage today, will not require I&D 7. Fluids/Electrolytes/Nutrition: We will check labs on Monday 8. Uncontrolled Diabetes II- A1c of 13.9-continue patient's Levemir 10 units nightly and sliding scale insulin And monitor 9. Hypertension-patient is on HCTZ 25 mg daily as well as Norvasc 10 mg daily and lisinopril 40 mg daily and Lopressor 50 mg twice daily-blood pressure is well controlled we will continue these medications and monitor especially with increased activity  4/5- BP well controlled- con't regimen 10.  Neurogenic bowel-we will look to start him on a bowel program  nightly after dinner with Dulcolax suppositories and digital stimulation  4/5- on nightly bowel program 12.  Acute DVT in the soleus and peroneal-we will change his Lovenox to apixaban/Eliquis 10 mg BID x 1 week, then 5 mg BID.  13.  Neurogenic bladder-we will continue his Foley for now and start Flomax tomorrow 0.4 mg q. supper and see if he is able to start to void;  we might need to titrate this as required- will try and remove foley next week.  14. Hypokalemia: supplement 59me BID today and repeat BMP tomorrow 15. Epidural abscess-  4/5- on  Cefazolin IV for prolonged IV ABX- pt's WBC down to 15.5 (was 14k yesterday, but 20.8 3 days ago)- low grade temps up to 100.2 in last 24 hours-will con't regimen   LOS: 3 days A FACE TO FACE EVALUATION WAS PERFORMED  Kumari Sculley 12/01/2020, 9:46 AM

## 2020-12-01 NOTE — IPOC Note (Signed)
Overall Plan of Care [facility]) Patient Details Name: Daniel Cline MRN: [id number] DOB: 1956-02-09  Admitting Diagnosis: Cervical myelopathy [facility])  Hospital Problems: Principal Problem:   Cervical myelopathy (HCC) Active Problems:   Epidural intraspinal abscess   Hypertension associated with diabetes (HCC)   Uncontrolled type 2 diabetes mellitus with hyperglycemia (HCC)   Neurogenic bowel   Neurogenic bladder     Functional Problem List: Nursing Bladder,Endurance,Edema,Motor,Pain,Safety,Skin Integrity  PT Balance,Behavior,Endurance,Motor,Pain,Safety,Sensory,Skin Integrity  OT Balance,Cognition,Endurance,Motor,Pain,Sensory,Safety,Perception  SLP    TR         Basic ADL's: OT Eating,Grooming,Bathing,Dressing,Toileting     Advanced  ADL's: OT       Transfers: PT Bed Mobility,Bed to Chair,Car  OT Toilet,Tub/Shower     Locomotion: PT Ambulation,Wheelchair Mobility,Stairs     Additional Impairments: OT Fuctional Use of Upper Extremity  SLP        TR      Anticipated Outcomes Item Anticipated Outcome  Self Feeding Set up  Swallowing      Basic self-care  Min A overall  Toileting  Min A   Bathroom Transfers Min A  Bowel/Bladder  Pt will regain bowel/bladder function during hosptial stay  Transfers  min assist  Locomotion  min assist household  Communication     Cognition     Pain  Pt pain will be controlled at tolerable level during hospital stay  Safety/Judgment  Pt will maintain safety/judgement awareness during hospital stay   Therapy Plan: PT Intensity: Minimum of 1-2 x/day ,45 to 90 minutes PT Frequency: 5 out of 7 days PT Duration Estimated Length of Stay: 21 days OT Intensity: Minimum of 1-2 x/day, 45 to 90 minutes OT Frequency: 5 out of 7 days OT Duration/Estimated Length of Stay: 21-28 days     Due to the current state of emergency, patients may not be receiving their 3-hours of Medicare-mandated therapy.   Team  Interventions: Nursing Interventions Patient/Family Education,Pain Management,Dysphagia/Aspiration Precaution Training,Bladder Management,Skin Care/Wound Management,Psychosocial Support  PT interventions Ambulation/gait training,DME/adaptive equipment instruction,Neuromuscular re-education,Psychosocial support,UE/LE Strength taining/ROM,Wheelchair propulsion/positioning,Balance/vestibular training,Discharge planning,Pain management,Therapeutic Activities,UE/LE Coordination activities,Disease management/prevention,Functional mobility training,Patient/family education,Splinting/orthotics,Therapeutic Exercise  OT Interventions Balance/vestibular training,Discharge planning,Functional electrical stimulation,Pain management,Self Care/advanced ADL retraining,Therapeutic Activities,UE/LE Coordination activities,Visual/perceptual remediation/compensation,Therapeutic Exercise,Skin care/wound managment,Patient/family education,Functional mobility training,Disease mangement/prevention,Cognitive remediation/compensation,Community Teaching laboratory technician re-education,Psychosocial support,Splinting/orthotics,UE/LE Strength taining/ROM,Wheelchair propulsion/positioning  SLP Interventions    TR Interventions    SW/CM Interventions Discharge Planning,Psychosocial Support,Patient/Family Education   Barriers to Discharge MD  Medical stability, Home enviroment access/loayout, IV antibiotics, Incontinence, Neurogenic bowel and bladder, Wound care, Lack of/limited family support, Behavior and rates his pain 10/10- and will not sit up  Nursing Incontinence,IV antibiotics,Nutrition means    PT Home environment access/layout,Neurogenic Bowel & Bladder,Behavior severe pain limiting pt cooperation/participation  OT      SLP      SW Insurance for SNF coverage Can not go to SNF from here due to insurance   Team Discharge Planning: Destination: PT-Home (w/son and daughter in Social worker) ,OT-  Home , SLP-  Projected Follow-up: PT-24 hour supervision/assistance,Home health PT, OT-  Home health OT, SLP-  Projected Equipment Needs: PT-To be determined, OT- To be determined, SLP-  Equipment Details: PT- , OT-  Patient/family involved in discharge planning: PT- Patient,Family member/caregiver,  OT-Patient, SLP-   MD ELOS: ~ 4 weeks? Medical Rehab Prognosis:  Guarded Assessment: Pt is a 65 yr old male with DM with A1c of 13.9 with new epidural abscess s/p decompression/evacuation- with incomplete quadriplegia and neurogenic bowel and bladder, and pain "10/10" and severe  HA/neck pain- have started MS Contin 15 mg BID, added Valium 4 mg BID and Fioricet prn;  Pt so far has not sat up at all in bed; working on pain control and therapies.  Goals min A IF we can get pt to sit/get upright in the next few days.     See Team Conference Notes for weekly updates to the plan of care

## 2020-12-01 NOTE — Progress Notes (Signed)
Physical Therapy Session Note  Patient Details  Name: Daniel Cline MRN: [id number] Date of Birth: 07-06-1956  Today's Date: 12/01/2020 PT Individual Time: 1000-1044 and 1430-1503 PT Individual Time Calculation (min): 44 min and 33 mins  Short Term Goals: Week 1:  PT Short Term Goal 1 (Week 1): Pt will tolerate sitting in appropriate wc x 1 hr to allow for participation in therapy session PT Short Term Goal 2 (Week 1): Pt will roll L/R w/mod assist and cues PT Short Term Goal 3 (Week 1): Pt will tolerate sitting on edge of bed/mat x 10 min w/occasional repositioning if needed and mod assist  Skilled Therapeutic Interventions/Progress Updates:    pt received in bed and agreeable to therapy however reported 9/10 pain at posterior, neck, Lt shoulder, and overall headache reporting "nothing makes it better." nursing present during session to give pain medication. Pt adamantly refused bed mobility despite best attempts and education on therapy schedule importance of mobility as tolerated and appropriate. Pt only agreeable to BLE strengthening in bed of 3x10 heel slides, hip abduction, hip adduction, ankle pumps, glute squeezes, Rt bicep curls. Pt required extra time 2/2 pain and needs of rest breaks post each set. Pt left in supine as found, All needs in reach and in good condition. Call light in hand.  And alarm set.   Session 2: pt received in WC reclined and requesting to return to supine.  Nursing present for IV management. PT and x2 additional people assisting in supine sheet transfer to bedside total A x3 as pt is unable to tolerate any trunk flexion for sitting however with this transfer method pt able to transfer to [facility] for increased tolerance OOB, in WC for mobility out of room, and sit at ~40 degrees upright in reclined WC. Once transferred back to bed pt required rest break for pain management. Pt directed in intervals of increased degrees of HOB elevation for improved tolerance to sitting  upright. Pt started at 20 degrees for 2 mins, 23 degrees at 3 mins 26 degrees at 3 mins, 29 degrees for 3 mins and 32 degrees for 1 mins however at this height pt unable to tolerate any longer and requested to decrease HOB. HOB returned to 28 degrees. Pt reported comfort and no increased pain. Pt however did report his pain has stayed constant at 8-9/10 with headache/neck/ shoulder pain. Pt reported improved comfort unmoving at this [facility] elevation. Pt very politely declined to attempt any additional movement and requested to remain in this position. Pt left in bed, All needs in reach and in good condition. Call light in hand.  And alarm set. Pt missed 27 mins of PT.   Therapy Documentation Precautions:  Precautions Precautions: Cervical,Fall Cervical Brace: Soft collar,For comfort Restrictions Weight Bearing Restrictions: No General: PT Amount of Missed Time (min): 16 Minutes PT Missed Treatment Reason: Pain;Patient unwilling to participate Vital Signs:   Pain: Pain Assessment Pain Scale: 0-10 Pain Score: 9  Pain Type: Acute pain Pain Location: Neck Pain Orientation: Posterior Pain Radiating Towards: shoulder, head, back Pain Descriptors / Indicators: Sharp Pain Frequency: Intermittent Pain Onset: On-going Patients Stated Pain Goal: 2 Pain Intervention(s): Medication (See eMAR) Multiple Pain Sites: Yes 2nd Pain Site Pain Score: 9 Pain Type: Acute pain Pain Location: Shoulder Pain Orientation: Left Pain Descriptors / Indicators: Sharp Pain Onset: On-going Pain Intervention(s): RN made aware Mobility:   Locomotion :    Trunk/Postural Assessment :    Balance:   Exercises:   Other  Treatments:      Therapy/Group: Individual Therapy  Barbaraann Faster 12/01/2020, 10:59 AM

## 2020-12-02 DIAGNOSIS — G959 Disease of spinal cord, unspecified: Secondary | ICD-10-CM

## 2020-12-02 DIAGNOSIS — E1159 Type 2 diabetes mellitus with other circulatory complications: Secondary | ICD-10-CM

## 2020-12-02 DIAGNOSIS — I152 Hypertension secondary to endocrine disorders: Secondary | ICD-10-CM

## 2020-12-02 DIAGNOSIS — G061 Intraspinal abscess and granuloma: Principal | ICD-10-CM

## 2020-12-02 DIAGNOSIS — E1165 Type 2 diabetes mellitus with hyperglycemia: Secondary | ICD-10-CM

## 2020-12-02 DIAGNOSIS — N319 Neuromuscular dysfunction of bladder, unspecified: Secondary | ICD-10-CM

## 2020-12-02 DIAGNOSIS — K592 Neurogenic bowel, not elsewhere classified: Secondary | ICD-10-CM

## 2020-12-02 LAB — CBC
HCT: 37.9 % — ABNORMAL LOW (ref 39.0–52.0)
Hemoglobin: 12.7 g/dL — ABNORMAL LOW (ref 13.0–17.0)
MCH: 31.4 pg (ref 26.0–34.0)
MCHC: 33.5 g/dL (ref 30.0–36.0)
MCV: 93.6 fL (ref 80.0–100.0)
Platelets: 476 10*3/uL — ABNORMAL HIGH (ref 150–400)
RBC: 4.05 MIL/uL — ABNORMAL LOW (ref 4.22–5.81)
RDW: 13.4 % (ref 11.5–15.5)
WBC: 14.4 10*3/uL — ABNORMAL HIGH (ref 4.0–10.5)
nRBC: 0 % (ref 0.0–0.2)

## 2020-12-02 LAB — GLUCOSE, CAPILLARY
Glucose-Capillary: 173 mg/dL — ABNORMAL HIGH (ref 70–99)
Glucose-Capillary: 181 mg/dL — ABNORMAL HIGH (ref 70–99)
Glucose-Capillary: 180 mg/dL — ABNORMAL HIGH (ref 70–99)
Glucose-Capillary: 218 mg/dL — ABNORMAL HIGH (ref 70–99)

## 2020-12-02 MED ORDER — INSULIN DETEMIR 100 UNIT/ML ~~LOC~~ SOLN
12.0000 [IU] | Freq: Every day | SUBCUTANEOUS | Status: DC
  Administered 2020-12-02 – 2020-12-03 (×2): 12 [IU] via SUBCUTANEOUS
  Filled 2020-12-02 (×3): qty 0.12

## 2020-12-02 NOTE — Progress Notes (Addendum)
Physical Therapy Session Note  Patient Details  Name: Daniel Cline MRN: [id number] Date of Birth: 01-29-1956  Today's Date: 12/02/2020 PT Individual Time: 1415-1520 and 1000-1100 PT Individual Time Calculation (min): 65 min and 60 min  Short Term Goals: Week 1:  PT Short Term Goal 1 (Week 1): Pt will tolerate sitting in appropriate wc x 1 hr to allow for participation in therapy session PT Short Term Goal 2 (Week 1): Pt will roll L/R w/mod assist and cues PT Short Term Goal 3 (Week 1): Pt will tolerate sitting on edge of bed/mat x 10 min w/occasional repositioning if needed and mod assist  Skilled Therapeutic Interventions/Progress Updates:   AM SESSION   Pain:  Pt reports 6/10 pain neck/L shoulder.  Treatment to tolerance.  Rest breaks and repositioning as needed.  Pt initially supine and agreeable to treatment session w/tilt table to promote upright tolerance, mobility. Pt introduced to and educated re: purpose of tilt table.  Pt able to roll L/R w/min assist for positioning of maxi slide.  Total assist bed to tilt table via slide.   Pt secured x 3 and gradually brought to a max tolerance of 55* in approx 10-15 degree increments x 4, 5-6 min at each level.  Vitals: 115/74 at rest in supine.  No significant change w/progress until reaching 55*  at 55* HR 91 88/62 At 40*  BP 92/68 HR 87 Flat 104/70 Hr 87  Pt nauseated when elevated to 55*, OH noted above.  LUE increased pain and c/o numbness.  OH Symptoms relieved when returned to supine, LUE discomfort persists, LUE supported w/pillow.  Pt assisted back to bed as above.  Repositioned for comfort, Pt left supine w/rails up x 4, alarm set, bed in lowest position, and needs in reach.     PM SESSION Pain:  Pt reports L shoulder/neck pain w/mobility.  Treatment to tolerance.  Rest breaks and repositioning as needed.    Pt initially supine and agreeable to treatment session w/focus on tolerance for OOB, general strendthening.   Therapist applied bilat thigh high ted hose.   Pt rolls w/min assist for placement of sheet for use w/transfers. Supine therex: Heel slides x 15, LLE remains in ER at hip Hip abd/add x 15  Transfer bed to/from wc bed and wc w/approx 15* incline- 3 person lift using sheet.  Pt tolerated progressive increase in upright to approx 45*-50* during 45 min OOB in wc. therex while seated w/LEs supported w/applewood board extension + dowel/wedge as needed for therex. TKEs x 20 w/2 -3 sec hold Glut sets, clamshells x 15-20 each w/feet secured on wedge w/feet stabilized w/dysom W/RUE supported on hi/lo table and pillowcase to prevent friction : R UE "punches" + retraction x 20 Elbow flexion/extension x 20  Pt transported to room, returned to bed as above. Pt left supine w/rails up x 4, alarm set, bed in lowest position, and needs in reach. Nurse at bedside.  Therapy Documentation Precautions:  Precautions Precautions: Cervical,Fall Required Braces or Orthoses: Cervical Brace Cervical Brace: Soft collar,For comfort Restrictions Weight Bearing Restrictions: No   Therapy/Group: Individual Therapy  Rada Hay, PT   [name] 12/02/2020, 3:36 PM

## 2020-12-02 NOTE — Progress Notes (Signed)
Occupational Therapy Session Note  Patient Details  Name: Daniel Cline MRN: [id number] Date of Birth: 1956/01/30  Today's Date: 12/02/2020 OT Individual Time: 7846-9629 OT Individual Time Calculation (min): 54 min    Short Term Goals: Week 1:  OT Short Term Goal 1 (Week 1): Pt will transition supine > sit Max A of 1 in prep for seated ADL OT Short Term Goal 2 (Week 1): Pt will perform seated EOB/EOM activity or ADL for 5 minutes OT Short Term Goal 3 (Week 1): Pt will improve R grip strength to 20# to improve self feeding/grooming OT Short Term Goal 4 (Week 1): Pt will perform UB dress with Mod A of 1  Skilled Therapeutic Interventions/Progress Updates:    Pt greeted at time of session supine in bed resting agreeable to OT session w/ encouragement but first stating he felt worse today than yesterday, however was able to participate more today. Agreeable to bed level activity only despite repeated education on importance of sitting up. Bed level ADL for bathing/dressing with Max/total overall, hand over hand to wash UB and face, able to bring LEs up to self w/ hip and knee flexion, figure four for LLE only. Dressing with gown only, Max A but able to assist more so with doffing with extended time and encouragement. Focus of remaining session on scooting up in bed, 2 helpers required. Able to tolerate up to 39 degrees of elevation for HOB during ADL. Provided with handled cup to improve ability to grasp and increase PO intake/fluids. Continued to need Min A with drinking using AE. Pt in bed resting with alarm on call bell in reach.   Therapy Documentation Precautions:  Precautions Precautions: Cervical,Fall Required Braces or Orthoses: Cervical Brace Cervical Brace: Soft collar,For comfort Restrictions Weight Bearing Restrictions: No    Therapy/Group: Individual Therapy  Erasmo Score 12/02/2020, 7:14 AM

## 2020-12-02 NOTE — Progress Notes (Signed)
                                                       PROGRESS NOTE   Subjective/Complaints:   Pt reports that the Valium has been helpful for pain-initially he reports/ found that the pain is no better than it was yesterday, however then he said he misunderstood the question and did admit that things are somewhat better. He is sitting upright about 30 to 40 degrees with the head of the bed up, ready to eat breakfast and is tolerating that level of the head of the bed. He still complains of a headache as well as posterior neck pain but appears more comfortable. Last bowel movement was  with his bowel program last night.  ROS:   Pt denies SOB, abd pain, CP, N/V/C/D, and vision changes   Objective:   No results found. Recent Labs    12/01/20 0718 12/02/20 0613  WBC 15.2* 14.4*  HGB 12.4* 12.7*  HCT 36.4* 37.9*  PLT 510* 476*   Recent Labs    11/30/20 0733 12/01/20 0718  NA 132* 133*  K 3.1* 4.2  CL 98 103  CO2 25 22  GLUCOSE 171* 150*  BUN 13 15  CREATININE 0.67 0.71  CALCIUM 8.4* 8.3*    Intake/Output Summary (Last 24 hours) at 12/02/2020 1900 Last data filed at 12/02/2020 1720 Gross per 24 hour  Intake 168 ml  Output 1075 ml  Net -907 ml        Physical Exam: Vital Signs Blood pressure 121/65, pulse 85, temperature 98.1 F (36.7 C), resp. rate 17, height 5' 7" (1.702 m), weight 84.6 kg, SpO2 94 %.    General: awake, alert, appropriate, sitting up at 30 to 40 degrees head of bed; appears a little more comfortable;  NAD HENT: conjugate gaze; oropharynx moist CV: regular rate; no JVD Pulmonary: CTA B/L; no W/R/R- good air movement GI: soft, NT, ND, (+)BS- hypoactive Psychiatric: appropriate-hard of hearing; however a little bit more interactive Neurological: Ox3-delayed processing  Musculoskeletal:     Comments: LUE-strength in his Left arm is 4-/5-in the deltoid, bicep, tricep, wrist extension, grip, and finger abduction RUE-deltoid bicep and tricep  are 3/5, wrist extension and grip are 3-/5, and finger abduction is 1/5.  LLE-4/5 in hip flexion, knee extension, and dorsiflexion as well as plantarflexion RLE-strength is 4-/5-in the same muscles Not assessed today Skin:    General: Skin is warm and dry.     Comments: His right forearm has 2 sets of sutures that look great; the right forearm has decreased swelling and trace bruising; His bottom looks fantastic with no breakdown He has excoriation and M ASD in the groin left greater than right His heels have no breakdown or puffiness PICC line in his left upper extremity looks good  Neurological:  He reports his light touch is intact in all 4 extremities He has no Hoffmann's bilaterally He has no lower extremity clonus bilaterally  Assessment/Plan: 1. Functional deficits which require 3+ hours per day of interdisciplinary therapy in a comprehensive inpatient rehab setting.  Physiatrist is providing close team supervision and 24 hour management of active medical problems listed below.  Physiatrist and rehab team continue to assess barriers to discharge/monitor patient progress toward functional and medical goals  Care Tool:  Bathing    Bathing activity did not occur: Safety/medical concerns (Per OT eval on medical hold) Body parts bathed by patient: Chest,Face,Left arm,Abdomen,Left upper leg,Right upper leg   Body parts bathed by helper: Front perineal area,Buttocks,Left upper leg,Right lower leg,Left lower leg,Right arm,Right upper leg     Bathing assist Assist Level: Total Assistance - Patient < 25%     Upper Body Dressing/Undressing Upper body dressing   What is the patient wearing?: Hospital gown only    Upper body assist Assist Level: Maximal Assistance - Patient 25 - 49%    Lower Body Dressing/Undressing Lower body dressing      What is the patient wearing?: Pants     Lower body assist Assist for lower body dressing: Dependent - Patient 0%      Toileting Toileting    Toileting assist Assist for toileting: Dependent - Patient 0% (per staff  report, declined at eval. also limited by pain) Assistive Device Comment: foley   Transfers Chair/bed transfer  Transfers assist  Chair/bed transfer activity did not occur: Refused        Locomotion Ambulation   Ambulation assist   Ambulation activity did not occur: Safety/medical concerns          Walk 10 feet activity   Assist  Walk 10 feet activity did not occur: Safety/medical concerns        Walk 50 feet activity   Assist Walk 50 feet with 2 turns activity did not occur: Safety/medical concerns         Walk 150 feet activity   Assist Walk 150 feet activity did not occur: Safety/medical concerns         Walk 10 feet on uneven surface  activity   Assist Walk 10 feet on uneven surfaces activity did not occur: Safety/medical concerns         Wheelchair     Assist Will patient use wheelchair at discharge?: Yes Type of Wheelchair: Manual Wheelchair activity did not occur: Refused  Wheelchair assist level: Dependent - Patient 0%      Wheelchair 50 feet with 2 turns activity    Assist    Wheelchair 50 feet with 2 turns activity did not occur: Refused   Assist Level: Dependent - Patient 0%   Wheelchair 150 feet activity     Assist  Wheelchair 150 feet activity did not occur: Refused   Assist Level: Dependent - Patient 0%   Blood pressure 121/65, pulse 85, temperature 98.1 F (36.7 C), resp. rate 17, height 5' 7" (1.702 m), weight 84.6 kg, SpO2 94 %.  Medical Problem List and Plan: 1.  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 Imaging?  4/6- con't PT and OT- [facility] feels no imaging is required- thinks its meningeal irritation- doing better today. 2.  Antithrombotics: -DVT/anticoagulation:  Pharmaceutical: Eliquis restarted due to acute DVT in peroneal/soleus             -antiplatelet therapy: N/A 3. Pain Management: Patient is already receiving oxycodone as needed; we started tramadol scheduled but that is not working; will DC tramadol and  schedule MS Contin 15 mg twice daily  4/5- will add Valium scheduled for muscle spasms?   4/6-Valium 4 mg twice a day, as well as the new as needed Fioricet appears to be helping his headaches and neck pain-continue regimen; will titrate as required 4. Mood: - N/A- will see if pt needs to work with Neuropsychology due to uncontrolled pain and new SCI.              -antipsychotic agents: N/A 5. Neuropsych: This patient is capable of making decisions on his own behalf. 6. Skin/Wound Care: We will change his honeycomb dressing on his neck to daily and as needed; will also change the dressing on his right forearm to just a dry dressing and change as needed. Less drainage today, will not require I&D 7. Fluids/Electrolytes/Nutrition: We will check labs on Monday 8. Uncontrolled Diabetes II- A1c of 13.9-continue patient's Levemir 10 units nightly and sliding scale insulin  4/6-blood sugars are running 170s to 207-we will increase his Levemir to 12 units nightly and monitor 9. Hypertension-patient is on HCTZ 25 mg daily as well as Norvasc 10 mg daily and lisinopril 40 mg daily and Lopressor 50 mg twice daily-blood pressure is well controlled we will continue these medications and monitor especially with increased activity  4/6- BP is well controlled-continue regimen 10.  Neurogenic bowel-we will look to start him on a bowel program nightly after dinner with Dulcolax suppositories and digital stimulation  4/5- on nightly bowel program  4/6-having results with nightly bowel  program per patient-we will continue regimen 12.  Acute DVT in the soleus and peroneal-we will change his Lovenox to apixaban/Eliquis 10 mg BID x 1 week, then 5 mg BID.  13.  Neurogenic bladder-we will continue his Foley for now and start Flomax tomorrow 0.4 mg q. supper and see if he is able to start to void;  we might need to titrate this as required- will try and remove foley next week.   4/6-we will discuss removing the Foley tomorrow but will continue for the rest of today 14. Hypokalemia: supplement 60m BID today and repeat BMP tomorrow 15. Epidural abscess-  4/5- on  Cefazolin IV for prolonged IV ABX- pt's WBC down to 15.5 (was 14k yesterday, but 20.8 3 days ago)- low grade temps up to 100.2 in last 24 hours-will con't regimen   4/6-White count is down slightly to 14K-we will continue IV antibiotics and check CRP and ESR-weekly  LOS: 4 days A FACE TO FACE EVALUATION WAS PERFORMED  Chilton Sallade 12/02/2020, 7:00 PM

## 2020-12-03 LAB — GLUCOSE, CAPILLARY
Glucose-Capillary: 196 mg/dL — ABNORMAL HIGH (ref 70–99)
Glucose-Capillary: 158 mg/dL — ABNORMAL HIGH (ref 70–99)
Glucose-Capillary: 160 mg/dL — ABNORMAL HIGH (ref 70–99)
Glucose-Capillary: 216 mg/dL — ABNORMAL HIGH (ref 70–99)

## 2020-12-03 MED ORDER — TAMSULOSIN HCL 0.4 MG PO CAPS
0.8000 mg | ORAL_CAPSULE | Freq: Every day | ORAL | Status: DC
  Administered 2020-12-03 – 2020-12-04 (×2): 0.8 mg via ORAL
  Filled 2020-12-03 (×2): qty 2

## 2020-12-03 MED ORDER — HYDROCHLOROTHIAZIDE 12.5 MG PO CAPS
12.5000 mg | ORAL_CAPSULE | Freq: Every day | ORAL | Status: DC
  Administered 2020-12-04 – 2021-01-01 (×29): 12.5 mg via ORAL
  Filled 2020-12-03 (×29): qty 1

## 2020-12-03 MED ORDER — AMLODIPINE BESYLATE 5 MG PO TABS
5.0000 mg | ORAL_TABLET | Freq: Every day | ORAL | Status: DC
  Administered 2020-12-04 – 2020-12-26 (×21): 5 mg via ORAL
  Filled 2020-12-03 (×23): qty 1

## 2020-12-03 MED ORDER — LIDOCAINE 5 % EX PTCH
2.0000 | MEDICATED_PATCH | CUTANEOUS | Status: DC
  Administered 2020-12-03 – 2020-12-06 (×4): 2 via TRANSDERMAL
  Filled 2020-12-03 (×4): qty 2

## 2020-12-03 NOTE — Progress Notes (Signed)
PROGRESS NOTE   Subjective/Complaints:  Pt reports doing about the "same" as yesterday - however admitted slept a little better. Per OT, tolerated sitting upright somewhat better- got up to 55 degrees on tilt table yesterday and they stopped due to orthostatic hypotension (not pain)- BP is 95/58 this AM laying down.   Pt willing to get foley out- and see how things go- if he's able to void.   Asked PT to use abd binder and ask nursing to get- I ordered it-  Will also decrease BP meds to help with orthostasis.   Rates pain 8-9/10instead of 10/10- so better than it was.   ROS:   Pt denies SOB, abd pain, CP, N/V/C/D, and vision changes    Objective:   No results found. Recent Labs    12/01/20 0718 12/02/20 0613  WBC 15.2* 14.4*  HGB 12.4* 12.7*  HCT 36.4* 37.9*  PLT 510* 476*   Recent Labs    12/01/20 0718  NA 133*  K 4.2  CL 103  CO2 22  GLUCOSE 150*  BUN 15  CREATININE 0.71  CALCIUM 8.3*    Intake/Output Summary (Last 24 hours) at 12/03/2020 1253 Last data filed at 12/03/2020 0720 Gross per 24 hour  Intake 168 ml  Output 950 ml  Net -782 ml        Physical Exam: Vital Signs Blood pressure 99/66, pulse 86, temperature 98.1 F (36.7 C), temperature source Oral, resp. rate 18, height _0  (1.702 m), weight 84.6 kg, SpO2 94 %.     General: awake, alert, appropriate, laying on tilt table; PT in room; NAD HENT: conjugate gaze; oropharynx moist CV: regular rate; no JVD- BP 95/58 laying down Pulmonary: CTA B/L; no W/R/R- good air movement GI: soft, NT, ND, (+)BS; hypoactive Psychiatric: appropriate; slowed processing Neurological:alert, HOH GU_ has foley in place- light amber urine Musculoskeletal:     Comments: LUE-strength in his Left arm is 4-/5-in the deltoid, bicep, tricep, wrist extension, grip, and finger abduction RUE-deltoid bicep and tricep are 3/5, wrist extension and grip are 3-/5, and  finger abduction is 1/5.  LLE-4/5 in hip flexion, knee extension, and dorsiflexion as well as plantarflexion RLE-strength is 4-/5-in the same muscles Not assessed today Skin:    General: Skin is warm and dry.     Comments: His right forearm has 2 sets of sutures that look great; the right forearm has decreased swelling and trace bruising; His bottom looks fantastic with no breakdown He has excoriation and M ASD in the groin left greater than right His heels have no breakdown or puffiness PICC line in his left upper extremity looks good  Neurological:  He reports his light touch is intact in all 4 extremities He has no Hoffmann's bilaterally He has no lower extremity clonus bilaterally  Assessment/Plan: 1. Functional deficits which require 3+ hours per day of interdisciplinary therapy in a comprehensive inpatient rehab setting.  Physiatrist is providing close team supervision and 24 hour management of active medical problems listed below.  Physiatrist and rehab team continue to assess barriers to discharge/monitor patient progress toward functional and medical goals  Care Tool:  Bathing  Bathing activity did not  occur: Safety/medical concerns (Per OT eval on medical hold) Body parts bathed by patient: Chest,Face,Left arm,Abdomen,Left upper leg,Right upper leg   Body parts bathed by helper: Front perineal area,Buttocks,Left upper leg,Right lower leg,Left lower leg,Right arm,Right upper leg     Bathing assist Assist Level: Total Assistance - Patient < 25%     Upper Body Dressing/Undressing Upper body dressing   What is the patient wearing?: Hospital gown only    Upper body assist Assist Level: Maximal Assistance - Patient 25 - 49%    Lower Body Dressing/Undressing Lower body dressing      What is the patient wearing?: Pants     Lower body assist Assist for lower body dressing: Dependent - Patient 0%     Toileting Toileting    Toileting assist Assist for toileting:  Dependent - Patient 0% (per staff  report, declined at eval. also limited by pain) Assistive Device Comment: foley   Transfers Chair/bed transfer  Transfers assist  Chair/bed transfer activity did not occur: Refused  Chair/bed transfer assist level: Dependent - Patient 0% (3 helpers via sheet)     Locomotion Ambulation   Ambulation assist   Ambulation activity did not occur: Safety/medical concerns          Walk 10 feet activity   Assist  Walk 10 feet activity did not occur: Safety/medical concerns        Walk 50 feet activity   Assist Walk 50 feet with 2 turns activity did not occur: Safety/medical concerns         Walk 150 feet activity   Assist Walk 150 feet activity did not occur: Safety/medical concerns         Walk 10 feet on uneven surface  activity   Assist Walk 10 feet on uneven surfaces activity did not occur: Safety/medical concerns         Wheelchair     Assist Will patient use wheelchair at discharge?: Yes Type of Wheelchair: Manual Wheelchair activity did not occur: Refused  Wheelchair assist level: Dependent - Patient 0%      Wheelchair 50 feet with 2 turns activity    Assist    Wheelchair 50 feet with 2 turns activity did not occur: Refused   Assist Level: Dependent - Patient 0%   Wheelchair 150 feet activity     Assist  Wheelchair 150 feet activity did not occur: Refused   Assist Level: Dependent - Patient 0%   Blood pressure 99/66, pulse 86, temperature 98.1 F (36.7 C), temperature source Oral, resp. rate 18, height _0  (1.702 m), weight 84.6 kg, SpO2 94 %.  Medical Problem List and Plan: 1.  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 Imaging?  4/6- con't PT and OT- [facility] feels no imaging is required- thinks its meningeal irritation- doing better today.  4/7- con't PT and OT- working on tilt table to get pt more upright 2.  Antithrombotics: -DVT/anticoagulation:  Pharmaceutical: Eliquis restarted due to acute DVT in peroneal/soleus             -antiplatelet therapy:  N/A 3. Pain Management: Patient is already receiving oxycodone as needed; we started tramadol scheduled but that is not working; will DC tramadol and schedule MS Contin 15 mg twice daily  4/5- will add Valium scheduled for muscle spasms?   4/6-Valium 4 mg twice a day, as well as the new as needed Fioricet appears to be helping his headaches and neck pain-continue regimen; will titrate as required  4/7- pt rating pain 8-9/10 now- and appears more comfortable- con't regimen 4. Mood: - N/A- will see if pt needs to work with Neuropsychology due to uncontrolled pain and new SCI.              -antipsychotic agents: N/A 5. Neuropsych: This patient is capable of making decisions on his own behalf. 6. Skin/Wound Care: We will change his honeycomb dressing on his neck to daily and as needed; will also change the dressing on his right forearm to just a dry dressing and change as needed. Less drainage today, will not require I&D 7. Fluids/Electrolytes/Nutrition: We will check labs on Monday 8. Uncontrolled Diabetes II- A1c of 13.9-continue patient's Levemir 10 units nightly and sliding scale insulin  4/6-blood sugars are running 170s to 207-we will increase his Levemir to 12 units nightly and monitor  4/7- BGs 158-196 since increase in Levemir- will monitor and give 1-2 days to improve 9. Hypertension hx with Orthostatic hypotension-patient is on HCTZ 25 mg daily as well as Norvasc 10 mg daily and lisinopril 40 mg daily and Lopressor 50 mg twice daily-blood pressure is well controlled we will continue these medications and monitor especially  with increased activity  4/6- BP is well controlled-continue regimen  4/7- BP 99/58 laying down- will decrease Norvac to 5 mg daily and HCTZ to 12.5 mg daily- and monitor- also add Abd binder for low BP.  10.  Neurogenic bowel-we will look to start him on a bowel program nightly after dinner with Dulcolax suppositories and digital stimulation  4/5- on nightly bowel program  4/6-having results with nightly bowel program per patient-we will continue regimen 12.  Acute DVT in the soleus and peroneal-we will change his Lovenox to apixaban/Eliquis 10 mg BID x 1 week, then 5 mg BID.  13.  Neurogenic bladder-we will continue his Foley for now and start Flomax tomorrow 0.4 mg q. supper and see if he is able to start to void;  we might need to titrate this as required- will try and remove foley next week.   4/6-we will discuss removing the Foley tomorrow but will continue for the rest of today  4/7- will d/c foley today- and increase Flomax to 0.8 mg qsupper 14. Hypokalemia: supplement 22m BID today and repeat BMP tomorrow 15. Epidural abscess-  4/5- on  Cefazolin IV for prolonged IV ABX- pt's WBC down to 15.5 (was 14k yesterday, but 20.8 3 days ago)- low grade temps up to 100.2 in last 24 hours-will con't regimen   4/6-White count is down slightly to 14K-we will continue IV antibiotics and check CRP and ESR-weekly  4/7- afebrile- will recheck labs in AM  LOS: 5 days A FACE TO FACE EVALUATION WAS PERFORMED  Shoshana Johal 12/03/2020, 12:53 PM

## 2020-12-03 NOTE — Progress Notes (Signed)
Occupational Therapy Session Note  Patient Details  Name: Daniel Cline MRN: [id number] Date of Birth: 1956/02/18  Today's Date: 12/03/2020 OT Individual Time: 1100-1154 OT Individual Time Calculation (min): 54 min    Short Term Goals: Week 1:  OT Short Term Goal 1 (Week 1): Pt will transition supine > sit Max A of 1 in prep for seated ADL OT Short Term Goal 2 (Week 1): Pt will perform seated EOB/EOM activity or ADL for 5 minutes OT Short Term Goal 3 (Week 1): Pt will improve R grip strength to 20# to improve self feeding/grooming OT Short Term Goal 4 (Week 1): Pt will perform UB dress with Mod A of 1   Skilled Therapeutic Interventions/Progress Updates:    Pt greeted at time of session semireclined in bed resting agreeable to OT session, no pain but some discomfort later with mobility, no true pain reported and rest breaks provided PRN. Donned pants and shirt for dressing tasks bed level total A for both, rolling L/R for pants over hips but able to bring up LEs to assist therapist with threading. Donned shirt with LUE first d/t decreased ROM, total A to don shirt including rolling L/R to pull down in the back. Once dressed, dependent transfer x3 assist with using sheet to reclining back wheelchair. Slowly adjusting wheelchair to tolerate up to 60*. Pt transported to/from dayroom dependent, set up at high low table and performed several dynamic seated reaching activities including blocks and clips, having severe difficulty correctly identifying colors in peripheral vision but once adjusted to face table, able to correctly identify with 100% accuracy. Hand over hand Min/Mod A for placement of clips to work on [employer]. Sheet transfer back to bed x3 assist and set up with alarm on call bell in reach.   Therapy Documentation Precautions:  Precautions Precautions: Cervical,Fall Required Braces or Orthoses: Cervical Brace Cervical Brace: Soft collar,For comfort Restrictions Weight  Bearing Restrictions: No     Therapy/Group: Individual Therapy  Erasmo Score 12/03/2020, 7:29 AM

## 2020-12-03 NOTE — Progress Notes (Signed)
Physical Therapy Session Note  Patient Details  Name: Daniel Cline MRN: [id number] Date of Birth: 29-Jul-1956  Today's Date: 12/03/2020 PT Individual Time: 1330-1435 PT Individual Time Calculation (min): 65 min   Short Term Goals: Week 1:  PT Short Term Goal 1 (Week 1): Pt will tolerate sitting in appropriate wc x 1 hr to allow for participation in therapy session PT Short Term Goal 2 (Week 1): Pt will roll L/R w/mod assist and cues PT Short Term Goal 3 (Week 1): Pt will tolerate sitting on edge of bed/mat x 10 min w/occasional repositioning if needed and mod assist  Skilled Therapeutic Interventions/Progress Updates:    pt received in bed and agreeable to therapy however denied to transfer out of bed. Pt directed in 2x20 heel slides, hip abduction, hip adduction, ankle pumps, glute squeezes, leg lifts. Pt directed in incremental elevation of HOB for improved tolerance to sitting upright, starting at 21 degrees, increased to 25, 27, 30, 34, 37, 42, 44, 47 at every segment pt remained at that degree for 3 mins then at every stage pt denied dizziness or any change in symptoms with pt reporting increased pain at 47 and unable to attempt any increase in elevation, BP taken at that time to be 107/59, HOB decreased to 30 degrees for several minutes, BP increased to 115/ 64, pt agreeable to reattempt to be increase to 34 degrees, 40 degrees and pt's BP taken again at this point to be 95/59, pt returned to 30 degrees and within in 3 mins BP increased to 105/65. Pt left in supine, All needs in reach and in good condition. Call light in hand.  And alarm set. Son present throughout. Pt denied to complete final 10 mins of PT, missed this time and educated on this.    Therapy Documentation Precautions:  Precautions Precautions: Cervical,Fall Required Braces or Orthoses: Cervical Brace Cervical Brace: Soft collar,For comfort Restrictions Weight Bearing Restrictions: No General:   Vital  Signs: Therapy Vitals Temp: 98 F (36.7 C) Temp Source: Oral Pulse Rate: 96 Resp: 18 BP: 94/73 Patient Position (if appropriate): Lying Oxygen Therapy SpO2: 94 % O2 Device: Room Air Pain:   Mobility:   Locomotion :    Trunk/Postural Assessment :    Balance:   Exercises:   Other Treatments:      Therapy/Group: Individual Therapy  Barbaraann Faster 12/03/2020, 4:06 PM

## 2020-12-03 NOTE — Progress Notes (Signed)
Physical Therapy Session Note  Patient Details  Name: Daniel Cline MRN: [id number] Date of Birth: 1956/05/31  Today's Date: 12/03/2020 PT Individual Time: 0810-0911 PT Individual Time Calculation (min): 61 min   Short Term Goals: Week 1:  PT Short Term Goal 1 (Week 1): Pt will tolerate sitting in appropriate wc x 1 hr to allow for participation in therapy session PT Short Term Goal 2 (Week 1): Pt will roll L/R w/mod assist and cues PT Short Term Goal 3 (Week 1): Pt will tolerate sitting on edge of bed/mat x 10 min w/occasional repositioning if needed and mod assist  Skilled Therapeutic Interventions/Progress Updates:    Pt received supine, asleep in bed and upon awakening agreeable to therapy session. Throughout session pt remained lethargic often closing eyes and requiring repeated stimulus for increased alertness; despite this, pt motivated to participate in session.  IV team present to disconnect PICC line for session. Pt agreeable to advance to tilt table again today. Donned B LE thigh high TED hose max assist - pt able to lift each LE to assist with this task. HOB at 35degrees upon therapist arrival.  Supine vitals (HOB 35deg): BP 95/58 (MAP 70), HR 94bpm MD in/out for morning assessment and made aware of pt's orthostatic hypotension on tilt table during yesterday's PT session and current low BP.  Rolling R/L in bed with mod assist while +2 assisted placed maxi-slide sheets under pt. Supine lateral scoot to tilt table +3 total assist.  On tilt table: Progressed directly from 0-20 degrees in 1 minute with BP: BP 95/60 (MAP 71), HR 95bpm After 4 minute advanced to 40 degrees: BP 85/61 (MAP 69), HR 96bpm with pt stating he feels "about the same as usual"  While at 40 degrees completed the following exercises:  - R UE AROM into shoulder flexion, adduction, and abduction x10 reps each direction towards external target - mini-squats with knee strap slightly loosened to allow partial  knee flexion ROM 2x5reps  Reassessed BP after 4 minutes at 40 degrees: BP 85/59 (MAP 68), HR 94bpm and again after 10 minutes: BP 81/57 (MAP 65), HR 95bpm   Returned to 0 degrees of tilt and performed B LE heel slides x10 reps each LE. R supine lateral scoot from tilt table to bed with +3 total assist using maxi-slide sheets. Pt repositioned in supine with HOB elevated to 18degrees (declines elevating further at this time despite education on importance of improving tolerance to upright). Pt left with needs in reach and bed alarm on.   Therapy Documentation Precautions:  Precautions Precautions: Cervical,Fall Required Braces or Orthoses: Cervical Brace Cervical Brace: Soft collar,For comfort Restrictions Weight Bearing Restrictions: No  Pain: Reports L shoulder pain with slight movement - repositioned for pain management.   Therapy/Group: Individual Therapy  Ginny Forth , PT, DPT, CSRS  12/03/2020, 7:46 AM

## 2020-12-04 LAB — BASIC METABOLIC PANEL
Anion gap: 9 (ref 5–15)
BUN: 22 mg/dL (ref 8–23)
CO2: 25 mmol/L (ref 22–32)
Calcium: 8.5 mg/dL — ABNORMAL LOW (ref 8.9–10.3)
Chloride: 99 mmol/L (ref 98–111)
Creatinine, Ser: 0.77 mg/dL (ref 0.61–1.24)
GFR, Estimated: >60 mL/min (ref >60)
Glucose, Bld: 168 mg/dL — ABNORMAL HIGH (ref 70–99)
Potassium: 3.5 mmol/L (ref 3.5–5.1)
Sodium: 133 mmol/L — ABNORMAL LOW (ref 135–145)

## 2020-12-04 LAB — CBC WITH DIFFERENTIAL/PLATELET
Abs Immature Granulocytes: 0.08 10*3/uL — ABNORMAL HIGH (ref 0.00–0.07)
Basophils Absolute: 0.1 10*3/uL (ref 0.0–0.1)
Basophils Relative: 1 %
Eosinophils Absolute: 0.4 10*3/uL (ref 0.0–0.5)
Eosinophils Relative: 3 %
HCT: 32.9 % — ABNORMAL LOW (ref 39.0–52.0)
Hemoglobin: 10.7 g/dL — ABNORMAL LOW (ref 13.0–17.0)
Immature Granulocytes: 1 %
Lymphocytes Relative: 19 %
Lymphs Abs: 2.9 10*3/uL (ref 0.7–4.0)
MCH: 30.9 pg (ref 26.0–34.0)
MCHC: 32.5 g/dL (ref 30.0–36.0)
MCV: 95.1 fL (ref 80.0–100.0)
Monocytes Absolute: 1.5 10*3/uL — ABNORMAL HIGH (ref 0.1–1.0)
Monocytes Relative: 10 %
Neutro Abs: 9.9 10*3/uL — ABNORMAL HIGH (ref 1.7–7.7)
Neutrophils Relative %: 66 %
Platelets: 443 10*3/uL — ABNORMAL HIGH (ref 150–400)
RBC: 3.46 MIL/uL — ABNORMAL LOW (ref 4.22–5.81)
RDW: 13.4 % (ref 11.5–15.5)
WBC: 14.9 10*3/uL — ABNORMAL HIGH (ref 4.0–10.5)
nRBC: 0 % (ref 0.0–0.2)

## 2020-12-04 LAB — GLUCOSE, CAPILLARY
Glucose-Capillary: 160 mg/dL — ABNORMAL HIGH (ref 70–99)
Glucose-Capillary: 154 mg/dL — ABNORMAL HIGH (ref 70–99)
Glucose-Capillary: 186 mg/dL — ABNORMAL HIGH (ref 70–99)
Glucose-Capillary: 239 mg/dL — ABNORMAL HIGH (ref 70–99)

## 2020-12-04 MED ORDER — MORPHINE SULFATE ER 15 MG PO TBCR
30.0000 mg | EXTENDED_RELEASE_TABLET | Freq: Two times a day (BID) | ORAL | Status: DC
Start: 2020-12-04 — End: 2020-12-05
  Administered 2020-12-04 – 2020-12-05 (×3): 30 mg via ORAL
  Filled 2020-12-04 (×3): qty 2

## 2020-12-04 MED ORDER — INSULIN DETEMIR 100 UNIT/ML ~~LOC~~ SOLN
14.0000 [IU] | Freq: Every day | SUBCUTANEOUS | Status: DC
  Administered 2020-12-04 – 2020-12-06 (×3): 14 [IU] via SUBCUTANEOUS
  Filled 2020-12-04 (×4): qty 0.14

## 2020-12-04 MED ORDER — LISINOPRIL 20 MG PO TABS
20.0000 mg | ORAL_TABLET | Freq: Every day | ORAL | Status: DC
  Administered 2020-12-05 – 2020-12-08 (×4): 20 mg via ORAL
  Filled 2020-12-04 (×5): qty 1

## 2020-12-04 NOTE — Progress Notes (Signed)
Physical Therapy Session Note  Patient Details  Name: Daniel Cline MRN: [id number] Date of Birth: 08-26-1956  Today's Date: 12/04/2020 PT Individual Time: 1358-1430 PT Individual Time Calculation (min): 32 min   Short Term Goals: Week 1:  PT Short Term Goal 1 (Week 1): Pt will tolerate sitting in appropriate wc x 1 hr to allow for participation in therapy session PT Short Term Goal 2 (Week 1): Pt will roll L/R w/mod assist and cues PT Short Term Goal 3 (Week 1): Pt will tolerate sitting on edge of bed/mat x 10 min w/occasional repositioning if needed and mod assist    Skilled Therapeutic Interventions/Progress Updates:    pt received in bed and initially denying therapy but agreeable once educated on importance of mobility and participation for improved function and level of I. Pt BP taken as pt noted lethargy with speaking to therapist, eyes closed with majority of conversation. BP taken to be 96/59 supine in bed. Nursing updated and came to assess pt, after 5 mins BP taken to be 113/59. Nursing cleared pt for continued therapy and reported to call if there were needs. Pt educated and agreeable to attempt "half sitting on the bed" pt reported he was fearful of pain, PT educated pt on returning to supine if needed. Pt directed in supine>sit dependent x2 with pt trunk reclined excessively onto PT posteriorly with pt ultimately in more reclined position than was semi-reclined in bed. Pt yelled out x2 with initial movement then with single step VC for breathing technique and calming pt this improved. Pt unable to attempt or tolerate any mobility at all from this position and reported, "I can't do anymore. Please lay me down." pt dependently returned to supine x2 assist and max A x1 for rolling to R and L for positioning and dependent x2 upward scooting for improved positioning. Pt then refused to participate in remainder of therapy. Pt left in bed, All needs in reach and in good condition. Call  light in hand.  And alarm set. Pt missed 28 mins of therapy.   Therapy Documentation Precautions:  Precautions Precautions: Cervical,Fall Required Braces or Orthoses: Cervical Brace Cervical Brace: Soft collar,For comfort Restrictions Weight Bearing Restrictions: No General: PT Amount of Missed Time (min): 28 Minutes PT Missed Treatment Reason: Pain;Patient unwilling to participate Vital Signs: Therapy Vitals Temp: 98.3 F (36.8 C) Pulse Rate: 87 Resp: 16 BP: (!) 96/59 Patient Position (if appropriate): Lying Oxygen Therapy SpO2: 95 % O2 Device: Room Air Pain:   Mobility:   Locomotion :    Trunk/Postural Assessment :    Balance:   Exercises:   Other Treatments:      Therapy/Group: Individual Therapy  Barbaraann Faster 12/04/2020, 2:33 PM

## 2020-12-04 NOTE — Progress Notes (Signed)
Occupational Therapy Session Note   Patient Details  Name: Daniel Cline MRN: [id number] Date of Birth: [birth year]  Today's Date: 12/04/2020 OT Individual Time: 0800-0900 OT Individual Time Calculation (min): 60 min    Short Term Goals: Week 1:  OT Short Term Goal 1 (Week 1): Pt will transition supine > sit Max A of 1 in prep for seated ADL OT Short Term Goal 2 (Week 1): Pt will perform seated EOB/EOM activity or ADL for 5 minutes OT Short Term Goal 3 (Week 1): Pt will improve R grip strength to 20# to improve self feeding/grooming OT Short Term Goal 4 (Week 1): Pt will perform UB dress with Mod A of 1  Skilled Therapeutic Interventions/Progress Updates:    1:1. Pt received in bed agreeable to OT reporting HA and neck apin. RN delivers medication during session. Pt completes rolling in B directions after total A to don pants at bed level with MAX A to roll of 1 person. Pt able to lift LEs to don LB clothing over feet. Pt able to use BUE and handled cup to drink d/t BUE weakness RUE>LUE. Cued RUE support cup at bottom to lift to mouth fading to S overall for taking sips. Pt completes UB therex with beach ball 2x10 chest squeezes, elbow flex/ext, int/ext rotation through pain free ROM and supine chest press through 65% ROM. HOHA facilitation of RUE. Exited session with pt seated in bed, exit alarm on and call light in reach  Therapy Documentation Precautions:  Precautions Precautions: Cervical,Fall Required Braces or Orthoses: Cervical Brace Cervical Brace: Soft collar,For comfort Restrictions Weight Bearing Restrictions: No General:   Vital Signs: Therapy Vitals Temp: 98.4 F (36.9 C) Temp Source: Oral Pulse Rate: 93 Resp: 18 BP: 118/77 Patient Position (if appropriate): Lying Oxygen Therapy SpO2: 96 % O2 Device: Room Air Pain: Pain Assessment Pain Scale: 0-10 Pain Score: 0-No pain ADL: ADL Eating: Not assessed Grooming: Not assessed Upper Body Bathing: Maximal  assistance Where Assessed-Upper Body Bathing: Bed level Lower Body Bathing: Dependent Where Assessed-Lower Body Bathing: Bed level Upper Body Dressing: Dependent Lower Body Dressing: Dependent Where Assessed-Lower Body Dressing: Bed level Toileting: Not assessed Toilet Transfer: Not assessed Tub/Shower Transfer: Not assessed Walk-In Shower Transfer: Not assessed Vision   Perception    Praxis   Exercises:   Other Treatments:     Therapy/Group: Individual Therapy  Shon Hale 12/04/2020, 6:52 AM

## 2020-12-04 NOTE — Progress Notes (Signed)
Occupational Therapy Session Note  Patient Details  Name: Daniel Cline MRN: [id number] Date of Birth: [birth year]  Today's Date: 12/04/2020 OT Individual Time: 8756-4332 OT Individual Time Calculation (min): 53 min   Short Term Goals: Week 1:  OT Short Term Goal 1 (Week 1): Pt will transition supine > sit Max A of 1 in prep for seated ADL OT Short Term Goal 2 (Week 1): Pt will perform seated EOB/EOM activity or ADL for 5 minutes OT Short Term Goal 3 (Week 1): Pt will improve R grip strength to 20# to improve self feeding/grooming OT Short Term Goal 4 (Week 1): Pt will perform UB dress with Mod A of 1  Skilled Therapeutic Interventions/Progress Updates:    Pt greeted in bed, appearing lethargic. He stated that he hardly slept last night and requested for bedlevel therapy due to lack of sleep and fatigue from previous therapy. Tried to guide pt through B UE arm circles however pt unable to complete with the Lt side due to shoulder pain. Noted hypersensitivity to touch in the Lt shoulder. Active assist ROM Lt forearm supination too painful, pt able to tolerate a little elbow mobility, wrist, and digit mobility (with elbow flexed) without much pain. Pt states, mobility wise for his Lt arm: "that's about it." Discussed with RN getting a heating pad to address. Next guided pt through Rt sided shoulder stretches including shoulder rolls and shrugs, very difficult for pt to comprehend exercise technique, asking instructions to be repeated and pt closing his eyes frequently. He needed frequent manual cues. Resistive pushes for UB strength completed with the Rt UE x5 reps, pt visibly exhausted after 3 reps and asking for a break. Anterior reaches to target while crossing midline with the Rt UE to work on activity tolerance/Rt UE strength. Pt needing prolonged rest after ~20-60 second bouts of activity, unable to activate core and elevate shoulders off of the mattress due to weakness. Noted decreased Rt UE  coordination when reaching as well. We discussed help at home, pt stating that his son and daughter in law will assist him post d/c. Pt visibly fatigued just by conversing with therapist, also seeming to fall asleep in the middle of conversation. At end of tx pt left in care of nursing for cath.   Therapy Documentation Precautions:  Precautions Precautions: Cervical,Fall Required Braces or Orthoses: Cervical Brace Cervical Brace: Soft collar,For comfort Restrictions Weight Bearing Restrictions: No ADL: ADL Eating: Not assessed Grooming: Not assessed Upper Body Bathing: Maximal assistance Where Assessed-Upper Body Bathing: Bed level Lower Body Bathing: Dependent Where Assessed-Lower Body Bathing: Bed level Upper Body Dressing: Dependent Lower Body Dressing: Dependent Where Assessed-Lower Body Dressing: Bed level Toileting: Not assessed Toilet Transfer: Not assessed Tub/Shower Transfer: Not assessed Walk-In Shower Transfer: Not assessed      Therapy/Group: Individual Therapy  Carlyon Nolasco A Jackelin Correia 12/04/2020, 12:46 PM

## 2020-12-04 NOTE — Progress Notes (Signed)
PROGRESS NOTE   Subjective/Complaints:  Per nursing, pt had elevated MEWS last night- had soft BP, so they held all morning time BP meds, including his B blocker and therefore HR got up to 120s- is better now around 109.   Pt ate 100% of tray.  Said peed a small amount, but has needed in/out caths due to not voiding enough.  However don't see bladder scans in computer, so don't know if emptied?  Pt reports pain still 8-9/10 at rest- better than initially, but hasn't gotten any better in last 2 days- this fits his previous narrative.  Will increase MS contin, with goal to get pt to participate more with therapy.    ROS:   Pt denies SOB, abd pain, CP, N/V/C/D, and vision changes    Objective:   No results found. Recent Labs    12/02/20 0613 12/04/20 0400  WBC 14.4* 14.9*  HGB 12.7* 10.7*  HCT 37.9* 32.9*  PLT 476* 443*   Recent Labs    12/04/20 0400  NA 133*  K 3.5  CL 99  CO2 25  GLUCOSE 168*  BUN 22  CREATININE 0.77  CALCIUM 8.5*    Intake/Output Summary (Last 24 hours) at 12/04/2020 0827 Last data filed at 12/03/2020 1725 Gross per 24 hour  Intake 246 ml  Output 525 ml  Net -279 ml        Physical Exam: Vital Signs Blood pressure 117/63, pulse 99, temperature 98.4 F (36.9 C), temperature source Oral, resp. rate 18, height _0  (1.702 m), weight 84.6 kg, SpO2 96 %.      General: awake, alert, appropriate, laying supine in bed- just finished 100% breakfast, NAD HENT: conjugate gaze; oropharynx moist CV: tachycardic; regular rhythm; no JVD Pulmonary: CTA B/L; no W/R/R- good air movement GI: soft, NT, ND, (+)BS- hypoactive Psychiatric: appropriate- HOH Neurological: alert; few spasms sene in LEs  Musculoskeletal:     Comments: LUE-strength in his Left arm is 4-/5-in the deltoid, bicep, tricep, wrist extension, grip, and finger abduction RUE-deltoid bicep and tricep are 3/5, wrist extension  and grip are 3-/5, and finger abduction is 1/5.  LLE-4/5 in hip flexion, knee extension, and dorsiflexion as well as plantarflexion RLE-strength is 4-/5-in the same muscles Not assessed today Skin:    General: Skin is warm and dry.     Comments: His right forearm has 2 sets of sutures that look great; the right forearm has decreased swelling and trace bruising; His bottom looks fantastic with no breakdown He has excoriation and M ASD in the groin left greater than right His heels have no breakdown or puffiness PICC line in his left upper extremity looks good  Neurological:  He reports his light touch is intact in all 4 extremities He has no Hoffmann's bilaterally He has no lower extremity clonus bilaterally  Assessment/Plan: 1. Functional deficits which require 3+ hours per day of interdisciplinary therapy in a comprehensive inpatient rehab setting.  Physiatrist is providing close team supervision and 24 hour management of active medical problems listed below.  Physiatrist and rehab team continue to assess barriers to discharge/monitor patient progress toward functional and medical goals  Care Tool:  Bathing  Bathing activity did not occur: Safety/medical concerns (Per OT eval on medical hold) Body parts bathed by patient: Chest,Face,Left arm,Abdomen,Left upper leg,Right upper leg   Body parts bathed by helper: Front perineal area,Buttocks,Left upper leg,Right lower leg,Left lower leg,Right arm,Right upper leg     Bathing assist Assist Level: Total Assistance - Patient < 25%     Upper Body Dressing/Undressing Upper body dressing   What is the patient wearing?: Hospital gown only    Upper body assist Assist Level: Maximal Assistance - Patient 25 - 49%    Lower Body Dressing/Undressing Lower body dressing      What is the patient wearing?: Pants     Lower body assist Assist for lower body dressing: Dependent - Patient 0%     Toileting Toileting    Toileting assist  Assist for toileting: Dependent - Patient 0% (per staff  report, declined at eval. also limited by pain) Assistive Device Comment: foley   Transfers Chair/bed transfer  Transfers assist  Chair/bed transfer activity did not occur: Refused  Chair/bed transfer assist level: Dependent - Patient 0% (3 helpers via sheet)     Locomotion Ambulation   Ambulation assist   Ambulation activity did not occur: Safety/medical concerns          Walk 10 feet activity   Assist  Walk 10 feet activity did not occur: Safety/medical concerns        Walk 50 feet activity   Assist Walk 50 feet with 2 turns activity did not occur: Safety/medical concerns         Walk 150 feet activity   Assist Walk 150 feet activity did not occur: Safety/medical concerns         Walk 10 feet on uneven surface  activity   Assist Walk 10 feet on uneven surfaces activity did not occur: Safety/medical concerns         Wheelchair     Assist Will patient use wheelchair at discharge?: Yes Type of Wheelchair: Manual Wheelchair activity did not occur: Refused  Wheelchair assist level: Dependent - Patient 0%      Wheelchair 50 feet with 2 turns activity    Assist    Wheelchair 50 feet with 2 turns activity did not occur: Refused   Assist Level: Dependent - Patient 0%   Wheelchair 150 feet activity     Assist  Wheelchair 150 feet activity did not occur: Refused   Assist Level: Dependent - Patient 0%   Blood pressure 117/63, pulse 99, temperature 98.4 F (36.9 C), temperature source Oral, resp. rate 18, height _0  (1.702 m), weight 84.6 kg, SpO2 96 %.  Medical Problem List and Plan: 1.  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 Imaging?  4/6- con't PT and OT- [facility] feels no imaging is required- thinks its meningeal irritation- doing better today.  4/8- Con't PT and OT- with goal to get upright soon 2.  Antithrombotics: -DVT/anticoagulation:  Pharmaceutical: Eliquis restarted due to acute DVT in peroneal/soleus             -antiplatelet  therapy: N/A 3. Pain Management: Patient is already receiving oxycodone as needed; we started tramadol scheduled but that is not working; will DC tramadol and schedule MS Contin 15 mg twice daily  4/5- will add Valium scheduled for muscle spasms?   4/6-Valium 4 mg twice a day, as well as the new as needed Fioricet appears to be helping his headaches and neck pain-continue regimen; will titrate as required  4/8- will increase MS Contin to 30 mg BID from 15 mg BID (next dose up) and see if that helps pain, so can work with therapy more. If feels needs it, can increase Short acting slightly over the weekend 4. Mood: - N/A- will see if pt needs to work with Neuropsychology due to uncontrolled pain and new SCI.              -antipsychotic agents: N/A 5. Neuropsych: This patient is capable of making decisions on his own behalf. 6. Skin/Wound Care: We will change his honeycomb dressing on his neck to daily and as needed; will also change the dressing on his right forearm to just a dry dressing and change as needed. Less drainage today, will not require I&D  4/8- no drainage seen- con't daily dry dressing and prn 7. Fluids/Electrolytes/Nutrition: We will check labs on Monday 8. Uncontrolled Diabetes II- A1c of 13.9-continue patient's Levemir 10 units nightly and sliding scale insulin  4/6-blood sugars are running 170s to 207-we will increase his Levemir to 12 units nightly and monitor  4/8- BGs 150-216- but all were 160 and under except x1- will increase Levemir to 14 units- will help wound healing 9. Hypertension hx with Orthostatic  hypotension-patient is on HCTZ 25 mg daily as well as Norvasc 10 mg daily and lisinopril 40 mg daily and Lopressor 50 mg twice daily-blood pressure is well controlled we will continue these medications and monitor especially with increased activity  4/6- BP is well controlled-continue regimen  4/7- BP 99/58 laying down- will decrease Norvac to 5 mg daily and HCTZ to 12.5 mg daily- and monitor- also add Abd binder for low BP.   4/8- asked them to hold BP but not B blocker if BP meds held- will decrease Lisinopril to 20 mg daily.  10.  Neurogenic bowel-we will look to start him on a bowel program nightly after dinner with Dulcolax suppositories and digital stimulation  4/5- on nightly bowel program  4/6-having results with nightly bowel program per patient-we will continue regimen 12.  Acute DVT in the soleus and peroneal-we will change his Lovenox to apixaban/Eliquis 10 mg BID x 1 week, then 5 mg BID.  13.  Neurogenic bladder-we will continue his Foley for now and start Flomax tomorrow 0.4 mg q. supper and see if he is able to start to void;  we might need to titrate this as required- will try and remove foley next week.   4/6-we will discuss removing the Foley tomorrow but will continue for the rest of today  4/7- will d/c foley today- and increase Flomax to 0.8 mg qsupper  4/8- no PVRs/bladder scans documented- will make sure he's scanned. Flomax at max dose. COn't regimen  14. Hypokalemia: supplement 70m BID today and repeat BMP tomorrow  4/8- K+ 3.5- borderline low- will recheck Monday  15. Epidural abscess-  4/5- on  Cefazolin IV for prolonged IV ABX- pt's WBC down to 15.5 (was 14k yesterday, but 20.8 3 days ago)- low grade temps up to 100.2 in last 24 hours-will con't regimen  4/6-White count is down slightly to 14K-we will continue IV antibiotics and check CRP and ESR-weekly  4/7- afebrile- will recheck labs in AM  4/8- WBC stable- con't IV ABX.   LOS: 6 days A FACE TO FACE EVALUATION  WAS PERFORMED  Chales Pelissier 12/04/2020, 8:27 AM

## 2020-12-05 LAB — GLUCOSE, CAPILLARY
Glucose-Capillary: 162 mg/dL — ABNORMAL HIGH (ref 70–99)
Glucose-Capillary: 157 mg/dL — ABNORMAL HIGH (ref 70–99)
Glucose-Capillary: 170 mg/dL — ABNORMAL HIGH (ref 70–99)
Glucose-Capillary: 195 mg/dL — ABNORMAL HIGH (ref 70–99)

## 2020-12-05 MED ORDER — TAMSULOSIN HCL 0.4 MG PO CAPS
0.4000 mg | ORAL_CAPSULE | Freq: Every day | ORAL | Status: DC
  Administered 2020-12-05 – 2020-12-08 (×4): 0.4 mg via ORAL
  Filled 2020-12-05 (×5): qty 1

## 2020-12-05 MED ORDER — MORPHINE SULFATE ER 15 MG PO TBCR
15.0000 mg | EXTENDED_RELEASE_TABLET | Freq: Two times a day (BID) | ORAL | Status: DC
  Administered 2020-12-05 – 2020-12-14 (×17): 15 mg via ORAL
  Filled 2020-12-05 (×18): qty 1

## 2020-12-05 NOTE — Progress Notes (Signed)
PROGRESS NOTE   Subjective/Complaints:   Patient lethargic this morning he talks for a few words and then fades out.  He states that he is "sore all over", cannot tell me if he had therapy today  ROS:   Pt denies SOB, abd pain, CP, N/V/C/D, and vision changes    Objective:   No results found. Recent Labs    12/04/20 0400  WBC 14.9*  HGB 10.7*  HCT 32.9*  PLT 443*   Recent Labs    12/04/20 0400  NA 133*  K 3.5  CL 99  CO2 25  GLUCOSE 168*  BUN 22  CREATININE 0.77  CALCIUM 8.5*    Intake/Output Summary (Last 24 hours) at 12/05/2020 1116 Last data filed at 12/05/2020 0800 Gross per 24 hour  Intake 340 ml  Output 1225 ml  Net -885 ml        Physical Exam: Vital Signs Blood pressure 114/77, pulse 89, temperature 98.2 F (36.8 C), temperature source Oral, resp. rate 18, height '5\' 7"'  (1.702 m), weight 84.6 kg, SpO2 99 %.  General: No acute distress Mood and affect are appropriate Heart: Regular rate and rhythm no rubs murmurs or extra sounds Lungs: Clear to auscultation, breathing unlabored, no rales or wheezes Abdomen: Positive bowel sounds, soft nontender to palpation, nondistended Extremities: No clubbing, cyanosis, or edema Skin: No evidence of breakdown, no evidence of rash   Musculoskeletal:     Comments: LUE-strength in his Left arm is 4-/5-in the deltoid, bicep, tricep, wrist extension, grip, and finger abduction RUE-deltoid bicep and tricep are 3/5, wrist extension and grip are 3-/5, LLE-4/5 in hip flexion, knee extension, and dorsiflexion as well as plantarflexion RLE-strength is 4-/5-in the same muscles Not assessed today Skin:    General: Skin is warm and dry.     Comments:  PICC line in his left upper extremity looks good  Neurological:  He reports his light touch is intact in all 4 extremities  He has no lower extremity clonus bilaterally  Assessment/Plan: 1. Functional deficits  which require 3+ hours per day of interdisciplinary therapy in a comprehensive inpatient rehab setting.  Physiatrist is providing close team supervision and 24 hour management of active medical problems listed below.  Physiatrist and rehab team continue to assess barriers to discharge/monitor patient progress toward functional and medical goals  Care Tool:  Bathing  Bathing activity did not occur: Safety/medical concerns (Per OT eval on medical hold) Body parts bathed by patient: Chest,Face,Left arm,Abdomen,Left upper leg,Right upper leg   Body parts bathed by helper: Front perineal area,Buttocks,Left upper leg,Right lower leg,Left lower leg,Right arm,Right upper leg     Bathing assist Assist Level: Total Assistance - Patient < 25%     Upper Body Dressing/Undressing Upper body dressing   What is the patient wearing?: Hospital gown only    Upper body assist Assist Level: Maximal Assistance - Patient 25 - 49%    Lower Body Dressing/Undressing Lower body dressing      What is the patient wearing?: Pants     Lower body assist Assist for lower body dressing: Dependent - Patient 0%     Toileting Toileting    Toileting assist  Assist for toileting: Dependent - Patient 0% (per staff  report, declined at eval. also limited by pain) Assistive Device Comment: foley   Transfers Chair/bed transfer  Transfers assist  Chair/bed transfer activity did not occur: Refused  Chair/bed transfer assist level: Dependent - Patient 0% (3 helpers via sheet)     Locomotion Ambulation   Ambulation assist   Ambulation activity did not occur: Safety/medical concerns          Walk 10 feet activity   Assist  Walk 10 feet activity did not occur: Safety/medical concerns        Walk 50 feet activity   Assist Walk 50 feet with 2 turns activity did not occur: Safety/medical concerns         Walk 150 feet activity   Assist Walk 150 feet activity did not occur: Safety/medical  concerns         Walk 10 feet on uneven surface  activity   Assist Walk 10 feet on uneven surfaces activity did not occur: Safety/medical concerns         Wheelchair     Assist Will patient use wheelchair at discharge?: Yes Type of Wheelchair: Manual Wheelchair activity did not occur: Refused  Wheelchair assist level: Dependent - Patient 0%      Wheelchair 50 feet with 2 turns activity    Assist    Wheelchair 50 feet with 2 turns activity did not occur: Refused   Assist Level: Dependent - Patient 0%   Wheelchair 150 feet activity     Assist  Wheelchair 150 feet activity did not occur: Refused   Assist Level: Dependent - Patient 0%   Blood pressure 114/77, pulse 89, temperature 98.2 F (36.8 C), temperature source Oral, resp. rate 18, height '5\' 7"'  (1.702 m), weight 84.6 kg, SpO2 99 %.  Medical Problem List and Plan: 1.  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 Imaging?  4/6- con't PT and OT- [facility] feels no imaging is required- thinks its meningeal irritation- doing better today.  4/8- Con't PT and OT- with goal to get upright soon 2.  Antithrombotics: -DVT/anticoagulation:  Pharmaceutical: Eliquis restarted due to acute DVT in peroneal/soleus             -antiplatelet therapy: N/A 3. Pain Management: Patient is already receiving oxycodone as needed; we started tramadol scheduled but that is not working; will DC tramadol and schedule MS Contin 15 mg twice daily  4/5- will add Valium scheduled for muscle spasms?   4/6-Valium 4 mg twice a day, as well as the new as needed Fioricet appears to be helping his headaches and neck pain-continue regimen; will titrate  as required  4/8- will increase MS Contin to 30 mg BID from 15 mg BID (next dose up) and see if that helps pain, so can work with therapy more. If feels needs it, can increase Short acting slightly over the weekend Patient is now lethargic will reduce MS Contin and monitor 4. Mood: - N/A- will see if pt needs to work with Neuropsychology due to uncontrolled pain  and new SCI.              -antipsychotic agents: N/A 5. Neuropsych: This patient is capable of making decisions on his own behalf. 6. Skin/Wound Care: We will change his honeycomb dressing on his neck to daily and as needed; will also change the dressing on his right forearm to just a dry dressing and change as needed. Less drainage today, will not require I&D  4/8- no drainage seen- con't daily dry dressing and prn 7. Fluids/Electrolytes/Nutrition: We will check labs on Monday 8. Uncontrolled Diabetes II- A1c of 13.9-continue patient's Levemir 10 units nightly and sliding scale insulin  4/6-blood sugars are running 170s to 207-we will increase his Levemir to 12 units nightly and monitor  4/8- BGs 150-216- but all were 160 and under except x1- will increase Levemir to 14 units- will help wound healing 9. Hypertension hx with Orthostatic hypotension-patient is on HCTZ 25 mg daily as well as Norvasc 10 mg daily and lisinopril 40 mg daily and Lopressor 50 mg twice daily-blood pressure is well controlled we will continue these medications and monitor especially with increased activity  4/6- BP is well controlled-continue regimen  4/7- BP 99/58 laying down- will decrease Norvac to 5 mg daily and HCTZ to 12.5 mg daily- and monitor- also add Abd binder for low BP.   4/8- asked them to hold BP but not B blocker if BP meds held- will decrease Lisinopril to 20 mg daily.  Vitals:   12/04/20 2022 12/05/20 0515  BP: (!) 108/56 114/77  Pulse: 97 89  Resp: 20 18  Temp: 98.5 F (36.9 C) 98.2 F (36.8 C)  SpO2: 96% 99%  Controlled 4/9  10.   Neurogenic bowel-we will look to start him on a bowel program nightly after dinner with Dulcolax suppositories and digital stimulation  4/5- on nightly bowel program  4/6-having results with nightly bowel program per patient-we will continue regimen 12.  Acute DVT in the soleus and peroneal-we will change his Lovenox to apixaban/Eliquis 10 mg BID x 1 week, then 5 mg BID.  13.  Neurogenic bladder-we will continue his Foley for now and start Flomax tomorrow 0.4 mg q. supper and see if he is able to start to void;  we might need to titrate this as required- will try and remove foley next week.   4/6-we will discuss removing the Foley tomorrow but will continue for the rest of today  4/7- will d/c foley today- and increase Flomax to 0.8 mg qsupper  4/8- no PVRs/bladder scans documented- will make sure he's scanned. Flomax at max dose. COn't regimen  14. Hypokalemia: supplement 48m BID today and repeat BMP tomorrow  4/8- K+ 3.5- borderline low- will recheck Monday  15. Epidural abscess-  4/5- on  Cefazolin IV for prolonged IV ABX- pt's WBC down to 15.5 (was 14k yesterday, but 20.8 3 days ago)- low grade temps up to 100.2 in last 24 hours-will con't regimen   4/6-White count is down slightly to 14K-we will continue IV antibiotics and check CRP and ESR-weekly  4/7- afebrile- will recheck labs in AM  4/8- WBC stable- con't IV ABX.   LOS: 7 days A FACE TO FACE EVALUATION WAS PERFORMED  ACharlett Blake4/04/2021, 11:16 AM

## 2020-12-05 NOTE — Plan of Care (Signed)
  Problem: Consults Goal: RH SPINAL CORD INJURY PATIENT EDUCATION Description:  See Patient Education module for education specifics.  Outcome: Progressing Goal: Skin Care Protocol Initiated - if Braden Score 18 or less Description: If consults are not indicated, leave blank or document N/A Outcome: Progressing Goal: Nutrition Consult-if indicated Outcome: Progressing Goal: Diabetes Guidelines if Diabetic/Glucose > 140 Description: If diabetic or lab glucose is > 140 mg/dl - Initiate Diabetes/Hyperglycemia Guidelines & Document Interventions  Outcome: Progressing   Problem: SCI BOWEL ELIMINATION Goal: RH STG MANAGE BOWEL WITH ASSISTANCE Description: STG Manage Bowel with supervision Assistance. Outcome: Progressing   Problem: SCI BLADDER ELIMINATION Goal: RH STG MANAGE BLADDER WITH ASSISTANCE Description: STG Manage Bladder With supervision Assistance Outcome: Progressing   Problem: RH SKIN INTEGRITY Goal: RH STG SKIN FREE OF INFECTION/BREAKDOWN Description: Skin will remain free from infection/breakdown while on rehab. Outcome: Progressing Goal: RH STG ABLE TO PERFORM INCISION/WOUND CARE W/ASSISTANCE Description: STG Able To Perform Incision/Wound Care With supervision Assistance. Outcome: Progressing   Problem: RH SAFETY Goal: RH STG ADHERE TO SAFETY PRECAUTIONS W/ASSISTANCE/DEVICE Description: STG Adhere to Safety Precautions With supervision Assistance/Device. Outcome: Progressing Goal: RH STG DECREASED RISK OF FALL WITH ASSISTANCE Description: STG Decreased Risk of Fall With supervision Assistance. Outcome: Progressing   Problem: RH PAIN MANAGEMENT Goal: RH STG PAIN MANAGED AT OR BELOW PT'S PAIN GOAL Description: <4 on a 0-10 pain scale. Outcome: Progressing   Problem: RH KNOWLEDGE DEFICIT SCI Goal: RH STG INCREASE KNOWLEDGE OF SELF CARE AFTER SCI Description: Patient will be able to demonstrate knowledge of medication management, diabetes management, skin/wound  care, weight bearing precautions with educational materials and handouts provided by staff. Outcome: Progressing   

## 2020-12-05 NOTE — Progress Notes (Addendum)
Physical Therapy Session Note  Patient Details  Name: Daniel Cline MRN: [id number] Date of Birth: [birth year]  Today's Date: 12/05/2020 PT Individual Time: 1415-1440 PT Individual Time Calculation (min): 25 min   Short Term Goals: Week 1:  PT Short Term Goal 1 (Week 1): Pt will tolerate sitting in appropriate wc x 1 hr to allow for participation in therapy session PT Short Term Goal 2 (Week 1): Pt will roll L/R w/mod assist and cues PT Short Term Goal 3 (Week 1): Pt will tolerate sitting on edge of bed/mat x 10 min w/occasional repositioning if needed and mod assist  Skilled Therapeutic Interventions/Progress Updates:    Pt greeted at start of session sleeping with HOB at 15deg elevation - required loud voice and tactile feedback to awaken. Even then, required ++ time and verbal engagement to improve alertness. Pt is lethargic and drowsy, kept eyes closed throughout session - when eyes were open, noted both pupils to be constricted. Pt deferring OOB mobility despite encouragement and motivation, requesting to rest. With distraction, began placing hospital bed into chair position - able to raise HOB up to 54 degrees before he began c/o intolerable neck pain. Able to lower HOB to 45deg in chair position with pt in agreement to maintain. RN made aware of pt's response to mobility and status - notified of concerns regarding drowsiness and lethargy. Pt missed 20 minutes of skilled therapy.  Therapy Documentation Precautions:  Precautions Precautions: Cervical,Fall Required Braces or Orthoses: Cervical Brace Cervical Brace: Soft collar,For comfort Restrictions Weight Bearing Restrictions: No General: PT Amount of Missed Time (min): 20 Minutes PT Missed Treatment Reason: Pain;Patient unwilling to participate  Therapy/Group: Individual Therapy  Alano Blasco P Encarnacion Scioneaux PT 12/05/2020, 7:33 AM

## 2020-12-05 NOTE — Progress Notes (Signed)
   Subjective: Patient is a 65 year old male approximately 2 weeks  status post right volar forearm abscess irrigation and debridement. Culture revealed MSSA    Patient reports pain as mild to moderate.  Reports pain with wrist motion. Denies numbness or tingling. Denies fever or chills.        Date of Surgery: 11/20/2020   INDICATIONS: Daniel Cline is a 65 y.o.-year-old male with a right volar forearm abscess.  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  This was managed with a posterior laminectomy decompression.  He was noted while in the ICU to have increasing pain and swelling of the right forearm.  CT scan did show a intramuscular abscess.  He was indicated for open irrigation and debridement.;  The patient and His son did consent to the procedure after discussion of the risks and benefits.   PREOPERATIVE DIAGNOSIS:  1.  Right forearm intramuscular abscess   POSTOPERATIVE DIAGNOSIS: Same.   PROCEDURE:  Incision and drainage of intramuscular abscess right forearm   SURGEON: Maryan Rued, M.D.   Objective:   VITALS:   Vitals:   12/04/20 0818 12/04/20 1320 12/04/20 2022 12/05/20 0515  BP: 117/63 (!) 96/59 (!) 108/56 114/77  Pulse: 99 87 97 89  Resp:  16 20 18   Temp:  98.3 F (36.8 C) 98.5 F (36.9 C) 98.2 F (36.8 C)  TempSrc:   Oral Oral  SpO2:  95% 96% 99%  Weight:      Height:      Right upper extremity :   Neurologically intact Neurovascular intact Sensation intact distally Intact pulses distally Incision: two well-healed proximal and distal dorsal forearm incisions without signs of infection. No cellulitis present Compartment soft Able to composite fist.  Full wrist and elbow range of motion.  Mild tenderness of forearm.  No drainage or induration  Lab Results  Component Value Date   WBC 14.9 (H) 12/04/2020   HGB 10.7 (L) 12/04/2020   HCT 32.9 (L) 12/04/2020   MCV 95.1 12/04/2020   PLT 443 (H) 12/04/2020   BMET     Component Value Date/Time   NA 133 (L) 12/04/2020 0400   K 3.5 12/04/2020 0400   CL 99 12/04/2020 0400   CO2 25 12/04/2020 0400   GLUCOSE 168 (H) 12/04/2020 0400   BUN 22 12/04/2020 0400   CREATININE 0.77 12/04/2020 0400   CALCIUM 8.5 (L) 12/04/2020 0400   GFRNONAA >60 12/04/2020 0400   GFRAA >60 08/20/2018 0731     Assessment/Plan:   Principal Problem:   Cervical myelopathy (HCC) Active Problems:   Epidural intraspinal abscess   Hypertension associated with diabetes (HCC)   Uncontrolled type 2 diabetes mellitus with hyperglycemia (HCC)   Neurogenic bowel   Neurogenic bladder   Up with therapy Sutures removed today, simple dry dressing with gauze and Ace wrap placed.  Recommend simple dry dressing for few more days and then can discontinue dressing changes.  Okay to get wet, no soaking for at least 1 more week incisions are well-healed without drainage or other signs of infection. Weightbearing Status:WBAT, ROM as tolerated Ortho will sign off.    08/22/2018 Daniel Cline 12/05/2020, 8:16 AM  02/04/2021 PA-C  Physician Assistant with Dr. Dion Saucier [place]

## 2020-12-05 NOTE — Progress Notes (Signed)
Bowel program started. Suppository inserted small amount of soft mushy stool noted. Will report off to night shift to complete.

## 2020-12-05 NOTE — Progress Notes (Signed)
Patient drowsy but easily aroused, and oriented , states he tired, no acute distress/discomfort, Continue bowel program and dig stimulation,assisted with repositioning to left side.  2130 Continue several time with dig stimulation for bowel program, good results of large brown form stool, incontinence care provided, Encourage po liquids and moitored

## 2020-12-06 LAB — GLUCOSE, CAPILLARY
Glucose-Capillary: 148 mg/dL — ABNORMAL HIGH (ref 70–99)
Glucose-Capillary: 156 mg/dL — ABNORMAL HIGH (ref 70–99)
Glucose-Capillary: 167 mg/dL — ABNORMAL HIGH (ref 70–99)
Glucose-Capillary: 256 mg/dL — ABNORMAL HIGH (ref 70–99)

## 2020-12-06 NOTE — Plan of Care (Signed)
  Problem: Consults Goal: RH SPINAL CORD INJURY PATIENT EDUCATION Description:  See Patient Education module for education specifics.  Outcome: Progressing Goal: Skin Care Protocol Initiated - if Braden Score 18 or less Description: If consults are not indicated, leave blank or document N/A Outcome: Progressing Goal: Nutrition Consult-if indicated Outcome: Progressing Goal: Diabetes Guidelines if Diabetic/Glucose > 140 Description: If diabetic or lab glucose is > 140 mg/dl - Initiate Diabetes/Hyperglycemia Guidelines & Document Interventions  Outcome: Progressing   Problem: SCI BOWEL ELIMINATION Goal: RH STG MANAGE BOWEL WITH ASSISTANCE Description: STG Manage Bowel with supervision Assistance. Outcome: Progressing   Problem: RH SKIN INTEGRITY Goal: RH STG SKIN FREE OF INFECTION/BREAKDOWN Description: Skin will remain free from infection/breakdown while on rehab. Outcome: Progressing Goal: RH STG ABLE TO PERFORM INCISION/WOUND CARE W/ASSISTANCE Description: STG Able To Perform Incision/Wound Care With supervision Assistance. Outcome: Progressing   Problem: RH SAFETY Goal: RH STG ADHERE TO SAFETY PRECAUTIONS W/ASSISTANCE/DEVICE Description: STG Adhere to Safety Precautions With supervision Assistance/Device. Outcome: Progressing Goal: RH STG DECREASED RISK OF FALL WITH ASSISTANCE Description: STG Decreased Risk of Fall With supervision Assistance. Outcome: Progressing   Problem: RH PAIN MANAGEMENT Goal: RH STG PAIN MANAGED AT OR BELOW PT'S PAIN GOAL Description: <4 on a 0-10 pain scale. Outcome: Progressing   Problem: RH KNOWLEDGE DEFICIT SCI Goal: RH STG INCREASE KNOWLEDGE OF SELF CARE AFTER SCI Description: Patient will be able to demonstrate knowledge of medication management, diabetes management, skin/wound care, weight bearing precautions with educational materials and handouts provided by staff. Outcome: Progressing   Problem: SCI BLADDER ELIMINATION Goal: RH STG  MANAGE BLADDER WITH ASSISTANCE Description: STG Manage Bladder With supervision Assistance Outcome: Not Progressing

## 2020-12-06 NOTE — Progress Notes (Signed)
Bowel program started at 1850. Rectal vault clear. Suppository inserted. Will report to on coming shift to complete.

## 2020-12-06 NOTE — Progress Notes (Signed)
PROGRESS NOTE   Subjective/Complaints:  Hard of hearing , tired this am but awakens to voice , no pain c/os   ROS:   Pt denies SOB, abd pain, CP, N/V/C/D, and vision changes    Objective:   No results found. Recent Labs    12/04/20 0400  WBC 14.9*  HGB 10.7*  HCT 32.9*  PLT 443*   Recent Labs    12/04/20 0400  NA 133*  K 3.5  CL 99  CO2 25  GLUCOSE 168*  BUN 22  CREATININE 0.77  CALCIUM 8.5*    Intake/Output Summary (Last 24 hours) at 12/06/2020 0857 Last data filed at 12/06/2020 0700 Gross per 24 hour  Intake 220 ml  Output 1450 ml  Net -1230 ml        Physical Exam: Vital Signs Blood pressure 116/69, pulse 89, temperature 97.9 F (36.6 C), resp. rate 18, height '5\' 7"'  (1.702 m), weight 84.6 kg, SpO2 97 %.  General: No acute distress Mood and affect are appropriate Heart: Regular rate and rhythm no rubs murmurs or extra sounds Lungs: Clear to auscultation, breathing unlabored, no rales or wheezes Abdomen: Positive bowel sounds, soft nontender to palpation, nondistended Extremities: No clubbing, cyanosis, or edema Skin: No evidence of breakdown, no evidence of rash   Musculoskeletal:     Comments: LUE-strength in his Left arm is 4-/5-in the deltoid, bicep, tricep, wrist extension, grip, and finger abduction RUE-deltoid bicep and tricep are 3/5, wrist extension and grip are 3-/5, LLE-4/5 in hip flexion, knee extension, and dorsiflexion as well as plantarflexion RLE-strength is 4-/5-in the same muscles Not assessed today Skin:    General: Skin is warm and dry.     Comments:  PICC line in his left upper extremity looks good  Neurological:  He reports his light touch is intact in all 4 extremities  He has no lower extremity clonus bilaterally  Assessment/Plan: 1. Functional deficits which require 3+ hours per day of interdisciplinary therapy in a comprehensive inpatient rehab  setting.  Physiatrist is providing close team supervision and 24 hour management of active medical problems listed below.  Physiatrist and rehab team continue to assess barriers to discharge/monitor patient progress toward functional and medical goals  Care Tool:  Bathing  Bathing activity did not occur: Safety/medical concerns (Per OT eval on medical hold) Body parts bathed by patient: Chest,Face,Left arm,Abdomen,Left upper leg,Right upper leg   Body parts bathed by helper: Front perineal area,Buttocks,Left upper leg,Right lower leg,Left lower leg,Right arm,Right upper leg     Bathing assist Assist Level: Total Assistance - Patient < 25%     Upper Body Dressing/Undressing Upper body dressing   What is the patient wearing?: Hospital gown only    Upper body assist Assist Level: Maximal Assistance - Patient 25 - 49%    Lower Body Dressing/Undressing Lower body dressing      What is the patient wearing?: Pants     Lower body assist Assist for lower body dressing: Dependent - Patient 0%     Toileting Toileting    Toileting assist Assist for toileting: Dependent - Patient 0% (per staff  report, declined at eval. also limited by pain) Assistive  Device Comment: foley   Transfers Chair/bed transfer  Transfers assist  Chair/bed transfer activity did not occur: Refused  Chair/bed transfer assist level: Dependent - Patient 0% (3 helpers via sheet)     Locomotion Ambulation   Ambulation assist   Ambulation activity did not occur: Safety/medical concerns          Walk 10 feet activity   Assist  Walk 10 feet activity did not occur: Safety/medical concerns        Walk 50 feet activity   Assist Walk 50 feet with 2 turns activity did not occur: Safety/medical concerns         Walk 150 feet activity   Assist Walk 150 feet activity did not occur: Safety/medical concerns         Walk 10 feet on uneven surface  activity   Assist Walk 10 feet on  uneven surfaces activity did not occur: Safety/medical concerns         Wheelchair     Assist Will patient use wheelchair at discharge?: Yes Type of Wheelchair: Manual Wheelchair activity did not occur: Refused  Wheelchair assist level: Dependent - Patient 0%      Wheelchair 50 feet with 2 turns activity    Assist    Wheelchair 50 feet with 2 turns activity did not occur: Refused   Assist Level: Dependent - Patient 0%   Wheelchair 150 feet activity     Assist  Wheelchair 150 feet activity did not occur: Refused   Assist Level: Dependent - Patient 0%   Blood pressure 116/69, pulse 89, temperature 97.9 F (36.6 C), resp. rate 18, height '5\' 7"'  (1.702 m), weight 84.6 kg, SpO2 97 %.  Medical Problem List and Plan: 1.  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 Imaging?  4/6- con't PT and OT- [facility] feels no imaging is required- thinks its meningeal irritation- doing better today.  4/8- Con't PT and OT- with goal to get upright soon 2.  Antithrombotics: -DVT/anticoagulation:  Pharmaceutical: Eliquis restarted due to acute DVT in peroneal/soleus             -antiplatelet therapy: N/A 3. Pain Management: Patient is already receiving oxycodone as needed; we started tramadol scheduled but that is not working; will DC tramadol and schedule MS Contin 15 mg twice daily  4/5- will add Valium scheduled for muscle spasms?   4/6-Valium 4 mg twice a day, as well as the new as needed Fioricet appears to be helping his headaches and neck pain-continue regimen; will titrate as required  4/8- will increase MS Contin to 30 mg BID from 15 mg BID Patient is now lethargic will reduce MS  Continto 38m BID, no pain c/os 4. Mood: - N/A- will see if pt needs to work with Neuropsychology due to uncontrolled pain and new SCI.              -antipsychotic agents: N/A 5. Neuropsych: This patient is capable of making decisions on his own behalf. 6. Skin/Wound Care: We will change his honeycomb dressing on his neck to daily  and as needed; will also change the dressing on his right forearm to just a dry dressing and change as needed. Less drainage today, will not require I&D  4/8- no drainage seen- con't daily dry dressing and prn 7. Fluids/Electrolytes/Nutrition: We will check labs on Monday 8. Uncontrolled Diabetes II- A1c of 13.9-continue patient's Levemir 10 units nightly and sliding scale insulin  4/6-blood sugars are running 170s to 207-we will increase his Levemir to 12 units nightly and monitor  4/8- BGs 150-216- but all were 160 and under except x1- will increase Levemir to 14 units- will help wound healing 9. Hypertension hx with Orthostatic hypotension-patient is on HCTZ 25 mg daily as well as Norvasc 10 mg daily and lisinopril 40 mg daily and Lopressor 50 mg twice daily-blood pressure is well controlled we will continue these medications and monitor especially with increased activity  4/6- BP is well controlled-continue regimen  4/7- BP 99/58 laying down- will decrease Norvac to 5 mg daily and HCTZ to 12.5 mg daily- and monitor- also add Abd binder for low BP.   4/8- asked them to hold BP but not B blocker if BP meds held- will decrease Lisinopril to 20 mg daily.  Vitals:   12/05/20 1925 12/06/20 0334  BP: 105/64 116/69  Pulse: (!) 103 89  Resp: 18 18  Temp: 98.9 F (37.2 C) 97.9 F (36.6 C)  SpO2: 98% 97%  Controlled 4/10  10.  Neurogenic bowel-we will look to start him on a bowel program nightly after dinner with Dulcolax suppositories and digital stimulation  4/5- on nightly bowel program  4/6-having results with nightly bowel program per patient-we will continue  regimen 12.  Acute DVT in the soleus and peroneal-we will change his Lovenox to apixaban/Eliquis 10 mg BID x 1 week, then 5 mg BID.  13.  Neurogenic bladder-we will continue his Foley for now and start Flomax tomorrow 0.4 mg q. supper and see if he is able to start to void;  we might need to titrate this as required- will try and remove foley next week.   4/6-we will discuss removing the Foley tomorrow but will continue for the rest of today  4/7- will d/c foley today- and increase Flomax to 0.8 mg qsupper  4/8- no PVRs/bladder scans documented- will make sure he's scanned. Flomax at max dose. COn't regimen  14. Hypokalemia: supplement 9m BID today and repeat BMP tomorrow  4/8- K+ 3.5- borderline low- will recheck Monday  15. Epidural abscess-  4/5- on  Cefazolin IV for prolonged IV ABX- pt's WBC down to 15.5 (was 14k yesterday, but 20.8 3 days ago)- low grade temps up to 100.2 in last 24 hours-will con't regimen   4/6-White count is down slightly to 14K-we will continue IV antibiotics and check CRP and ESR-weekly  4/7- afebrile- will recheck labs in AM  4/8- WBC stable- con't IV ABX.   LOS: 8 days A FACE TO FACE EVALUATION WAS PERFORMED  ACharlett Blake4/05/2021, 8:57 AM

## 2020-12-07 LAB — COMPREHENSIVE METABOLIC PANEL
ALT: 19 U/L (ref 0–44)
AST: 29 U/L (ref 15–41)
Albumin: 1.8 g/dL — ABNORMAL LOW (ref 3.5–5.0)
Alkaline Phosphatase: 176 U/L — ABNORMAL HIGH (ref 38–126)
Anion gap: 7 (ref 5–15)
BUN: 26 mg/dL — ABNORMAL HIGH (ref 8–23)
CO2: 24 mmol/L (ref 22–32)
Calcium: 8.6 mg/dL — ABNORMAL LOW (ref 8.9–10.3)
Chloride: 100 mmol/L (ref 98–111)
Creatinine, Ser: 0.94 mg/dL (ref 0.61–1.24)
GFR, Estimated: >60 mL/min (ref >60)
Glucose, Bld: 157 mg/dL — ABNORMAL HIGH (ref 70–99)
Potassium: 3.3 mmol/L — ABNORMAL LOW (ref 3.5–5.1)
Sodium: 131 mmol/L — ABNORMAL LOW (ref 135–145)
Total Bilirubin: 0.6 mg/dL (ref 0.3–1.2)
Total Protein: 6.7 g/dL (ref 6.5–8.1)

## 2020-12-07 LAB — CBC WITH DIFFERENTIAL/PLATELET
Abs Immature Granulocytes: 0.04 10*3/uL (ref 0.00–0.07)
Basophils Absolute: 0.1 10*3/uL (ref 0.0–0.1)
Basophils Relative: 1 %
Eosinophils Absolute: 0.4 10*3/uL (ref 0.0–0.5)
Eosinophils Relative: 4 %
HCT: 31.8 % — ABNORMAL LOW (ref 39.0–52.0)
Hemoglobin: 10.5 g/dL — ABNORMAL LOW (ref 13.0–17.0)
Immature Granulocytes: 0 %
Lymphocytes Relative: 22 %
Lymphs Abs: 2.4 10*3/uL (ref 0.7–4.0)
MCH: 31 pg (ref 26.0–34.0)
MCHC: 33 g/dL (ref 30.0–36.0)
MCV: 93.8 fL (ref 80.0–100.0)
Monocytes Absolute: 1.1 10*3/uL — ABNORMAL HIGH (ref 0.1–1.0)
Monocytes Relative: 10 %
Neutro Abs: 7.2 10*3/uL (ref 1.7–7.7)
Neutrophils Relative %: 63 %
Platelets: 441 10*3/uL — ABNORMAL HIGH (ref 150–400)
RBC: 3.39 MIL/uL — ABNORMAL LOW (ref 4.22–5.81)
RDW: 13.2 % (ref 11.5–15.5)
WBC: 11.3 10*3/uL — ABNORMAL HIGH (ref 4.0–10.5)
nRBC: 0 % (ref 0.0–0.2)

## 2020-12-07 LAB — GLUCOSE, CAPILLARY
Glucose-Capillary: 151 mg/dL — ABNORMAL HIGH (ref 70–99)
Glucose-Capillary: 126 mg/dL — ABNORMAL HIGH (ref 70–99)
Glucose-Capillary: 132 mg/dL — ABNORMAL HIGH (ref 70–99)
Glucose-Capillary: 138 mg/dL — ABNORMAL HIGH (ref 70–99)

## 2020-12-07 LAB — SEDIMENTATION RATE: Sed Rate: >140 mm/hr — ABNORMAL HIGH (ref 0–16)

## 2020-12-07 LAB — C-REACTIVE PROTEIN: CRP: 10.9 mg/dL — ABNORMAL HIGH (ref <1.0)

## 2020-12-07 MED ORDER — INSULIN DETEMIR 100 UNIT/ML ~~LOC~~ SOLN
16.0000 [IU] | Freq: Every day | SUBCUTANEOUS | Status: DC
  Administered 2020-12-07 – 2020-12-18 (×12): 16 [IU] via SUBCUTANEOUS
  Filled 2020-12-07 (×13): qty 0.16

## 2020-12-07 MED ORDER — LIDOCAINE 5 % EX PTCH
3.0000 | MEDICATED_PATCH | CUTANEOUS | Status: DC
  Administered 2020-12-07 – 2020-12-31 (×19): 3 via TRANSDERMAL
  Filled 2020-12-07 (×23): qty 3

## 2020-12-07 MED ORDER — CHLORHEXIDINE GLUCONATE CLOTH 2 % EX PADS
6.0000 | MEDICATED_PAD | Freq: Two times a day (BID) | CUTANEOUS | Status: DC
  Administered 2020-12-07 – 2021-01-01 (×48): 6 via TOPICAL

## 2020-12-07 MED ORDER — POTASSIUM CHLORIDE CRYS ER 20 MEQ PO TBCR
40.0000 meq | EXTENDED_RELEASE_TABLET | Freq: Two times a day (BID) | ORAL | Status: AC
  Administered 2020-12-07 (×2): 40 meq via ORAL
  Filled 2020-12-07 (×2): qty 2

## 2020-12-07 NOTE — Progress Notes (Addendum)
PROGRESS NOTE   Subjective/Complaints:  Pt reports will need pain meds soon- he feels pain is still 8-9/10- MS Contin reduced back to 15 mg BID due to sedation- and pain back to where it was.   Also c/o L shoulder x 1 week (first he's mentioned it)- aching and throbbing.  Also RUE/hand stays cold- explained was due to nerve Sx's.  Bowel program went OK, per pt. Had small BM last night. Large the night prior.   ROS:   Pt denies SOB, abd pain, CP, N/V/C/D, and vision changes   Objective:   No results found. Recent Labs    12/07/20 0400  WBC 11.3*  HGB 10.5*  HCT 31.8*  PLT 441*   Recent Labs    12/07/20 0400  NA 131*  K 3.3*  CL 100  CO2 24  GLUCOSE 157*  BUN 26*  CREATININE 0.94  CALCIUM 8.6*    Intake/Output Summary (Last 24 hours) at 12/07/2020 0919 Last data filed at 12/07/2020 0900 Gross per 24 hour  Intake 600 ml  Output 1400 ml  Net -800 ml        Physical Exam: Vital Signs Blood pressure 108/68, pulse 87, temperature 98.2 F (36.8 C), temperature source Oral, resp. rate 18, height _0  (1.702 m), weight 84.6 kg, SpO2 96 %.    General: awake, alert, appropriate, more awake this AM- reports needs pain meds soon; NAD HENT: conjugate gaze; oropharynx moist, missing teeth CV: regular rate; no JVD Pulmonary: CTA B/L; no W/R/R- good air movement GI: soft, NT, ND, (+)BS; normoactive- just finished breakfast Psychiatric: appropriate; flat affect Neurological: alert  Musculoskeletal:     Comments: LUE-strength in his Left arm is 4-/5-in the deltoid, bicep, tricep, wrist extension, grip, and finger abduction RUE-deltoid bicep and tricep are 3/5, wrist extension and grip are 3-/5, LLE-4/5 in hip flexion, knee extension, and dorsiflexion as well as plantarflexion RLE-strength is 4-/5-in the same muscles TTP over L shoulder and L upper trap- muscles feel pretty loose.  Not assessed today Skin:     General: Skin is warm and dry.     Comments:  PICC line in his left upper extremity looks good  Neurological:  He reports his light touch is intact in all 4 extremities  He has no lower extremity clonus bilaterally  Assessment/Plan: 1. Functional deficits which require 3+ hours per day of interdisciplinary therapy in a comprehensive inpatient rehab setting.  Physiatrist is providing close team supervision and 24 hour management of active medical problems listed below.  Physiatrist and rehab team continue to assess barriers to discharge/monitor patient progress toward functional and medical goals  Care Tool:  Bathing  Bathing activity did not occur: Safety/medical concerns (Per OT eval on medical hold) Body parts bathed by patient: Chest,Face,Left arm,Abdomen,Left upper leg,Right upper leg   Body parts bathed by helper: Front perineal area,Buttocks,Left upper leg,Right lower leg,Left lower leg,Right arm,Right upper leg     Bathing assist Assist Level: Total Assistance - Patient < 25%     Upper Body Dressing/Undressing Upper body dressing   What is the patient wearing?: Hospital gown only    Upper body assist Assist Level: Maximal Assistance -  Patient 25 - 49%    Lower Body Dressing/Undressing Lower body dressing      What is the patient wearing?: Pants     Lower body assist Assist for lower body dressing: Dependent - Patient 0%     Toileting Toileting    Toileting assist Assist for toileting: Dependent - Patient 0% (per staff  report, declined at eval. also limited by pain) Assistive Device Comment: foley   Transfers Chair/bed transfer  Transfers assist  Chair/bed transfer activity did not occur: Refused  Chair/bed transfer assist level: Dependent - Patient 0% (3 helpers via sheet)     Locomotion Ambulation   Ambulation assist   Ambulation activity did not occur: Safety/medical concerns          Walk 10 feet activity   Assist  Walk 10 feet  activity did not occur: Safety/medical concerns        Walk 50 feet activity   Assist Walk 50 feet with 2 turns activity did not occur: Safety/medical concerns         Walk 150 feet activity   Assist Walk 150 feet activity did not occur: Safety/medical concerns         Walk 10 feet on uneven surface  activity   Assist Walk 10 feet on uneven surfaces activity did not occur: Safety/medical concerns         Wheelchair     Assist Will patient use wheelchair at discharge?: Yes Type of Wheelchair: Manual Wheelchair activity did not occur: Refused  Wheelchair assist level: Dependent - Patient 0%      Wheelchair 50 feet with 2 turns activity    Assist    Wheelchair 50 feet with 2 turns activity did not occur: Refused   Assist Level: Dependent - Patient 0%   Wheelchair 150 feet activity     Assist  Wheelchair 150 feet activity did not occur: Refused   Assist Level: Dependent - Patient 0%   Blood pressure 108/68, pulse 87, temperature 98.2 F (36.8 C), temperature source Oral, resp. rate 18, height _0  (1.702 m), weight 84.6 kg, SpO2 96 %.  Medical Problem List and Plan: 1.  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 Imaging?  4/6- con't PT and OT- [facility] feels no imaging is required- thinks its meningeal irritation- doing better today.  4/8- Con't PT and OT- with goal to get upright soon  4/11- con't PT and OT 2.  Antithrombotics: -DVT/anticoagulation:  Pharmaceutical: Eliquis restarted due to acute DVT in peroneal/soleus             -antiplatelet therapy: N/A 3. Pain Management: Patient is already receiving oxycodone as needed; we started tramadol  scheduled but that is not working; will DC tramadol and schedule MS Contin 15 mg twice daily  4/5- will add Valium scheduled for muscle spasms?   4/6-Valium 4 mg twice a day, as well as the new as needed Fioricet appears to be helping his headaches and neck pain-continue regimen; will titrate as required  4/8- will increase MS Contin to 30 mg BID from  15 mg BID Patient is now lethargic will reduce MS Continto 38m BID, no pain c/os  4/11- pt still rating pain 8-9/10- but stable- will increase lidodemr patches to have 1 also on L shoulder 4. Mood: - N/A- will see if pt needs to work with Neuropsychology due to uncontrolled pain and new SCI.              -antipsychotic agents: N/A 5. Neuropsych: This patient is capable of making decisions on his own behalf. 6. Skin/Wound Care: We will change his honeycomb dressing on his neck to daily and as needed; will also change the dressing on his right forearm to just a dry dressing and change as needed. Less drainage today, will not require I&D  4/8- no drainage seen- con't daily dry dressing and prn 7. Fluids/Electrolytes/Nutrition: We will check labs on Monday 8. Uncontrolled Diabetes II- A1c of 13.9-continue patient's Levemir 10 units nightly and sliding scale insulin  4/6-blood sugars are running 170s to 207-we will increase his Levemir to 12 units nightly and monitor  4/8- BGs 150-216- but all were 160 and under except x1- will increase Levemir to 14 units- will help wound healing  4/11- BGs 150s-250s- only 1 over 175- will increase Levemir to 16 units 9. Hypertension hx with Orthostatic hypotension-patient is on HCTZ 25 mg daily as well as Norvasc 10 mg daily and lisinopril 40 mg daily and Lopressor 50 mg twice daily-blood pressure is well controlled we will continue these medications and monitor especially with increased activity  4/6- BP is well controlled-continue regimen  4/7- BP 99/58 laying down- will decrease Norvac to 5 mg daily and HCTZ to 12.5  mg daily- and monitor- also add Abd binder for low BP.   4/8- asked them to hold BP but not B blocker if BP meds held- will decrease Lisinopril to 20 mg daily.   4/11- Pulse better when not holding B blocker- BP soft, con't regimen for now Vitals:   12/06/20 1931 12/07/20 0336  BP: (!) 118/57 108/68  Pulse: 93 87  Resp: 18 18  Temp: 98.4 F (36.9 C) 98.2 F (36.8 C)  SpO2: 97% 96%    10.  Neurogenic bowel-we will look to start him on a bowel program nightly after dinner with Dulcolax suppositories and digital stimulation  4/5- on nightly bowel program  4/6-having results with nightly bowel program per patient-we will continue regimen  4/11- having results nightly- con't regimen 12.  Acute DVT in the soleus and peroneal-we will change his Lovenox to apixaban/Eliquis 10 mg BID x 1 week, then 5 mg BID.  13.  Neurogenic bladder-we will continue his Foley for now and start Flomax tomorrow 0.4 mg q. supper and see if he is able to start to void;  we might need to titrate this as required- will try and remove foley next week.   4/6-we will discuss removing the Foley tomorrow but will continue for the rest of today  4/7- will d/c foley today- and increase Flomax to 0.8 mg qsupper  4/8- no PVRs/bladder scans documented- will make sure he's scanned. Flomax at max dose. COn't regimen  14. Hypokalemia: supplement 447mBID today and repeat BMP tomorrow  4/8- K+ 3.5- borderline low- will recheck Monday  4/11- K+ 3.3 will replete 40 mEq x2 and recheck Thursday  15. Epidural abscess-  4/5- on  Cefazolin IV for prolonged IV ABX- pt's WBC down to 15.5 (was 14k yesterday, but 20.8 3 days ago)- low grade temps up  to 100.2 in last 24 hours-will con't regimen   4/6-White count is down slightly to 14K-we will continue IV antibiotics and check CRP and ESR-weekly  4/7- afebrile- will recheck labs in AM  4/8- WBC stable- con't IV ABX.   4/11- WBC down to 11.3- ESR >140 and CRP 10.9- heading a little better-  con't to monitor.   LOS: 9 days A FACE TO FACE EVALUATION WAS PERFORMED  Robertha Staples 12/07/2020, 9:19 AM

## 2020-12-07 NOTE — Progress Notes (Signed)
Bowel program started, dig stim performed, suppository inserted, pt placed on bedpan. Report given to noc nurse.  Mylo Red, LPN

## 2020-12-07 NOTE — Progress Notes (Signed)
PA Dan informed of pt neck incision drainage. No new orders received. Continue POC.  Mylo Red, LPN

## 2020-12-07 NOTE — Progress Notes (Signed)
Bowel program continue x3 dig stimulation, small amount of brownish stool

## 2020-12-07 NOTE — Progress Notes (Signed)
Physical Therapy Weekly Progress Note  Patient Details  Name: Daniel Cline MRN: [id number] Date of Birth: 03-14-1956  Beginning of progress report period: November 30, 2020 End of progress report period: December 07, 2020  Today's Date: 12/07/2020 PT Individual Time: 1100-1200 and 1445-1326 PT Individual Time Calculation (min): 60 min and 41 mins  Patient has met 2 of 3 short term goals.  Pt continues to be profoundly limited 2/2 pain in posterior neck and shoulders with headache with inability to tolerate sitting upright and refusals to attempt sitting upright other than in reclined WC. Pt also refuses continuously to attempt transfers other than dependent x3 supine slide transfer to/from bed/WC 2/2 pain. Pt continues to require dependent x3 for transfer bed<>chair, mod A for rolling with bed rails, poor tolerance to sitting in recliner with pt only able to tolerate WC use with reclined position, not upright ~45-50 degrees.    Patient continues to demonstrate the following deficits muscle weakness, decreased cardiorespiratoy endurance and decreased sitting balance, decreased standing balance, decreased postural control and decreased balance strategies and therefore will continue to benefit from skilled PT intervention to increase functional independence with mobility.  Patient progressing toward long term goals..  Continue plan of care.  PT Short Term Goals Week 1:  PT Short Term Goal 1 (Week 1): Pt will tolerate sitting in appropriate wc x 1 hr to allow for participation in therapy session PT Short Term Goal 1 - Progress (Week 1): Met PT Short Term Goal 2 (Week 1): Pt will roll L/R w/mod assist and cues PT Short Term Goal 2 - Progress (Week 1): Met PT Short Term Goal 3 (Week 1): Pt will tolerate sitting on edge of bed/mat x 10 min w/occasional repositioning if needed and mod assist PT Short Term Goal 3 - Progress (Week 1): Not met Week 2:  PT Short Term Goal 1 (Week 2): pt to demonstrate  upright sitting position at least ~75 degrees to increase safety with transfers and sitting tolerance PT Short Term Goal 2 (Week 2): pt to demonstrate supine>sit mod A x1 PT Short Term Goal 3 (Week 2): Pt will tolerate sitting on edge of bed/mat x 10 min w/occasional repositioning if needed and mod assist    Skilled Therapeutic Interventions/Progress Updates:    session 1: pt received in bed and agreeable to therapy however refused to attempt sitting EOB, transfers of any kind, and only agreeable to supine sheet transfer to WC. Pt reported 9/10 pain in posterior neck. Pt directed in rolling to L with mod A to complete for donning pants total A to complete. At this time, PT noted excessive drainage at pt's posterior neck with saturated dressings, shirt, sheets and pillow case with drainage. Nursing called to bedside to assess, nursing retreive additional needs for wound care and called PA. Per nursing, there are no further needs from PA other than nursing to dress wound. Nursing reporting pt cleared to get up to [facility] with therapy. Pt directed in x4 rolls R and L directions with use of bedrails mod A to roll limited 2/2 pain and required min A to remain in side lying for nursing to complete wound care. Pt also required total A for changing shirt as shirt pt was wearing was saturated with drainage. Pt then directed in supine lateral scoot transfer with sheet dependent x3 to complete to transfer to [facility]. Pt then directed in increased upright position of reclined WC back to improve upright position. Pt agreeable to remaining upright,  left sitting in WC reclined unable to tolerate sitting fully upright however improved from bed. Pt left with nursing, All needs in reach and in good condition. Call light in hand.  Alarm belt set. PT returned one hour after session to assist in transfer back to bed with dependent x3 supine lateral sheet transfer to return to bed.   Session 2: pt received in bed and agreeable to therapy  only in bed. Pt directed in increased HOB elevation from 16 degrees to 20, 25, 30, 32, 36, 42. Pt unable to tolerate 42 degrees post 5 mins, all other heights held at 3 mins. Pt reported " I cant do this anymore". Pt lowered slightly to 39 degrees and reported improved comfort. However pt reported continuous 8/10 pain throughout. Pt directed in BLE strengthening exercises heel slides, hip abduction, hip adduction, ankle pumps, glute squeezes 2x10. Pt left in bed, All needs in reach and in good condition. Call light in hand.  And alarm set.   Therapy Documentation Precautions:  Precautions Precautions: Cervical,Fall Required Braces or Orthoses: Cervical Brace Cervical Brace: Soft collar,For comfort Restrictions Weight Bearing Restrictions: No General:   Vital Signs:   Pain: Pain Assessment Pain Scale: 0-10 Pain Score: 9  Pain Location: Shoulder Pain Orientation: Right;Left Pain Intervention(s): Medication (See eMAR) Vision/Perception     Mobility:   Locomotion :    Trunk/Postural Assessment :    Balance:   Exercises:   Other Treatments:     Therapy/Group: Individual Therapy  Daniel Cline 12/07/2020, 12:32 PM

## 2020-12-08 LAB — GLUCOSE, CAPILLARY
Glucose-Capillary: 119 mg/dL — ABNORMAL HIGH (ref 70–99)
Glucose-Capillary: 141 mg/dL — ABNORMAL HIGH (ref 70–99)
Glucose-Capillary: 131 mg/dL — ABNORMAL HIGH (ref 70–99)
Glucose-Capillary: 155 mg/dL — ABNORMAL HIGH (ref 70–99)

## 2020-12-08 NOTE — Patient Care Conference (Signed)
Inpatient RehabilitationTeam Conference and Plan of Care Update Date: 12/08/2020   Time: 11:47 AM    Patient Name: Daniel Cline      Medical Record Number: [id number]  Date of Birth: 1956/05/29 Sex: Male         Room/Bed: 4W03C/4W03C-01 Payor Info: Payor: MEDICARE / Plan: MEDICARE PART A / Product Type: *No Product type* /    Admit Date/Time:  11/28/2020  1:22 PM  Primary Diagnosis:  Cervical myelopathy [facility])  Hospital Problems: Principal Problem:   Cervical myelopathy (HCC) Active Problems:   Epidural intraspinal abscess   Hypertension associated with diabetes (HCC)   Uncontrolled type 2 diabetes mellitus with hyperglycemia (HCC)   Neurogenic bowel   Neurogenic bladder    Expected Discharge Date: Expected Discharge Date:  (3-4 Weeks)  Team Members Present: Physician leading conference: Dr. Genice Rouge Care Coodinator Present: Kennyth Arnold, RN, BSN, CRRN;Becky Dupree, LCSW Nurse Present: Kennyth Arnold, RN PT Present: Karolee Stamps, PT OT Present: Roney Mans, OT PPS Coordinator present : Fae Pippin, SLP     Current Status/Progress Goal Weekly Team Focus  Bowel/Bladder   Patient has urinary retention I/O cath Q6 hrs for no voids and bladder scan >= 350, Bowel program daily effective LBM 12/07/20  remain continent  Continue time toileting q 2 hrs and PRN   Swallow/Nutrition/ Hydration             ADL's   Continues to be max/total A for BADL tasks, using bed pan as he can't tolerate sitting EOB or OOB 2/2 pain  Min overall for LB ADLs and transfers - may be lofty  pain management, any functional activity, sittong EOB, supine<>sit, pt education,self-care retraining   Mobility   refused all upright transfers; dependent x3 supine lateral transfers with sheet; mod A x1 rolling  min A transfers; supervision bed mob; min A gait 50-25'; supervision 150' WC  pain control; bed mob; transfers; gait; WC   Communication             Safety/Cognition/ Behavioral  Observations            Pain   Patient c/ surgical neck and shoulder pain pain score 8/10, remain on schedule  MS Contin and Tylenol.and prn Oxy.  Pain <=3/10  QS/PRN assessment with f/u   Skin   Surgical neck incision with moderate /large amount of drainage Dressing changes q shift  pt will have healing of MASD no further breakdown of infection  QS/PRN assess, document changes     Discharge Planning:  Pt not able to participate in therapies due to pain issues, plan to go home with son and daughter in-law aware will require physical care at DC   Team Discussion: Complained of nausea. Increased lidocaine patches, CBG's doing better, BP running soft, decreased BP medications. Don't hold the Beta Blocker. Document bowel program, patient has neurogenic bowel/bladder. Patient receiving I&O caths, pain reported at 9/10, but controlled with medication. No sleep issues, incision to back of neck is clean. Medication given for nausea. Need a family conference. Patient on target to meet rehab goals: no, dependently used a sheet to transfer him. He is very self-limiting, haven't actively done a transfer with him at all. Question to make him 15/7 due to lack of progress. Definitely going to have to have a family conference.  *See Care Plan and progress notes for long and short-term goals.   Revisions to Treatment Plan:  Not at this time.  Teaching Needs: Family education, medication  management, pain management, skin/wound care, bowel/bladder education and management, transfer training, gait training, balance training, endurance training, safety awareness, bed mobility training, pressure relief training, sit to stand training.  Current Barriers to Discharge: Decreased caregiver support, Medical stability, Home enviroment access/layout, Incontinence, Neurogenic bowel and bladder, Wound care, Lack of/limited family support, Weight bearing restrictions, Medication compliance and Behavior  Possible  Resolutions to Barriers: Continue current medications, provide emotional support.     Medical Summary Current Status: epidural abscess- neurogenic bowel/bladder- might need foley? currently- in/out caths; changed to 15/7; pain 8-9/10 per pt;  Barriers to Discharge: Wound care;Weight bearing restrictions;Home enviroment access/layout;Decreased family/caregiver support;Medical stability;IV antibiotics;Medication compliance;Neurogenic Bowel & Bladder;Incontinence  Barriers to Discharge Comments: incomplete quadriplegia; a lot of encouragement required-self limiting- needs to go home on IV ABX; Possible Resolutions to [employer] Focus: used sheet to get up in chair- doing tilt table barely- no transfers even tried; pt limiting what he does- needs family conference- changed to 15/7 today;   Continued Need for Acute Rehabilitation Level of Care: The patient requires daily medical management by a physician with specialized training in physical medicine and rehabilitation for the following reasons: Direction of a multidisciplinary physical rehabilitation program to maximize functional independence : Yes Medical management of patient stability for increased activity during participation in an intensive rehabilitation regime.: Yes Analysis of laboratory values and/or radiology reports with any subsequent need for medication adjustment and/or medical intervention. : Yes   I attest that I was present, lead the team conference, and concur with the assessment and plan of the team.   Tennis Must 12/08/2020, 7:07 PM

## 2020-12-08 NOTE — Progress Notes (Signed)
Physical Therapy Session Note  Patient Details  Name: Daniel Cline MRN: [id number] Date of Birth: 02-12-1956  Today's Date: 12/08/2020 PT Individual Time: [phone] and 1105-1130 PT Individual Time Calculation (min): 27 min 25 mins  Short Term Goals: Week 1:  PT Short Term Goal 1 (Week 1): Pt will tolerate sitting in appropriate wc x 1 hr to allow for participation in therapy session PT Short Term Goal 1 - Progress (Week 1): Met PT Short Term Goal 2 (Week 1): Pt will roll L/R w/mod assist and cues PT Short Term Goal 2 - Progress (Week 1): Met PT Short Term Goal 3 (Week 1): Pt will tolerate sitting on edge of bed/mat x 10 min w/occasional repositioning if needed and mod assist PT Short Term Goal 3 - Progress (Week 1): Not met Week 2:  PT Short Term Goal 1 (Week 2): pt to demonstrate upright sitting position at least ~75 degrees to increase safety with transfers and sitting tolerance PT Short Term Goal 2 (Week 2): pt to demonstrate supine>sit mod A x1 PT Short Term Goal 3 (Week 2): Pt will tolerate sitting on edge of bed/mat x 10 min w/occasional repositioning if needed and mod assist  Skilled Therapeutic Interventions/Progress Updates:    pt received in bed crying. Pt reported, "it hurts to blink". Pt had not eaten breakfast, PT asked he wanted to try anything on his tray, pt declined reporting he felt nauseous. PT attempted to encourage any food on tray pt only wanted to attempt fruit. Pt unable to reach items on tray and unable to lift LUE to assist in feeding himself. PT attempted to raise HOB slightly for improved positioning, pt yelled out in pain unable to tolerate more than 30 degrees, PT assisted in holding fruit bowl for pt to use spoon with RUE to feed self, pt only ate 3 bites and reported he was unable to eat any more d/t nausea. Pt only wanted x2 sips of his drink and declined all other mobility. PT attempted to encourage pt to attempt repositioning or transfer OOB however pt  continued to decline despite best efforts. Pt left in supine, All needs in reach and in good condition. Call light in hand.  And alarm set. Nursing made aware of pt's nausea, pain and declining of further therapy.     Session 2: pt received in bed and agreeable to therapy however adamantly refused to transfer OOB or to EOB. Pt reported 8/10 in posterior neck and L shoulder. Pt directed in intervals of increased HOB elevation for increased tolerance to upright positioning. Pt initially at 11 degrees supine, increased to 32 degrees then 43 then 48 then 54 degrees each with 3 mins at interval. Pt denied all dizziness or lightheadedness with each increase but did report increased pain with each and slightly improved with increased time at each. Pt BP taken to be 105/74 at 48 degrees and 99/70 at 54 decrease with pt reporting slight lightheadedness after 3 mins at 54 degrees. Pt returned to 35 degrees with legs elevated slightly and BP taken to be 111/72. Pt left in supine, All needs in reach and in good condition. Call light in hand.  And alarm set at end of session.   Therapy Documentation Precautions:  Precautions Precautions: Cervical,Fall Required Braces or Orthoses: Cervical Brace Cervical Brace: Soft collar,For comfort Restrictions Weight Bearing Restrictions: No General: PT Amount of Missed Time (min): 33 Minutes PT Missed Treatment Reason: Pain Vital Signs:   Pain:   Mobility:  Locomotion :    Trunk/Postural Assessment :    Balance:   Exercises:   Other Treatments:      Therapy/Group: Individual Therapy  [name] 12/08/2020, 8:50 AM

## 2020-12-08 NOTE — Consult Note (Signed)
Neuropsychological Consultation   Patient:   Daniel Cline   DOB:   24-Mar-1956  MR Number:  [id number]  Location:  [facility] [address] CENTER A [address] [id number] [place] [address] Dept: [phone] Loc: [phone]           Date of Service:   12/08/2020  Start Time:   1 PM End Time:   2 PM  Provider/Observer:  Arley Phenix, Psy.D.       Clinical Neuropsychologist       Billing Code/Service: 18841  Chief Complaint:    [name] is a 65 year old male with history of uncontrolled type 2 diabetes, history of hypertension.  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  Patient was taken emergently to the OR for laminectomy and abscess evacuation on admission.  Patient was also found to have right arm abscess that was also drained by I&D by orthopedic surgery.  Patient was seen by infectious disease and identified MSSA and started on IV antibiotics.  Patient has continued to struggle with motivation and participating in therapeutic interventions.  Patient has long-term hard of hearing issues and is generally nave about overall medical status.  Reason for Service:  Patient was referred for neuropsychological consultation due to coping and adjustment issues and lack of initiative and motivation in therapeutic efforts.  Patient gets consumed by concerns around pain but then also has issues with arousal and drowsiness.  Below is the HPI for the current admission.  HPI: Patient is a 65 year old male with a history of uncontrolled type 2 diabetes (hemoglobin A1c of 13.9), as well as a history of hypertension, who was admitted to the hospital and found to have a cervical spine epidural abscess.  He was taken emergently to the OR and had a laminectomy and abscess evacuation on admission.  He also was found to have a right forearm abscess that was also drained  by I&D by orthopedic surgery.  He was seen by infectious disease due to his abscesses growing out MSSA.  He is on IV antibiotics and is on Ancef to complete a 6 weeks course.  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  Due to that and neurogenic bowel and bladder, and his impaired level of mobility, as well as his severe uncontrolled pain, he was felt to be appropriate to admit for inpatient rehab at [facility].  Current Status:  Patient was asleep when I entered the room and somewhat hard to awaken but was able to be awoken by voice interaction.  Displayed slurred speech some which could be explained by his lack of teeth impacting articulation.  Patient remained somewhat groggy and drowsy throughout.  He minimized his lack of activity in therapies and associated them with difficulties with this pain.  Patient denied severe depression and there is a question as to his longstanding cognitive status prior to these most recent events.  Behavioral Observation: [name]  presents as a 65 y.o.-year-old Right Caucasian Male who appeared his stated age. his dress was Appropriate and he was Well Groomed and his manners were Appropriate to the situation.  his participation was indicative of Appropriate and Inattentive behaviors.  There were physical disabilities noted.  he displayed an inappropriate level of cooperation and motivation.     Interactions:    Active Drowsy and Inattentive  Attention:   abnormal and attention span appeared shorter than expected for age  Memory:   abnormal; remote memory intact, recent memory impaired  Visuo-spatial:  not examined  Speech (Volume):  low  Speech:   slurred; garbled  Thought Process:  Coherent and Circumstantial  Though Content:  WNL; not suicidal and not homicidal  Orientation:   person and place  Judgment:   Poor  Planning:   Poor  Affect:    Blunted and  Lethargic  Mood:    Dysphoric  Insight:   Fair  Intelligence:   low  Medical History:   Past Medical History:  Diagnosis Date  . Diabetes mellitus without complication [facility])          Patient Active Problem List   Diagnosis Date Noted  . Neurogenic bowel 11/29/2020  . Neurogenic bladder 11/29/2020  . Cervical myelopathy (HCC) 11/28/2020  . Slow transit constipation   . Normocytic anemia   . Uncontrolled type 2 diabetes mellitus with hyperglycemia (HCC)   . Benign essential HTN   . HTN (hypertension) 11/24/2020  . Staph aureus infection 11/20/2020  . Pressure injury of skin 11/19/2020  . Hypertension associated with diabetes (HCC)   . Abscess 11/18/2020  . 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 No pertinent family history.  Impression/DX:  INRI [name] is a 65 year old male with history of uncontrolled type 2 diabetes, history of hypertension.  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  Patient was taken emergently to the OR for laminectomy and abscess evacuation on admission.  Patient was also found to have right arm abscess that was also drained by I&D by orthopedic surgery.  Patient was seen by infectious disease and identified MSSA and started on IV antibiotics.  Patient has continued to struggle with motivation and participating in therapeutic interventions.  Patient has long-term hard of hearing issues and is generally nave about overall medical status.  Patient was asleep when I entered the room and somewhat hard to awaken but was able to be awoken by voice interaction.  Displayed slurred speech some which could be explained by his lack of teeth impacting articulation.  Patient remained somewhat groggy and drowsy throughout.  He minimized his lack of activity in therapies and associated them with  difficulties with this pain.  Patient denied severe depression and there is a question as to his longstanding cognitive status prior to these most recent events.  Disposition/Plan:  Today and attempt was made to review issues and concerns about his active participation in therapeutic efforts.  The patient tried to minimize and blame various issues on his lack of full participation.  We talked about the need for him to be involved in the level of therapies that he medically and physically could withstand and the opportunity has with the inpatient rehabilitation program to make significant gains.  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         Electronically Signed   _______________________ Arley Phenix, Psy.D. Clinical Neuropsychologist

## 2020-12-08 NOTE — Progress Notes (Signed)
Occupational Therapy Session Note  Patient Details  Name: Daniel Cline MRN: [id number] Date of Birth: 09-May-1956  Today's Date: 12/08/2020 OT Individual Time: 1405-1430 OT Individual Time Calculation (min): 25 min  and Today's Date: 12/08/2020 OT Missed Time: 20 Minutes Missed Time Reason: Pain;Patient fatigue   Short Term Goals: Week 1:  OT Short Term Goal 1 (Week 1): Pt will transition supine > sit Max A of 1 in prep for seated ADL OT Short Term Goal 2 (Week 1): Pt will perform seated EOB/EOM activity or ADL for 5 minutes OT Short Term Goal 3 (Week 1): Pt will improve R grip strength to 20# to improve self feeding/grooming OT Short Term Goal 4 (Week 1): Pt will perform UB dress with Mod A of 1  Skilled Therapeutic Interventions/Progress Updates:    Pt greeted almost supine in bed, head of bed at 15 degrees. Pt with eyes closed and did not want to open them. Pt needed max multimodal cues and encouragement to participate in therapy. Pt refused to attempt sitting EOB despite encouragement. Pt tolerated bringing HOB up to 51 degrees for 7 minutes. OT had pt reach for wash cloth to wash his face. Pt needed encouragement to even try to wash his face initially stating that he could not reach. Pt was able to reach his face with his R hand, but was not thorough. OT had pt try again to wash his face more thoroughly. Pt with poor frustration tolerance with OT asking pt to do this for himself. Pt agreeable to change shirts. Worked on pt pulling UEs through shirt sleeves, but still needed max A for this task. Pt unable to tolerated leaning forward to pull new shirt down, requiring rolling with max A to get shirt down over back. Pt reported max fatigue and closed his eyes. Pt declined further participation and left semi-reclined in bed at 20 degrees with nursing present to initiate IV.  Therapy Documentation Precautions:  Precautions Precautions: Cervical,Fall Required Braces or Orthoses: Cervical  Brace Cervical Brace: Soft collar,For comfort Restrictions Weight Bearing Restrictions: No General: General OT Amount of Missed Time: 20 Minutes Pain:  No number given, pt moaning in pain when HOB elevated, repositioned for comfort.   Therapy/Group: Individual Therapy  Mal Amabile 12/08/2020, 2:39 PM

## 2020-12-08 NOTE — Progress Notes (Signed)
Rec'd report from previous shift. Provided pt with dig stem. Medium soft dark brown stool

## 2020-12-08 NOTE — Progress Notes (Signed)
PROGRESS NOTE   Subjective/Complaints:  Pt reports things "about the same" pain wise- added Lidocaine patch to shoulder last night- pt didn't note much difference.  Said he voided this AM and "didn't require cath".  Thinks he emptied.   Fell asleep when saw him.  Of note, c/o nausea- hasn't asked for antinausea meds yet- called nursing for meds. Asked pt to not drink water/milk when nauseated- ask for gingerale.   ROS:   Pt denies SOB, abd pain, CP, N/V/C/D, and vision changes   Objective:   No results found. Recent Labs    12/07/20 0400  WBC 11.3*  HGB 10.5*  HCT 31.8*  PLT 441*   Recent Labs    12/07/20 0400  NA 131*  K 3.3*  CL 100  CO2 24  GLUCOSE 157*  BUN 26*  CREATININE 0.94  CALCIUM 8.6*    Intake/Output Summary (Last 24 hours) at 12/08/2020 0906 Last data filed at 12/07/2020 1813 Gross per 24 hour  Intake 240 ml  Output 750 ml  Net -510 ml        Physical Exam: Vital Signs Blood pressure 107/67, pulse 85, temperature 98 F (36.7 C), resp. rate 17, height '5\' 7"'  (1.702 m), weight 84.6 kg, SpO2 95 %.     General: awake, but really sleepy- kept nodding off; sitting up at 35-40 degrees initially, but wanted to lay back down some;  NAD HENT: conjugate gaze; oropharynx moist CV: regular rate; no JVD Pulmonary: CTA B/L; no W/R/R- good air movement GI: soft, NT, ND, (+)BS- normoactive; didn't eat much breakfast Psychiatric: appropriate- flat affect; less interactive due to sedation Neurological: overall alert  Musculoskeletal:     Comments: LUE-strength in his Left arm is 4-/5-in the deltoid, bicep, tricep, wrist extension, grip, and finger abduction RUE-deltoid bicep and tricep are 3/5, wrist extension and grip are 3-/5, LLE-4/5 in hip flexion, knee extension, and dorsiflexion as well as plantarflexion RLE-strength is 4-/5-in the same muscles TTP over L shoulder and L upper trap- muscles  feel pretty loose.  Not assessed today Skin:    General: Skin is warm and dry.     Comments:  PICC line in his left upper extremity looks good  Neurological:  He reports his light touch is intact in all 4 extremities  He has no lower extremity clonus bilaterally  Assessment/Plan: 1. Functional deficits which require 3+ hours per day of interdisciplinary therapy in a comprehensive inpatient rehab setting.  Physiatrist is providing close team supervision and 24 hour management of active medical problems listed below.  Physiatrist and rehab team continue to assess barriers to discharge/monitor patient progress toward functional and medical goals  Care Tool:  Bathing  Bathing activity did not occur: Safety/medical concerns (Per OT eval on medical hold) Body parts bathed by patient: Chest,Face,Left arm,Abdomen,Left upper leg,Right upper leg   Body parts bathed by helper: Front perineal area,Buttocks,Left upper leg,Right lower leg,Left lower leg,Right arm,Right upper leg     Bathing assist Assist Level: Total Assistance - Patient < 25%     Upper Body Dressing/Undressing Upper body dressing   What is the patient wearing?: Hospital gown only    Upper body assist  Assist Level: Maximal Assistance - Patient 25 - 49%    Lower Body Dressing/Undressing Lower body dressing      What is the patient wearing?: Pants     Lower body assist Assist for lower body dressing: Dependent - Patient 0%     Toileting Toileting    Toileting assist Assist for toileting: Dependent - Patient 0% (per staff  report, declined at eval. also limited by pain) Assistive Device Comment: foley   Transfers Chair/bed transfer  Transfers assist  Chair/bed transfer activity did not occur: Refused  Chair/bed transfer assist level: Dependent - Patient 0% (3 helpers via sheet)     Locomotion Ambulation   Ambulation assist   Ambulation activity did not occur: Safety/medical concerns           Walk 10 feet activity   Assist  Walk 10 feet activity did not occur: Safety/medical concerns        Walk 50 feet activity   Assist Walk 50 feet with 2 turns activity did not occur: Safety/medical concerns         Walk 150 feet activity   Assist Walk 150 feet activity did not occur: Safety/medical concerns         Walk 10 feet on uneven surface  activity   Assist Walk 10 feet on uneven surfaces activity did not occur: Safety/medical concerns         Wheelchair     Assist Will patient use wheelchair at discharge?: Yes Type of Wheelchair: Manual Wheelchair activity did not occur: Refused  Wheelchair assist level: Dependent - Patient 0%      Wheelchair 50 feet with 2 turns activity    Assist    Wheelchair 50 feet with 2 turns activity did not occur: Refused   Assist Level: Dependent - Patient 0%   Wheelchair 150 feet activity     Assist  Wheelchair 150 feet activity did not occur: Refused   Assist Level: Dependent - Patient 0%   Blood pressure 107/67, pulse 85, temperature 98 F (36.7 C), resp. rate 17, height '5\' 7"'  (1.702 m), weight 84.6 kg, SpO2 95 %.  Medical Problem List and Plan: 1.  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 Imaging?  4/6- con't PT and OT- [facility] feels no imaging is required- thinks its meningeal irritation- doing better today.  4/12- con't PT and OT- moved to 15/7 due to impaired ability to do 3 hours/day 2.  Antithrombotics: -DVT/anticoagulation:  Pharmaceutical: Eliquis restarted due to acute DVT in peroneal/soleus             -antiplatelet therapy: N/A 3. Pain Management: Patient is already receiving oxycodone as  needed; we started tramadol scheduled but that is not working; will DC tramadol and schedule MS Contin 15 mg twice daily  4/5- will add Valium scheduled for muscle spasms?   4/6-Valium 4 mg twice a day, as well as the new as needed Fioricet appears to be helping his headaches and neck pain-continue regimen; will titrate as required  4/8- will increase MS Contin to 30 mg BID  from 15 mg BID Patient is now lethargic will reduce MS Continto 18m BID, no pain c/os  4/11- pt still rating pain 8-9/10- but stable- will increase lidodemr patches to have 1 also on L shoulder  4/12- doesn't rate pain any less- regardless- also falling asleep, so cannot increase pain meds 4. Mood: - N/A- will see if pt needs to work with Neuropsychology due to uncontrolled pain and new SCI.              -antipsychotic agents: N/A 5. Neuropsych: This patient is capable of making decisions on his own behalf. 6. Skin/Wound Care: We will change his honeycomb dressing on his neck to daily and as needed; will also change the dressing on his right forearm to just a dry dressing and change as needed. Less drainage today, will not require I&D  4/8- no drainage seen- con't daily dry dressing and prn 7. Fluids/Electrolytes/Nutrition: We will check labs on Monday 8. Uncontrolled Diabetes II- A1c of 13.9-continue patient's Levemir 10 units nightly and sliding scale insulin  4/6-blood sugars are running 170s to 207-we will increase his Levemir to 12 units nightly and monitor  4/8- BGs 150-216- but all were 160 and under except x1- will increase Levemir to 14 units- will help wound healing  4/11- BGs 150s-250s- only 1 over 175- will increase Levemir to 16 units  4/12- BGs 119-151- better- con't regimen 9. Hypertension hx with Orthostatic hypotension-patient is on HCTZ 25 mg daily as well as Norvasc 10 mg daily and lisinopril 40 mg daily and Lopressor 50 mg twice daily-blood pressure is well controlled we will continue these medications and  monitor especially with increased activity  4/6- BP is well controlled-continue regimen  4/7- BP 99/58 laying down- will decrease Norvac to 5 mg daily and HCTZ to 12.5 mg daily- and monitor- also add Abd binder for low BP.   4/8- asked them to hold BP but not B blocker if BP meds held- will decrease Lisinopril to 20 mg daily.   4/11- Pulse better when not holding B blocker- BP soft, con't regimen for now Vitals:   12/07/20 1922 12/08/20 0449  BP: 102/68 107/67  Pulse: 88 85  Resp: 17 17  Temp: 98.2 F (36.8 C) 98 F (36.7 C)  SpO2: 98% 95%    4/12- BP soft- will check if orthostatic- since these were done laying down- might need ot decrease meds even more.  10.  Neurogenic bowel-we will look to start him on a bowel program nightly after dinner with Dulcolax suppositories and digital stimulation  4/5- on nightly bowel program  4/6-having results with nightly bowel program per patient-we will continue regimen  4/12- doing bowel program nightly- con't regimen 12.  Acute DVT in the soleus and peroneal-we will change his Lovenox to apixaban/Eliquis 10 mg BID x 1 week, then 5 mg BID.  13.  Neurogenic bladder-we will continue his Foley for now and start Flomax tomorrow 0.4 mg q. supper and see if he is able to start to void;  we might need to titrate this as required- will try and remove foley next week.   4/6-we will discuss removing the Foley tomorrow but will continue for the rest of today  4/7- will d/c foley today- and increase Flomax to 0.8 mg qsupper  4/8- no PVRs/bladder scans documented- will make sure he's scanned. Flomax at max dose. COn't regimen   4/11- still requiring caths pretty much every time- will d/w pt if wants to continue in/out  cath or whether he wants a foley- since those are are only choices- isn't really voiding.  14. Hypokalemia: supplement 37m BID today and repeat BMP tomorrow  4/8- K+ 3.5- borderline low- will recheck Monday  4/11- K+ 3.3 will replete 40 mEq x2 and  recheck Thursday  15. Epidural abscess-  4/5- on  Cefazolin IV for prolonged IV ABX- pt's WBC down to 15.5 (was 14k yesterday, but 20.8 3 days ago)- low grade temps up to 100.2 in last 24 hours-will con't regimen   4/6-White count is down slightly to 14K-we will continue IV antibiotics and check CRP and ESR-weekly  4/7- afebrile- will recheck labs in AM  4/8- WBC stable- con't IV ABX.   4/11- WBC down to 11.3- ESR >140 and CRP 10.9- heading a little better- con't to monitor.   LOS: 10 days A FACE TO FACE EVALUATION WAS PERFORMED  Daniel Cline 12/08/2020, 9:06 AM

## 2020-12-08 NOTE — Progress Notes (Signed)
Physical Therapy Note  Patient Details  Name: Daniel Cline MRN: [id number] Date of Birth: 02/13/1956 Today's Date: 12/08/2020    Pt's plan of care adjusted to 15/7 after speaking with care team and discussed with MD in team conference as pt currently unable to tolerate current therapy schedule with OT and PT.    Barbaraann Faster 12/08/2020, 7:45 AM

## 2020-12-08 NOTE — Progress Notes (Signed)
Physical Therapy Session Note  Patient Details  Name: Daniel Cline MRN: [id number] Date of Birth: 07/13/1956  Today's Date: 12/08/2020 PT Individual Time: 1761-6073 PT Individual Time Calculation (min): 14 min   Short Term Goals: Week 2:  PT Short Term Goal 1 (Week 2): pt to demonstrate upright sitting position at least ~75 degrees to increase safety with transfers and sitting tolerance PT Short Term Goal 2 (Week 2): pt to demonstrate supine>sit mod A x1 PT Short Term Goal 3 (Week 2): Pt will tolerate sitting on edge of bed/mat x 10 min w/occasional repositioning if needed and mod assist  Skilled Therapeutic Interventions/Progress Updates:    Pt sleeping on arrival, requiring tactile feedback and loud voice to awaken. HOB at 21 deg on arrival. Pt initially hesitant to participate in therapy session due to his back/neck pain, unrated. Pt also dosing off during conversation, eyes closed majority of session. Gradually raised HOB 21-25-30-35-40-45 deg while attempting to distract patient. Once 45deg reach, pt reporting untolerable pain and unable to be distracted, kept asking to lower his HOB. Attempted to perform bed level exercises but pt dosing off and unable to functionally participate. Ended session with HOB at 40 deg which pt was agreeable to. Pt missed 16 minutes of therapy due to pain.   Therapy Documentation Precautions:  Precautions Precautions: Cervical,Fall Required Braces or Orthoses: Cervical Brace Cervical Brace: Soft collar,For comfort Restrictions Weight Bearing Restrictions: No General: PT Amount of Missed Time (min): 16 Minutes PT Missed Treatment Reason: Pain  Therapy/Group: Individual Therapy  Brolin Dambrosia P Nanette Wirsing PT 12/08/2020, 12:36 PM

## 2020-12-08 NOTE — Progress Notes (Signed)
Patient ID: Daniel Cline, male   DOB: [birth year], 65 y.o.   MRN: [id number]  Met with pt and spoke with son-Daniel Cline via telephone to discuss team conference progress, participation and need for family conference to discuss concerns. Son is aware of pt's pain issues and the need for him to push himself. Concern is pt needs to get out of bed and move and the longer he stays in bed the worse he will be. Discussed his current level and the fact he will be going home from here and does not have SNF option since came to rehab. Discussed with pt the fact he will have pain and he needs to push through it. Have scheduled family conference for next Thursday 4/21 @ 10:00 son and daughter in-law will be here, and will include pt in meeting. Will ask neuro-psych to see pt also. Continue to work on discharge plans.

## 2020-12-09 LAB — GLUCOSE, CAPILLARY
Glucose-Capillary: 115 mg/dL — ABNORMAL HIGH (ref 70–99)
Glucose-Capillary: 117 mg/dL — ABNORMAL HIGH (ref 70–99)
Glucose-Capillary: 120 mg/dL — ABNORMAL HIGH (ref 70–99)
Glucose-Capillary: 197 mg/dL — ABNORMAL HIGH (ref 70–99)

## 2020-12-09 MED ORDER — LISINOPRIL 10 MG PO TABS
10.0000 mg | ORAL_TABLET | Freq: Every day | ORAL | Status: DC
  Administered 2020-12-10 – 2020-12-12 (×3): 10 mg via ORAL
  Filled 2020-12-09 (×3): qty 1

## 2020-12-09 MED ORDER — DULOXETINE HCL 30 MG PO CPEP
30.0000 mg | ORAL_CAPSULE | Freq: Every day | ORAL | Status: DC
  Administered 2020-12-09 – 2020-12-11 (×3): 30 mg via ORAL
  Filled 2020-12-09 (×3): qty 1

## 2020-12-09 MED ORDER — TAMSULOSIN HCL 0.4 MG PO CAPS
0.8000 mg | ORAL_CAPSULE | Freq: Every day | ORAL | Status: DC
  Administered 2020-12-09 – 2020-12-30 (×22): 0.8 mg via ORAL
  Filled 2020-12-09 (×22): qty 2

## 2020-12-09 NOTE — Progress Notes (Signed)
PROGRESS NOTE   Subjective/Complaints:  Pt reports bowel program doing OK0 getting cathed, he thinks- he said they "empty him" but he doesn't quite know how.   Increase Flomax to see if can get pt to void/empty.  Explained to pt that every time I've tried to increase pain meds, he gets confused/sedated- will try Duloxetine, since not usually sedating- nightly to help control pain- he agreed.   Also discussed at length that he needs to work more with therapy, not refuse therapies, and try to work on transfers- he cannot go home like this.   ROS:   Pt denies SOB, abd pain, CP, N/V/C/D, and vision changes  Objective:   No results found. Recent Labs    12/07/20 0400  WBC 11.3*  HGB 10.5*  HCT 31.8*  PLT 441*   Recent Labs    12/07/20 0400  NA 131*  K 3.3*  CL 100  CO2 24  GLUCOSE 157*  BUN 26*  CREATININE 0.94  CALCIUM 8.6*    Intake/Output Summary (Last 24 hours) at 12/09/2020 1920 Last data filed at 12/09/2020 1811 Gross per 24 hour  Intake 398 ml  Output 1700 ml  Net -1302 ml        Physical Exam: Vital Signs Blood pressure (!) 90/56, pulse 83, temperature 98.2 F (36.8 C), resp. rate 18, height '5\' 7"'  (1.702 m), weight 84.6 kg, SpO2 97 %.      General: awake, alert, appropriate,  Appears more comfortable; laying in place- never on side; advised pt to lay on side not just back; NAD HENT: conjugate gaze; oropharynx moist CV: regular rate; no JVD Pulmonary: CTA B/L; no W/R/R- good air movement GI: soft, NT, ND, (+)BS- hypoactive Psychiatric: flat, not real interactive; HOH Neurological: alert- but tried to avoid talking to me.   Musculoskeletal:     Comments: LUE-strength in his Left arm is 4-/5-in the deltoid, bicep, tricep, wrist extension, grip, and finger abduction RUE-deltoid bicep and tricep are 3/5, wrist extension and grip are 3-/5, LLE-4/5 in hip flexion, knee extension, and dorsiflexion  as well as plantarflexion RLE-strength is 4-/5-in the same muscles TTP over L shoulder and L upper trap- muscles feel pretty loose.  Not assessed today Skin:    General: Skin is warm and dry.     Comments:  PICC line in his left upper extremity looks good  Neurological:  He reports his light touch is intact in all 4 extremities He has no lower extremity clonus bilaterally  Assessment/Plan: 1. Functional deficits which require 3+ hours per day of interdisciplinary therapy in a comprehensive inpatient rehab setting.  Physiatrist is providing close team supervision and 24 hour management of active medical problems listed below.  Physiatrist and rehab team continue to assess barriers to discharge/monitor patient progress toward functional and medical goals  Care Tool:  Bathing  Bathing activity did not occur: Safety/medical concerns (Per OT eval on medical hold) Body parts bathed by patient: Chest,Face,Left arm,Abdomen,Left upper leg,Right upper leg   Body parts bathed by helper: Front perineal area,Buttocks,Left upper leg,Right lower leg,Left lower leg,Right arm,Right upper leg     Bathing assist Assist Level: Total Assistance - Patient < 25%  Upper Body Dressing/Undressing Upper body dressing   What is the patient wearing?: Hospital gown only    Upper body assist Assist Level: Maximal Assistance - Patient 25 - 49%    Lower Body Dressing/Undressing Lower body dressing      What is the patient wearing?: Pants     Lower body assist Assist for lower body dressing: Dependent - Patient 0%     Toileting Toileting    Toileting assist Assist for toileting: Dependent - Patient 0% (per staff  report, declined at eval. also limited by pain) Assistive Device Comment: foley   Transfers Chair/bed transfer  Transfers assist  Chair/bed transfer activity did not occur: Refused  Chair/bed transfer assist level: Dependent - Patient 0% (3 helpers via sheet)      Locomotion Ambulation   Ambulation assist   Ambulation activity did not occur: Safety/medical concerns          Walk 10 feet activity   Assist  Walk 10 feet activity did not occur: Safety/medical concerns        Walk 50 feet activity   Assist Walk 50 feet with 2 turns activity did not occur: Safety/medical concerns         Walk 150 feet activity   Assist Walk 150 feet activity did not occur: Safety/medical concerns         Walk 10 feet on uneven surface  activity   Assist Walk 10 feet on uneven surfaces activity did not occur: Safety/medical concerns         Wheelchair     Assist Will patient use wheelchair at discharge?: Yes Type of Wheelchair: Manual Wheelchair activity did not occur: Refused  Wheelchair assist level: Dependent - Patient 0%      Wheelchair 50 feet with 2 turns activity    Assist    Wheelchair 50 feet with 2 turns activity did not occur: Refused   Assist Level: Dependent - Patient 0%   Wheelchair 150 feet activity     Assist  Wheelchair 150 feet activity did not occur: Refused   Assist Level: Dependent - Patient 0%   Blood pressure (!) 90/56, pulse 83, temperature 98.2 F (36.8 C), resp. rate 18, height '5\' 7"'  (1.702 m), weight 84.6 kg, SpO2 97 %.  Medical Problem List and Plan: 1.  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  2.  Antithrombotics: -DVT/anticoagulation:  Pharmaceutical: Eliquis restarted due to acute DVT in peroneal/soleus              -antiplatelet therapy: N/A 3. Pain Management: Patient is already receiving oxycodone as needed; we started tramadol scheduled but that is not working; will DC tramadol and schedule MS Contin 15 mg twice daily  4/5- will add Valium scheduled for muscle spasms?   4/6-Valium 4 mg twice a day, as well as the new as needed Fioricet appears to be helping his headaches  and neck pain-continue regimen; will titrate as required  4/8- will increase MS Contin to 30 mg BID from 15 mg BID Patient is now lethargic will reduce MS Continto 110m BID, no pain c/os  4/11- pt still rating pain 8-9/10- but stable- will increase lidodemr patches to have 1 also on L shoulder  4/12- doesn't rate pain any less- regardless- also falling asleep, so cannot increase pain meds  4/12- will add Duloxetine 30 mg QHS for nerve pain-see if that helps 4. Mood: - N/A- will see if pt needs to work with Neuropsychology due to uncontrolled pain and new SCI.              -antipsychotic agents: N/A 5. Neuropsych: This patient is capable of making decisions on his own behalf. 6. Skin/Wound Care: We will change his honeycomb dressing on his neck to daily and as needed; will also change the dressing on his right forearm to just a dry dressing and change as needed. Less drainage today, will not require I&D  4/8- no drainage seen- con't daily dry dressing and prn 7. Fluids/Electrolytes/Nutrition: We will check labs on Monday 8. Uncontrolled Diabetes II- A1c of 13.9-continue patient's Levemir 10 units nightly and sliding scale insulin  4/6-blood sugars are running 170s to 207-we will increase his Levemir to 12 units nightly and monitor  4/8- BGs 150-216- but all were 160 and under except x1- will increase Levemir to 14 units- will help wound healing  4/11- BGs 150s-250s- only 1 over 175- will increase Levemir to 16 units  4/13- BGs 115-155- doing better- con't regimen 9. Hypertension hx with Orthostatic hypotension-patient is on HCTZ 25 mg  daily as well as Norvasc 10 mg daily and lisinopril 40 mg daily and Lopressor 50 mg twice daily-blood pressure is well controlled we will continue these medications and monitor especially with increased activity  4/6- BP is well controlled-continue regimen  4/7- BP 99/58 laying down- will decrease Norvac to 5 mg daily and HCTZ to 12.5 mg daily- and monitor- also add Abd binder for low BP.   4/8- asked them to hold BP but not B blocker if BP meds held- will decrease Lisinopril to 20 mg daily.   4/11- Pulse better when not holding B blocker- BP soft, con't regimen for now Vitals:   12/09/20 0403 12/09/20 1348  BP: 113/72 (!) 90/56  Pulse: 85 83  Resp: 18 18  Temp: 98.6 F (37 C) 98.2 F (36.8 C)  SpO2: 97% 97%    4/12- BP soft- will check if orthostatic- since these were done laying down- might need ot decrease meds even more.   4/13- BP low this afternoon- and OK this AM- will change Lisinopril to 10 mg nightly (from 20 mg daily)- and monitor 10.  Neurogenic bowel-we will look to start him on a bowel program nightly after dinner with Dulcolax suppositories and digital stimulation  4/5- on nightly bowel program  4/13- con't nightly bowel program 12.  Acute DVT in the soleus and peroneal-we will change his Lovenox to apixaban/Eliquis 10 mg BID x 1 week, then 5 mg BID.  13.  Neurogenic bladder-we will continue his Foley for now and start Flomax tomorrow 0.4 mg q. supper and see if he is able to start to void;  we might need to titrate this as required- will try and remove foley next week.   4/6-we will discuss removing the Foley tomorrow but will continue for the rest of today  4/7- will d/c foley  today- and increase Flomax to 0.8 mg qsupper  4/8- no PVRs/bladder scans documented- will make sure he's scanned. Flomax at max dose. COn't regimen   4/11- still requiring caths pretty much every time- will d/w pt if wants to continue in/out cath or whether he wants a foley- since those are are only  choices- isn't really voiding.   4/13- will increase Flomax to 0.8 mg nightly- and hope it's helpful 14. Hypokalemia: supplement 74m BID today and repeat BMP tomorrow  4/8- K+ 3.5- borderline low- will recheck Monday  4/11- K+ 3.3 will replete 40 mEq x2 and recheck Thursday  15. Epidural abscess-  4/5- on  Cefazolin IV for prolonged IV ABX- pt's WBC down to 15.5 (was 14k yesterday, but 20.8 3 days ago)- low grade temps up to 100.2 in last 24 hours-will con't regimen   4/6-White count is down slightly to 14K-we will continue IV antibiotics and check CRP and ESR-weekly  4/7- afebrile- will recheck labs in AM  4/8- WBC stable- con't IV ABX.   4/11- WBC down to 11.3- ESR >140 and CRP 10.9- heading a little better- con't to monitor.   LOS: 11 days A FACE TO FACE EVALUATION WAS PERFORMED  Daniel Cline 12/09/2020, 7:20 PM

## 2020-12-09 NOTE — Progress Notes (Addendum)
Pt states that dayshift "already cleaned me out" and pt reports having a BM. No BM reported on chart. Checked pt's rectal vault who still has stool present. Provided dig stem. Will recheck.

## 2020-12-09 NOTE — Progress Notes (Signed)
Occupational Therapy Weekly Progress Note  Patient Details  Name: Daniel Cline MRN: [id number] Date of Birth: 01/18/1956  Beginning of progress report period: December 01, 2020 End of progress report period: December 09, 2020  Today's Date: 12/09/2020 OT Individual Time: 7829-5621 OT Individual Time Calculation (min): 58 min    Patient has met 0 of 4 short term goals.  Pt has been severely limited by neck pain during OT sessions so that pt has only been able to tolerate HOB elevation to 40-50* for very short periods of time, unable to sit EOB d/t pain and discomfort, requires 3 person for sliding transfers to remain in supine for transfers to/from bed <> wheelchair, and performs grooming/oral hygiene tasks with Min A using environmental adaptations and built up handles. Pt's family members planning to be present for sessions to encourage participation, family conference planned in upcoming weeks to discuss DC planning and assist level at home as he is not eligible for SNF.  Patient continues to demonstrate the following deficits: muscle weakness, decreased cardiorespiratoy endurance, impaired timing and sequencing, abnormal tone, unbalanced muscle activation, decreased coordination and decreased motor planning, decreased motor planning, decreased attention, decreased awareness, decreased problem solving, decreased safety awareness and delayed processing and decreased sitting balance, decreased postural control and decreased balance strategies and therefore will continue to benefit from skilled OT intervention to enhance overall performance with BADL and Reduce care partner burden.  Patient slowly progressing toward long term goals..  Continue plan of care.  OT Short Term Goals Week 1:  OT Short Term Goal 1 (Week 1): Pt will transition supine > sit Max A of 1 in prep for seated ADL OT Short Term Goal 1 - Progress (Week 1): Not met OT Short Term Goal 2 (Week 1): Pt will perform seated EOB/EOM  activity or ADL for 5 minutes OT Short Term Goal 2 - Progress (Week 1): Not met OT Short Term Goal 3 (Week 1): Pt will improve R grip strength to 20# to improve self feeding/grooming OT Short Term Goal 3 - Progress (Week 1): Progressing toward goal OT Short Term Goal 4 (Week 1): Pt will perform UB dress with Mod A of 1 OT Short Term Goal 4 - Progress (Week 1): Not met Week 2:  OT Short Term Goal 1 (Week 2): Pt will improve R hand grip strength to 20# to perform with self feeding/grooming with Supervision OT Short Term Goal 2 (Week 2): Pt will transition supine > sit w/ Max of 1 in prep for ADL OT Short Term Goal 3 (Week 2): Pt will roll L/R with Max A of 1 to assist with bed level ADL OT Short Term Goal 4 (Week 2): Pt will tolerate HOB >50* elevation for 5+ minutes during IADL/ADL OT Short Term Goal 5 (Week 2): Pt will thread pants bed level with reacher Mod A  Skilled Therapeutic Interventions/Progress Updates:    Pt greeted at time of session supine in bed resting agreeable to OT session, uncomfortable and wanting to stay at bed level this am. Declined ADL as IV running and unable to change shirt this am. Upon returning to patient's room with items for activities, daughter in law heather was present and remained throughout remainder of session. Heather had many questions regarding diagnosis, prognosis, CLOF, DC planning, etc. And questions answered as able, recommended speak with nursing and MD for further medical questions. Reviewed with daughter in law limitations in therapy d/t patient's poor tolerance and decreased ability to sit up  more than 40-50 degrees. She reiterated plan is to take the pt home and to care for him there, even if he is still low level and needing max assist. Focus of session on NMR of BUEs, L>R with distal assist at wrist for bimanual tasks with lightweight therapy ball 3x10. Brushing teeth with Min A using build up handle, also discussion with family regarding possibly  purchasing build up handled utensils in future. Pt left in bed 42* elevation with call bell in reach alarm on.   Therapy Documentation Precautions:  Precautions Precautions: Cervical,Fall Required Braces or Orthoses: Cervical Brace Cervical Brace: Soft collar,For comfort Restrictions Weight Bearing Restrictions: No     Therapy/Group: Individual Therapy  Viona Gilmore 12/09/2020, 7:20 AM

## 2020-12-09 NOTE — Progress Notes (Signed)
Pt had small soft brown BM. No more stool in rectal vault.

## 2020-12-09 NOTE — Progress Notes (Signed)
Physical Therapy Session Note  Patient Details  Name: Daniel Cline MRN: [id number] Date of Birth: 1955-11-19  Today's Date: 12/09/2020 PT Individual Time: 1005-1030 PT Individual Time Calculation (min): 25 min   Short Term Goals: Week 1:  PT Short Term Goal 1 (Week 1): Pt will tolerate sitting in appropriate wc x 1 hr to allow for participation in therapy session PT Short Term Goal 1 - Progress (Week 1): Met PT Short Term Goal 2 (Week 1): Pt will roll L/R w/mod assist and cues PT Short Term Goal 2 - Progress (Week 1): Met PT Short Term Goal 3 (Week 1): Pt will tolerate sitting on edge of bed/mat x 10 min w/occasional repositioning if needed and mod assist PT Short Term Goal 3 - Progress (Week 1): Not met Week 2:  PT Short Term Goal 1 (Week 2): pt to demonstrate upright sitting position at least ~75 degrees to increase safety with transfers and sitting tolerance PT Short Term Goal 2 (Week 2): pt to demonstrate supine>sit mod A x1 PT Short Term Goal 3 (Week 2): Pt will tolerate sitting on edge of bed/mat x 10 min w/occasional repositioning if needed and mod assist  Skilled Therapeutic Interventions/Progress Updates:   Pt received supine in bed and agreeable to PT at bed level reporting pain 8-9/10 in neck and radiating in back and BUE L>R. Pt attempted to perform supine>partial sit through rotational transition with request to perform with help only at the RUE ; unsuccessful. Pt then performed scooting to [facility] with +2 assist in supine with assist from pt with BLE. Rolling to the L with max assist for BLE and trunk control with use of chuck pad. Pt transitioning into 25% HOB elevation with PT supported BLE off EOB. Pt able to tolerate 25% for only 1 minute, prior to requesting to return to supine. Total A to transfer back in to supine.  Pt left supine in bed with call bell in reach and all needs met.       Therapy Documentation Precautions:  Precautions Precautions:  Cervical,Fall Required Braces or Orthoses: Cervical Brace Cervical Brace: Soft collar,For comfort Restrictions Weight Bearing Restrictions: No    Pain: Pain Assessment Pain Scale: 0-10 Pain Score: 8  Pain Type: Chronic pain Pain Location: Shoulder Pain Orientation: Posterior Pain Descriptors / Indicators: Aching Pain Frequency: Constant Pain Onset: On-going Patients Stated Pain Goal: 4 Pain Intervention(s): Medication (See eMAR)    Therapy/Group: Individual Therapy  Lorie Phenix 12/09/2020, 10:39 AM

## 2020-12-09 NOTE — Progress Notes (Signed)
Physical Therapy Session Note  Patient Details  Name: Daniel Cline MRN: [id number] Date of Birth: Jan 28, 1956  Today's Date: 12/09/2020 PT Individual Time: 1350-1420 PT Individual Time Calculation (min): 30 min   Short Term Goals: Week 2:  PT Short Term Goal 1 (Week 2): pt to demonstrate upright sitting position at least ~75 degrees to increase safety with transfers and sitting tolerance PT Short Term Goal 2 (Week 2): pt to demonstrate supine>sit mod A x1 PT Short Term Goal 3 (Week 2): Pt will tolerate sitting on edge of bed/mat x 10 min w/occasional repositioning if needed and mod assist  Skilled Therapeutic Interventions/Progress Updates:   Pt received supine in bed and agreeable to PT. Scooting to [facility] with +2 assist with pt assisting with BLE. Rolling to R and L x 2 with mod assist and use of chuck pad for assist at trunk. Supine> partial sitting in sidelying with use of elevated HOB feature. Pt able to tolerate 40 deg x 2 min and 45 deg x 1 min with slow progress to attain each final position. Pt performed LE therex SLR. Hip flexion, hip abduction x 5 BLE. Pt left supine in bed with call bell in reach and all needs met. Son requesting that therapy increase intensity, regardless of pt reported pain level to allow improved overall tolerance to activty.        Therapy Documentation Precautions:  Precautions Precautions: Cervical,Fall Required Braces or Orthoses: Cervical Brace Cervical Brace: Soft collar,For comfort Restrictions Weight Bearing Restrictions: No General: PT Amount of Missed Time (min): 10 Minutes PT Missed Treatment Reason: Pain Vital Signs: Therapy Vitals Temp: 98.2 F (36.8 C) Pulse Rate: 83 Resp: 18 BP: (!) 90/56 Patient Position (if appropriate): Lying Oxygen Therapy SpO2: 97 % O2 Device: Room Air Pain: Pain Assessment Pain Scale: 0-10 Pain Score: 8  Pain Location: Neck Pain Orientation: Left;Proximal;Posterior Pain Descriptors / Indicators:  Shooting Pain Onset: On-going    Therapy/Group: Individual Therapy  Lorie Phenix 12/09/2020, 3:28 PM

## 2020-12-10 ENCOUNTER — Inpatient Hospital Stay (HOSPITAL_COMMUNITY): Payer: Medicare Other

## 2020-12-10 LAB — URINALYSIS, ROUTINE W REFLEX MICROSCOPIC
Bilirubin Urine: NEGATIVE
Glucose, UA: NEGATIVE mg/dL
Hgb urine dipstick: NEGATIVE
Ketones, ur: NEGATIVE mg/dL
Leukocytes,Ua: NEGATIVE
Nitrite: NEGATIVE
Protein, ur: NEGATIVE mg/dL
Specific Gravity, Urine: 1.026 (ref 1.005–1.030)
pH: 5 (ref 5.0–8.0)

## 2020-12-10 LAB — BASIC METABOLIC PANEL
Anion gap: 9 (ref 5–15)
BUN: 21 mg/dL (ref 8–23)
CO2: 24 mmol/L (ref 22–32)
Calcium: 8.8 mg/dL — ABNORMAL LOW (ref 8.9–10.3)
Chloride: 101 mmol/L (ref 98–111)
Creatinine, Ser: 0.82 mg/dL (ref 0.61–1.24)
GFR, Estimated: >60 mL/min (ref >60)
Glucose, Bld: 129 mg/dL — ABNORMAL HIGH (ref 70–99)
Potassium: 3.9 mmol/L (ref 3.5–5.1)
Sodium: 134 mmol/L — ABNORMAL LOW (ref 135–145)

## 2020-12-10 LAB — CBC WITH DIFFERENTIAL/PLATELET
Abs Immature Granulocytes: 0.05 10*3/uL (ref 0.00–0.07)
Basophils Absolute: 0.1 10*3/uL (ref 0.0–0.1)
Basophils Relative: 1 %
Eosinophils Absolute: 0.5 10*3/uL (ref 0.0–0.5)
Eosinophils Relative: 5 %
HCT: 32.7 % — ABNORMAL LOW (ref 39.0–52.0)
Hemoglobin: 10.6 g/dL — ABNORMAL LOW (ref 13.0–17.0)
Immature Granulocytes: 1 %
Lymphocytes Relative: 22 %
Lymphs Abs: 2.2 10*3/uL (ref 0.7–4.0)
MCH: 31.1 pg (ref 26.0–34.0)
MCHC: 32.4 g/dL (ref 30.0–36.0)
MCV: 95.9 fL (ref 80.0–100.0)
Monocytes Absolute: 1.1 10*3/uL — ABNORMAL HIGH (ref 0.1–1.0)
Monocytes Relative: 11 %
Neutro Abs: 6 10*3/uL (ref 1.7–7.7)
Neutrophils Relative %: 60 %
Platelets: 477 10*3/uL — ABNORMAL HIGH (ref 150–400)
RBC: 3.41 MIL/uL — ABNORMAL LOW (ref 4.22–5.81)
RDW: 13.5 % (ref 11.5–15.5)
WBC: 10 10*3/uL (ref 4.0–10.5)
nRBC: 0 % (ref 0.0–0.2)

## 2020-12-10 LAB — GLUCOSE, CAPILLARY
Glucose-Capillary: 146 mg/dL — ABNORMAL HIGH (ref 70–99)
Glucose-Capillary: 131 mg/dL — ABNORMAL HIGH (ref 70–99)
Glucose-Capillary: 156 mg/dL — ABNORMAL HIGH (ref 70–99)
Glucose-Capillary: 144 mg/dL — ABNORMAL HIGH (ref 70–99)
Glucose-Capillary: 139 mg/dL — ABNORMAL HIGH (ref 70–99)

## 2020-12-10 IMAGING — CR DG ABDOMEN 1V
1 series · 1 of 1 positions shown · non-contrast
Comparison: [DATE]

CLINICAL DATA: Abdominal pain

EXAM:
ABDOMEN - 1 VIEW

[abdomen kub]
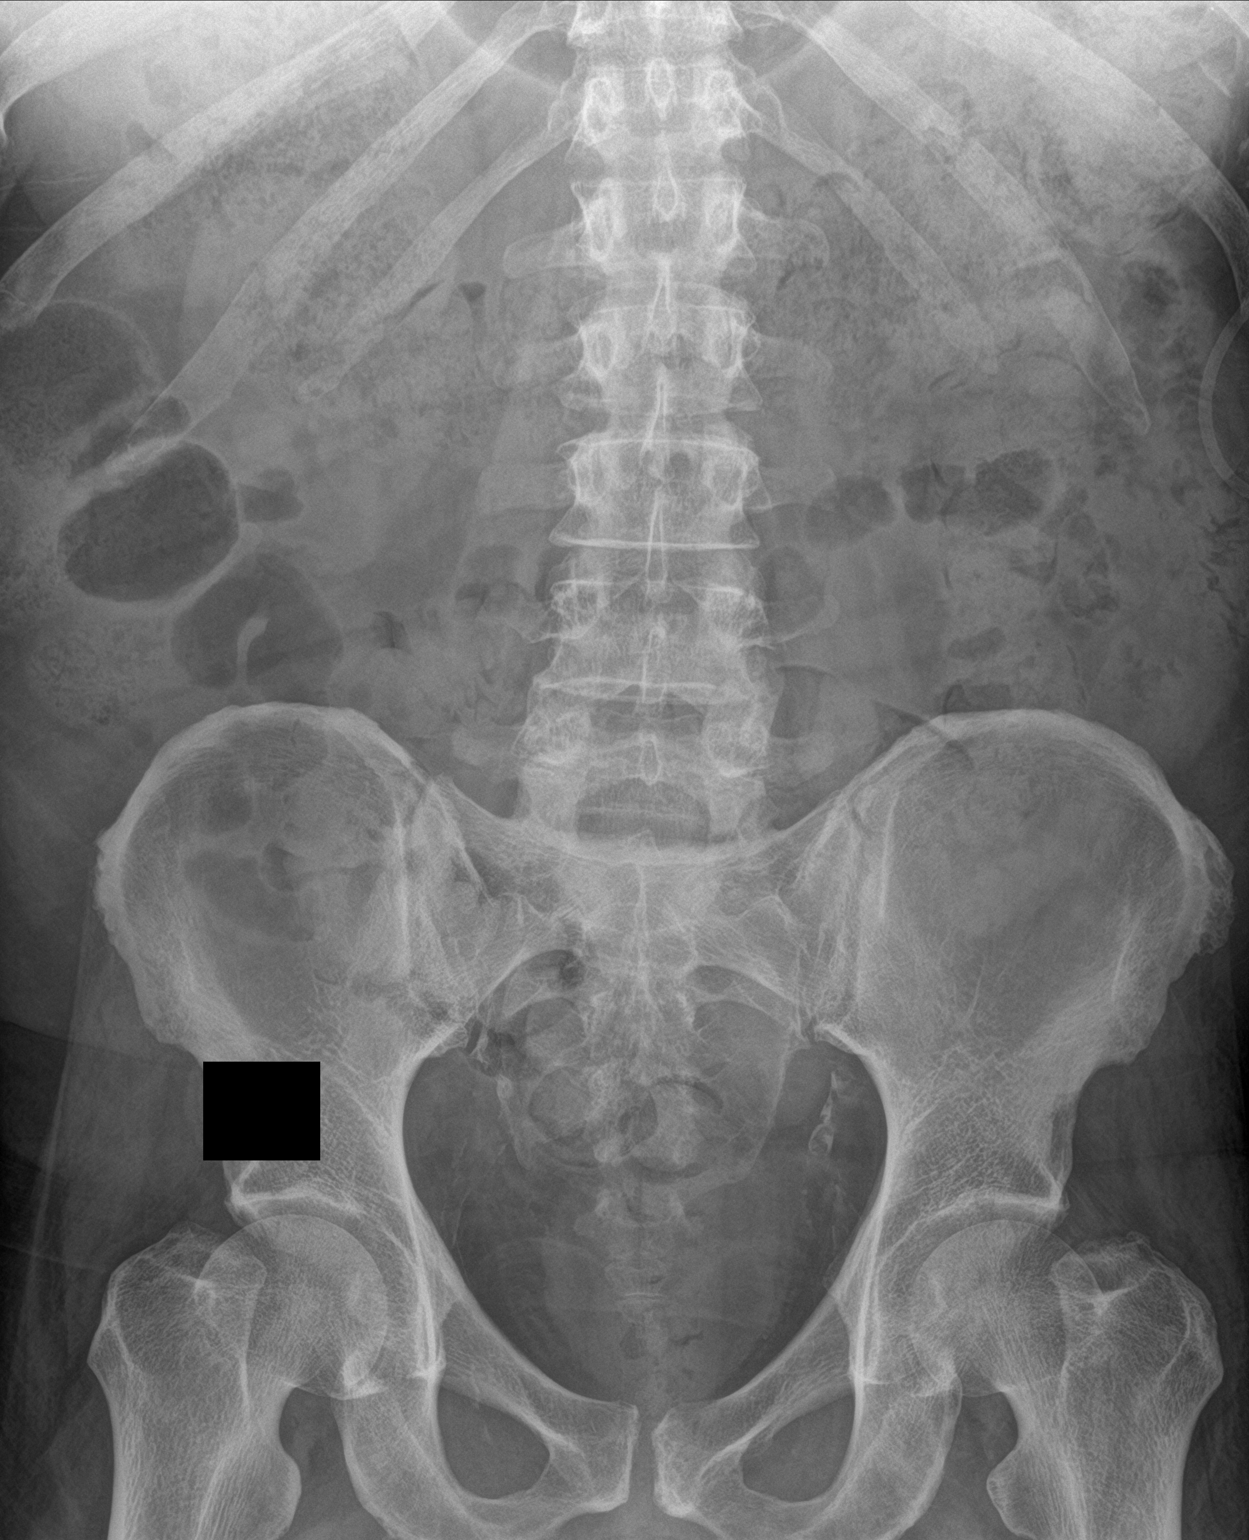

[1 of 1 positions shown; findings below may reference images not displayed]

FINDINGS: There is fairly diffuse stool throughout the colon. There is no
bowel dilatation or air-fluid level to suggest bowel obstruction. No
free air. There are pelvic arterial vascular calcifications.
IMPRESSION: Fairly diffuse stool throughout colon, a pattern that may be
indicative of a degree of constipation. No evident bowel obstruction
or free air on supine examination. Atherosclerotic arterial vascular
calcification in the pelvis.

## 2020-12-10 NOTE — Progress Notes (Addendum)
PROGRESS NOTE   Subjective/Complaints:  Pt reports he has nausea this AM- no resp Sx's, but did c/o food tasting "chalky". Didn't eat much breakfast.   Also c/o abd pain. - feels constipated- Getting Zofran for nausea.   ROS:   Pt denies SOB, abd pain, CP,, and vision changes   Objective:   No results found. Recent Labs    12/10/20 0422  WBC 10.0  HGB 10.6*  HCT 32.7*  PLT 477*   Recent Labs    12/10/20 0422  NA 134*  K 3.9  CL 101  CO2 24  GLUCOSE 129*  BUN 21  CREATININE 0.82  CALCIUM 8.8*    Intake/Output Summary (Last 24 hours) at 12/10/2020 0836 Last data filed at 12/10/2020 0650 Gross per 24 hour  Intake 300 ml  Output 1550 ml  Net -1250 ml        Physical Exam: Vital Signs Blood pressure 117/78, pulse 88, temperature 97.8 F (36.6 C), resp. rate 14, height '5\' 7"'  (1.702 m), weight 84.6 kg, SpO2 97 %.       General: awake, alert, appropriate, c/o nausea/constipation/abd pain; HOH; supine in bed; nurses in room;  NAD HENT: conjugate gaze; oropharynx moist CV: regular rate; no JVD Pulmonary: CTA B/L; no W/R/R- good air movement- sounds good GI: soft, mild TTP- , ND, (+)BS- hypoactive;  Psychiatric: appropriate Neurological: Ox3  Musculoskeletal:     Comments: LUE-strength in his Left arm is 4-/5-in the deltoid, bicep, tricep, wrist extension, grip, and finger abduction RUE-deltoid bicep and tricep are 3/5, wrist extension and grip are 3-/5, LLE-4/5 in hip flexion, knee extension, and dorsiflexion as well as plantarflexion RLE-strength is 4-/5-in the same muscles TTP over L shoulder and L upper trap- muscles feel pretty loose.  Not assessed today Skin:    General: Skin is warm and dry.     Comments:  PICC line in his left upper extremity looks good  Neurological:  He reports his light touch is intact in all 4 extremities He has no lower extremity clonus  bilaterally  Assessment/Plan: 1. Functional deficits which require 3+ hours per day of interdisciplinary therapy in a comprehensive inpatient rehab setting.  Physiatrist is providing close team supervision and 24 hour management of active medical problems listed below.  Physiatrist and rehab team continue to assess barriers to discharge/monitor patient progress toward functional and medical goals  Care Tool:  Bathing  Bathing activity did not occur: Safety/medical concerns (Per OT eval on medical hold) Body parts bathed by patient: Chest,Face,Left arm,Abdomen,Left upper leg,Right upper leg   Body parts bathed by helper: Front perineal area,Buttocks,Left upper leg,Right lower leg,Left lower leg,Right arm,Right upper leg     Bathing assist Assist Level: Total Assistance - Patient < 25%     Upper Body Dressing/Undressing Upper body dressing   What is the patient wearing?: Hospital gown only    Upper body assist Assist Level: Maximal Assistance - Patient 25 - 49%    Lower Body Dressing/Undressing Lower body dressing      What is the patient wearing?: Pants     Lower body assist Assist for lower body dressing: Dependent - Patient 0%  Toileting Toileting    Toileting assist Assist for toileting: Dependent - Patient 0% (per staff  report, declined at eval. also limited by pain) Assistive Device Comment: foley   Transfers Chair/bed transfer  Transfers assist  Chair/bed transfer activity did not occur: Refused  Chair/bed transfer assist level: Dependent - Patient 0% (3 helpers via sheet)     Locomotion Ambulation   Ambulation assist   Ambulation activity did not occur: Safety/medical concerns          Walk 10 feet activity   Assist  Walk 10 feet activity did not occur: Safety/medical concerns        Walk 50 feet activity   Assist Walk 50 feet with 2 turns activity did not occur: Safety/medical concerns         Walk 150 feet  activity   Assist Walk 150 feet activity did not occur: Safety/medical concerns         Walk 10 feet on uneven surface  activity   Assist Walk 10 feet on uneven surfaces activity did not occur: Safety/medical concerns         Wheelchair     Assist Will patient use wheelchair at discharge?: Yes Type of Wheelchair: Manual Wheelchair activity did not occur: Refused  Wheelchair assist level: Dependent - Patient 0%      Wheelchair 50 feet with 2 turns activity    Assist    Wheelchair 50 feet with 2 turns activity did not occur: Refused   Assist Level: Dependent - Patient 0%   Wheelchair 150 feet activity     Assist  Wheelchair 150 feet activity did not occur: Refused   Assist Level: Dependent - Patient 0%   Blood pressure 117/78, pulse 88, temperature 97.8 F (36.6 C), resp. rate 14, height '5\' 7"'  (1.702 m), weight 84.6 kg, SpO2 97 %.  Medical Problem List and Plan: 1.  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   4/14- refusing therapy again today due to "abd pain, and nausea"- con't PT and OT 2.  Antithrombotics: -DVT/anticoagulation:  Pharmaceutical: Eliquis restarted due to acute DVT in peroneal/soleus             -antiplatelet therapy: N/A 3. Pain Management: Patient is already receiving oxycodone as needed; we started tramadol scheduled but that is not working; will DC tramadol and schedule MS Contin 15 mg twice daily  4/5- will add Valium scheduled for muscle spasms?   4/6-Valium 4 mg twice a day, as  well as the new as needed Fioricet appears to be helping his headaches and neck pain-continue regimen; will titrate as required  4/8- will increase MS Contin to 30 mg BID from 15 mg BID Patient is now lethargic will reduce MS Continto 66m BID, no pain c/os  4/12- doesn't rate pain any less- regardless- also falling asleep, so cannot increase pain meds  4/12- will add Duloxetine  30 mg QHS for nerve pain-see if that helps 4. Mood: - N/A- will see if pt needs to work with Neuropsychology due to uncontrolled pain and new SCI.              -antipsychotic agents: N/A 5. Neuropsych: This patient is capable of making decisions on his own behalf. 6. Skin/Wound Care: We will change his honeycomb dressing on his neck to daily and as needed; will also change the dressing on his right forearm to just a dry dressing and change as needed. Less drainage today, will not require I&D  4/8- no drainage seen- con't daily dry dressing and prn 7. Fluids/Electrolytes/Nutrition: We will check labs on Monday 8. Uncontrolled Diabetes II- A1c of 13.9-continue patient's Levemir 10 units nightly and sliding scale insulin  4/6-blood sugars are running 170s to 207-we will increase his Levemir to 12 units nightly and monitor  4/8- BGs 150-216- but all were 160 and under except x1- will increase Levemir to 14 units- will help wound healing  4/11- BGs 150s-250s- only 1 over 175- will increase Levemir to 16 units  4/14- BGs overall controlled- 1 episode 197- con't regimen 9. Hypertension hx with Orthostatic hypotension-patient is on HCTZ 25 mg daily as well as Norvasc 10 mg daily and lisinopril 40 mg daily and Lopressor 50 mg twice daily-blood pressure is well controlled we will continue these medications and monitor especially with increased activity  4/6- BP is well controlled-continue regimen  4/7- BP 99/58 laying down- will decrease Norvac to 5 mg daily and HCTZ to 12.5 mg daily- and monitor- also add Abd binder for low BP.    4/8- asked them to hold BP but not B blocker if BP meds held- will decrease Lisinopril to 20 mg daily.   4/11- Pulse better when not holding B blocker- BP soft, con't regimen for now Vitals:   12/09/20 1946 12/10/20 0419  BP: 120/74 117/78  Pulse: 90 88  Resp: 16 14  Temp: 98.2 F (36.8 C) 97.8 F (36.6 C)  SpO2: 96% 97%    4/12- BP soft- will check if orthostatic- since these were done laying down- might need ot decrease meds even more.   4/13- BP low this afternoon- and OK this AM- will change Lisinopril to 10 mg nightly (from 20 mg daily)- and monitor  4/14 - BP controlled- con't regimen- might still need to titrate BP meds 10.  Neurogenic bowel-we will look to start him on a bowel program nightly after dinner with Dulcolax suppositories and digital stimulation  4/5- on nightly bowel program  4/13- con't nightly bowel program  4/14- will check KUB since nauseated and c/o constipation 12.  Acute DVT in the soleus and peroneal-we will change his Lovenox to apixaban/Eliquis 10 mg BID x 1 week, then 5 mg BID.  13.  Neurogenic bladder-we will continue his Foley for now and start Flomax tomorrow 0.4 mg q. supper and see if he is able to start to void;  we might need to titrate this as required- will try and remove foley next week.   4/6-we will discuss removing the Foley tomorrow but will continue for the rest of today  4/7- will d/c foley today- and increase Flomax to 0.8 mg qsupper  4/8- no PVRs/bladder scans documented- will make sure he's scanned. Flomax at max dose. COn't regimen   4/11- still requiring caths pretty much every time- will d/w pt if wants to continue in/out cath or whether he wants a foley- since  those are are only choices- isn't really voiding.   4/13- will increase Flomax to 0.8 mg nightly- and hope it's helpful  4/14- still requiring caths- if doesn't have results, soon, will likely need foley to go home with.  14. Hypokalemia: supplement 49m BID today and repeat  BMP tomorrow  4/8- K+ 3.5- borderline low- will recheck Monday  4/11- K+ 3.3 will replete 40 mEq x2 and recheck Thursday  15. Epidural abscess-  4/5- on  Cefazolin IV for prolonged IV ABX- pt's WBC down to 15.5 (was 14k yesterday, but 20.8 3 days ago)- low grade temps up to 100.2 in last 24 hours-will con't regimen   4/6-White count is down slightly to 14K-we will continue IV antibiotics and check CRP and ESR-weekly  4/7- afebrile- will recheck labs in AM  4/8- WBC stable- con't IV ABX.   4/11- WBC down to 11.3- ESR >140 and CRP 10.9- heading a little better- con't to monitor.  16/ Dysphagia?  4/14- they are still crushing meds- don't know why- will check to nursing to see if can be swallowed whole.    Addendum- got told that pt sat up with PT- and incision opened up and started draining a very large amount clear fluid.  [facility] called and will see what the plan is- nursing dressed it again.   LOS: 12 days A FACE TO FACE EVALUATION WAS PERFORMED  Miyanna Wiersma 12/10/2020, 8:36 AM

## 2020-12-10 NOTE — Progress Notes (Signed)
Patient appearing more lethargic and requiring more stimulation to arouse. MD aware, new orders obtained.

## 2020-12-10 NOTE — Progress Notes (Signed)
Nurse called into room by therapy, upon entry therapy states that incision on back of patient's neck is draining copious amounts of clear fluid. Upon inspection the sheets are covered in the clear fluid and Dan, PA is called to the room to do an assessment. No new orders at this time, dressing changed and patient not complaining of pain. Patient complaining of nausea and medication administered earlier this morning.

## 2020-12-10 NOTE — Progress Notes (Signed)
Physical Therapy Session Note  Patient Details  Name: Daniel Cline MRN: [id number] Date of Birth: 15-Aug-1956  Today's Date: 12/10/2020 PT Individual Time: 1400-1500 PT Individual Time Calculation (min): 60 min   Short Term Goals: Week 1:  PT Short Term Goal 1 (Week 1): Pt will tolerate sitting in appropriate wc x 1 hr to allow for participation in therapy session PT Short Term Goal 1 - Progress (Week 1): Met PT Short Term Goal 2 (Week 1): Pt will roll L/R w/mod assist and cues PT Short Term Goal 2 - Progress (Week 1): Met PT Short Term Goal 3 (Week 1): Pt will tolerate sitting on edge of bed/mat x 10 min w/occasional repositioning if needed and mod assist PT Short Term Goal 3 - Progress (Week 1): Not met Week 2:  PT Short Term Goal 1 (Week 2): pt to demonstrate upright sitting position at least ~75 degrees to increase safety with transfers and sitting tolerance PT Short Term Goal 2 (Week 2): pt to demonstrate supine>sit mod A x1 PT Short Term Goal 3 (Week 2): Pt will tolerate sitting on edge of bed/mat x 10 min w/occasional repositioning if needed and mod assist  Skilled Therapeutic Interventions/Progress Updates:    pt received in bed and agreeable to therapy son present throughout session. Pt educated in importance of mobility, increased tolerance to sitting upright, increased tolerance to activity. Pt directed in rolling to Lt side min A, extra time and single step cues to complete with BLE management to EOB, HOB elevated to assist in transfer to sitting EOB to 45 degrees then max A for trunk support into sitting with PT posterior to pt to allow improved safety and posterior support in sitting. Pt tolerated sitting upright max A -mod A for 1 min and 45s. Pt then max A to return to supine and total A x2 for upright scooting in bed. Pt and son educated on pts current status with PT, level of assist, and current needs. Pt setup at end of session at 38 degrees HOB elevation, son present,  All needs in reach and in good condition. Call light in hand.  And alarm set. Pt able to reach drink and demonstrated ability to reach and bring drink to lips for drinking.   Therapy Documentation Precautions:  Precautions Precautions: Cervical,Fall Required Braces or Orthoses: Cervical Brace Cervical Brace: Soft collar,For comfort Restrictions Weight Bearing Restrictions: No General:   Vital Signs: Therapy Vitals Temp: (!) 97.5 F (36.4 C) Pulse Rate: 87 Resp: 18 BP: 94/64 Patient Position (if appropriate): Lying Oxygen Therapy SpO2: 95 % O2 Device: Room Air Pain:   Mobility:   Locomotion :    Trunk/Postural Assessment :    Balance:   Exercises:   Other Treatments:      Therapy/Group: Individual Therapy  [name] 12/10/2020, 4:01 PM

## 2020-12-10 NOTE — Progress Notes (Signed)
Physical Therapy Session Note  Patient Details  Name: Daniel Cline MRN: [id number] Date of Birth: 1955/09/12  Today's Date: 12/10/2020 PT Individual Time: 0807-0850 PT Individual Time Calculation (min): 43 min   Short Term Goals: Week 2:  PT Short Term Goal 1 (Week 2): pt to demonstrate upright sitting position at least ~75 degrees to increase safety with transfers and sitting tolerance PT Short Term Goal 2 (Week 2): pt to demonstrate supine>sit mod A x1 PT Short Term Goal 3 (Week 2): Pt will tolerate sitting on edge of bed/mat x 10 min w/occasional repositioning if needed and mod assist  Skilled Therapeutic Interventions/Progress Updates:    Pt received supine in bed lethargic with eyes closed - pt remains lethargic throughout session often closing eyes requiring repeated tactile and verbal stimulus to open eyes and verbally respond to questions. HOB at 25degrees to start session. Pt agreeable to therapy session though reports he is not feeling well stating he is "nauseous" from his breakfast. RN provided pt with nausea medicine prior to therapy session. Supine in bed donned pants, thigh high TED hose, and shoes max assist - rolling R/L in bed with mod assist during total assist to pull pants up over hips. Supine vitals: BP 91/57 (MAP 68), HR 85bpm.  With encouragement, pt agreeable to transition to sitting EOB today. Supine>L sidelying with mod assist for rolling and cuing for sequencing. L sidelying>sitting EOB with +2 total assist for trunk upright. Pt starts yelling out in pain stating "it hurts so bad" therapist provided pt with calming breathing strategies to assist with pain management - tolerated this position for 1 minute with mod assist for trunk control due to pain. Sit>supine with +2 total assist for trunk descent and B LE management into bed. While sitting EOB, pt noticed to have a significant rush of clear fluid draining from his posterior neck incision site - notified Carlena Sax, RN  who retrieved Jesusita Oka, [place] - both present to assess pt and PA reports no concerns at this time and that it is expected for pt to have drainage with upright positioning. Supine scoot towards HOB via +2 total assist. Supine>L sidelying mod assist while RN re-dressed incision. Pt left in bed with it repositioned towards the chair position with HOB elevated to 40 degrees - lines intact, needs in reach, and bed alarm on.   Therapy Documentation Precautions:  Precautions Precautions: Cervical,Fall Required Braces or Orthoses: Cervical Brace Cervical Brace: Soft collar,For comfort Restrictions Weight Bearing Restrictions: No  Pain:   Details above, unrated, RN premedicated - therapist provided rest breaks, repositioning, emotional support, breathing strategies, and relaxation for pain management.   Therapy/Group: Individual Therapy  Ginny Forth , PT, DPT, CSRS  12/10/2020, 7:44 AM

## 2020-12-10 NOTE — Progress Notes (Signed)
While eating dinner, patient not able to chew up chicken breast. Patient having to spit out chicken because he claims "it is hard to eat whole chicken breast when you have no teeth". Patient is requesting some kind of pureed diet moving forward. Was unable to chew up green beans as well.
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
## 2020-12-10 NOTE — Progress Notes (Signed)
Occupational Therapy Session Note  Patient Details  Name: Daniel Cline MRN: [id number] Date of Birth: April 19, 1956  Today's Date: 12/10/2020 OT Individual Time: 9169-4503 OT Individual Time Calculation (min): 45 min  and Today's Date: 12/10/2020 OT Missed Time: 15 Minutes Missed Time Reason: Pain;Patient unwilling/refused to participate without medical reason   Short Term Goals: Week 1:  OT Short Term Goal 1 (Week 1): Pt will transition supine > sit Max A of 1 in prep for seated ADL OT Short Term Goal 1 - Progress (Week 1): Not met OT Short Term Goal 2 (Week 1): Pt will perform seated EOB/EOM activity or ADL for 5 minutes OT Short Term Goal 2 - Progress (Week 1): Not met OT Short Term Goal 3 (Week 1): Pt will improve R grip strength to 20# to improve self feeding/grooming OT Short Term Goal 3 - Progress (Week 1): Progressing toward goal OT Short Term Goal 4 (Week 1): Pt will perform UB dress with Mod A of 1 OT Short Term Goal 4 - Progress (Week 1): Not met Week 2:  OT Short Term Goal 1 (Week 2): Pt will improve R hand grip strength to 20# to perform with self feeding/grooming with Supervision OT Short Term Goal 2 (Week 2): Pt will transition supine > sit w/ Max of 1 in prep for ADL OT Short Term Goal 3 (Week 2): Pt will roll L/R with Max A of 1 to assist with bed level ADL OT Short Term Goal 4 (Week 2): Pt will tolerate HOB >50* elevation for 5+ minutes during IADL/ADL OT Short Term Goal 5 (Week 2): Pt will thread pants bed level with reacher Mod A   Skilled Therapeutic Interventions/Progress Updates:    Pt greeted at time of session semireclined in bed with son Elta [place] present who remained throughout session. Pt eating pudding cup at beginning of session with regular plastic spoon, no built up handle and proceeded to do so for several bites. Initiated family ed for LB dressing, bed reclined to flat level and pt rolling L/R with use of hand rails Max A and doffed/donned shorts at bed  level, son assisting. Returned HOB to approx 40* incline as pt could tolerate, and at this time attempted to engage pt in supine > sit. Pt adamantly refusing stating that cannot tolerate neck pain, heated discussion between pt and family, trying to encourage pt to participate and importance of OOB tolerance. Despite son mark and therapist explanation and encouragement, continued to refuse multiple times. Extensive discussion with son and pt regarding CLOF, level of assist needed at home, and plan if pt does need to go home at this level. Encouraged pt to try again with therapy tomorrow to sit up to improve tolerance. Missed 15 minutes of OT d/t refusal and pain.   Therapy Documentation Precautions:  Precautions Precautions: Cervical,Fall Required Braces or Orthoses: Cervical Brace Cervical Brace: Soft collar,For comfort Restrictions Weight Bearing Restrictions: No     Therapy/Group: Individual Therapy  Viona Gilmore 12/10/2020, 9:16 AM

## 2020-12-11 LAB — COMPREHENSIVE METABOLIC PANEL
ALT: 9 U/L (ref 0–44)
AST: 18 U/L (ref 15–41)
Albumin: 2.1 g/dL — ABNORMAL LOW (ref 3.5–5.0)
Alkaline Phosphatase: 143 U/L — ABNORMAL HIGH (ref 38–126)
Anion gap: 6 (ref 5–15)
BUN: 19 mg/dL (ref 8–23)
CO2: 25 mmol/L (ref 22–32)
Calcium: 8.5 mg/dL — ABNORMAL LOW (ref 8.9–10.3)
Chloride: 98 mmol/L (ref 98–111)
Creatinine, Ser: 0.72 mg/dL (ref 0.61–1.24)
GFR, Estimated: >60 mL/min (ref >60)
Glucose, Bld: 105 mg/dL — ABNORMAL HIGH (ref 70–99)
Potassium: 3.6 mmol/L (ref 3.5–5.1)
Sodium: 129 mmol/L — ABNORMAL LOW (ref 135–145)
Total Bilirubin: 0.9 mg/dL (ref 0.3–1.2)
Total Protein: 6.7 g/dL (ref 6.5–8.1)

## 2020-12-11 LAB — CBC WITH DIFFERENTIAL/PLATELET
Abs Immature Granulocytes: 0.03 10*3/uL (ref 0.00–0.07)
Basophils Absolute: 0.1 10*3/uL (ref 0.0–0.1)
Basophils Relative: 1 %
Eosinophils Absolute: 0.5 10*3/uL (ref 0.0–0.5)
Eosinophils Relative: 5 %
HCT: 32 % — ABNORMAL LOW (ref 39.0–52.0)
Hemoglobin: 10.5 g/dL — ABNORMAL LOW (ref 13.0–17.0)
Immature Granulocytes: 0 %
Lymphocytes Relative: 24 %
Lymphs Abs: 2.4 10*3/uL (ref 0.7–4.0)
MCH: 30.7 pg (ref 26.0–34.0)
MCHC: 32.8 g/dL (ref 30.0–36.0)
MCV: 93.6 fL (ref 80.0–100.0)
Monocytes Absolute: 0.9 10*3/uL (ref 0.1–1.0)
Monocytes Relative: 9 %
Neutro Abs: 5.9 10*3/uL (ref 1.7–7.7)
Neutrophils Relative %: 61 %
Platelets: 434 10*3/uL — ABNORMAL HIGH (ref 150–400)
RBC: 3.42 MIL/uL — ABNORMAL LOW (ref 4.22–5.81)
RDW: 13.5 % (ref 11.5–15.5)
WBC: 9.8 10*3/uL (ref 4.0–10.5)
nRBC: 0 % (ref 0.0–0.2)

## 2020-12-11 LAB — URINE CULTURE: Culture: NO GROWTH

## 2020-12-11 LAB — GLUCOSE, CAPILLARY
Glucose-Capillary: 127 mg/dL — ABNORMAL HIGH (ref 70–99)
Glucose-Capillary: 110 mg/dL — ABNORMAL HIGH (ref 70–99)
Glucose-Capillary: 119 mg/dL — ABNORMAL HIGH (ref 70–99)
Glucose-Capillary: 120 mg/dL — ABNORMAL HIGH (ref 70–99)

## 2020-12-11 NOTE — Progress Notes (Signed)
Patient refused bowel program and suppository.

## 2020-12-11 NOTE — Progress Notes (Signed)
Occupational Therapy Session Note  Patient Details  Name: Daniel Cline MRN: [id number] Date of Birth: 1956-07-22  Today's Date: 12/11/2020 OT Individual Time: 4715-9539 OT Individual Time Calculation (min): 23 min    Short Term Goals: Week 1:  OT Short Term Goal 1 (Week 1): Pt will transition supine > sit Max A of 1 in prep for seated ADL OT Short Term Goal 1 - Progress (Week 1): Not met OT Short Term Goal 2 (Week 1): Pt will perform seated EOB/EOM activity or ADL for 5 minutes OT Short Term Goal 2 - Progress (Week 1): Not met OT Short Term Goal 3 (Week 1): Pt will improve R grip strength to 20# to improve self feeding/grooming OT Short Term Goal 3 - Progress (Week 1): Progressing toward goal OT Short Term Goal 4 (Week 1): Pt will perform UB dress with Mod A of 1 OT Short Term Goal 4 - Progress (Week 1): Not met  Skilled Therapeutic Interventions/Progress Updates:    1:1. Pt received in bed asleep requiring increased time to arouse. Pt reporting feeling "bad" and having an upset stomach. NT confirmed and stated pt hadnt eaten all day. OT encouraged pt to eat and drink protein drink. Pt able to reach onto table with RUE and grab lidded mug and support with LUE to bring to mouth with min A fading to set up. Pt given HOH A to hold onto applesauce cup with LUE and use RUE to peel up lid to cup. After able to self feed with cup in LUE and spoon in RUE with set up. Pt declines any further activity. Exited session with pt seated in bed, exit alarm on and call light in reach   Therapy Documentation Precautions:  Precautions Precautions: Cervical,Fall Required Braces or Orthoses: Cervical Brace Cervical Brace: Soft collar,For comfort Restrictions Weight Bearing Restrictions: No General:   Vital Signs: Therapy Vitals Temp: (!) 97.4 F (36.3 C) Pulse Rate: 82 Resp: 14 BP: (!) 83/57 Patient Position (if appropriate): Lying Oxygen Therapy SpO2: 96 % O2 Device: Room Air Pain:    ADL: ADL Eating: Not assessed Grooming: Not assessed Upper Body Bathing: Maximal assistance Where Assessed-Upper Body Bathing: Bed level Lower Body Bathing: Dependent Where Assessed-Lower Body Bathing: Bed level Upper Body Dressing: Dependent Lower Body Dressing: Dependent Where Assessed-Lower Body Dressing: Bed level Toileting: Not assessed Toilet Transfer: Not assessed Tub/Shower Transfer: Not assessed Walk-In Shower Transfer: Not assessed Vision   Perception    Praxis   Exercises:   Other Treatments:     Therapy/Group: Individual Therapy  [name] 12/11/2020, 2:57 PM

## 2020-12-11 NOTE — Progress Notes (Signed)
Physical Therapy Session Note  Patient Details  Name: Daniel Cline MRN: [id number] Date of Birth: [birth year]  Today's Date: 12/11/2020 PT Individual Time: 0900-1000 PT Individual Time Calculation (min): 60 min   Short Term Goals: Week 1:  PT Short Term Goal 1 (Week 1): Pt will tolerate sitting in appropriate wc x 1 hr to allow for participation in therapy session PT Short Term Goal 1 - Progress (Week 1): Met PT Short Term Goal 2 (Week 1): Pt will roll L/R w/mod assist and cues PT Short Term Goal 2 - Progress (Week 1): Met PT Short Term Goal 3 (Week 1): Pt will tolerate sitting on edge of bed/mat x 10 min w/occasional repositioning if needed and mod assist PT Short Term Goal 3 - Progress (Week 1): Not met Week 2:  PT Short Term Goal 1 (Week 2): pt to demonstrate upright sitting position at least ~75 degrees to increase safety with transfers and sitting tolerance PT Short Term Goal 2 (Week 2): pt to demonstrate supine>sit mod A x1 PT Short Term Goal 3 (Week 2): Pt will tolerate sitting on edge of bed/mat x 10 min w/occasional repositioning if needed and mod assist  Skilled Therapeutic Interventions/Progress Updates:    pt received in bed and agreeable to therapy. Daughter in law present for therapy, very polite and motivating for pt, DIL educated on pt's current status and level of assist with therapy with pt agreeable to this and present. Pt reporting he felt nauseous this AM and that he ate "a couple bites of breakfast". Pt directed in donning shorts mod A, min A for rolling, and total A for B TED hose donned all at bed level. Pt directed in lateral supine transfer from flat bed to reclined WC with x3 total A with pt able to hold head and BLE up to assist staff and did not yell out during transfer. Pt educated on improved tolerance to transfer, and reclined lifted to allow pt more upright sitting position with pt demonstrating improved sitting angle compared to last WC transfer. Pt  directed in improved head and BLE positioning in WC and scooting back in chair max A to complete. Pt reported he felt better sitting up this date but still nauseous but overall decreased pain. Pt BP taken to be 93/60 with heart rate at 76bpm. Pt denied dizziness or lightheadedness or change in vision. Pt left in recliner, All needs in reach and in good condition. Call light in hand.  And alarm belt set. Nursing and NCT educated on pt position.   Therapy Documentation Precautions:  Precautions Precautions: Cervical,Fall Required Braces or Orthoses: Cervical Brace Cervical Brace: Soft collar,For comfort Restrictions Weight Bearing Restrictions: No General:   Vital Signs:   Pain:   Mobility:   Locomotion :    Trunk/Postural Assessment :    Balance:   Exercises:   Other Treatments:      Therapy/Group: Individual Therapy  [name] 12/11/2020, 12:27 PM

## 2020-12-11 NOTE — Progress Notes (Signed)
Physical Therapy Session Note  Patient Details  Name: TREXTON ESCAMILLA MRN: [id number] Date of Birth: [birth year]  Today's Date: 12/11/2020 PT Individual Time: 3016-0109 PT Individual Time Calculation (min): 30 min   Short Term Goals: Week 1:  PT Short Term Goal 1 (Week 1): Pt will tolerate sitting in appropriate wc x 1 hr to allow for participation in therapy session PT Short Term Goal 1 - Progress (Week 1): Met PT Short Term Goal 2 (Week 1): Pt will roll L/R w/mod assist and cues PT Short Term Goal 2 - Progress (Week 1): Met PT Short Term Goal 3 (Week 1): Pt will tolerate sitting on edge of bed/mat x 10 min w/occasional repositioning if needed and mod assist PT Short Term Goal 3 - Progress (Week 1): Not met Week 2:  PT Short Term Goal 1 (Week 2): pt to demonstrate upright sitting position at least ~75 degrees to increase safety with transfers and sitting tolerance PT Short Term Goal 2 (Week 2): pt to demonstrate supine>sit mod A x1 PT Short Term Goal 3 (Week 2): Pt will tolerate sitting on edge of bed/mat x 10 min w/occasional repositioning if needed and mod assist Week 3:     Skilled Therapeutic Interventions/Progress Updates:    pt received in bed and agreeable to therapy. Son present. Pt reported he was feeling slightly better than this AM and requested to do bed exercises to "loosen up". Pt did not complain of pain throughout there ex, directed in 2x5 forward reaches with HOB elevated to 30 degrees to engage core and progress toward sitting, 2x10 leg lifts, hip/knee flexion, hip abduction, hip adduction. Pt then directed in rolling to Lt side and reaching for bedrail min A, PT attempted to direct pt in sitting EOB with use of increased HOB function however pt adamantly refused, "I'm not sitting up now". PT educated pt on importance of progressing to sitting, overall health benefits of mobility and increased participation with PT. Pt reported he understood and continued to refuse to sit  up reporting, "I just don't feel well." pt returned to supine, min A. Pt directed in scooting up in bed max Ax2 to complete with VC for increased BLE participation from pt. Pt left in bed, HOB at 40 degrees, All needs in reach and in good condition. Call light in hand.  And alarm set. Son present.   Therapy Documentation Precautions:  Precautions Precautions: Cervical,Fall Required Braces or Orthoses: Cervical Brace Cervical Brace: Soft collar,For comfort Restrictions Weight Bearing Restrictions: No General:   Vital Signs: Therapy Vitals Temp: (!) 97.4 F (36.3 C) Pulse Rate: 82 Resp: 14 BP: (!) 83/57 Patient Position (if appropriate): Lying Oxygen Therapy SpO2: 96 % O2 Device: Room Air Pain:   Mobility:   Locomotion :    Trunk/Postural Assessment :    Balance:   Exercises:   Other Treatments:      Therapy/Group: Individual Therapy  [name] 12/11/2020, 3:46 PM

## 2020-12-11 NOTE — Progress Notes (Signed)
PROGRESS NOTE   Subjective/Complaints:  Pt reports has less nausea- not gone, but much better this AM.  Pt also willing to continue Duloxetine for nerve pain, which could be  Making nausea worse.  Still tight/painful in neck and shoulders- likes patches for pain.   BM last night, but KUB showed diffuse amount of stool- so will give Sorbitol to get cleaned out.   NO more drainage overnight form dressing- didn't need to be changed.  Said cannot chew food because of lack of teeth.   ROS:   Pt denies SOB, abd pain, CP, N/V/C/D, and vision changes    Objective:   DG Abd 1 View  Result Date: 12/10/2020 CLINICAL DATA:  Abdominal pain EXAM: ABDOMEN - 1 VIEW COMPARISON:  November 26, 2020 FINDINGS: There is fairly diffuse stool throughout the colon. There is no bowel dilatation or air-fluid level to suggest bowel obstruction. No free air. There are pelvic arterial vascular calcifications. IMPRESSION: Fairly diffuse stool throughout colon, a pattern that may be indicative of a degree of constipation. No evident bowel obstruction or free air on supine examination. Atherosclerotic arterial vascular calcification in the pelvis. Electronically Signed   By: Lowella Grip III M.D.   On: 12/10/2020 09:33   Recent Labs    12/10/20 0422 12/11/20 0447  WBC 10.0 9.8  HGB 10.6* 10.5*  HCT 32.7* 32.0*  PLT 477* 434*   Recent Labs    12/10/20 0422 12/11/20 0447  NA 134* 129*  K 3.9 3.6  CL 101 98  CO2 24 25  GLUCOSE 129* 105*  BUN 21 19  CREATININE 0.82 0.72  CALCIUM 8.8* 8.5*    Intake/Output Summary (Last 24 hours) at 12/11/2020 0957 Last data filed at 12/11/2020 0854 Gross per 24 hour  Intake 410 ml  Output 1050 ml  Net -640 ml        Physical Exam: Vital Signs Blood pressure 108/75, pulse 94, temperature 98.1 F (36.7 C), resp. rate 14, height '5\' 7"'  (1.702 m), weight 84.6 kg, SpO2 97 %.        General: awake,  alert, appropriate, - laying/sitting up slightly in bed- nurse at bedside, needed A to roll in bed; NAD HENT: conjugate gaze; oropharynx moist- incision appears scabbed at top- and healed otherwise- scant drainage on dressing, light yellow- otherwise, looks good CV: regular rate; no JVD Pulmonary: CTA B/L; no W/R/R- good air movement GI: soft, NT, ND, (+)BS Psychiatric: appropriate- more interactive Neurological: Ox3; HOH- no significant change in sensory exam  Musculoskeletal:     Comments: LUE-strength in his Left arm is 4-/5-in the deltoid, bicep, tricep, wrist extension, grip, and finger abduction RUE-deltoid bicep and tricep are 3/5, wrist extension and grip are 3-/5, LLE-4/5 in hip flexion, knee extension, and dorsiflexion as well as plantarflexion RLE-strength is 4-/5-in the same muscles TTP over L shoulder and L upper trap- muscles feel pretty loose.  Not assessed today Skin:    General: Skin is warm and dry.     Comments:  PICC line in his left upper extremity looks good  Neurological:  He reports his light touch is intact in all 4 extremities He has no lower extremity  clonus bilaterally  Assessment/Plan: 1. Functional deficits which require 3+ hours per day of interdisciplinary therapy in a comprehensive inpatient rehab setting.  Physiatrist is providing close team supervision and 24 hour management of active medical problems listed below.  Physiatrist and rehab team continue to assess barriers to discharge/monitor patient progress toward functional and medical goals  Care Tool:  Bathing  Bathing activity did not occur: Safety/medical concerns (Per OT eval on medical hold) Body parts bathed by patient: Chest,Face,Left arm,Abdomen,Left upper leg,Right upper leg   Body parts bathed by helper: Front perineal area,Buttocks,Left upper leg,Right lower leg,Left lower leg,Right arm,Right upper leg     Bathing assist Assist Level: Total Assistance - Patient < 25%     Upper  Body Dressing/Undressing Upper body dressing   What is the patient wearing?: Hospital gown only    Upper body assist Assist Level: Maximal Assistance - Patient 25 - 49%    Lower Body Dressing/Undressing Lower body dressing      What is the patient wearing?: Pants     Lower body assist Assist for lower body dressing: Dependent - Patient 0%     Toileting Toileting    Toileting assist Assist for toileting: Dependent - Patient 0% (per staff  report, declined at eval. also limited by pain) Assistive Device Comment: foley   Transfers Chair/bed transfer  Transfers assist  Chair/bed transfer activity did not occur: Refused  Chair/bed transfer assist level: Dependent - Patient 0% (3 helpers via sheet)     Locomotion Ambulation   Ambulation assist   Ambulation activity did not occur: Safety/medical concerns          Walk 10 feet activity   Assist  Walk 10 feet activity did not occur: Safety/medical concerns        Walk 50 feet activity   Assist Walk 50 feet with 2 turns activity did not occur: Safety/medical concerns         Walk 150 feet activity   Assist Walk 150 feet activity did not occur: Safety/medical concerns         Walk 10 feet on uneven surface  activity   Assist Walk 10 feet on uneven surfaces activity did not occur: Safety/medical concerns         Wheelchair     Assist Will patient use wheelchair at discharge?: Yes Type of Wheelchair: Manual Wheelchair activity did not occur: Refused  Wheelchair assist level: Dependent - Patient 0%      Wheelchair 50 feet with 2 turns activity    Assist    Wheelchair 50 feet with 2 turns activity did not occur: Refused   Assist Level: Dependent - Patient 0%   Wheelchair 150 feet activity     Assist  Wheelchair 150 feet activity did not occur: Refused   Assist Level: Dependent - Patient 0%   Blood pressure 108/75, pulse 94, temperature 98.1 F (36.7 C), resp. rate  14, height '5\' 7"'  (1.702 m), weight 84.6 kg, SpO2 97 %.  Medical Problem List and Plan: 1.  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  Antithrombotics: -  DVT/anticoagulation:  Pharmaceutical: Eliquis restarted due to acute DVT in peroneal/soleus             -antiplatelet therapy: N/A 3. Pain Management: Patient is already receiving oxycodone as needed; we started tramadol scheduled but that is not working; will DC tramadol and schedule MS Contin 15 mg twice daily  - on Valium and Firoicet prn per NSU  4/15- on Duloxetine- nausea has improved, so will continue- suggest increasing next week 4. Mood: - N/A- will see if pt needs to work with Neuropsychology due to uncontrolled pain and new SCI.              -antipsychotic agents: N/A 5. Neuropsych: This patient is capable of making decisions on his own behalf. 6. Skin/Wound Care: We will change his honeycomb dressing on his neck to daily and as needed; will also change the dressing on his right forearm to just a dry dressing and change as needed. Less drainage today, will not require I&D  4/15- had drainage yesterday, but scant drainage today- almost healed incision- con't to follow/monitor 7. Fluids/Electrolytes/Nutrition: We will check labs on Monday 8. Uncontrolled Diabetes II- A1c of 13.9-continue patient's Levemir 10 units nightly and sliding scale insulin  4/15- BGs running 110s-140s on Levemir 16 units nightly- con't regimen 9. Hypertension hx with Orthostatic hypotension-patient is on HCTZ 25 mg daily as well as Norvasc 10 mg daily and lisinopril 40 mg daily and Lopressor 50 mg twice daily-blood pressure is well controlled we will continue these medications and monitor  especially with increased activity  4/15Have reduced Lisinopril, HCTZ, Norvasc and con't Lopressor- still having episode of orthostatic hypotension- might need additional reduction- don't decreased B Blocker due to tachycardia frequently.  Vitals:   12/10/20 1413 12/10/20 2034  BP: 94/64 108/75  Pulse: 87 94  Resp: 18 14  Temp: (!) 97.5 F (36.4 C) 98.1 F (36.7 C)  SpO2: 95% 97%    10.  Neurogenic bowel-we will look to start him on a bowel program nightly after dinner with Dulcolax suppository  4/15- KUB showed diffuse constipation- will give Sorbitol 60 ml to clean him out   12.  Acute DVT in the soleus and peroneal-we will change his Lovenox to apixaban/Eliquis 10 mg BID x 1 week, then 5 mg BID.  13.  Neurogenic bladder-we will continue his Foley for now and start Flomax tomorrow 0.4 mg q. supper and see if he is able to start to void;  we might need to titrate this as required- will try and remove foley next week.   4/14- still requiring caths- if doesn't have results, soon, will likely need foley to go home with.   4/15- voiding some, but on Max dose of Flomax- might need Foley if going to SNF or family wants it 13. Hypokalemia: supplement 83m BID today and repeat BMP tomorrow  4/8- K+ 3.5- borderline low- will recheck Monday  4/11- K+ 3.3 will replete 40 mEq x2 and recheck Thursday  15. Epidural abscess-  4/5- on  Cefazolin IV for prolonged IV ABX- pt's WBC down to 15.5 (was 14k yesterday, but 20.8 3 days ago)- low grade temps up to 100.2 in last 24 hours-will con't regimen   4/6-White count is down slightly to 14K-we will continue IV antibiotics and check CRP and ESR-weekly  4/7- afebrile- will recheck labs in AM  4/8- WBC stable- con't IV ABX.   4/11- WBC down to 11.3- ESR >140 and CRP 10.9- heading a little better- con't to  monitor.  16/ Dysphagia?  4/14- they are still crushing meds- don't know why- will check to nursing to see if can be swallowed whole.   4/15- will change  Diet to D2 due to lack of teeth, not due to dysphagia- doesn't need pills crushed   .   LOS: 13 days A FACE TO FACE EVALUATION WAS PERFORMED  Birgitta Uhlir 12/11/2020, 9:57 AM

## 2020-12-12 ENCOUNTER — Inpatient Hospital Stay (HOSPITAL_COMMUNITY): Payer: Medicare Other

## 2020-12-12 LAB — GLUCOSE, CAPILLARY
Glucose-Capillary: 110 mg/dL — ABNORMAL HIGH (ref 70–99)
Glucose-Capillary: 147 mg/dL — ABNORMAL HIGH (ref 70–99)
Glucose-Capillary: 131 mg/dL — ABNORMAL HIGH (ref 70–99)
Glucose-Capillary: 132 mg/dL — ABNORMAL HIGH (ref 70–99)

## 2020-12-12 IMAGING — DX DG ABDOMEN 1V
2 series · 2 of 2 positions shown · non-contrast
Comparison: [DATE]

CLINICAL DATA: Nausea and vomiting.

EXAM:
ABDOMEN - 1 VIEW

[abdomen supine (1 of 2)]
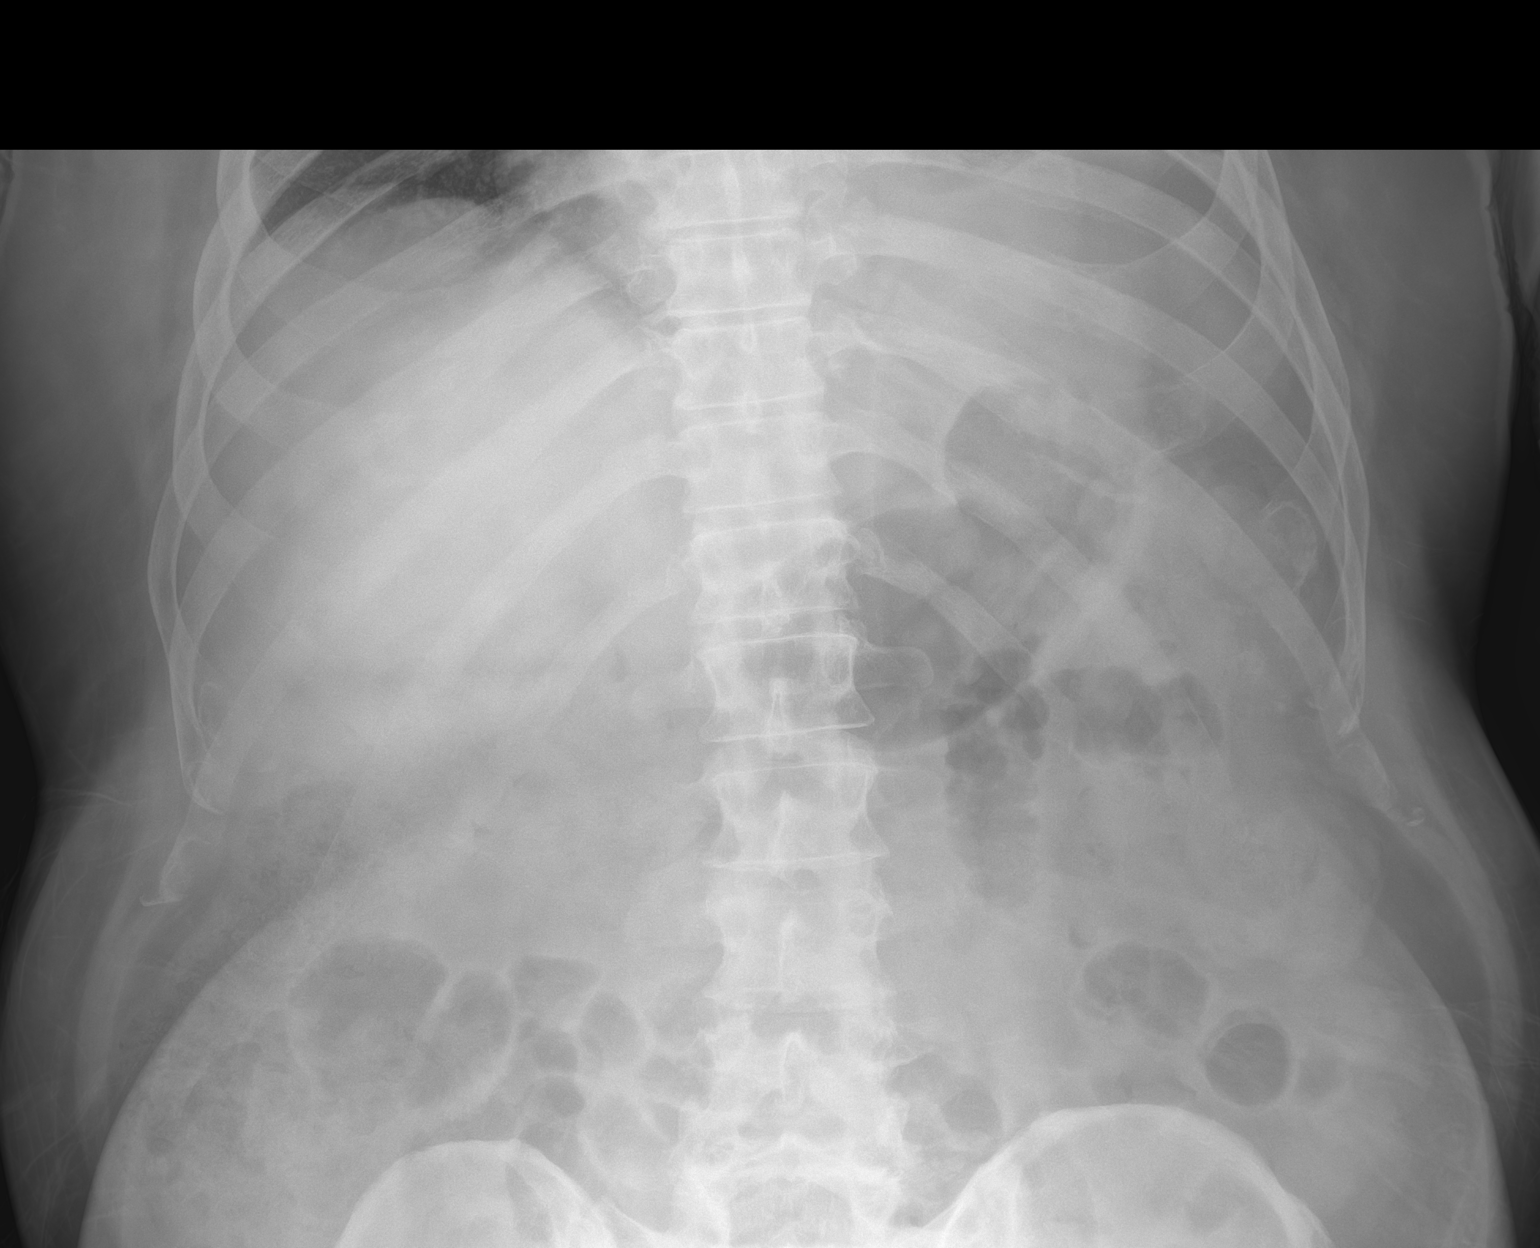

[abdomen supine (2 of 2)]
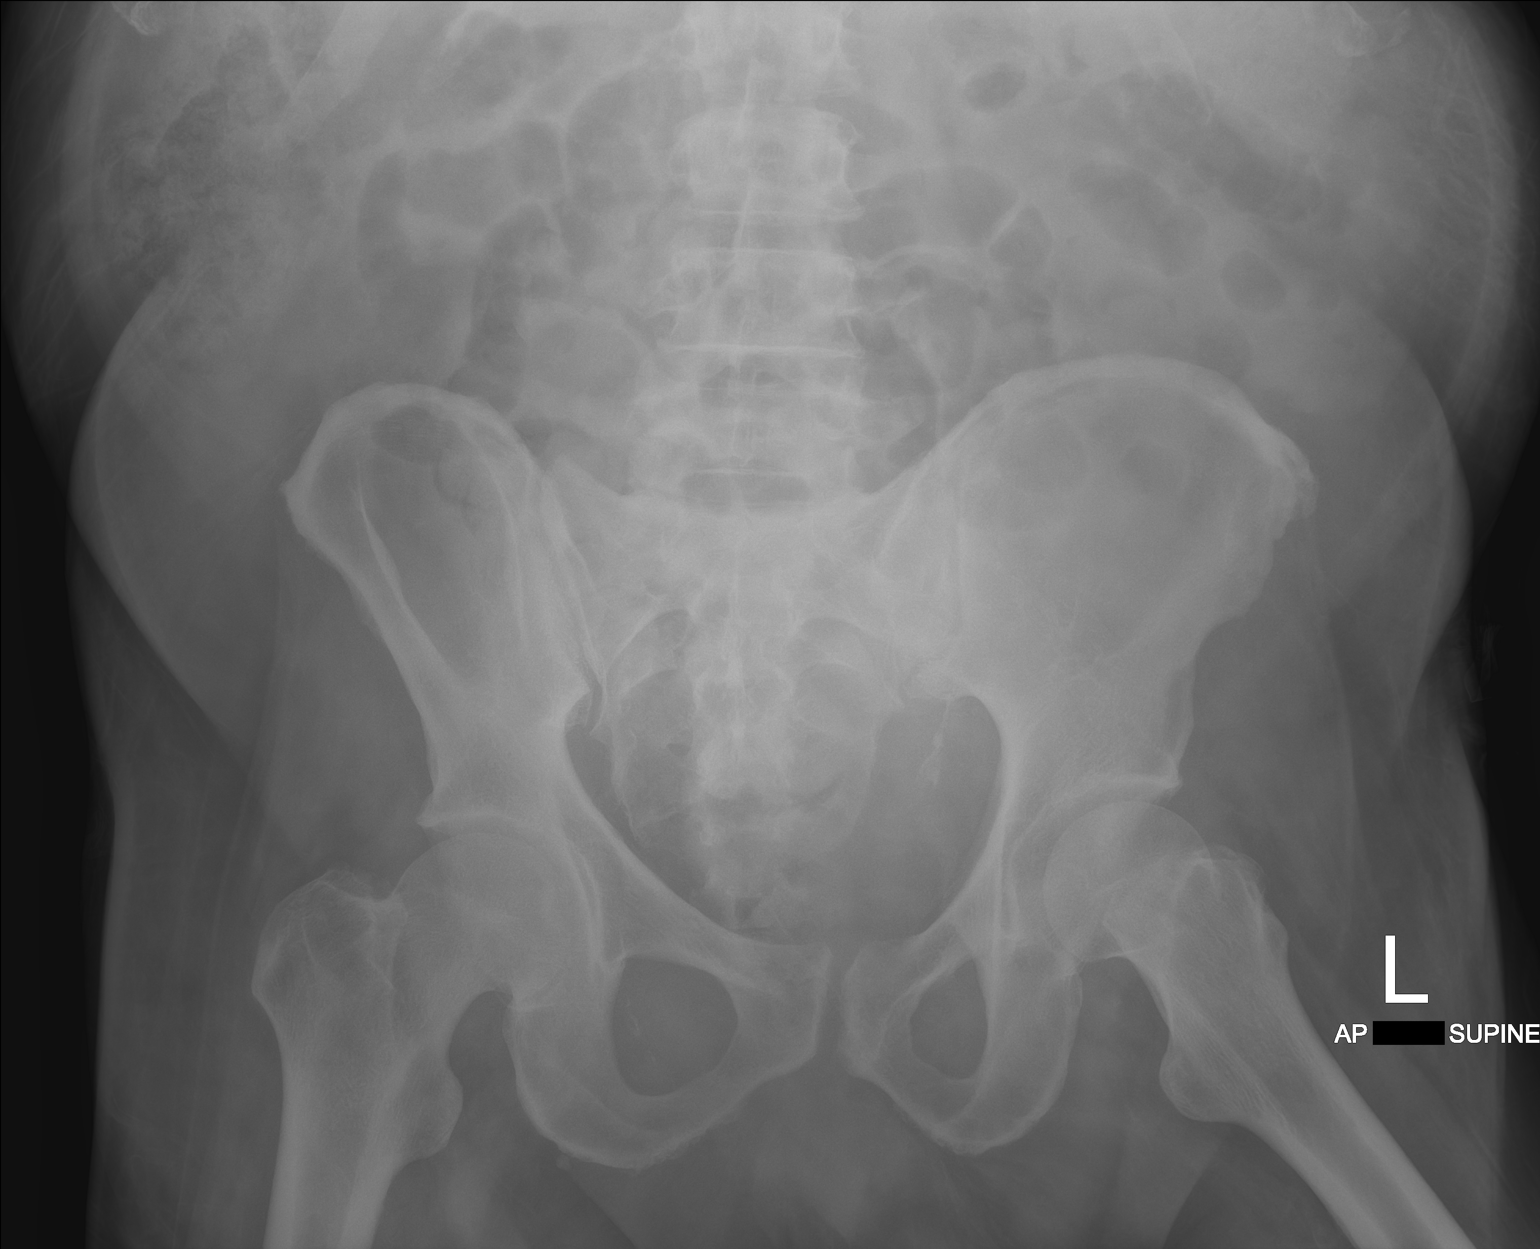

[2 of 2 positions shown; findings below may reference images not displayed]

FINDINGS: Bowel gas pattern is nonobstructed. Nondilated loops of large and
small bowel are present. There is no evidence for organomegaly. No
abnormal calcifications. Visualized osseous structures have a normal
appearance.

There is minimal LEFT LOWER lobe atelectasis or infiltrate.
IMPRESSION: Nonobstructive bowel gas pattern.

LEFT LOWER lobe atelectasis or infiltrate.

## 2020-12-12 MED ORDER — ENSURE ENLIVE PO LIQD
237.0000 mL | Freq: Two times a day (BID) | ORAL | Status: DC
  Administered 2020-12-12 – 2020-12-14 (×4): 237 mL via ORAL

## 2020-12-12 NOTE — Progress Notes (Signed)
PROGRESS NOTE   Subjective/Complaints: Having nausea and emesis this morning- KUB ordered.  He has not other complaints  ROS:   Pt denies SOB, abd pain, CP, and vision changes, +nausea and vomiting    Objective:   No results found. Recent Labs    12/10/20 0422 12/11/20 0447  WBC 10.0 9.8  HGB 10.6* 10.5*  HCT 32.7* 32.0*  PLT 477* 434*   Recent Labs    12/10/20 0422 12/11/20 0447  NA 134* 129*  K 3.9 3.6  CL 101 98  CO2 24 25  GLUCOSE 129* 105*  BUN 21 19  CREATININE 0.82 0.72  CALCIUM 8.8* 8.5*    Intake/Output Summary (Last 24 hours) at 12/12/2020 1058 Last data filed at 12/12/2020 0904 Gross per 24 hour  Intake 280 ml  Output 1416 ml  Net -1136 ml        Physical Exam: Vital Signs Blood pressure 119/72, pulse 97, temperature 99.8 F (37.7 C), temperature source Oral, resp. rate 16, height '5\' 7"'  (1.702 m), weight 84.6 kg, SpO2 96 %. Gen: no distress, normal appearing HEENT: oral mucosa pink and moist, NCAT Cardio: Reg rate Chest: normal effort, normal rate of breathing Abd: soft, non-distended Ext: no edema Psych: pleasant, normal affect Pulmonary: CTA B/L; no W/R/R- good air movement GI: soft, NT, ND, (+)BS Psychiatric: appropriate- more interactive Neurological: Ox3; HOH- no significant change in sensory exam  Musculoskeletal:     Comments: LUE-strength in his Left arm is 4-/5-in the deltoid, bicep, tricep, wrist extension, grip, and finger abduction RUE-deltoid bicep and tricep are 3/5, wrist extension and grip are 3-/5, LLE-4/5 in hip flexion, knee extension, and dorsiflexion as well as plantarflexion RLE-strength is 4-/5-in the same muscles TTP over L shoulder and L upper trap- muscles feel pretty loose.  Not assessed today Skin:    General: Skin is warm and dry.     Comments:  PICC line in his left upper extremity looks good  Neurological:  He reports his light touch is intact in  all 4 extremities He has no lower extremity clonus bilaterally  Assessment/Plan: 1. Functional deficits which require 3+ hours per day of interdisciplinary therapy in a comprehensive inpatient rehab setting.  Physiatrist is providing close team supervision and 24 hour management of active medical problems listed below.  Physiatrist and rehab team continue to assess barriers to discharge/monitor patient progress toward functional and medical goals  Care Tool:  Bathing  Bathing activity did not occur: Safety/medical concerns (Per OT eval on medical hold) Body parts bathed by patient: Chest,Face,Left arm,Abdomen,Left upper leg,Right upper leg   Body parts bathed by helper: Front perineal area,Buttocks,Left upper leg,Right lower leg,Left lower leg,Right arm,Right upper leg     Bathing assist Assist Level: Total Assistance - Patient < 25%     Upper Body Dressing/Undressing Upper body dressing   What is the patient wearing?: Hospital gown only    Upper body assist Assist Level: Maximal Assistance - Patient 25 - 49%    Lower Body Dressing/Undressing Lower body dressing      What is the patient wearing?: Pants     Lower body assist Assist for lower body dressing: Dependent -  Patient 0%     Toileting Toileting    Toileting assist Assist for toileting: Dependent - Patient 0% (per staff  report, declined at eval. also limited by pain) Assistive Device Comment: foley   Transfers Chair/bed transfer  Transfers assist  Chair/bed transfer activity did not occur: Refused  Chair/bed transfer assist level: Dependent - Patient 0% (3 helpers via sheet)     Locomotion Ambulation   Ambulation assist   Ambulation activity did not occur: Safety/medical concerns          Walk 10 feet activity   Assist  Walk 10 feet activity did not occur: Safety/medical concerns        Walk 50 feet activity   Assist Walk 50 feet with 2 turns activity did not occur: Safety/medical  concerns         Walk 150 feet activity   Assist Walk 150 feet activity did not occur: Safety/medical concerns         Walk 10 feet on uneven surface  activity   Assist Walk 10 feet on uneven surfaces activity did not occur: Safety/medical concerns         Wheelchair     Assist Will patient use wheelchair at discharge?: Yes Type of Wheelchair: Manual Wheelchair activity did not occur: Refused  Wheelchair assist level: Dependent - Patient 0%      Wheelchair 50 feet with 2 turns activity    Assist    Wheelchair 50 feet with 2 turns activity did not occur: Refused   Assist Level: Dependent - Patient 0%   Wheelchair 150 feet activity     Assist  Wheelchair 150 feet activity did not occur: Refused   Assist Level: Dependent - Patient 0%   Blood pressure 119/72, pulse 97, temperature 99.8 F (37.7 C), temperature source Oral, resp. rate 16, height '5\' 7"'  (1.702 m), weight 84.6 kg, SpO2 96 %.  Medical Problem List and Plan: 1.  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  Antithrombotics: -DVT/anticoagulation:  Pharmaceutical: Eliquis restarted due to acute DVT in peroneal/soleus             -antiplatelet therapy: N/A 3. Pain Management: Patient is already receiving oxycodone as needed; we started tramadol scheduled but that is not working; will DC tramadol and schedule MS Contin 15 mg twice daily  - on Valium and Firoicet prn per NSU  D/c cymbalta which may be causing emesis.  4. Mood: - N/A- will see if pt needs to work with Neuropsychology due to uncontrolled pain and new SCI.              -antipsychotic agents: N/A 5. Neuropsych: This patient is capable of making decisions  on his own behalf. 6. Skin/Wound Care: We will change his honeycomb dressing on his neck to daily and as needed; will also change the dressing on his right forearm to just a dry dressing and change as needed. Less drainage today, will not require I&D  4/15- had drainage yesterday, but scant drainage today- almost healed incision- con't to follow/monitor 7. Fluids/Electrolytes/Nutrition: We will check labs  on Monday 8. Uncontrolled Diabetes II- A1c of 13.9-continue patient's Levemir 10 units nightly and sliding scale insulin  4/15- BGs running 110s-140s on Levemir 16 units nightly- con't regimen 9. Hypertension hx with Orthostatic hypotension-patient is on HCTZ 25 mg daily as well as Norvasc 10 mg daily and lisinopril 40 mg daily and Lopressor 50 mg twice daily-blood pressure is well controlled we will continue these medications and monitor especially with increased activity  4/15Have reduced Lisinopril, HCTZ, Norvasc and con't Lopressor- still having episode of orthostatic hypotension- might need additional reduction- don't decreased B Blocker due to tachycardia frequently.  Vitals:   12/11/20 2046 12/12/20 0400  BP: 99/64 119/72  Pulse: 94 97  Resp:  16  Temp:  99.8 F (37.7 C)  SpO2:  96%    10.  Neurogenic bowel-we will look to start him on a bowel program nightly after dinner with Dulcolax suppository  4/15- KUB showed diffuse constipation- will give Sorbitol 60 ml to clean him out   12.  Acute DVT in the soleus and peroneal-we will change his Lovenox to apixaban/Eliquis 10 mg BID x 1 week, then 5 mg BID.  13.  Neurogenic bladder-we will continue his Foley for now and start Flomax tomorrow 0.4 mg q. supper and see if he is able to start to void;  we might need to titrate this as required- will try and remove foley next week.   4/14- still requiring caths- if doesn't have results, soon, will likely need foley to go home with.   4/15- voiding some, but on Max dose of Flomax- might need  Foley if going to SNF or family wants it 25. Hypokalemia: supplement 82m BID today and repeat BMP tomorrow  4/8- K+ 3.5- borderline low- will recheck Monday  4/11- K+ 3.3 will replete 40 mEq x2 and recheck Thursday  15. Epidural abscess-  4/5- on  Cefazolin IV for prolonged IV ABX- pt's WBC down to 15.5 (was 14k yesterday, but 20.8 3 days ago)- low grade temps up to 100.2 in last 24 hours-will con't regimen   4/6-White count is down slightly to 14K-we will continue IV antibiotics and check CRP and ESR-weekly  4/7- afebrile- will recheck labs in AM  4/8- WBC stable- con't IV ABX.   4/11- WBC down to 11.3- ESR >140 and CRP 10.9- heading a little better- con't to monitor.   4/15 WBC 9.8, monitor weekly 16. Dysphagia?  4/15- will change Diet to D2 due to lack of teeth, not due to dysphagia- doesn't need pills crushed. 17. Vomiting: d/c cymbalta which may be contributing. IV zofran. KUB ordered.     LOS: 14 days A FACE TO FACE EVALUATION WAS PERFORMED  KClide DeutscherRaulkar 12/12/2020, 10:58 AM

## 2020-12-12 NOTE — Progress Notes (Signed)
Occupational Therapy Session Note  Patient Details  Name: Daniel Cline MRN: [id number] Date of Birth: [birth year]  Today's Date: 12/12/2020 OT Individual Time: 0920-1015 OT Individual Time Calculation (min): 55 min    Short Term Goals: Week 1:  OT Short Term Goal 1 (Week 1): Pt will transition supine > sit Max A of 1 in prep for seated ADL OT Short Term Goal 1 - Progress (Week 1): Not met OT Short Term Goal 2 (Week 1): Pt will perform seated EOB/EOM activity or ADL for 5 minutes OT Short Term Goal 2 - Progress (Week 1): Not met OT Short Term Goal 3 (Week 1): Pt will improve R grip strength to 20# to improve self feeding/grooming OT Short Term Goal 3 - Progress (Week 1): Progressing toward goal OT Short Term Goal 4 (Week 1): Pt will perform UB dress with Mod A of 1 OT Short Term Goal 4 - Progress (Week 1): Not met  Skilled Therapeutic Interventions/Progress Updates:    1;1. Pt received in bed reporting having 2 vomiting episodes this morning. RN aware and already delivered IV zofran. Pt agreeable to UB washing with MOD A, dressing with MAX A (VC for hemi dressing techniques-HOH A RUE to thread head), and grooming with MIN A for opening toothpaste. RN threads IV through shirt. Pt rolls in B directions with MIN-mod A overall to pull shirt down back. Pt agreeable to UB therex. 3x1 min (~10-12 rotations) around portable arm bike with HOH A for LUE-MOD A for gravity prone positions and S for gravit assisted positions in rotation. Pt reporting some L shoulder discomfort but able to "work through it." prolonged rest breaks provided in between intervals. Exited session with pt seated in bed, exit alarm on and call light in reach   Therapy Documentation Precautions:  Precautions Precautions: Cervical,Fall Required Braces or Orthoses: Cervical Brace Cervical Brace: Soft collar,For comfort Restrictions Weight Bearing Restrictions: No General:   Vital Signs: Therapy Vitals Temp: 99.8 F  (37.7 C) Temp Source: Oral Pulse Rate: 97 Resp: 16 BP: 119/72 Patient Position (if appropriate): Lying Oxygen Therapy SpO2: 96 % O2 Device: Room Air Pain: Pain Assessment Pain Score: 0-No pain ADL: ADL Eating: Not assessed Grooming: Not assessed Upper Body Bathing: Maximal assistance Where Assessed-Upper Body Bathing: Bed level Lower Body Bathing: Dependent Where Assessed-Lower Body Bathing: Bed level Upper Body Dressing: Dependent Lower Body Dressing: Dependent Where Assessed-Lower Body Dressing: Bed level Toileting: Not assessed Toilet Transfer: Not assessed Tub/Shower Transfer: Not assessed Walk-In Shower Transfer: Not assessed Vision   Perception    Praxis   Exercises:   Other Treatments:     Therapy/Group: Individual Therapy  [name] 12/12/2020, 6:48 AM

## 2020-12-12 NOTE — Progress Notes (Signed)
Patient bowel program started at 1800 with digital stimulation. Patient presented with no stool in chamber. At 1815 no bowel presence of bowel movement and suppository introduced. 1830 patient did not have bowel movement and continued with digital stimulation. 1845 patient has not had bowel movement. Next shift will continue with bowel program. Cletis Media, LPN

## 2020-12-12 NOTE — Plan of Care (Signed)
  Problem: Consults Goal: RH SPINAL CORD INJURY PATIENT EDUCATION Description:  See Patient Education module for education specifics.  Outcome: Progressing Goal: Skin Care Protocol Initiated - if Braden Score 18 or less Description: If consults are not indicated, leave blank or document N/A Outcome: Progressing Goal: Nutrition Consult-if indicated Outcome: Progressing Goal: Diabetes Guidelines if Diabetic/Glucose > 140 Description: If diabetic or lab glucose is > 140 mg/dl - Initiate Diabetes/Hyperglycemia Guidelines & Document Interventions  Outcome: Progressing   Problem: SCI BOWEL ELIMINATION Goal: RH STG MANAGE BOWEL WITH ASSISTANCE Description: STG Manage Bowel with supervision Assistance. Outcome: Progressing   Problem: SCI BLADDER ELIMINATION Goal: RH STG MANAGE BLADDER WITH ASSISTANCE Description: STG Manage Bladder With supervision Assistance Outcome: Progressing   Problem: RH SKIN INTEGRITY Goal: RH STG SKIN FREE OF INFECTION/BREAKDOWN Description: Skin will remain free from infection/breakdown while on rehab. Outcome: Progressing Goal: RH STG ABLE TO PERFORM INCISION/WOUND CARE W/ASSISTANCE Description: STG Able To Perform Incision/Wound Care With supervision Assistance. Outcome: Progressing   Problem: RH SAFETY Goal: RH STG ADHERE TO SAFETY PRECAUTIONS W/ASSISTANCE/DEVICE Description: STG Adhere to Safety Precautions With supervision Assistance/Device. Outcome: Progressing Goal: RH STG DECREASED RISK OF FALL WITH ASSISTANCE Description: STG Decreased Risk of Fall With supervision Assistance. Outcome: Progressing   Problem: RH PAIN MANAGEMENT Goal: RH STG PAIN MANAGED AT OR BELOW PT'S PAIN GOAL Description: <4 on a 0-10 pain scale. Outcome: Progressing   Problem: RH KNOWLEDGE DEFICIT SCI Goal: RH STG INCREASE KNOWLEDGE OF SELF CARE AFTER SCI Description: Patient will be able to demonstrate knowledge of medication management, diabetes management, skin/wound  care, weight bearing precautions with educational materials and handouts provided by staff. Outcome: Progressing

## 2020-12-12 NOTE — Progress Notes (Addendum)
Physical Therapy Session Note  Patient Details  Name: Daniel Cline MRN: [id number] Date of Birth: [birth year]  Today's Date: 12/12/2020 PT Individual Time: 1300-1400 PT Individual Time Calculation (min): 60 min   Short Term Goals: Week 1:  PT Short Term Goal 1 (Week 1): Pt will tolerate sitting in appropriate wc x 1 hr to allow for participation in therapy session PT Short Term Goal 1 - Progress (Week 1): Met PT Short Term Goal 2 (Week 1): Pt will roll L/R w/mod assist and cues PT Short Term Goal 2 - Progress (Week 1): Met PT Short Term Goal 3 (Week 1): Pt will tolerate sitting on edge of bed/mat x 10 min w/occasional repositioning if needed and mod assist PT Short Term Goal 3 - Progress (Week 1): Not met Week 2:  PT Short Term Goal 1 (Week 2): pt to demonstrate upright sitting position at least ~75 degrees to increase safety with transfers and sitting tolerance PT Short Term Goal 2 (Week 2): pt to demonstrate supine>sit mod A x1 PT Short Term Goal 3 (Week 2): Pt will tolerate sitting on edge of bed/mat x 10 min w/occasional repositioning if needed and mod assist  Skilled Therapeutic Interventions/Progress Updates:    pt received in bed and initially denying therapy 2/2 nausea and pain. Prior to session, PT chart reviewed pt and per chart review pt denied soft collar delivery to room. Pt agreeable for PT to discuss this. Pt reported he has used soft collar in the past and it was uncomfortable to him and he did not want to try this again. PT educated pt that it may help support and provide some comfort at neck for rolling or transfers to EOB or OOB as pt is currently limited in participation 2/2 pain with all mobility. Pt reported he did not want to try this and "I'm not sitting up today either. I feel too sick and it's too painful." pt reported he has vomited x2 this AM and was nauseous post eating a few bites of lunch from tray, pt denied to attempt any additional lunch and denied need of  drinks. PT educated pt on importance of mobility to attempt to improve pain levels as well, improving functional outcomes and to increase participation with therapy. Pt adamantly refused to attempt sitting EOB, additional helper present to assist in transfer to EOB or OOB to reclined WC pt continuing to adamantly refuse this. NCT also present to attempt to encourage pt and provide assistance if needed but no effect. Pt educated on requirements of CIR scheduling as appropriate and reported, "I will do what I can in the bed." pt directed in upward scooting toward HOB, max A x2 for improved positioning, pt attempting to assist with BLE. Pt then directed in increased increments of HOB elevation from 30 degrees initially>40>45>50<45>50<45<40 with 2 mins-3 mins each according pt's tolerance. Pt reported increased discomfort at final 45 degrees and requested to be lower. VC throughout for relaxing B shoulders and head with this mobility to decrease pain helped slightly. PT asked again to encourage pt to attempt to sit EOB however pt refused. Pt only agreeable to supine BLE strengthening exercises without weighted resistance of leg lifts, heel slides, hip abduction/adduction x15 each. Pt left in supine, All needs in reach and in good condition. Call light in hand.  And alarm set. Pt missed: 15 mins of PT. Pt reported 8/10 pain however denied nursing needs or to request any pain medication.   Therapy Documentation Precautions:  Precautions Precautions: Cervical,Fall Required Braces or Orthoses: Cervical Brace Cervical Brace: Soft collar,For comfort Restrictions Weight Bearing Restrictions: No General: PT Amount of Missed Time (min): 15 Minutes PT Missed Treatment Reason: Pain;Patient unwilling to participate;Patient ill (Comment) (N/V) Vital Signs: Therapy Vitals Temp: 98.1 F (36.7 C) Temp Source: Oral Pulse Rate: 81 Resp: 17 BP: 106/68 Patient Position (if appropriate): Lying Oxygen Therapy SpO2: 97  % O2 Device: Room Air Pain: Pain Assessment Pain Scale: 0-10 Pain Score: 0-No pain Mobility:   Locomotion :    Trunk/Postural Assessment :    Balance:   Exercises:   Other Treatments:      Therapy/Group: Individual Therapy  [name] 12/12/2020, 2:01 PM

## 2020-12-12 NOTE — Progress Notes (Signed)
Orthopedic Tech Progress Note Patient Details:  Daniel Cline April 20, 1956 [id number] Patient did not want soft collar Patient ID: Daniel Cline, male   DOB: [birth year], 65 y.o.   MRN: [id number]   Daniel Cline 12/12/2020, 7:29 AM

## 2020-12-13 ENCOUNTER — Inpatient Hospital Stay (HOSPITAL_COMMUNITY): Payer: Medicare Other

## 2020-12-13 LAB — GLUCOSE, CAPILLARY
Glucose-Capillary: 108 mg/dL — ABNORMAL HIGH (ref 70–99)
Glucose-Capillary: 120 mg/dL — ABNORMAL HIGH (ref 70–99)
Glucose-Capillary: 143 mg/dL — ABNORMAL HIGH (ref 70–99)

## 2020-12-13 IMAGING — DX DG CHEST 2V
2 series · 2 of 2 positions shown · non-contrast
Comparison: Abdominal film from previous day.

CLINICAL DATA: Nausea and vomiting

EXAM:
CHEST - 2 VIEW

[chest lat]
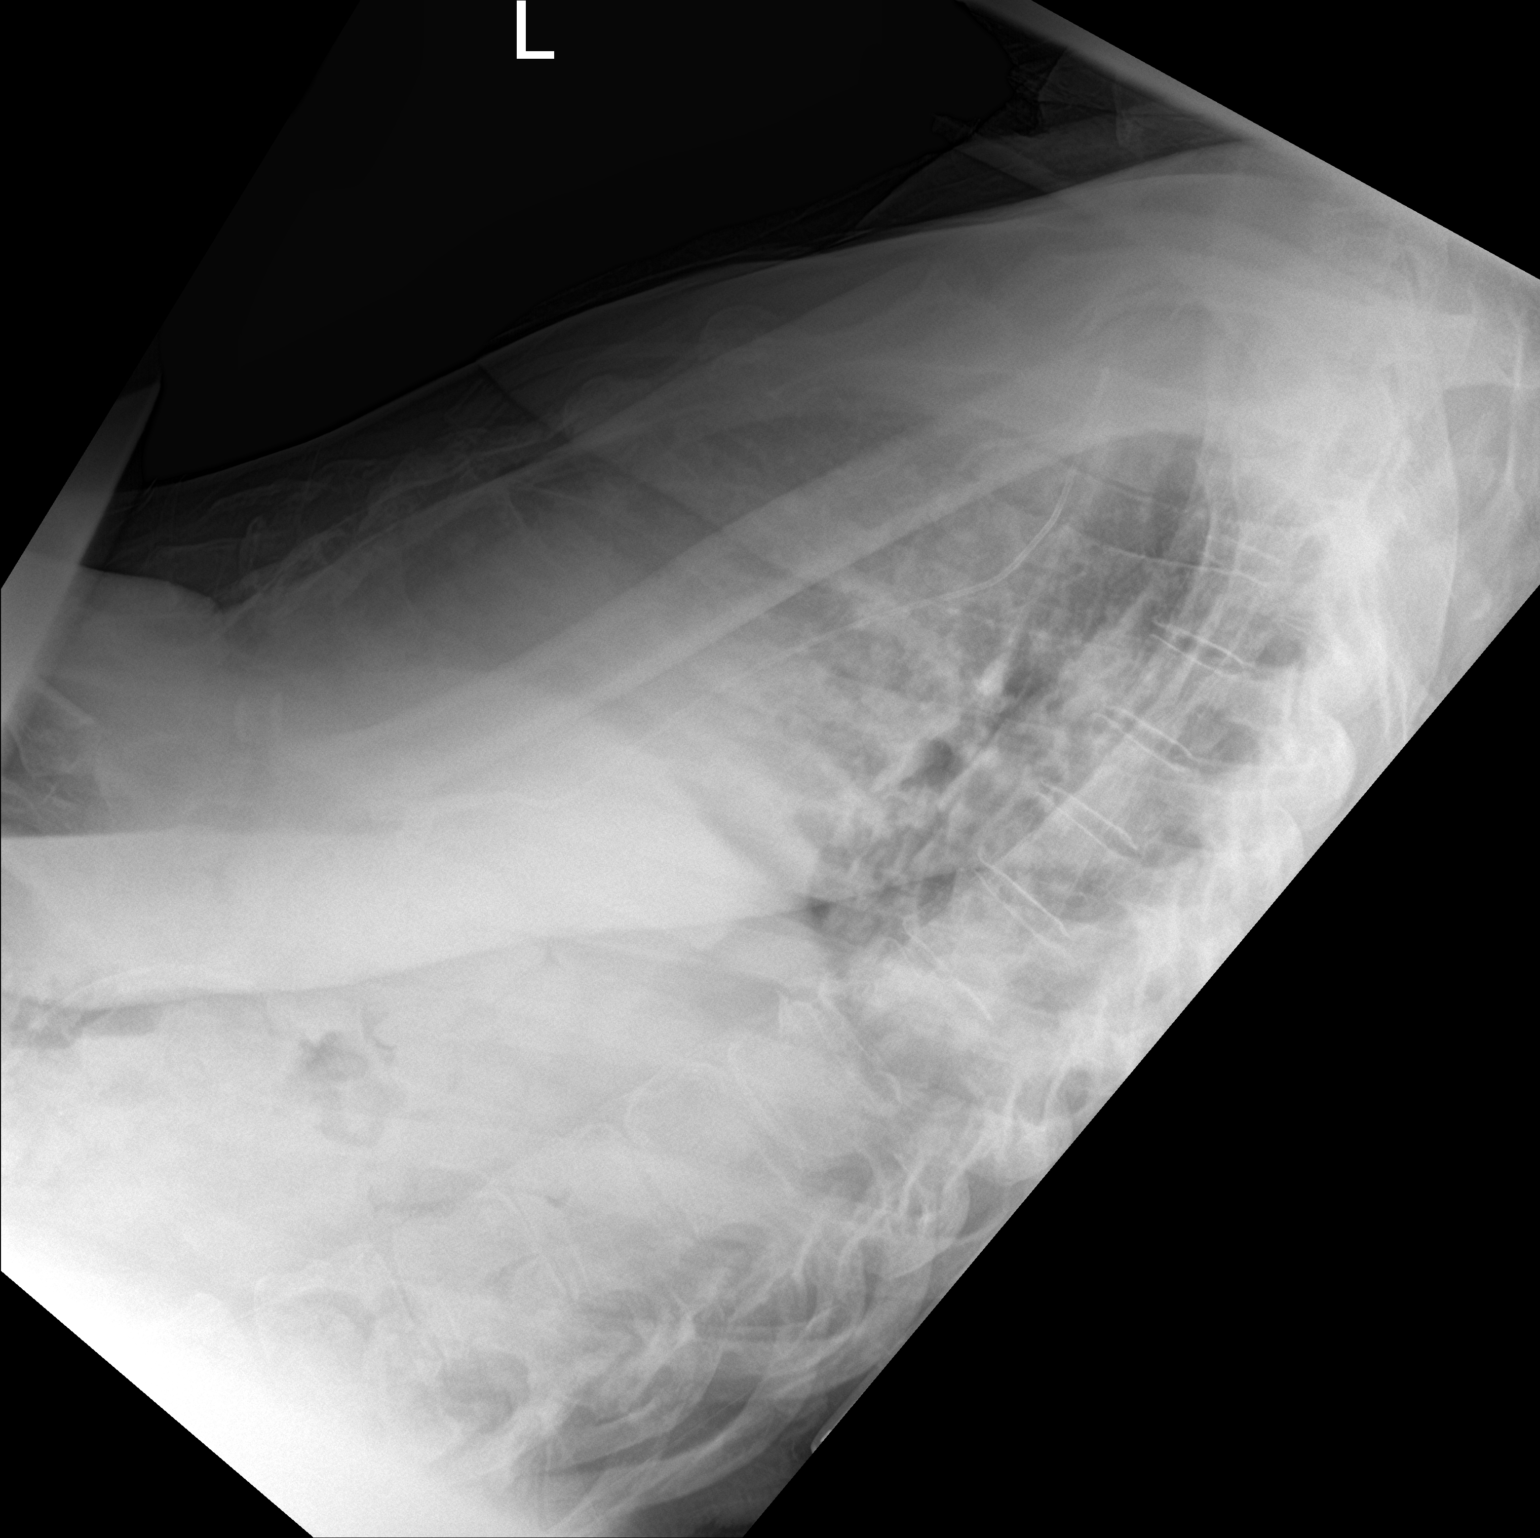

[chest ap]
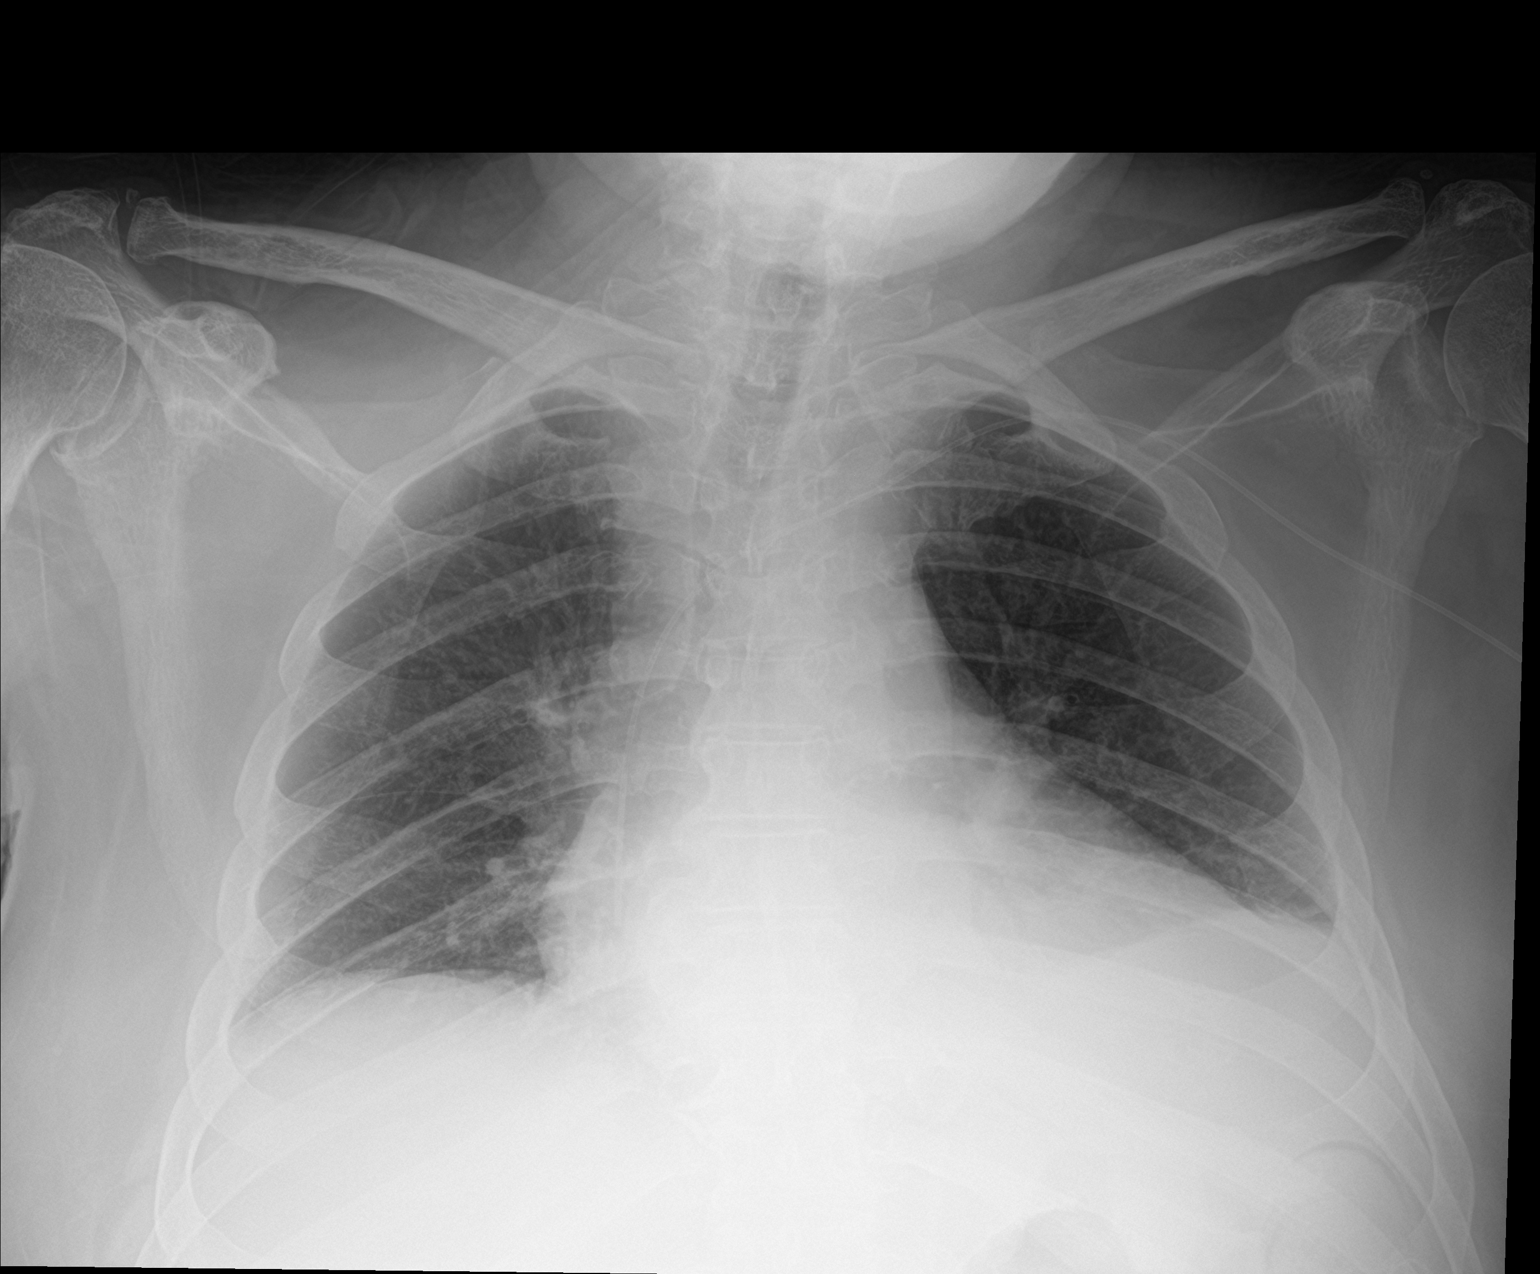

[2 of 2 positions shown; findings below may reference images not displayed]

FINDINGS: Cardiac shadow is within normal limits. Mild left retrocardiac
atelectasis is noted. No other focal infiltrate is seen. No effusion
is noted. No bony abnormality is seen.
IMPRESSION: Stable left basilar atelectasis.

## 2020-12-13 MED ORDER — LISINOPRIL 5 MG PO TABS
5.0000 mg | ORAL_TABLET | Freq: Every day | ORAL | Status: DC
  Administered 2020-12-13 – 2020-12-30 (×18): 5 mg via ORAL
  Filled 2020-12-13 (×18): qty 1

## 2020-12-13 NOTE — Progress Notes (Signed)
PROGRESS NOTE   Subjective/Complaints: Continues to have emesis: RN Josh to give Reglan now. KUB shows left lower lobe atelectasis vs. Infiltrate. Given emesis will order CXR  ROS:   Pt denies SOB, abd pain, CP, and vision changes, +nausea and vomiting    Objective:   DG Abd 1 View  Result Date: 12/12/2020 CLINICAL DATA:  Nausea and vomiting. EXAM: ABDOMEN - 1 VIEW COMPARISON:  12/10/2020 FINDINGS: Bowel gas pattern is nonobstructed. Nondilated loops of large and small bowel are present. There is no evidence for organomegaly. No abnormal calcifications. Visualized osseous structures have a normal appearance. There is minimal LEFT LOWER lobe atelectasis or infiltrate. IMPRESSION: Nonobstructive bowel gas pattern. LEFT LOWER lobe atelectasis or infiltrate. Electronically Signed   By: Nolon Nations M.D.   On: 12/12/2020 13:42   Recent Labs    12/11/20 0447  WBC 9.8  HGB 10.5*  HCT 32.0*  PLT 434*   Recent Labs    12/11/20 0447  NA 129*  K 3.6  CL 98  CO2 25  GLUCOSE 105*  BUN 19  CREATININE 0.72  CALCIUM 8.5*    Intake/Output Summary (Last 24 hours) at 12/13/2020 1511 Last data filed at 12/13/2020 1306 Gross per 24 hour  Intake 300 ml  Output 800 ml  Net -500 ml        Physical Exam: Vital Signs Blood pressure (!) 90/57, pulse 81, temperature (!) 97.3 F (36.3 C), temperature source Oral, resp. rate 16, height _0  (1.702 m), weight 76.3 kg, SpO2 96 %. Gen: no distress, normal appearing HEENT: oral mucosa pink and moist, NCAT Cardio: Reg rate Chest: normal effort, normal rate of breathing Abd: soft, non-distended Ext: no edema Psychiatric: appropriate- more interactive Neurological: Ox3; HOH- no significant change in sensory exam  Musculoskeletal:     Comments: LUE-strength in his Left arm is 4-/5-in the deltoid, bicep, tricep, wrist extension, grip, and finger abduction RUE-deltoid bicep and  tricep are 3/5, wrist extension and grip are 3-/5, LLE-4/5 in hip flexion, knee extension, and dorsiflexion as well as plantarflexion RLE-strength is 4-/5-in the same muscles TTP over L shoulder and L upper trap- muscles feel pretty loose.  Not assessed today Skin:    General: Skin is warm and dry.     Comments:  PICC line in his left upper extremity looks good  Neurological:  He reports his light touch is intact in all 4 extremities He has no lower extremity clonus bilaterally  Assessment/Plan: 1. Functional deficits which require 3+ hours per day of interdisciplinary therapy in a comprehensive inpatient rehab setting.  Physiatrist is providing close team supervision and 24 hour management of active medical problems listed below.  Physiatrist and rehab team continue to assess barriers to discharge/monitor patient progress toward functional and medical goals  Care Tool:  Bathing  Bathing activity did not occur: Safety/medical concerns (Per OT eval on medical hold) Body parts bathed by patient: Chest,Face,Left arm,Abdomen,Left upper leg,Right upper leg   Body parts bathed by helper: Front perineal area,Buttocks,Left upper leg,Right lower leg,Left lower leg,Right arm,Right upper leg     Bathing assist Assist Level: Total Assistance - Patient < 25%     Upper  Body Dressing/Undressing Upper body dressing   What is the patient wearing?: Hospital gown only    Upper body assist Assist Level: Maximal Assistance - Patient 25 - 49%    Lower Body Dressing/Undressing Lower body dressing      What is the patient wearing?: Pants     Lower body assist Assist for lower body dressing: Dependent - Patient 0%     Toileting Toileting    Toileting assist Assist for toileting: Dependent - Patient 0% (per staff  report, declined at eval. also limited by pain) Assistive Device Comment: foley   Transfers Chair/bed transfer  Transfers assist  Chair/bed transfer activity did not occur:  Refused  Chair/bed transfer assist level: Dependent - Patient 0% (3 helpers via sheet)     Locomotion Ambulation   Ambulation assist   Ambulation activity did not occur: Safety/medical concerns          Walk 10 feet activity   Assist  Walk 10 feet activity did not occur: Safety/medical concerns        Walk 50 feet activity   Assist Walk 50 feet with 2 turns activity did not occur: Safety/medical concerns         Walk 150 feet activity   Assist Walk 150 feet activity did not occur: Safety/medical concerns         Walk 10 feet on uneven surface  activity   Assist Walk 10 feet on uneven surfaces activity did not occur: Safety/medical concerns         Wheelchair     Assist Will patient use wheelchair at discharge?: Yes Type of Wheelchair: Manual Wheelchair activity did not occur: Refused  Wheelchair assist level: Dependent - Patient 0%      Wheelchair 50 feet with 2 turns activity    Assist    Wheelchair 50 feet with 2 turns activity did not occur: Refused   Assist Level: Dependent - Patient 0%   Wheelchair 150 feet activity     Assist  Wheelchair 150 feet activity did not occur: Refused   Assist Level: Dependent - Patient 0%   Blood pressure (!) 90/57, pulse 81, temperature (!) 97.3 F (36.3 C), temperature source Oral, resp. rate 16, height _0  (1.702 m), weight 76.3 kg, SpO2 96 %.  Medical Problem List and Plan: 1.  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  Antithrombotics: -DVT/anticoagulation:  Pharmaceutical: Eliquis restarted due to acute DVT in peroneal/soleus             -antiplatelet therapy: N/A 3. Pain Management: Patient is already receiving oxycodone as needed; we  started tramadol scheduled but that is not working; will DC tramadol and schedule MS Contin 15 mg twice daily  - on Valium and Firoicet prn per NSU  D/c cymbalta which may be causing emesis.  4. Mood: - N/A- will see if pt needs to work with Neuropsychology due to uncontrolled pain and new SCI.              -antipsychotic agents: N/A 5. Neuropsych: This patient is capable of making decisions on his own behalf. 6. Skin/Wound Care: We will  change his honeycomb dressing on his neck to daily and as needed; will also change the dressing on his right forearm to just a dry dressing and change as needed. Less drainage today, will not require I&D  4/15- had drainage yesterday, but scant drainage today- almost healed incision- con't to follow/monitor 7. Fluids/Electrolytes/Nutrition: We will check labs on Monday 8. Uncontrolled Diabetes II- A1c of 13.9-continue patient's Levemir 10 units nightly and sliding scale insulin  4/15- BGs running 110s-140s on Levemir 16 units nightly- con't regimen 9. Hypertension hx with Orthostatic hypotension-patient is on HCTZ 25 mg daily as well as Norvasc 10 mg daily and lisinopril 40 mg daily and Lopressor 50 mg twice daily-blood pressure is well controlled we will continue these medications and monitor especially with increased activity  4/15Have reduced Lisinopril, HCTZ, Norvasc and con't Lopressor- still having episode of orthostatic hypotension- might need additional reduction- don't decreased B Blocker due to tachycardia frequently.   4/16: now hypotensive: decrease lisinopril to 46m.  Vitals:   12/13/20 0754 12/13/20 1319  BP: 100/61 (!) 90/57  Pulse: 78 81  Resp:  16  Temp:  (!) 97.3 F (36.3 C)  SpO2:  96%    10.  Neurogenic bowel-we will look to start him on a bowel program nightly after dinner with Dulcolax suppository  4/15- KUB showed diffuse constipation- will give Sorbitol 60 ml to clean him out   12.  Acute DVT in the soleus and peroneal-we will  change his Lovenox to apixaban/Eliquis 10 mg BID x 1 week, then 5 mg BID.  13.  Neurogenic bladder-we will continue his Foley for now and start Flomax tomorrow 0.4 mg q. supper and see if he is able to start to void;  we might need to titrate this as required- will try and remove foley next week.   4/14- still requiring caths- if doesn't have results, soon, will likely need foley to go home with.   4/15- voiding some, but on Max dose of Flomax- might need Foley if going to SNF or family wants it 138 Hypokalemia: supplement 416mBID today and repeat BMP tomorrow  4/8- K+ 3.5- borderline low- will recheck Monday  4/11- K+ 3.3 will replete 40 mEq x2 and recheck Thursday  15. Epidural abscess-  4/5- on  Cefazolin IV for prolonged IV ABX- pt's WBC down to 15.5 (was 14k yesterday, but 20.8 3 days ago)- low grade temps up to 100.2 in last 24 hours-will con't regimen   4/6-White count is down slightly to 14K-we will continue IV antibiotics and check CRP and ESR-weekly  4/7- afebrile- will recheck labs in AM  4/8- WBC stable- con't IV ABX.   4/11- WBC down to 11.3- ESR >140 and CRP 10.9- heading a little better- con't to monitor.   4/15 WBC 9.8, monitor weekly 16. Dysphagia?  D2 diet due to lack of teeth, not due to dysphagia- doesn't need pills crushed. 17. Vomiting: d/c cymbalta which may be contributing. IV zofran. KUB ordered and shows no abdominal pathology. Trial Reglan.  18. Left lower lob infiltrate on KUB (vs atelectasis): will obtain CXR for better visualization.    LOS: 15 days A FACE TO FACE EVALUATION WAS PERFORMED  KrMartha Clan Astin Sayre 12/13/2020, 3:11 PM

## 2020-12-13 NOTE — Plan of Care (Signed)
  Problem: Consults Goal: RH SPINAL CORD INJURY PATIENT EDUCATION Description:  See Patient Education module for education specifics.  Outcome: Progressing Goal: Skin Care Protocol Initiated - if Braden Score 18 or less Description: If consults are not indicated, leave blank or document N/A Outcome: Progressing Goal: Nutrition Consult-if indicated Outcome: Progressing Goal: Diabetes Guidelines if Diabetic/Glucose > 140 Description: If diabetic or lab glucose is > 140 mg/dl - Initiate Diabetes/Hyperglycemia Guidelines & Document Interventions  Outcome: Progressing   Problem: SCI BOWEL ELIMINATION Goal: RH STG MANAGE BOWEL WITH ASSISTANCE Description: STG Manage Bowel with supervision Assistance. Outcome: Progressing   Problem: SCI BLADDER ELIMINATION Goal: RH STG MANAGE BLADDER WITH ASSISTANCE Description: STG Manage Bladder With supervision Assistance Outcome: Progressing   Problem: RH SKIN INTEGRITY Goal: RH STG SKIN FREE OF INFECTION/BREAKDOWN Description: Skin will remain free from infection/breakdown while on rehab. Outcome: Progressing Goal: RH STG ABLE TO PERFORM INCISION/WOUND CARE W/ASSISTANCE Description: STG Able To Perform Incision/Wound Care With supervision Assistance. Outcome: Progressing   Problem: RH SAFETY Goal: RH STG ADHERE TO SAFETY PRECAUTIONS W/ASSISTANCE/DEVICE Description: STG Adhere to Safety Precautions With supervision Assistance/Device. Outcome: Progressing Goal: RH STG DECREASED RISK OF FALL WITH ASSISTANCE Description: STG Decreased Risk of Fall With supervision Assistance. Outcome: Progressing   Problem: RH PAIN MANAGEMENT Goal: RH STG PAIN MANAGED AT OR BELOW PT'S PAIN GOAL Description: <4 on a 0-10 pain scale. Outcome: Progressing   Problem: RH KNOWLEDGE DEFICIT SCI Goal: RH STG INCREASE KNOWLEDGE OF SELF CARE AFTER SCI Description: Patient will be able to demonstrate knowledge of medication management, diabetes management, skin/wound  care, weight bearing precautions with educational materials and handouts provided by staff. Outcome: Progressing   

## 2020-12-13 NOTE — Progress Notes (Signed)
Patient still eating at end of shift. Son has brought in something for patient to eat. Next shift to start bowel program. Cletis Media, LPN

## 2020-12-13 NOTE — Progress Notes (Signed)
Physical Therapy Session Note  Patient Details  Name: Daniel Cline MRN: [id number] Date of Birth: [birth year]  Today's Date: 12/13/2020 PT Individual Time: 8563-1497; 0263-7858 PT Individual Time Calculation (min): 40 min and 35 min PT Missed Time: 10 min Missed Time Reason: patient illness (nausea)  Short Term Goals: Week 2:  PT Short Term Goal 1 (Week 2): pt to demonstrate upright sitting position at least ~75 degrees to increase safety with transfers and sitting tolerance PT Short Term Goal 2 (Week 2): pt to demonstrate supine>sit mod A x1 PT Short Term Goal 3 (Week 2): Pt will tolerate sitting on edge of bed/mat x 10 min w/occasional repositioning if needed and mod assist  Skilled Therapeutic Interventions/Progress Updates:    Session 1: Pt received seated in bed, agreeable to PT session. Pt reports ongoing nausea. Pt encouraged to sit up to EOB for improved upright tolerance, pt declines at this time due to ongoing nausea and fear of pain with upright sitting. Pt agreeable to bed level exercises. Pt able to don pants with total A with mod A for rolling R/L with use of bedrails. Supine BLE strengthening therex, 2 x 10 reps: heel slides, hip abd, SLR, SKFO, hip add squeeze, bridges. Pt is total A to scoot towards [facility] for repositioning. Pt encouraged to take pain medicine prior to PM session so that he can attempt to sit up to EOB for PM session. Pt reports due to nausea he does not want pain medication. Pt left semi-reclined in bed with needs in reach at end of session, bed alarm in place.  Session 2: Pt received seated in bed, agreeable to PT session. Pt reports ongoing nausea, declines to attempt to sit EOB this session. Pt agreeable to perform UB strengthening therex. Attempt to have pt utilize 1# dowel rod for exercise, unable to due to L shoulder ROM and strength limitations. L shoulder flexion AAROM with use of towel on slide board, 3 x 5 reps to fatigue. Pt also reports soreness  in his buttocks from laying on his back in bed, reviewed turning schedule and importance of time spent in sidelying to prevent pressure sores on sacrum. Pt is max A to roll onto L side, placed pillow behind him in order to maintain positioning. Pt left in L sidelying in bed with needs in reach. Pt missed 10 min of scheduled therapy session due to ongoing nausea.  Therapy Documentation Precautions:  Precautions Precautions: Cervical,Fall Required Braces or Orthoses: Cervical Brace Cervical Brace: Soft collar,For comfort Restrictions Weight Bearing Restrictions: No     Therapy/Group: Individual Therapy   Peter Congo, PT, DPT, CSRS  12/13/2020, 12:11 PM

## 2020-12-13 NOTE — Progress Notes (Signed)
Occupational Therapy Session Note  Patient Details  Name: Daniel Cline MRN: [id number] Date of Birth: 01-03-1956  Today's Date: 12/13/2020 OT Individual Time: 0900-0930 OT Individual Time Calculation (min): 30 min  and Today's Date: 12/13/2020 OT Missed Time: 30 Minutes Missed Time Reason: Pain;Other (comment)   Short Term Goals: Week 2:  OT Short Term Goal 1 (Week 2): Pt will improve R hand grip strength to 20# to perform with self feeding/grooming with Supervision OT Short Term Goal 2 (Week 2): Pt will transition supine > sit w/ Max of 1 in prep for ADL OT Short Term Goal 3 (Week 2): Pt will roll L/R with Max A of 1 to assist with bed level ADL OT Short Term Goal 4 (Week 2): Pt will tolerate HOB >50* elevation for 5+ minutes during IADL/ADL OT Short Term Goal 5 (Week 2): Pt will thread pants bed level with reacher Mod A  Skilled Therapeutic Interventions/Progress Updates:    Pt resting in bed upon arrival. Pt stated he was having a "rough morning" and declined OOB/EOB activities. Pt agreeable to bed level activities. Pt c/o ongoing nausea but no vomiting this morning. Attempted to engage pt in BUE threx with 1# bar but pt unable to engaged LUE in task. Pt c/o increased pain with LUE tasks against gravity. Pt with considerable LUE/shoulder strength. Pt engaged in scapula protraction/retraction tasks with LUE supported on board at 30 degree incline. Pt able to perform 5 slides x 4 with increased pain as activity progressed. Pt became tearful and stated he needed to rest. Emotional support provided.  Therapy Documentation Precautions:  Precautions Precautions: Cervical,Fall Required Braces or Orthoses: Cervical Brace Cervical Brace: Soft collar,For comfort Restrictions Weight Bearing Restrictions: No General: General OT Amount of Missed Time: 30 Minutes Pain:  Pt grimaces with Lt shoulder AROM/PROM   Therapy/Group: Individual Therapy  Rich Brave 12/13/2020,  9:47 AM

## 2020-12-14 LAB — GLUCOSE, CAPILLARY
Glucose-Capillary: 150 mg/dL — ABNORMAL HIGH (ref 70–99)
Glucose-Capillary: 153 mg/dL — ABNORMAL HIGH (ref 70–99)
Glucose-Capillary: 115 mg/dL — ABNORMAL HIGH (ref 70–99)
Glucose-Capillary: 120 mg/dL — ABNORMAL HIGH (ref 70–99)
Glucose-Capillary: 131 mg/dL — ABNORMAL HIGH (ref 70–99)

## 2020-12-14 LAB — CBC WITH DIFFERENTIAL/PLATELET
Abs Immature Granulocytes: 0.06 10*3/uL (ref 0.00–0.07)
Basophils Absolute: 0.1 10*3/uL (ref 0.0–0.1)
Basophils Relative: 1 %
Eosinophils Absolute: 0.3 10*3/uL (ref 0.0–0.5)
Eosinophils Relative: 3 %
HCT: 31.9 % — ABNORMAL LOW (ref 39.0–52.0)
Hemoglobin: 10.3 g/dL — ABNORMAL LOW (ref 13.0–17.0)
Immature Granulocytes: 1 %
Lymphocytes Relative: 24 %
Lymphs Abs: 2.2 10*3/uL (ref 0.7–4.0)
MCH: 30.6 pg (ref 26.0–34.0)
MCHC: 32.3 g/dL (ref 30.0–36.0)
MCV: 94.7 fL (ref 80.0–100.0)
Monocytes Absolute: 1 10*3/uL (ref 0.1–1.0)
Monocytes Relative: 11 %
Neutro Abs: 5.6 10*3/uL (ref 1.7–7.7)
Neutrophils Relative %: 60 %
Platelets: 442 10*3/uL — ABNORMAL HIGH (ref 150–400)
RBC: 3.37 MIL/uL — ABNORMAL LOW (ref 4.22–5.81)
RDW: 13.7 % (ref 11.5–15.5)
WBC: 9.3 10*3/uL (ref 4.0–10.5)
nRBC: 0 % (ref 0.0–0.2)

## 2020-12-14 LAB — COMPREHENSIVE METABOLIC PANEL
ALT: 8 U/L (ref 0–44)
AST: 16 U/L (ref 15–41)
Albumin: 2.3 g/dL — ABNORMAL LOW (ref 3.5–5.0)
Alkaline Phosphatase: 128 U/L — ABNORMAL HIGH (ref 38–126)
Anion gap: 7 (ref 5–15)
BUN: 24 mg/dL — ABNORMAL HIGH (ref 8–23)
CO2: 27 mmol/L (ref 22–32)
Calcium: 8.6 mg/dL — ABNORMAL LOW (ref 8.9–10.3)
Chloride: 96 mmol/L — ABNORMAL LOW (ref 98–111)
Creatinine, Ser: 0.83 mg/dL (ref 0.61–1.24)
GFR, Estimated: >60 mL/min (ref >60)
Glucose, Bld: 158 mg/dL — ABNORMAL HIGH (ref 70–99)
Potassium: 3.6 mmol/L (ref 3.5–5.1)
Sodium: 130 mmol/L — ABNORMAL LOW (ref 135–145)
Total Bilirubin: 0.5 mg/dL (ref 0.3–1.2)
Total Protein: 6.8 g/dL (ref 6.5–8.1)

## 2020-12-14 LAB — SEDIMENTATION RATE: Sed Rate: 138 mm/hr — ABNORMAL HIGH (ref 0–16)

## 2020-12-14 LAB — C-REACTIVE PROTEIN: CRP: 3.3 mg/dL — ABNORMAL HIGH (ref <1.0)

## 2020-12-14 MED ORDER — ENSURE ENLIVE PO LIQD
237.0000 mL | Freq: Three times a day (TID) | ORAL | Status: DC
  Administered 2020-12-14 – 2020-12-17 (×8): 237 mL via ORAL

## 2020-12-14 MED ORDER — ADULT MULTIVITAMIN W/MINERALS CH
1.0000 | ORAL_TABLET | Freq: Every day | ORAL | Status: DC
  Administered 2020-12-15 – 2021-01-01 (×18): 1 via ORAL
  Filled 2020-12-14 (×19): qty 1

## 2020-12-14 NOTE — Progress Notes (Signed)
Pt stated that he was very nauseous this am, and that he could not eat much of breakfast. He did eat a banana and drank few sips of glucerna, nausea medication administered.

## 2020-12-14 NOTE — Progress Notes (Signed)
Occupational Therapy Session Note  Patient Details  Name: Daniel Cline MRN: [id number] Date of Birth: Jul 19, 1956  Today's Date: 12/14/2020 OT Individual Time: 3149-7026 OT Individual Time Calculation (min): 51 min  9 minutes missed  Skilled Therapeutic Interventions/Progress Updates:    Pt greeted in bed, appearing ill. Pt reported nausea, HA. Head cool to the touch and a bit clammy. Notified RN who remained with [place] for about half of the session. Pt ate his banana with setup assist and significantly increased time to meet motor demands of task unassisted. Kept the emesis bag close at this time but pt with no emesis during tx. Pt declined getting OOB due to feeling unwell, agreeable to listen to some music and do light bedlevel exercises to cheer him up. He requested several songs for OT to play and his affected brightened considerably while listening to these meaningful songs via speaker. Pt tolerating gentle ROM of the Lt elbow/hand with no s/s pain. Pt actively flexing his Rt shoulder and completing IR/ER ROM while "dancing" with RT. Pt also tapping his Rt foot in bed. RN assessed BP while pt took a break from participation, 80s/60s. Before OT departure BP reassessed with reading of 96/59. Time missed due to fatigue/illness/low BP. +2 for boosting up in bed. Pt remained in bed at close of session, all needs within reach and bed alarm set. Tx focus placed on psychosocial wellbeing and general strengthening/activity tolerance.  Pt also provided with an anti-nausea aromatherapy blend with pt consent at start of session. He reported improvement in nausea before OT left his room  Therapy Documentation Precautions:  Precautions Precautions: Cervical,Fall Required Braces or Orthoses: Cervical Brace Cervical Brace: Soft collar,For comfort Restrictions Weight Bearing Restrictions: No Vital Signs: Therapy Vitals Pulse Rate: 62 BP: 112/75 Pain: HA discomfort, RN provided medicine during  tx Pain Assessment Pain Scale: 0-10 Pain Score: 0-No pain ADL: ADL Eating: Not assessed Grooming: Not assessed Upper Body Bathing: Maximal assistance Where Assessed-Upper Body Bathing: Bed level Lower Body Bathing: Dependent Where Assessed-Lower Body Bathing: Bed level Upper Body Dressing: Dependent Lower Body Dressing: Dependent Where Assessed-Lower Body Dressing: Bed level Toileting: Not assessed Toilet Transfer: Not assessed Tub/Shower Transfer: Not assessed Walk-In Shower Transfer: Not assessed      Therapy/Group: Individual Therapy  [name] A [name] 12/14/2020, 12:26 PM

## 2020-12-14 NOTE — Progress Notes (Signed)
Bowel Program  Started at 2030. Dig stim 15 sec. X2.   Repeated 30 mins after  With no stool removed  From rectum.  Patient had a medium bowel movement  Later on at 2224. Pt. Tolerated well.

## 2020-12-14 NOTE — Progress Notes (Signed)
Physical Therapy Session Note  Patient Details  Name: Daniel Cline MRN: [id number] Date of Birth: [birth year]  Today's Date: 12/14/2020 PT Individual Time: 1300-1330 PT Individual Time Calculation (min): 30 min   Short Term Goals: Week 1:  PT Short Term Goal 1 (Week 1): Pt will tolerate sitting in appropriate wc x 1 hr to allow for participation in therapy session PT Short Term Goal 1 - Progress (Week 1): Met PT Short Term Goal 2 (Week 1): Pt will roll L/R w/mod assist and cues PT Short Term Goal 2 - Progress (Week 1): Met PT Short Term Goal 3 (Week 1): Pt will tolerate sitting on edge of bed/mat x 10 min w/occasional repositioning if needed and mod assist PT Short Term Goal 3 - Progress (Week 1): Not met Week 2:  PT Short Term Goal 1 (Week 2): pt to demonstrate upright sitting position at least ~75 degrees to increase safety with transfers and sitting tolerance PT Short Term Goal 2 (Week 2): pt to demonstrate supine>sit mod A x1 PT Short Term Goal 3 (Week 2): Pt will tolerate sitting on edge of bed/mat x 10 min w/occasional repositioning if needed and mod assist     Skilled Therapeutic Interventions/Progress Updates:    pt received in bed and agreeable to therapy, pt did not rank pain but reported pain with all mobility. Pt directed in scooting upward in bed, mod A with bed in boost assist positioning for increased participation with pt actively. Pt then returned to slightly upright positioning in bed. PT attempted to encourage pt to sit up at EOB, pt refused multiple times. Pt educated on importance of this and pt reported, "I don't know why I'm having so much pain but I am not doing it unless it's better." pt only agreeable to bed level exercises at this point in therapy. Pt given additional education on importance of progressing with PT but pt refused. HOB increased to 45 degrees, 50 degrees, 45 degrees with pt reporting unable to tolerate 50 degrees 2/2 pain. Pt directed in BLE 2x15  heel slides, hip abduction and adduction. Pt left in bed, All needs in reach and in good condition. Call light in hand.  Alarm set.   Therapy Documentation Precautions:  Precautions Precautions: Cervical,Fall Required Braces or Orthoses: Cervical Brace Cervical Brace: Soft collar,For comfort Restrictions Weight Bearing Restrictions: No General: PT Amount of Missed Time (min): 60 Minutes PT Missed Treatment Reason: Patient fatigue;Patient ill (Comment) (nauseous) Vital Signs:   Pain: Pain Assessment Pain Scale: 0-10 Pain Score: 5  Mobility:   Locomotion :    Trunk/Postural Assessment :    Balance:   Exercises:   Other Treatments:      Therapy/Group: Individual Therapy  [name] 12/14/2020, 3:51 PM

## 2020-12-14 NOTE — Progress Notes (Signed)
Initial Nutrition Assessment  DOCUMENTATION CODES:   Not applicable  INTERVENTION:   - If PO intake does not improve on a consistent basis, recommend Cortrak placement and initiation of enteral nutrition.  - Increase Ensure Enlive po to TID, each supplement provides 350 kcal and 20 grams of protein  - Add Magic Cup TID with meals, each supplement provides 290 kcal and 9 grams of protein  - Family to continue to bring in food from home/restaurants  - MVI with minerals daily  NUTRITION DIAGNOSIS:   Inadequate oral intake related to nausea as evidenced by meal completion < 50%.  GOAL:   Patient will meet greater than or equal to 90% of their needs  MONITOR:   PO intake,Supplement acceptance,Labs,Weight trends,Skin  REASON FOR ASSESSMENT:   Malnutrition Screening Tool    ASSESSMENT:   65 year old male PMH of uncontrolled T2DM, HTN. Pt was found to have a cervical spine epidural abscess and was taken emergently to the OR and had a laminectomy and abscess evacuation. Pt also was found to have a right forearm abscess and is s/p I&D. 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 Admitted to CIR on 4/03.   RD was unable to reach pt via phone call to room. Per notes, pt very nauseous and lethargic this AM. Discussed pt with RN. Per RN, pt only ate banana and drank a few sips of Ensure this morning for breakfast. RN reports that pt's son brought him a sausage, egg, and cheese biscuit for lunch. Pt's son told RN that pt was able to eat the whole biscuit.  Reviewed weight history in chart. If weaights are accurate, pt has experienced a 21.3 kg weight loss in less than 1 month. This is a 21.8% weight loss which is severe and significant for timeframe. Pt is at risk for malnutrition and may already meet criteria but RD unable to confirm without NFPE.  RD will add additional oral nutrition supplements and monitor PO intake. If PO intake does not improve on  a consistent basis, recommend placement of Cortrak tube and initiation of enteral nutrition.  Meal Completion: 0-50%  Medications reviewed and include: dulcolax, Ensure Enlive BID, ferrous sulfate, SSI, levemir 16 units daily, miralax, senna, IV abx  Labs reviewed: sodium 130, BUN 24 CBG's: 115-153 x 24 hours  NUTRITION - FOCUSED PHYSICAL EXAM:  Unable to complete at this time RD working remotely.  Diet Order:   Diet Order            DIET DYS 2 Room service appropriate? Yes; Fluid consistency: Thin  Diet effective now           Diet - low sodium heart healthy                 EDUCATION NEEDS:   Not appropriate for education at this time  Skin:  Skin Assessment:  Skin Integrity Issues: Incisions: cervical, R arm Other: MASD to groin  Last BM:  12/13/20 medium type 5  Height:   Ht Readings from Last 1 Encounters:  11/28/20 5\' 7"  (1.702 m)    Weight:   Wt Readings from Last 1 Encounters:  12/12/20 76.3 kg    BMI:  Body mass index is 26.35 kg/m.  Estimated Nutritional Needs:   Kcal:  1850-2050  Protein:  85-100 grams  Fluid:  1.8-2.0 L    12/14/20, MS, RD, LDN Inpatient Clinical Dietitian Please see [facility] for contact information.

## 2020-12-14 NOTE — Progress Notes (Signed)
PROGRESS NOTE   Subjective/Complaints: Continues to have nausea, d/c iron in case contributing Family brought in burger and he has tolerated so far Nausea improves with zofran Discussed results of XR with family  ROS:   Pt denies SOB, abd pain, CP, and vision changes, +nausea and vomiting    Objective:   DG Chest 2 View  Result Date: 12/13/2020 CLINICAL DATA:  Nausea and vomiting EXAM: CHEST - 2 VIEW COMPARISON:  Abdominal film from previous day. FINDINGS: Cardiac shadow is within normal limits. Mild left retrocardiac atelectasis is noted. No other focal infiltrate is seen. No effusion is noted. No bony abnormality is seen. IMPRESSION: Stable left basilar atelectasis. Electronically Signed   By: Inez Catalina M.D.   On: 12/13/2020 16:29   Recent Labs    12/14/20 0504  WBC 9.3  HGB 10.3*  HCT 31.9*  PLT 442*   Recent Labs    12/14/20 0504  NA 130*  K 3.6  CL 96*  CO2 27  GLUCOSE 158*  BUN 24*  CREATININE 0.83  CALCIUM 8.6*    Intake/Output Summary (Last 24 hours) at 12/14/2020 1735 Last data filed at 12/14/2020 1305 Gross per 24 hour  Intake 267 ml  Output 300 ml  Net -33 ml        Physical Exam: Vital Signs Blood pressure 112/75, pulse 62, temperature 98.3 F (36.8 C), resp. rate 16, height '5\' 7"'  (1.702 m), weight 76.3 kg, SpO2 94 %. Gen: no distress, normal appearing HEENT: oral mucosa pink and moist, NCAT Cardio: Reg rate Chest: normal effort, normal rate of breathing Abd: soft, non-distended Ext: no edema Psychiatric: appropriate- more interactive Neurological: Ox3; HOH- no significant change in sensory exam  Musculoskeletal:     Comments: LUE-strength in his Left arm is 4-/5-in the deltoid, bicep, tricep, wrist extension, grip, and finger abduction RUE-deltoid bicep and tricep are 3/5, wrist extension and grip are 3-/5, LLE-4/5 in hip flexion, knee extension, and dorsiflexion as well as  plantarflexion RLE-strength is 4-/5-in the same muscles TTP over L shoulder and L upper trap- muscles feel pretty loose.  Not assessed today Skin:    General: Skin is warm and dry.     Comments:  PICC line in his left upper extremity looks good  Neurological:  He reports his light touch is intact in all 4 extremities He has no lower extremity clonus bilaterally  Assessment/Plan: 1. Functional deficits which require 3+ hours per day of interdisciplinary therapy in a comprehensive inpatient rehab setting.  Physiatrist is providing close team supervision and 24 hour management of active medical problems listed below.  Physiatrist and rehab team continue to assess barriers to discharge/monitor patient progress toward functional and medical goals  Care Tool:  Bathing  Bathing activity did not occur: Safety/medical concerns (Per OT eval on medical hold) Body parts bathed by patient: Chest,Face,Left arm,Abdomen,Left upper leg,Right upper leg   Body parts bathed by helper: Front perineal area,Buttocks,Left upper leg,Right lower leg,Left lower leg,Right arm,Right upper leg     Bathing assist Assist Level: Total Assistance - Patient < 25%     Upper Body Dressing/Undressing Upper body dressing   What is the patient wearing?: Hospital  gown only    Upper body assist Assist Level: Maximal Assistance - Patient 25 - 49%    Lower Body Dressing/Undressing Lower body dressing      What is the patient wearing?: Pants     Lower body assist Assist for lower body dressing: Dependent - Patient 0%     Toileting Toileting    Toileting assist Assist for toileting: Dependent - Patient 0% (per staff  report, declined at eval. also limited by pain) Assistive Device Comment: foley   Transfers Chair/bed transfer  Transfers assist  Chair/bed transfer activity did not occur: Refused  Chair/bed transfer assist level: Dependent - Patient 0% (3 helpers via sheet)      Locomotion Ambulation   Ambulation assist   Ambulation activity did not occur: Safety/medical concerns          Walk 10 feet activity   Assist  Walk 10 feet activity did not occur: Safety/medical concerns        Walk 50 feet activity   Assist Walk 50 feet with 2 turns activity did not occur: Safety/medical concerns         Walk 150 feet activity   Assist Walk 150 feet activity did not occur: Safety/medical concerns         Walk 10 feet on uneven surface  activity   Assist Walk 10 feet on uneven surfaces activity did not occur: Safety/medical concerns         Wheelchair     Assist Will patient use wheelchair at discharge?: Yes Type of Wheelchair: Manual Wheelchair activity did not occur: Refused  Wheelchair assist level: Dependent - Patient 0%      Wheelchair 50 feet with 2 turns activity    Assist    Wheelchair 50 feet with 2 turns activity did not occur: Refused   Assist Level: Dependent - Patient 0%   Wheelchair 150 feet activity     Assist  Wheelchair 150 feet activity did not occur: Refused   Assist Level: Dependent - Patient 0%   Blood pressure 112/75, pulse 62, temperature 98.3 F (36.8 C), resp. rate 16, height '5\' 7"'  (1.702 m), weight 76.3 kg, SpO2 94 %.  Medical Problem List and Plan: 1.  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  Antithrombotics: -DVT/anticoagulation:  Pharmaceutical: Eliquis restarted due to acute DVT in peroneal/soleus             -antiplatelet therapy: N/A 3. Pain Management: Patient is already receiving oxycodone as needed; we started tramadol scheduled but that is not working; will DC tramadol and schedule MS Contin 15 mg twice daily  - on Valium  and Firoicet prn per NSU  D/c cymbalta which may be causing emesis.   D/c MS contin given lethargy and nausea preventing participation with therapy. Discussed with family.   -decrease MS 4. Mood: - N/A- will see if pt needs to work with Neuropsychology due to uncontrolled pain and new SCI.              -antipsychotic agents: N/A 5. Neuropsych: This patient is capable of making decisions on his own behalf. 6. Skin/Wound Care: We  will change his honeycomb dressing on his neck to daily and as needed; will also change the dressing on his right forearm to just a dry dressing and change as needed. Less drainage today, will not require I&D  4/15- had drainage yesterday, but scant drainage today- almost healed incision- con't to follow/monitor 7. Fluids/Electrolytes/Nutrition: We will check labs on Monday 8. Uncontrolled Diabetes II- A1c of 13.9-continue patient's Levemir 10 units nightly and sliding scale insulin  4/15- BGs running 110s-140s on Levemir 16 units nightly- con't regimen 9. Hypertension hx with Orthostatic hypotension-patient is on HCTZ 25 mg daily as well as Norvasc 10 mg daily and lisinopril 40 mg daily and Lopressor 50 mg twice daily-blood pressure is well controlled we will continue these medications and monitor especially with increased activity  4/15Have reduced Lisinopril, HCTZ, Norvasc and con't Lopressor- still having episode of orthostatic hypotension- might need additional reduction- don't decreased B Blocker due to tachycardia frequently.   4/16: now hypotensive: decrease lisinopril to 61m.  Vitals:   12/14/20 0527 12/14/20 0913  BP: 106/68 112/75  Pulse: 83 62  Resp: 16   Temp: 98.3 F (36.8 C)   SpO2: 94%     10.  Neurogenic bowel-we will look to start him on a bowel program nightly after dinner with Dulcolax suppository  4/15- KUB showed diffuse constipation- will give Sorbitol 60 ml to clean him out   12.  Acute DVT in the soleus and peroneal-we will change his  Lovenox to apixaban/Eliquis 10 mg BID x 1 week, then 5 mg BID.  13.  Neurogenic bladder-we will continue his Foley for now and start Flomax tomorrow 0.4 mg q. supper and see if he is able to start to void;  we might need to titrate this as required- will try and remove foley next week.   4/14- still requiring caths- if doesn't have results, soon, will likely need foley to go home with.   4/15- voiding some, but on Max dose of Flomax- might need Foley if going to SNF or family wants it 185 Hypokalemia: supplement 414mBID today and repeat BMP tomorrow  4/8- K+ 3.5- borderline low- will recheck Monday  4/11- K+ 3.3 will replete 40 mEq x2 and recheck Thursday  15. Epidural abscess-  4/5- on  Cefazolin IV for prolonged IV ABX- pt's WBC down to 15.5 (was 14k yesterday, but 20.8 3 days ago)- low grade temps up to 100.2 in last 24 hours-will con't regimen   4/6-White count is down slightly to 14K-we will continue IV antibiotics and check CRP and ESR-weekly  4/7- afebrile- will recheck labs in AM  4/8- WBC stable- con't IV ABX.   4/11- WBC down to 11.3- ESR >140 and CRP 10.9- heading a little better- con't to monitor.   4/15 WBC 9.8, monitor weekly 16. Dysphagia?  D2 diet due to lack of teeth, not due to dysphagia- doesn't need pills crushed. 17. Vomiting: d/c cymbalta which may be contributing. IV zofran. KUB ordered and shows no abdominal pathology. Trial Reglan. D/c Iron.  18. Atelectasis: Recommend incentive spirometer to improve breathing strength and ability over time. Provided incentive spirometer education.   LOS: 16 days A FACE TO FACE EVALUATION WAS PERFORMED  KrClide Deutscheraulkar 12/14/2020, 5:35 PM

## 2020-12-14 NOTE — Progress Notes (Signed)
Physical Therapy Session Note  Patient Details  Name: Daniel Cline MRN: [id number] Date of Birth: February 05, 1956  Today's Date: 12/14/2020 PT Amount of Missed Time (min): 60 Minutes PT Missed Treatment Reason: Patient fatigue;Patient ill (Comment) (nauseous)  Short Term Goals: Week 2:  PT Short Term Goal 1 (Week 2): pt to demonstrate upright sitting position at least ~75 degrees to increase safety with transfers and sitting tolerance PT Short Term Goal 2 (Week 2): pt to demonstrate supine>sit mod A x1 PT Short Term Goal 3 (Week 2): Pt will tolerate sitting on edge of bed/mat x 10 min w/occasional repositioning if needed and mod assist  Skilled Therapeutic Interventions/Progress Updates:    Attempted to see patient for scheduled therapy session. Pt reports ongoing nausea and appears very lethargic, hardly able to keep eyes open to have conversation with this therapist. Pt missed 60 min of scheduled therapy session due to inability to functionally participate due to ongoing nausea and lethargy this date.  Therapy Documentation Precautions:  Precautions Precautions: Cervical,Fall Required Braces or Orthoses: Cervical Brace Cervical Brace: Soft collar,For comfort Restrictions Weight Bearing Restrictions: No    Therapy/Group: Individual Therapy   Peter Congo, PT, DPT, CSRS  12/14/2020, 12:06 PM

## 2020-12-15 LAB — GLUCOSE, CAPILLARY
Glucose-Capillary: 154 mg/dL — ABNORMAL HIGH (ref 70–99)
Glucose-Capillary: 131 mg/dL — ABNORMAL HIGH (ref 70–99)
Glucose-Capillary: 121 mg/dL — ABNORMAL HIGH (ref 70–99)
Glucose-Capillary: 127 mg/dL — ABNORMAL HIGH (ref 70–99)

## 2020-12-15 MED ORDER — MORPHINE SULFATE ER 15 MG PO TBCR
15.0000 mg | EXTENDED_RELEASE_TABLET | Freq: Two times a day (BID) | ORAL | Status: DC
  Administered 2020-12-15 – 2020-12-29 (×28): 15 mg via ORAL
  Filled 2020-12-15 (×28): qty 1

## 2020-12-15 MED ORDER — DIAZEPAM 2 MG PO TABS
2.0000 mg | ORAL_TABLET | Freq: Two times a day (BID) | ORAL | Status: DC
  Administered 2020-12-15 – 2020-12-18 (×6): 2 mg via ORAL
  Filled 2020-12-15 (×6): qty 1

## 2020-12-15 NOTE — Progress Notes (Signed)
Dig stemmed patient, medium sized brown stool. Rectal vault clear.

## 2020-12-15 NOTE — Progress Notes (Signed)
Occupational Therapy Session Note  Patient Details  Name: Daniel Cline MRN: [id number] Date of Birth: 11/17/1955  Today's Date: 12/15/2020 OT Individual Time: 7342-8768 OT Individual Time Calculation (min): 56 min    Short Term Goals: Week 1:  OT Short Term Goal 1 (Week 1): Pt will transition supine > sit Max A of 1 in prep for seated ADL OT Short Term Goal 1 - Progress (Week 1): Not met OT Short Term Goal 2 (Week 1): Pt will perform seated EOB/EOM activity or ADL for 5 minutes OT Short Term Goal 2 - Progress (Week 1): Not met OT Short Term Goal 3 (Week 1): Pt will improve R grip strength to 20# to improve self feeding/grooming OT Short Term Goal 3 - Progress (Week 1): Progressing toward goal OT Short Term Goal 4 (Week 1): Pt will perform UB dress with Mod A of 1 OT Short Term Goal 4 - Progress (Week 1): Not met Week 2:  OT Short Term Goal 1 (Week 2): Pt will improve R hand grip strength to 20# to perform with self feeding/grooming with Supervision OT Short Term Goal 2 (Week 2): Pt will transition supine > sit w/ Max of 1 in prep for ADL OT Short Term Goal 3 (Week 2): Pt will roll L/R with Max A of 1 to assist with bed level ADL OT Short Term Goal 4 (Week 2): Pt will tolerate HOB >50* elevation for 5+ minutes during IADL/ADL OT Short Term Goal 5 (Week 2): Pt will thread pants bed level with reacher Mod A   Skilled Therapeutic Interventions/Progress Updates:    Pt greeted at time of session supine in bed with nursing staff finishing bladder scan, OT to resume care for LB dressing after completed. Rolling L/R with Max A with cues for hand placement and sequencing. Total A to pull shorts back over hips bed level. Pt performing task of bringing handled cup to mouth with hand over hand and Min A distal support. Focus on bed mobility to participate in seated ADL, supine <> sit 2 helpers able to tolerate side sitting EOB <5 seconds d/t neck and shoulder pain and returned to supine. Pt  emotional, crying at this time and offered support and tissues. Focus of remainder of session on RUE ROM at shoulder, elbow, and all digits including grip strengthening x20 reps for sponge squeezes. Pt needing to use bed pan, dependently placed bed level and hand off to MD making rounds at this time. NT aware pt on bed pan.   Therapy Documentation Precautions:  Precautions Precautions: Cervical,Fall Required Braces or Orthoses: Cervical Brace Cervical Brace: Soft collar,For comfort Restrictions Weight Bearing Restrictions: No     Therapy/Group: Individual Therapy  Viona Gilmore 12/15/2020, 7:21 AM

## 2020-12-15 NOTE — Discharge Instructions (Addendum)
Inpatient Rehab Discharge Instructions  JARAE PANAS III Discharge date and time: No discharge date for patient encounter.   Activities/Precautions/ Functional Status: Activity: As tolerated Diet: Diabetic diet Wound Care: Routine skin checks Functional status:  ___ No restrictions     ___ Walk up steps independently ___ 24/7 supervision/assistance   ___ Walk up steps with assistance ___ Intermittent supervision/assistance  ___ Bathe/dress independently ___ Walk with walker     __x_ Bathe/dress with assistance ___ Walk Independently    ___ Shower independently ___ Walk with assistance    ___ Shower with assistance ___ No alcohol     ___ Return to work/school ________  COMMUNITY REFERRALS UPON DISCHARGE:    Home Health:   PT     OT     RN                   Agency: [facility]  Phone: [phone]   Medical Equipment/Items Ordered: hospital bed                                                 Agency/Supplier: [facility]   Special Instructions: No driving smoking or alcohol    My questions have been answered and I understand these instructions. I will adhere to these goals and the provided educational materials after my discharge from the hospital.  Patient/Caregiver Signature _______________________________ Date __________  Clinician Signature _______________________________________ Date __________  Please bring this form and your medication list with you to all your follow-up doctor's appointments.     Information on my medicine - ELIQUIS (apixaban)  This medication education was reviewed with me or my healthcare representative as part of my discharge preparation.  Why was Eliquis prescribed for you? Eliquis was prescribed to treat blood clots that may have been found in the veins of your legs (deep vein thrombosis) or in your lungs (pulmonary embolism) and to reduce the risk of them occurring again.  What do You need to know about Eliquis  ? The dose is ONE 5 mg tablet taken TWICE daily.  Eliquis may be taken with or without food.   Try to take the dose about the same time in the morning and in the evening. If you have difficulty swallowing the tablet whole please discuss with your pharmacist how to take the medication safely.  Take Eliquis exactly as prescribed and DO NOT stop taking Eliquis without talking to the doctor who prescribed the medication.  Stopping may increase your risk of developing a new blood clot.  Refill your prescription before you run out.  After discharge, you should have regular check-up appointments with your healthcare provider that is prescribing your Eliquis.    What do you do if you miss a dose? If a dose of ELIQUIS is not taken at the scheduled time, take it as soon as possible on the same day and twice-daily administration should be resumed. The dose should not be doubled to make up for a missed dose.  Important Safety Information A possible side effect of Eliquis is bleeding. You should call your healthcare provider right away if you experience any of the following: ? Bleeding from an injury or your nose that does not stop. ? Unusual colored urine (red or dark brown) or unusual colored stools (red or black). ? Unusual bruising for unknown reasons. ? A  serious fall or if you hit your head (even if there is no bleeding).  Some medicines may interact with Eliquis and might increase your risk of bleeding or clotting while on Eliquis. To help avoid this, consult your healthcare provider or pharmacist prior to using any new prescription or non-prescription medications, including herbals, vitamins, non-steroidal anti-inflammatory drugs (NSAIDs) and supplements.  This website has more information on Eliquis (apixaban): http://www.eliquis.com/eliquis/home

## 2020-12-15 NOTE — Progress Notes (Signed)
Physical Therapy Weekly Progress Note  Patient Details  Name: Daniel Cline MRN: [id number] Date of Birth: 1956-01-24  Beginning of progress report period: December 07, 2020 End of progress report period: December 14, 2020  Today's Date: 12/15/2020 PT Individual Time: 0800-0915 PT Individual Time Calculation (min): 75 min   Patient has met 0 of 3 short term goals.  Pt continues to be limited with pain and refuses to attempt sitting EOB much of the time. Pt has had family present for multiple therapy sessions and has been updated on this as well. Pt continues to be min A rolling, mod A-max A for supine<>sit and only tolerating <2 mins. Pt only tolerating transfers to [facility] with lateral supine transfer with x3 assist and then able to recline more upright in WC for 1-1.5 hours.   Patient continues to demonstrate the following deficits muscle weakness, decreased cardiorespiratoy endurance, decreased coordination and decreased motor planning and decreased sitting balance, decreased standing balance, decreased postural control and decreased balance strategies and therefore will continue to benefit from skilled PT intervention to increase functional independence with mobility.  Patient progressing toward long term goals..  Continue plan of care.  PT Short Term Goals Week 1:  PT Short Term Goal 1 (Week 1): Pt will tolerate sitting in appropriate wc x 1 hr to allow for participation in therapy session PT Short Term Goal 1 - Progress (Week 1): Met PT Short Term Goal 2 (Week 1): Pt will roll L/R w/mod assist and cues PT Short Term Goal 2 - Progress (Week 1): Met PT Short Term Goal 3 (Week 1): Pt will tolerate sitting on edge of bed/mat x 10 min w/occasional repositioning if needed and mod assist PT Short Term Goal 3 - Progress (Week 1): Not met Week 2:  PT Short Term Goal 1 (Week 2): pt to demonstrate upright sitting position at least ~75 degrees to increase safety with transfers and sitting tolerance PT  Short Term Goal 1 - Progress (Week 2): Not met PT Short Term Goal 2 (Week 2): pt to demonstrate supine>sit mod A x1 PT Short Term Goal 2 - Progress (Week 2): Not met PT Short Term Goal 3 (Week 2): Pt will tolerate sitting on edge of bed/mat x 10 min w/occasional repositioning if needed and mod assist PT Short Term Goal 3 - Progress (Week 2): Not met      Skilled Therapeutic Interventions/Progress Updates:    pt received in bed and agreeable to therapy, DIL present and provided motivation and emotional support. Pt directed in rolling multiple times in bed, min A with great extra time to encourage pt to actively participate with good effect. Total A for donning/doffing shirt and pants at bed level. PT spent great amount of time encouraging pt to participate with attempting to sit EOB or transfer to recliner, pt's DIL present and attempting to encourage and motivate pt as well and educate on importance to attempt transfers for progress to go home. Pt reported he understood but it was too painful and he could not handle it and would not attempt. Pt adamantly refused with several attempts. Pt total A x2 upward scooting for positioning in bed. Pt directed in progression of increased HOB elevated up to 50 degrees with TED hose in place improved tolerance with BLE activity 2x20 heel slides, hip flexion/knee flexion, hip abduction and adduction. Pt requested to return to 45 degrees, then 50, then 40. Pt left at 40 degrees, no adverse symptoms noted and DIL present,  pt left with All needs in reach and in good condition. Call light in hand.  And alarm set.   Therapy Documentation Precautions:  Precautions Precautions: Cervical,Fall Required Braces or Orthoses: Cervical Brace Cervical Brace: Soft collar,For comfort Restrictions Weight Bearing Restrictions: No General:   Vital Signs:   Pain: Pain Assessment Pain Scale: 0-10 Pain Score: 6  Pain Location: Neck Pain Orientation: Anterior Pain Descriptors  / Indicators: Throbbing Pain Frequency: Constant Pain Intervention(s): Repositioned Vision/Perception     Mobility:   Locomotion :    Trunk/Postural Assessment :    Balance:   Exercises:   Other Treatments:     Therapy/Group: Individual Therapy  [name] 12/15/2020, 12:23 PM

## 2020-12-15 NOTE — Patient Care Conference (Signed)
Inpatient RehabilitationTeam Conference and Plan of Care Update Date: 12/15/2020   Time: 11:40 AM    Patient Name: Daniel Cline      Medical Record Number: [id number]  Date of Birth: [birth year] Sex: Male         Room/Bed: 4W03C/4W03C-01 Payor Info: Payor: MEDICARE / Plan: MEDICARE PART A / Product Type: *No Product type* /    Admit Date/Time:  11/28/2020  1:22 PM  Primary Diagnosis:  Cervical myelopathy [facility])  Hospital Problems: Principal Problem:   Cervical myelopathy (HCC) Active Problems:   Epidural intraspinal abscess   Hypertension associated with diabetes (HCC)   Uncontrolled type 2 diabetes mellitus with hyperglycemia [facility])   Neurogenic bowel   Neurogenic bladder    Expected Discharge Date: Expected Discharge Date: 12/23/20  Team Members Present: Physician leading conference: Dr. Sula Soda Care Coodinator Present: Kennyth Arnold, RN, BSN, CRRN;Becky Dupree, LCSW Nurse Present: Kennyth Arnold, RN PT Present: Peter Congo, PT OT Present: Earleen Newport, OT PPS Coordinator present : Fae Pippin, SLP     Current Status/Progress Goal Weekly Team Focus  Bowel/Bladder   Pt. is currently on bowel and bladder program. LBM 12/13/20  Pt. will maintain rugular bowel and bladder pattern  Continue B/B program   Swallow/Nutrition/ Hydration             ADL's   Still requires Max-Total for all BADLs and transfers. Unable to fully participate in therapies this weekend due to nausea  Min overall for LB ADLs and transfers - may be lofty  Active therapy participation, general strengthening and endurance, sitting balance, ADL retraining   Mobility   refused great majority of upright transfers; dependent x3 supine lateral transfers with sheet; min A x1 rolling, tolerated sitting EOB 1.5 min mod-max  min A transfers; supervision bed mob; min A gait 50-25'; supervision 150' WC  pain control; bed mob; transfers; gait; WC   Communication             Safety/Cognition/  Behavioral Observations            Pain   Pt. pain is well managed with current pain medications.  Assess pt. for pain on every shift and as needed and administer prn pain medication as ordered.  Notified MD if pain is not relief with pain medicatons   Skin   Surgical incision at posterior neck. MASD at gluteal folds of groin treated with desitin ointment.  Pt. will maintain skin intergrity with no skin breakdown  Assess pt. skin on every shift and as needed for skin intergrity     Discharge Planning:  Family Conference this Thursday-son and daughter in-law have been here to observe and encourage pt to participate and push through the pain. Pain still not managed,now nasuea limiting him   Team Discussion: CBG's are elevated, decreasing the Valium. Nursing reports they are doing the bowel program, bladder scanning, and I&O caths. He frequently reports 7-10/10 pain and hot packs help some. Not sleeping well due to pain. His surgical sites have healed, he has MASD to the groin and they are using Desitin. Educating on I&O cathing, bowel program, IV antibiotics, diabetes management, and Levemir injections. Family conference is scheduled for Thursday. Patient on target to meet rehab goals: no, max/total assist for ADL's, rolling for everything. Not progressed at all. Pain limiting. Min assist to roll. Dependent +3 with sheet to move to a chair. Refuses to sit up because of pain.  *See Care Plan and progress notes for long  and short-term goals.   Revisions to Treatment Plan:  Adjusting medications.  Teaching Needs: Family education, medication management, pain management, IV antibiotic education, diabetes education, Insulin injection education, skin/wound care, transfer training, balance training, endurance training, safety training, bowel/bladder education.  Current Barriers to Discharge: Decreased caregiver support, Medical stability, Home enviroment access/layout, New diabetic, Neurogenic bowel  and bladder, Wound care, Lack of/limited family support, Insurance for SNF coverage, Weight, Medication compliance and Behavior  Possible Resolutions to Barriers: Continue current medications, provide emotional support.     Medical Summary Current Status: post-operative pain, MASD to groin, post-operative pain, lethargy, nausea, IV antibiotics  Barriers to Discharge: Medical stability;Wound care  Barriers to Discharge Comments: post-operative pain, MASD to groin, post-operative pain, lethargy, nausea, IV antibiotics Possible Resolutions to Barriers/Weekly Focus: stop cymbalta and iron,  decrease valium given lethargy, increase pain medication due to severe pain, continue heating pad, encouraged use of IS   Continued Need for Acute Rehabilitation Level of Care: The patient requires daily medical management by a physician with specialized training in physical medicine and rehabilitation for the following reasons: Direction of a multidisciplinary physical rehabilitation program to maximize functional independence : Yes Medical management of patient stability for increased activity during participation in an intensive rehabilitation regime.: Yes Analysis of laboratory values and/or radiology reports with any subsequent need for medication adjustment and/or medical intervention. : Yes   I attest that I was present, lead the team conference, and concur with the assessment and plan of the team.   Tennis Must 12/15/2020, 6:18 PM

## 2020-12-15 NOTE — Progress Notes (Signed)
PHARMACY CONSULT NOTE FOR:  OUTPATIENT  PARENTERAL ANTIBIOTIC THERAPY (OPAT)  Indication: MSSA C-spine epidural abscess Regimen: Cefazolin 2g IV every 8 hours End date: 01/01/21  IV antibiotic discharge orders are pended. To discharging provider:  please sign these orders via discharge navigator,  Select New Orders & click on the button choice - Manage This Unsigned Work.     Thank you for allowing pharmacy to be a part of this patient's care.  Alphia Moh, PharmD, BCPS, BCCP Clinical Pharmacist  Please check AMION for all [facility] phone numbers After 10:00 PM, call Main Pharmacy [phone]

## 2020-12-15 NOTE — Progress Notes (Signed)
PROGRESS NOTE   Subjective/Complaints: Severely limited by pain, nausea, vomiting +atelectasis: continue IS. Heating pad does help.  Decrease valium to help with sedation.   ROS:   Pt denies SOB, abd pain, CP, and vision changes, +nausea and vomiting    Objective:   DG Chest 2 View  Result Date: 12/13/2020 CLINICAL DATA:  Nausea and vomiting EXAM: CHEST - 2 VIEW COMPARISON:  Abdominal film from previous day. FINDINGS: Cardiac shadow is within normal limits. Mild left retrocardiac atelectasis is noted. No other focal infiltrate is seen. No effusion is noted. No bony abnormality is seen. IMPRESSION: Stable left basilar atelectasis. Electronically Signed   By: Inez Catalina M.D.   On: 12/13/2020 16:29   Recent Labs    12/14/20 0504  WBC 9.3  HGB 10.3*  HCT 31.9*  PLT 442*   Recent Labs    12/14/20 0504  NA 130*  K 3.6  CL 96*  CO2 27  GLUCOSE 158*  BUN 24*  CREATININE 0.83  CALCIUM 8.6*    Intake/Output Summary (Last 24 hours) at 12/15/2020 1143 Last data filed at 12/15/2020 0815 Gross per 24 hour  Intake 597 ml  Output 710 ml  Net -113 ml        Physical Exam: Vital Signs Blood pressure 122/76, pulse 86, temperature 98.5 F (36.9 C), temperature source Oral, resp. rate 14, height '5\' 7"'  (1.702 m), weight 76.3 kg, SpO2 98 %. Gen: no distress, normal appearing HEENT: oral mucosa pink and moist, NCAT Cardio: Reg rate Chest: normal effort, normal rate of breathing Abd: soft, non-distended Ext: no edema Psych: pleasant, normal affect Skin: intact Neurological: Ox3; HOH- no significant change in sensory exam  Musculoskeletal:     Comments: LUE-strength in his Left arm is 4-/5-in the deltoid, bicep, tricep, wrist extension, grip, and finger abduction RUE-deltoid bicep and tricep are 3/5, wrist extension and grip are 3-/5, LLE-4/5 in hip flexion, knee extension, and dorsiflexion as well as  plantarflexion RLE-strength is 4-/5-in the same muscles TTP over L shoulder and L upper trap- muscles feel pretty loose.  Not assessed today Skin:    General: Skin is warm and dry.     Comments:  PICC line in his left upper extremity looks good  Neurological:  He reports his light touch is intact in all 4 extremities He has no lower extremity clonus bilaterally  Assessment/Plan: 1. Functional deficits which require 3+ hours per day of interdisciplinary therapy in a comprehensive inpatient rehab setting.  Physiatrist is providing close team supervision and 24 hour management of active medical problems listed below.  Physiatrist and rehab team continue to assess barriers to discharge/monitor patient progress toward functional and medical goals  Care Tool:  Bathing  Bathing activity did not occur: Safety/medical concerns (Per OT eval on medical hold) Body parts bathed by patient: Chest,Face,Left arm,Abdomen,Left upper leg,Right upper leg   Body parts bathed by helper: Front perineal area,Buttocks,Left upper leg,Right lower leg,Left lower leg,Right arm,Right upper leg     Bathing assist Assist Level: Total Assistance - Patient < 25%     Upper Body Dressing/Undressing Upper body dressing   What is the patient wearing?: [place] only  Upper body assist Assist Level: Maximal Assistance - Patient 25 - 49%    Lower Body Dressing/Undressing Lower body dressing      What is the patient wearing?: Pants     Lower body assist Assist for lower body dressing: Dependent - Patient 0%     Toileting Toileting    Toileting assist Assist for toileting: Dependent - Patient 0% (per staff  report, declined at eval. also limited by pain) Assistive Device Comment: foley   Transfers Chair/bed transfer  Transfers assist  Chair/bed transfer activity did not occur: Refused  Chair/bed transfer assist level: Dependent - Patient 0% (3 helpers via sheet)      Locomotion Ambulation   Ambulation assist   Ambulation activity did not occur: Safety/medical concerns          Walk 10 feet activity   Assist  Walk 10 feet activity did not occur: Safety/medical concerns        Walk 50 feet activity   Assist Walk 50 feet with 2 turns activity did not occur: Safety/medical concerns         Walk 150 feet activity   Assist Walk 150 feet activity did not occur: Safety/medical concerns         Walk 10 feet on uneven surface  activity   Assist Walk 10 feet on uneven surfaces activity did not occur: Safety/medical concerns         Wheelchair     Assist Will patient use wheelchair at discharge?: Yes Type of Wheelchair: Manual Wheelchair activity did not occur: Refused  Wheelchair assist level: Dependent - Patient 0%      Wheelchair 50 feet with 2 turns activity    Assist    Wheelchair 50 feet with 2 turns activity did not occur: Refused   Assist Level: Dependent - Patient 0%   Wheelchair 150 feet activity     Assist  Wheelchair 150 feet activity did not occur: Refused   Assist Level: Dependent - Patient 0%   Blood pressure 122/76, pulse 86, temperature 98.5 F (36.9 C), temperature source Oral, resp. rate 14, height '5\' 7"'  (1.702 m), weight 76.3 kg, SpO2 98 %.  Medical Problem List and Plan: 1.  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  Antithrombotics: -DVT/anticoagulation:  Pharmaceutical: Eliquis restarted due to acute DVT in peroneal/soleus             -antiplatelet therapy: N/A 3. Pain Management: Patient is already receiving oxycodone as needed; we started tramadol scheduled but that is not working; will DC tramadol and schedule MS Contin 15 mg  twice daily  - on Valium and Firoicet prn per NSU  D/c cymbalta which may be causing emesis.   Restart MS Contin as pain and ability to participate in therapy was worse today 4. Mood: - N/A- will see if pt needs to work with Neuropsychology due to uncontrolled pain and new SCI.              -antipsychotic agents: N/A 5. Neuropsych: This patient is capable of making decisions on his own behalf. 6. Skin/Wound Care: We will change his honeycomb dressing on  his neck to daily and as needed; will also change the dressing on his right forearm to just a dry dressing and change as needed. Less drainage today, will not require I&D  4/15- had drainage yesterday, but scant drainage today- almost healed incision- con't to follow/monitor 7. Fluids/Electrolytes/Nutrition: We will check labs on Monday 8. Uncontrolled Diabetes II- A1c of 13.9-continue patient's Levemir 10 units nightly and sliding scale insulin  4/19- BGs running 115-154s on Levemir 16 units nightly- continue regimen 9. Hypertension hx with Orthostatic hypotension-patient is on HCTZ 25 mg daily as well as Norvasc 10 mg daily and lisinopril 40 mg daily and Lopressor 50 mg twice daily-blood pressure is well controlled we will continue these medications and monitor especially with increased activity  4/15: Have reduced Lisinopril, HCTZ, Norvasc and con't Lopressor- still having episode of orthostatic hypotension- might need additional reduction- don't decreased B Blocker due to tachycardia frequently.   4/16: now hypotensive: decrease lisinopril to 38m.  Vitals:   12/15/20 0802 12/15/20 0805  BP: 122/76 122/76  Pulse: 86 86  Resp:  14  Temp:  98.5 F (36.9 C)  SpO2:  98%    10.  Neurogenic bowel-we will look to start him on a bowel program nightly after dinner with Dulcolax suppository  4/15- KUB showed diffuse constipation- will give Sorbitol 60 ml to clean him out   12.  Acute DVT in the soleus and peroneal-we will change his Lovenox to  apixaban/Eliquis 10 mg BID x 1 week, then 5 mg BID.  13.  Neurogenic bladder-we will continue his Foley for now and start Flomax tomorrow 0.4 mg q. supper and see if he is able to start to void;  we might need to titrate this as required- will try and remove foley next week.   4/14- still requiring caths- if doesn't have results, soon, will likely need foley to go home with.   4/15- voiding some, but on Max dose of Flomax- might need Foley if going to SNF or family wants it 117 Hypokalemia: supplement 453mBID today and repeat BMP tomorrow  4/8- K+ 3.5- borderline low- will recheck Monday  4/11- K+ 3.3 will replete 40 mEq x2 and recheck Thursday  15. Epidural abscess-  4/5- on  Cefazolin IV for prolonged IV ABX- pt's WBC down to 15.5 (was 14k yesterday, but 20.8 3 days ago)- low grade temps up to 100.2 in last 24 hours-will con't regimen   4/6-White count is down slightly to 14K-we will continue IV antibiotics and check CRP and ESR-weekly  4/7- afebrile- will recheck labs in AM  4/8- WBC stable- con't IV ABX.   4/11- WBC down to 11.3- ESR >140 and CRP 10.9- heading a little better- con't to monitor.   4/15 WBC 9.8, monitor weekly 16. Dysphagia?  D2 diet due to lack of teeth, not due to dysphagia- doesn't need pills crushed. 17. Vomiting: d/c cymbalta which may be contributing. IV zofran. KUB ordered and shows no abdominal pathology. Trial Reglan. D/c Iron.  18. Atelectasis: Recommend incentive spirometer to improve breathing strength and ability over time. Provided incentive spirometer education. 19. Lethargy: decrease valium to 8m71mID.    LOS: 17 days A FACE TO FACE EVALUATION WAS PERFORMED  KruMartha ClanRaulkar 12/15/2020, 11:43 AM

## 2020-12-15 NOTE — Progress Notes (Signed)
Patient ID: Daniel Cline, male   DOB: 1956/01/22, 65 y.o.   MRN: [id number]  Message left for son-Mark regarding team conference progress slow to minimal, target discharge date set for 4/27. Family Conference Thursday to discuss further, pt will be total care at home. Will need to do education regarding his care and see if family able to manage him. Insurance corrected and now has medicare part A only not [employer]-medicare.

## 2020-12-16 LAB — GLUCOSE, CAPILLARY
Glucose-Capillary: 122 mg/dL — ABNORMAL HIGH (ref 70–99)
Glucose-Capillary: 124 mg/dL — ABNORMAL HIGH (ref 70–99)
Glucose-Capillary: 122 mg/dL — ABNORMAL HIGH (ref 70–99)
Glucose-Capillary: 133 mg/dL — ABNORMAL HIGH (ref 70–99)

## 2020-12-16 MED ORDER — SODIUM CHLORIDE 0.9 % IV SOLN
INTRAVENOUS | Status: DC | PRN
  Administered 2020-12-17 – 2020-12-20 (×2): 200 mL via INTRAVENOUS

## 2020-12-16 NOTE — Progress Notes (Signed)
Pt stated that he did not want suppository tonight because he had 4-5 BM last night, and wanted to get some rest. Pt educated on indication for bowel regimen, but pt still refused.

## 2020-12-16 NOTE — Progress Notes (Signed)
PROGRESS NOTE   Subjective/Complaints: Patient's chart reviewed- No issues reported overnight Vitals signs stable Had BM yesterday with bowel program   ROS:   Pt denies SOB, abd pain, CP, and vision changes, +nausea and vomiting, +neck pain  Objective:   No results found. Recent Labs    12/14/20 0504  WBC 9.3  HGB 10.3*  HCT 31.9*  PLT 442*   Recent Labs    12/14/20 0504  NA 130*  K 3.6  CL 96*  CO2 27  GLUCOSE 158*  BUN 24*  CREATININE 0.83  CALCIUM 8.6*    Intake/Output Summary (Last 24 hours) at 12/16/2020 0957 Last data filed at 12/16/2020 0752 Gross per 24 hour  Intake 120 ml  Output 1226 ml  Net -1106 ml        Physical Exam: Vital Signs Blood pressure 117/87, pulse 68, temperature 98.3 F (36.8 C), resp. rate 17, height '5\' 7"'  (1.702 m), weight 76.3 kg, SpO2 94 %. Gen: no distress, normal appearing HEENT: oral mucosa pink and moist, NCAT Cardio: Reg rate Chest: normal effort, normal rate of breathing Abd: soft, non-distended Ext: no edema Psych: pleasant, normal affect  Musculoskeletal:     Comments: LUE-strength in his Left arm is 4-/5-in the deltoid, bicep, tricep, wrist extension, grip, and finger abduction RUE-deltoid bicep and tricep are 3/5, wrist extension and grip are 3-/5, LLE-4/5 in hip flexion, knee extension, and dorsiflexion as well as plantarflexion RLE-strength is 4-/5-in the same muscles TTP over L shoulder and L upper trap- muscles feel pretty loose.  Not assessed today Skin:    General: Skin is warm and dry.     Comments:  PICC line in his left upper extremity looks good  Neurological:  He reports his light touch is intact in all 4 extremities He has no lower extremity clonus bilaterally  Assessment/Plan: 1. Functional deficits which require 3+ hours per day of interdisciplinary therapy in a comprehensive inpatient rehab setting.  Physiatrist is providing close  team supervision and 24 hour management of active medical problems listed below.  Physiatrist and rehab team continue to assess barriers to discharge/monitor patient progress toward functional and medical goals  Care Tool:  Bathing  Bathing activity did not occur: Safety/medical concerns (Per OT eval on medical hold) Body parts bathed by patient: Chest,Face,Left arm,Abdomen,Left upper leg,Right upper leg   Body parts bathed by helper: Front perineal area,Buttocks,Left upper leg,Right lower leg,Left lower leg,Right arm,Right upper leg     Bathing assist Assist Level: Total Assistance - Patient < 25%     Upper Body Dressing/Undressing Upper body dressing   What is the patient wearing?: Hospital gown only    Upper body assist Assist Level: Maximal Assistance - Patient 25 - 49%    Lower Body Dressing/Undressing Lower body dressing      What is the patient wearing?: Pants     Lower body assist Assist for lower body dressing: Dependent - Patient 0%     Toileting Toileting    Toileting assist Assist for toileting: Dependent - Patient 0% (per staff  report, declined at eval. also limited by pain) Assistive Device Comment: foley   Transfers Chair/bed transfer  Transfers  assist  Chair/bed transfer activity did not occur: Refused  Chair/bed transfer assist level: Dependent - Patient 0% (3 helpers via sheet)     Locomotion Ambulation   Ambulation assist   Ambulation activity did not occur: Safety/medical concerns          Walk 10 feet activity   Assist  Walk 10 feet activity did not occur: Safety/medical concerns        Walk 50 feet activity   Assist Walk 50 feet with 2 turns activity did not occur: Safety/medical concerns         Walk 150 feet activity   Assist Walk 150 feet activity did not occur: Safety/medical concerns         Walk 10 feet on uneven surface  activity   Assist Walk 10 feet on uneven surfaces activity did not occur:  Safety/medical concerns         Wheelchair     Assist Will patient use wheelchair at discharge?: Yes Type of Wheelchair: Manual Wheelchair activity did not occur: Refused  Wheelchair assist level: Dependent - Patient 0%      Wheelchair 50 feet with 2 turns activity    Assist    Wheelchair 50 feet with 2 turns activity did not occur: Refused   Assist Level: Dependent - Patient 0%   Wheelchair 150 feet activity     Assist  Wheelchair 150 feet activity did not occur: Refused   Assist Level: Dependent - Patient 0%   Blood pressure 117/87, pulse 68, temperature 98.3 F (36.8 C), resp. rate 17, height '5\' 7"'  (1.702 m), weight 76.3 kg, SpO2 94 %.  Medical Problem List and Plan: 1.  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  Acute DVT in peroneal/soleus: eliquis restarted 3. Pain Management: Patient is already receiving oxycodone as needed; we started tramadol scheduled but that is not working; will DC tramadol and schedule MS Contin 15 mg twice daily  - on Valium and Firoicet prn per NSU  D/c cymbalta which may be causing emesis.   Contine MS Contin as pain and ability to participate in therapy limited by pain.  4. Mood: - Messaged SW regarding adding Mr. Gangemi to Neuropsychologyschedule due to uncontrolled pain and new SCI.              -antipsychotic agents: N/A 5. Neuropsych: This patient is capable of making decisions on his own behalf. 6. Skin/Wound Care: We will change his honeycomb dressing on his neck to daily and as needed; will also change the dressing on his right forearm to just a dry dressing and change as needed. Less drainage today, will not require I&D  4/15- had drainage yesterday, but scant drainage today-  almost healed incision- con't to follow/monitor 7. Fluids/Electrolytes/Nutrition: We will check labs on Monday 8. Uncontrolled Diabetes II- A1c of 13.9-continue patient's Levemir 10 units nightly and sliding scale insulin  4/19- BGs running 115-154s on Levemir 16 units nightly- continue regimen 9. Hypertension hx with Orthostatic hypotension-patient is on HCTZ 25 mg daily as well as Norvasc 10 mg daily and lisinopril 40 mg daily and Lopressor 50 mg twice daily-blood  pressure is well controlled we will continue these medications and monitor especially with increased activity  4/15: Have reduced Lisinopril, HCTZ, Norvasc and con't Lopressor- still having episode of orthostatic hypotension- might need additional reduction- don't decreased B Blocker due to tachycardia frequently.   4/16: now hypotensive: decrease lisinopril to 76m.  Vitals:   12/16/20 0425 12/16/20 0821  BP: 123/70 117/87  Pulse: 78 68  Resp: 17   Temp: 98.3 F (36.8 C)   SpO2: 94%     10.  Neurogenic bowel-we will look to start him on a bowel program nightly after dinner with Dulcolax suppository  4/15- KUB showed diffuse constipation- will give Sorbitol 60 ml to clean him out   12.  Acute DVT in the soleus and peroneal-we will change his Lovenox to apixaban/Eliquis 10 mg BID x 1 week, then 5 mg BID.  13.  Neurogenic bladder-we will continue his Foley for now and start Flomax tomorrow 0.4 mg q. supper and see if he is able to start to void;  we might need to titrate this as required- will try and remove foley next week.   4/14- still requiring caths- if doesn't have results, soon, will likely need foley to go home with.   4/15- voiding some, but on Max dose of Flomax- might need Foley if going to SNF or family wants it 128 Hypokalemia: supplement 463mBID today and repeat BMP tomorrow  4/8- K+ 3.5- borderline low- will recheck Monday  4/11- K+ 3.3 will replete 40 mEq x2 and recheck Thursday  15. Epidural abscess-  4/5- on   Cefazolin IV for prolonged IV ABX- pt's WBC down to 15.5 (was 14k yesterday, but 20.8 3 days ago)- low grade temps up to 100.2 in last 24 hours-will con't regimen   4/6-White count is down slightly to 14K-we will continue IV antibiotics and check CRP and ESR-weekly  4/7- afebrile- will recheck labs in AM  4/8- WBC stable- con't IV ABX.   4/11- WBC down to 11.3- ESR >140 and CRP 10.9- heading a little better- con't to monitor.   4/15 WBC 9.8, monitor weekly 16. Dysphagia?  D2 diet due to lack of teeth, not due to dysphagia- doesn't need pills crushed. 17. Vomiting: d/c cymbalta which may be contributing. IV zofran. KUB ordered and shows no abdominal pathology. Trial Reglan. D/c Iron.  18. Atelectasis: Recommend incentive spirometer to improve breathing strength and ability over time. Provided incentive spirometer education. 19. Lethargy: decrease valium to 8m14mID.    LOS: 18 days A FACE TO FACE EVALUATION WAS PERFORMED  KruClide Deutscherulkar 12/16/2020, 9:57 AM

## 2020-12-16 NOTE — Progress Notes (Signed)
Physical Therapy Session Note  Patient Details  Name: Daniel Cline MRN: [id number] Date of Birth: November 21, 1955  Today's Date: 12/17/2020 PT Individual Time:  -      Short Term Goals: Week 2:  PT Short Term Goal 1 (Week 2): pt to demonstrate upright sitting position at least ~75 degrees to increase safety with transfers and sitting tolerance PT Short Term Goal 1 - Progress (Week 2): Not met PT Short Term Goal 2 (Week 2): pt to demonstrate supine>sit mod A x1 PT Short Term Goal 2 - Progress (Week 2): Not met PT Short Term Goal 3 (Week 2): Pt will tolerate sitting on edge of bed/mat x 10 min w/occasional repositioning if needed and mod assist PT Short Term Goal 3 - Progress (Week 2): Not met  Skilled Therapeutic Interventions/Progress Updates:  Patient supine in bed upon PT arrival. Patient alert and agreeable to PT session although states he is in a high level of pain. Patient with increasing pain with all efforts and changes to position.  Therapeutic Activity: Bed Mobility: Pt agreeable with DIL's encouragement to attempt supine to sit. DIL states that to L side is easier for pt to roll to use RUE for assist. Roll to L side with Min/ mod A of 1. 2 person assist with support to head, neck and upper back as well as to trunk with chuck pad and Max A. Related to pt to initiate with attempt and assist provided. Pt states he is attempting but no muscle activation noted. Assist provided with pt's pain level increasing and requesting to stop. Pt agreeable to roll to R side requiring min/ mod A to complete. Attempt to sit with 2 person Max A and pt tearing with pain. Pt positioned in bed for comfort and therapist providing appreciation to pt for attempting despite pain.   Patient supine in bed at end of session with brakes locked, bed alarm set, and all needs within reach with DIL in room.     Therapy Documentation Precautions:  Precautions Precautions: Cervical,Fall Required Braces or  Orthoses: Cervical Brace Cervical Brace: Soft collar,For comfort Restrictions Weight Bearing Restrictions: No   Therapy/Group: Individual Therapy  Alger Simons 12/16/2020, 5:05 PM

## 2020-12-16 NOTE — Progress Notes (Signed)
Occupational Therapy Weekly Progress Note  Patient Details  Name: Daniel Cline MRN: [id number] Date of Birth: [birth year]  Beginning of progress report period: December 09, 2020 End of progress report period: December 16, 2020  Today's Date: 12/16/2020 OT Individual Time: 1345-1443 OT Individual Time Calculation (min): 58 min    Patient has met 2 of 5 short term goals.  Pt has made limited progress toward OT goals d/t limited participation with seated or OOB activity d/t pain in neck/shoulders. Pt is Max A with UB dressing at bed level, Max A with LB dressing as well with rolling L/R over hips, requires 3 assist for transfers to/from wheelchair, but is able to feed self with Supervision and perform some grooming and hygiene tasks. Pt limited with HOB or wheelchair >40* elevation. Plan for family ed in upcoming sessions.   Patient continues to demonstrate the following deficits: muscle weakness, decreased cardiorespiratoy endurance, impaired timing and sequencing, abnormal tone, unbalanced muscle activation, motor apraxia, decreased coordination and decreased motor planning, decreased motor planning and decreased sitting balance, decreased postural control and decreased balance strategies and therefore will continue to benefit from skilled OT intervention to enhance overall performance with BADL and Reduce care partner burden.  Patient not progressing toward long term goals.  See goal revision..  Plan of care revisions: goals downgraded to Mod/Max overall d/t lack of progress.  OT Short Term Goals Week 2:  OT Short Term Goal 1 (Week 2): Pt will improve R hand grip strength to 20# to perform with self feeding/grooming with Supervision OT Short Term Goal 1 - Progress (Week 2): Progressing toward goal OT Short Term Goal 2 (Week 2): Pt will transition supine > sit w/ Max of 1 in prep for ADL OT Short Term Goal 2 - Progress (Week 2): Met OT Short Term Goal 3 (Week 2): Pt will roll L/R with Max A of  1 to assist with bed level ADL OT Short Term Goal 3 - Progress (Week 2): Met OT Short Term Goal 4 (Week 2): Pt will tolerate HOB >50* elevation for 5+ minutes during IADL/ADL OT Short Term Goal 4 - Progress (Week 2): Progressing toward goal OT Short Term Goal 5 (Week 2): Pt will thread pants bed level with reacher Mod A OT Short Term Goal 5 - Progress (Week 2): Progressing toward goal Week 3:  OT Short Term Goal 1 (Week 3): STGs = LTGs d/t ELOS  Skilled Therapeutic Interventions/Progress Updates:    Pt greeted at time of session supine in bed resting agreeable to OT session with encouragement, lethargic intially improving throughout session. Rolling L/R with Mod A overall for pressure relief holding for 30 second intervals on each side. Focus on general ROM and strengthening with 2x20 bridges, knee extension/kick outs, and hip twists all for trunk control and glute strengthening. Trialed supine > sit 1x with Max A, able to tolerate approx 10 seconds before returning to supine, better than yesterday with pain subsiding more quickly. Scoot up in bed 2 helpers. Brushed teeth with Min A and red gripper. Focus remaining session on BUE ROM moreso L>R d/t deficits in L shoulder for shoulder flex/ext, abd/add, circles/circumduction, and scap facilitation when needed. Provided ensure at end of session for pt to drink after clearing with RN, able to drink Supervision. Alarm on call bell in reach.    Therapy Documentation Precautions:  Precautions Precautions: Cervical,Fall Required Braces or Orthoses: Cervical Brace Cervical Brace: Soft collar,For comfort Restrictions Weight Bearing Restrictions: No  Therapy/Group: Individual Therapy  Viona Gilmore 12/16/2020, 7:20 AM

## 2020-12-16 NOTE — Progress Notes (Signed)
Physical Therapy Session Note  Patient Details  Name: Daniel Cline MRN: [id number] Date of Birth: Apr 01, 1956  Today's Date: 12/16/2020 PT Individual Time: 0930-1015 PT Individual Time Calculation (min): 45 min   Short Term Goals: Week 2:  PT Short Term Goal 1 (Week 2): pt to demonstrate upright sitting position at least ~75 degrees to increase safety with transfers and sitting tolerance PT Short Term Goal 1 - Progress (Week 2): Not met PT Short Term Goal 2 (Week 2): pt to demonstrate supine>sit mod A x1 PT Short Term Goal 2 - Progress (Week 2): Not met PT Short Term Goal 3 (Week 2): Pt will tolerate sitting on edge of bed/mat x 10 min w/occasional repositioning if needed and mod assist PT Short Term Goal 3 - Progress (Week 2): Not met  Skilled Therapeutic Interventions/Progress Updates: Pt presents asleep in bed, but able to arouse.  Pt states feeling better today and agreeable to therapy.  Pt performed multiple rolling side to side to don shorts after threading legs through.  Pt requires mod A for rolling to complete fully to sidelying, but able to reach for side rails and move into hooklying.  Pt able to pull to left > right.  Pt required 2 person assist for left side-lying to sit and then mod A for maintaining upright posture w/ c/o pain.  Pt unable to sit greater than 2".  Pt returned to supine w/ assist of 2.  Pt rolled side to side to place blanket under pt for lateral scoot to w/c from supine position.  Pt required assist of 3 in sequential scoots to w/c.  Head of w/c elevated in sequential moves up to 50 degrees and tolerated well.  Pt performed 3 x 10 AP, QS and isometric add.  Pt remained in w/c w/ head elevated to 50 degrees and chair alarm on and all needs in reach.  Pt handed off to NT, pt to remain in w/c x 1 hour and then returned to bed.     Therapy Documentation Precautions:  Precautions Precautions: Cervical,Fall Required Braces or Orthoses: Cervical Brace Cervical  Brace: Soft collar,For comfort Restrictions Weight Bearing Restrictions: No General:   Vital Signs: Therapy Vitals Pulse Rate: 68 BP: 117/87 Pain:10/10 across shoulders Pain Assessment Pain Scale: 0-10 Pain Score: 3  Mobility:      Therapy/Group: Individual Therapy  Ladoris Gene 12/16/2020, 10:37 AM

## 2020-12-17 ENCOUNTER — Inpatient Hospital Stay (HOSPITAL_COMMUNITY): Payer: Medicare Other

## 2020-12-17 DIAGNOSIS — E43 Unspecified severe protein-calorie malnutrition: Secondary | ICD-10-CM | POA: Insufficient documentation

## 2020-12-17 LAB — GLUCOSE, CAPILLARY
Glucose-Capillary: 114 mg/dL — ABNORMAL HIGH (ref 70–99)
Glucose-Capillary: 132 mg/dL — ABNORMAL HIGH (ref 70–99)
Glucose-Capillary: 142 mg/dL — ABNORMAL HIGH (ref 70–99)
Glucose-Capillary: 143 mg/dL — ABNORMAL HIGH (ref 70–99)

## 2020-12-17 IMAGING — DX DG CERVICAL SPINE 2 OR 3 VIEWS
4 series · 4 of 4 positions shown · non-contrast
Comparison: CT [DATE]

CLINICAL DATA: Pain

EXAM:
CERVICAL SPINE - 2-3 VIEW

[c-spine lat]
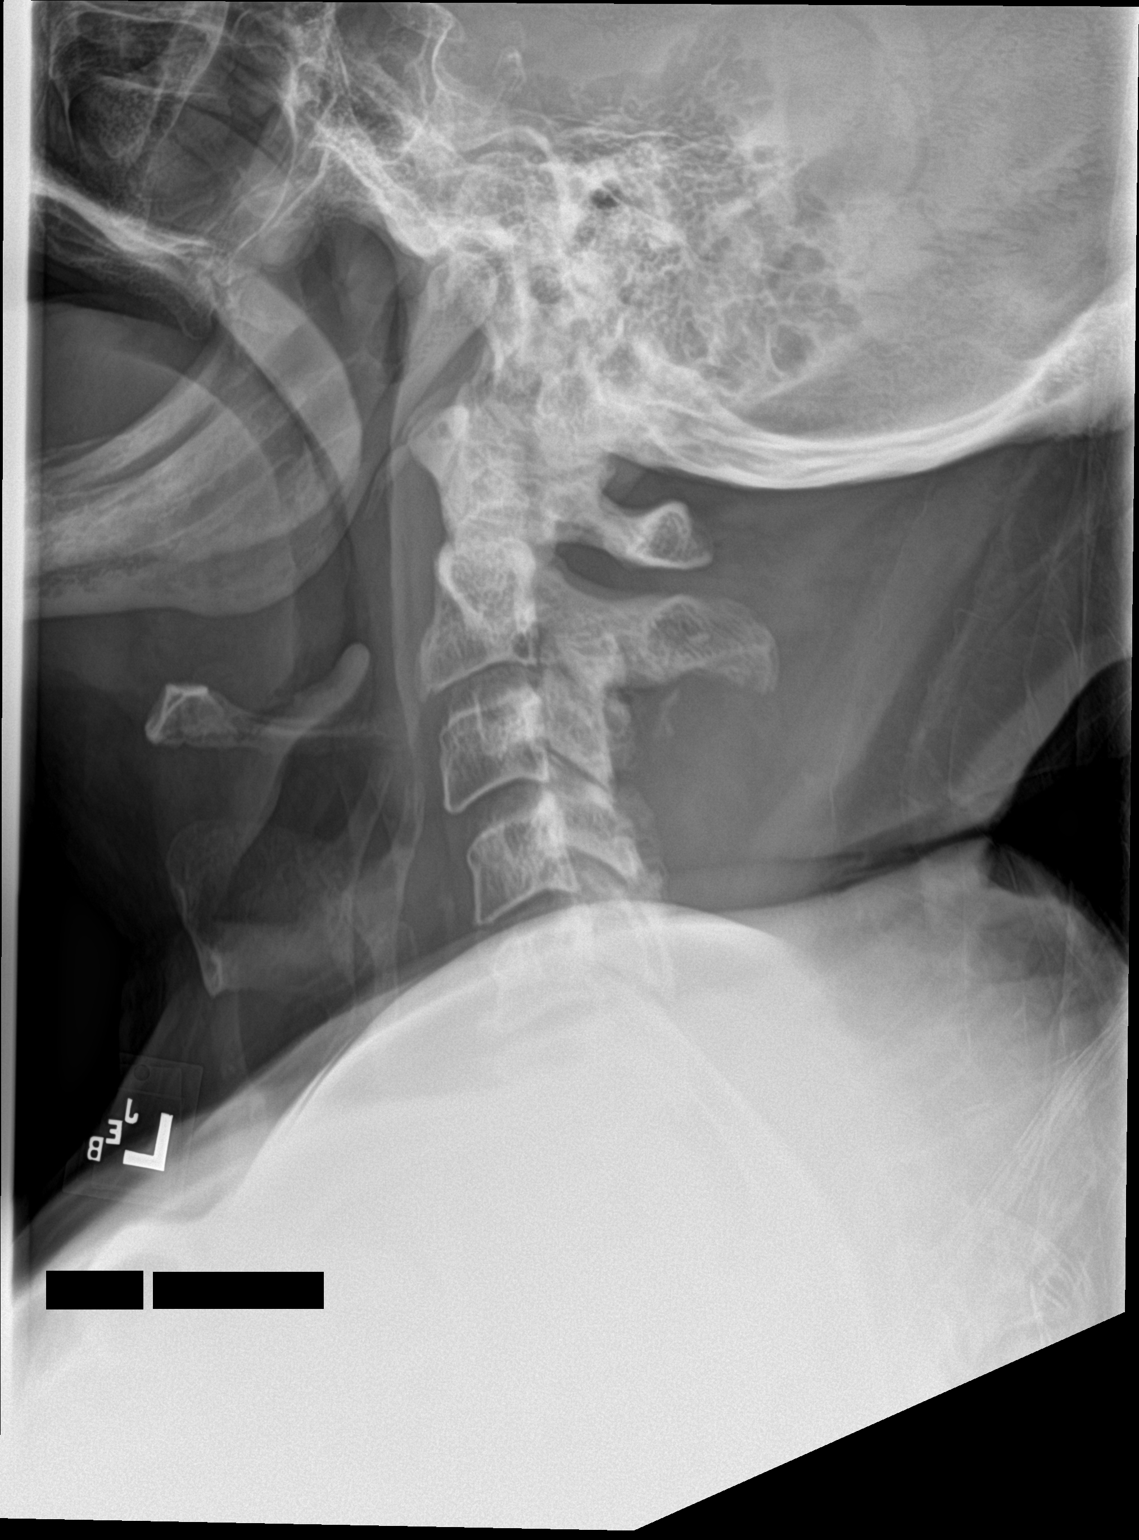

[c-spine ap]
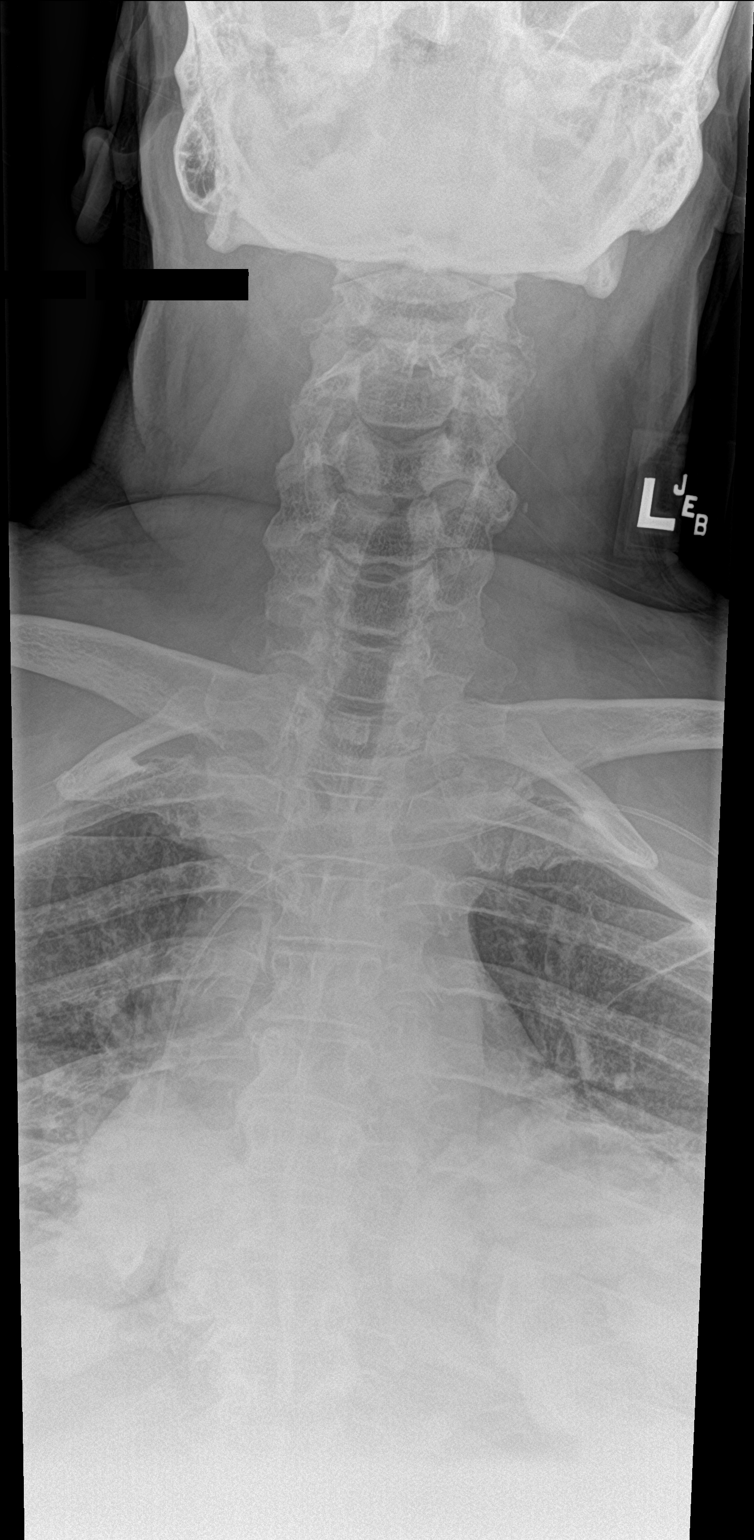

[c-spine open mouth]
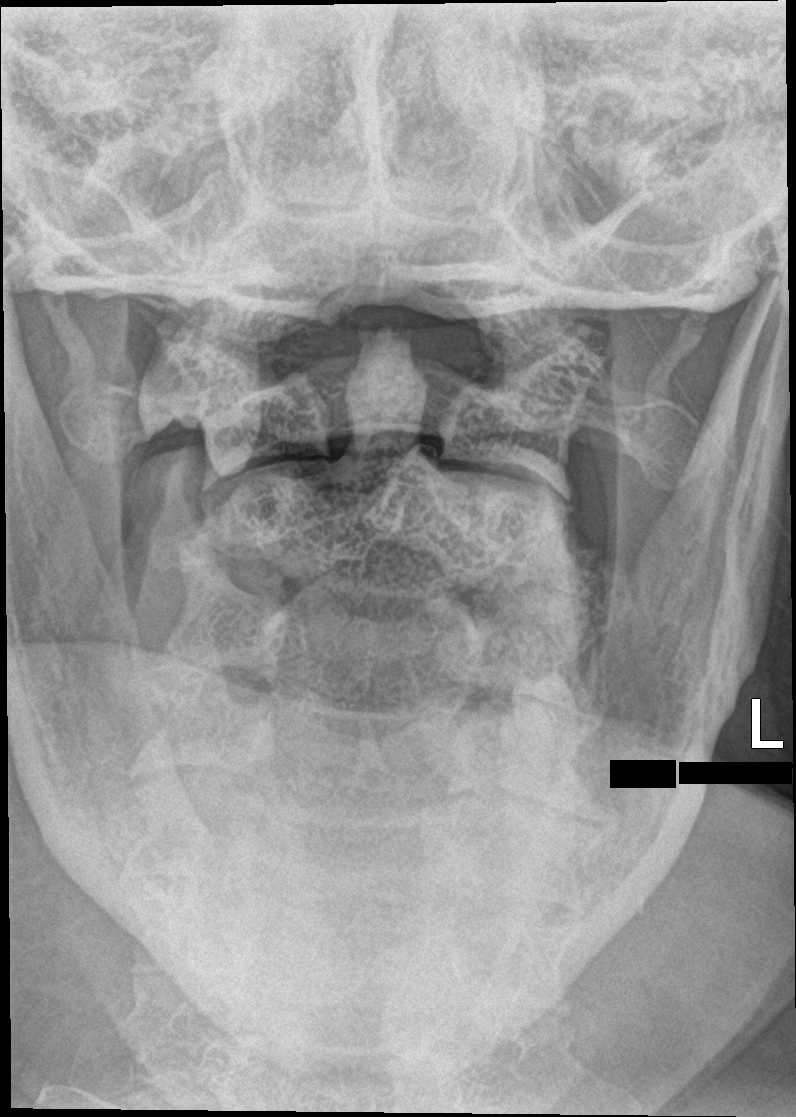

[c-spine swimmers]
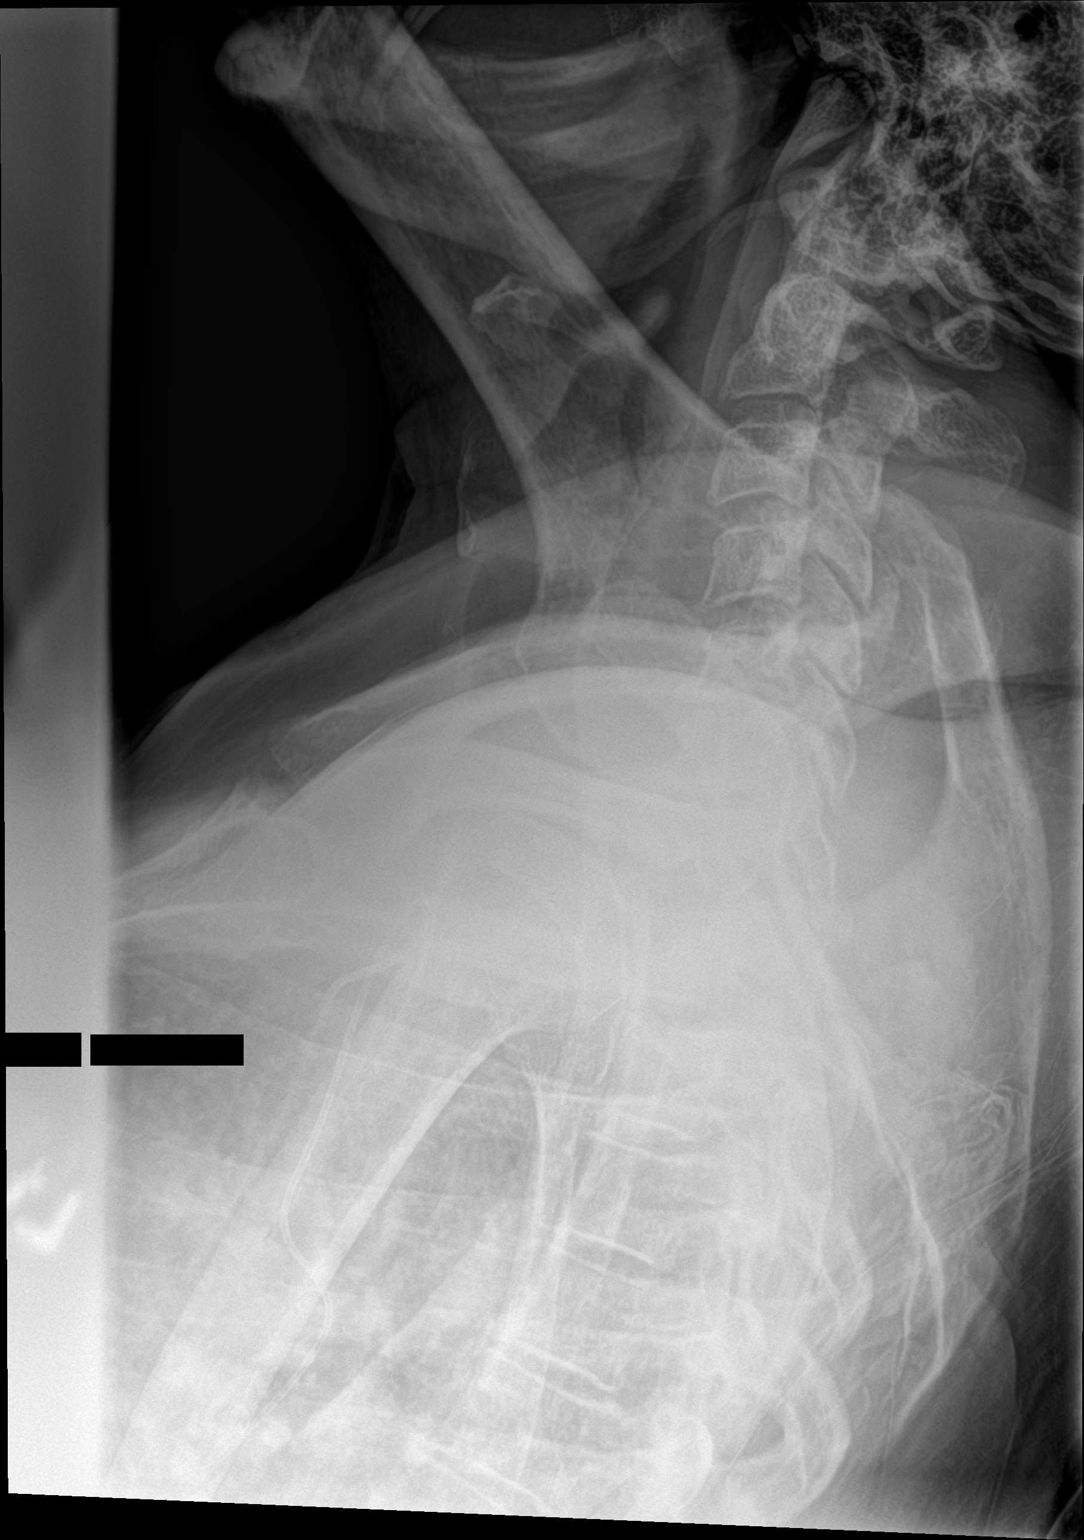

[4 of 4 positions shown; findings below may reference images not displayed]

FINDINGS: Straightening of the cervical spine. Poor visualization C5 and
below. Partially visualized laminectomy changes at C3-C4 and C5. The
dens and lateral masses are within normal limits. The prevertebral
soft tissue thickness is normal. There is disc space narrowing at
C5-C6.
IMPRESSION: Poor visualization C5 and below. Straightening of the cervical spine
with degenerative change at C5-C6. Partially visualized laminectomy
changes at C3 through the lower cervical spine.

## 2020-12-17 NOTE — Progress Notes (Signed)
Nutrition Follow-up  DOCUMENTATION CODES:   Severe malnutrition in context of acute illness/injury  INTERVENTION:   - Continue Ensure Enlive po TID, each supplement provides 350 kcal and 20 grams of protein  - Continue Magic Cup TID with meals, each supplement provides 290 kcal and 9 grams of protein  - Family to continue to bring in food from home/restaurants  - MVI with minerals daily  NUTRITION DIAGNOSIS:   Severe Malnutrition related to acute illness (cervical spine epidural abscess) as evidenced by moderate fat depletion,moderate muscle depletion,percent weight loss (21.4% weight loss in less than 1 month).  New diagnosis after completion of NFPE  GOAL:   Patient will meet greater than or equal to 90% of their needs  Progressing  MONITOR:   PO intake,Supplement acceptance,Labs,Weight trends,Skin  REASON FOR ASSESSMENT:   Malnutrition Screening Tool    ASSESSMENT:   65 year old male PMH of uncontrolled T2DM, HTN. Pt was found to have a cervical spine epidural abscess and was taken emergently to the OR and had a laminectomy and abscess evacuation. Pt also was found to have a right forearm abscess and is s/p I&D. 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 Admitted to CIR on 4/03.  Noted target d/c date of 4/27.  Spoke with pt and son at bedside. Pt reports appetite is okay. Noted that pt consumed ~50% of plate of food that family brought in for breakfast that consistent of eggs, sausage, and biscuit. Pt's son reports that he brings in food for most meals as pt does not like the food here. Pt's family bringing in chicken for lunch today. Pt's son states that the patient "does eat" but that it is the food that family brings in.  Pt reports that he is consuming some of the Ensure supplements. Noted unfinished Ensure supplement in pt's cup at bedside. Pt's son reports that he will bring in protein shakes from home. Pt enjoys [employer].  Discussed importance of adequate PO intake, especially with focus on high-protein foods. Pt and son express understanding.  Pt endorses weight loss and reports a UBW of 220 lbs. He last weighed this about a year ago. RD obtained updated bed weight: 169 lbs. Pt with a total weight loss of 20.9 kg since 11/23/20. This is a 21.4% weight loss in less than 1 month which is severe and significant for timeframe.  Meal Completion: 25-80% x last 4 documented meals  Medications reviewed and include: dulcolax, Ensure Enlive TID, hydrochlorothiazide, SSI, levemir 16 units daily, MVI with minerals daily, miralax, senna, IV abx  Labs from 4/18 reviewed: sodium 130, BUN 24 CBG's: 114-133 x 24 hours  NUTRITION - FOCUSED PHYSICAL EXAM:  Flowsheet Row Most Recent Value  Orbital Region Moderate depletion  Upper Arm Region Moderate depletion  Thoracic and Lumbar Region Mild depletion  Buccal Region Moderate depletion  Temple Region Moderate depletion  Clavicle Bone Region Moderate depletion  Clavicle and Acromion Bone Region Moderate depletion  Scapular Bone Region Unable to assess  Dorsal Hand Moderate depletion  Patellar Region Moderate depletion  Anterior Thigh Region Severe depletion  Posterior Calf Region Moderate depletion  Edema (RD Assessment) None  Hair Reviewed  Eyes Reviewed  Mouth Reviewed  Skin Reviewed  Nails Reviewed       Diet Order:   Diet Order            DIET DYS 2 Room service appropriate? Yes; Fluid consistency: Thin  Diet effective now  Diet - low sodium heart healthy                 EDUCATION NEEDS:   Education needs have been addressed  Skin:  Skin Assessment: Skin Integrity Issues: Incisions: cervical, R arm Other: MASD to groin  Last BM:  12/15/20 small type 4  Height:   Ht Readings from Last 1 Encounters:  11/28/20 5\' 7"  (1.702 m)    Weight:   Wt Readings from Last 1 Encounters:  12/17/20 76.7 kg    BMI:  Body mass index  is 26.47 kg/m.  Estimated Nutritional Needs:   Kcal:  1850-2050  Protein:  85-100 grams  Fluid:  1.8-2.0 L    12/19/20, MS, RD, LDN Inpatient Clinical Dietitian Please see [facility] for contact information.

## 2020-12-17 NOTE — Progress Notes (Signed)
PROGRESS NOTE   Subjective/Complaints: Patient's chart reviewed- No issues reported overnight Vitals signs stable Refused bowel program last night but had 4-5 BM last night  Family meeting today  ROS:   Pt denies SOB, abd pain, CP, and vision changes, +nausea and vomiting, +neck pain  Objective:   No results found. No results for input(s): WBC, HGB, HCT, PLT in the last 72 hours. No results for input(s): NA, K, CL, CO2, GLUCOSE, BUN, CREATININE, CALCIUM in the last 72 hours.  Intake/Output Summary (Last 24 hours) at 12/17/2020 0955 Last data filed at 12/17/2020 0900 Gross per 24 hour  Intake 420 ml  Output 588 ml  Net -168 ml        Physical Exam: Vital Signs Blood pressure 105/65, pulse 79, temperature 98.5 F (36.9 C), resp. rate 17, height '5\' 7"'  (1.702 m), weight 76.3 kg, SpO2 96 %. Gen: no distress, normal appearing HEENT: oral mucosa pink and moist, NCAT Cardio: Reg rate Chest: normal effort, normal rate of breathing Abd: soft, non-distended Ext: no edema Psych: pleasant, normal affect Skin: intact Musculoskeletal:     Comments: LUE-strength in his Left arm is 4-/5-in the deltoid, bicep, tricep, wrist extension, grip, and finger abduction RUE-deltoid bicep and tricep are 3/5, wrist extension and grip are 3-/5, LLE-4/5 in hip flexion, knee extension, and dorsiflexion as well as plantarflexion RLE-strength is 4-/5-in the same muscles TTP over L shoulder and L upper trap- muscles feel pretty loose.  Not assessed today Skin:    General: Skin is warm and dry.     Comments:  PICC line in his left upper extremity looks good  Neurological:  He reports his light touch is intact in all 4 extremities He has no lower extremity clonus bilaterally  Assessment/Plan: 1. Functional deficits which require 3+ hours per day of interdisciplinary therapy in a comprehensive inpatient rehab setting.  Physiatrist is  providing close team supervision and 24 hour management of active medical problems listed below.  Physiatrist and rehab team continue to assess barriers to discharge/monitor patient progress toward functional and medical goals  Care Tool:  Bathing  Bathing activity did not occur: Safety/medical concerns (Per OT eval on medical hold) Body parts bathed by patient: Chest,Face,Left arm,Abdomen,Left upper leg,Right upper leg   Body parts bathed by helper: Front perineal area,Buttocks,Left upper leg,Right lower leg,Left lower leg,Right arm,Right upper leg     Bathing assist Assist Level: Total Assistance - Patient < 25%     Upper Body Dressing/Undressing Upper body dressing   What is the patient wearing?: Hospital gown only    Upper body assist Assist Level: Maximal Assistance - Patient 25 - 49%    Lower Body Dressing/Undressing Lower body dressing      What is the patient wearing?: Pants     Lower body assist Assist for lower body dressing: Dependent - Patient 0%     Toileting Toileting    Toileting assist Assist for toileting: Dependent - Patient 0% (per staff  report, declined at eval. also limited by pain) Assistive Device Comment: foley   Transfers Chair/bed transfer  Transfers assist  Chair/bed transfer activity did not occur: Refused  Chair/bed transfer assist level: Dependent -  Patient 0% (lateral slide)     Locomotion Ambulation   Ambulation assist   Ambulation activity did not occur: Safety/medical concerns          Walk 10 feet activity   Assist  Walk 10 feet activity did not occur: Safety/medical concerns        Walk 50 feet activity   Assist Walk 50 feet with 2 turns activity did not occur: Safety/medical concerns         Walk 150 feet activity   Assist Walk 150 feet activity did not occur: Safety/medical concerns         Walk 10 feet on uneven surface  activity   Assist Walk 10 feet on uneven surfaces activity did not  occur: Safety/medical concerns         Wheelchair     Assist Will patient use wheelchair at discharge?: Yes Type of Wheelchair: Manual Wheelchair activity did not occur: Refused  Wheelchair assist level: Dependent - Patient 0%      Wheelchair 50 feet with 2 turns activity    Assist    Wheelchair 50 feet with 2 turns activity did not occur: Refused   Assist Level: Dependent - Patient 0%   Wheelchair 150 feet activity     Assist  Wheelchair 150 feet activity did not occur: Refused   Assist Level: Dependent - Patient 0%   Blood pressure 105/65, pulse 79, temperature 98.5 F (36.9 C), resp. rate 17, height '5\' 7"'  (1.702 m), weight 76.3 kg, SpO2 96 %.  Medical Problem List and Plan: 1.  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  Acute DVT in peroneal/soleus: eliquis restarted 3. Pain Management: Patient is already receiving oxycodone as needed; we started tramadol scheduled but that is not working; will DC tramadol and schedule MS Contin 15 mg twice daily  - on Valium and Firoicet prn per NSU  D/c cymbalta which may be causing emesis.   Continue MS Contin as pain and ability to participate in therapy limited by pain. XR cervical spine and soft collar ordered.  4. Mood: - Messaged SW regarding adding Mr. Seoane to Neuropsychologyschedule due to uncontrolled pain and new SCI.              -antipsychotic agents: N/A 5. Neuropsych: This patient is capable of making decisions on his own behalf. 6. Skin/Wound Care: We will change his honeycomb dressing on his neck to daily and as needed; will also change the dressing on his right forearm to just a dry dressing and change as needed. Less drainage today, will not require I&D  4/21  minimal drainage, almost healed incision- continue follow/monitor 7. Fluids/Electrolytes/Nutrition: We will check labs on Monday 8. Uncontrolled Diabetes II- A1c of 13.9-continue patient's Levemir 10 units nightly and sliding scale insulin  4/21- CBGs 114-133: continue Levemir 16 units nightly. D/c ensure 9. Hypertension hx with Orthostatic hypotension-patient is on HCTZ 25 mg daily as well as Norvasc 10 mg daily and lisinopril 40 mg daily and Lopressor 50 mg twice daily-blood pressure is well controlled we will continue these medications and monitor especially with increased activity  4/15: Have  reduced Lisinopril, HCTZ, Norvasc and con't Lopressor- still having episode of orthostatic hypotension- might need additional reduction- don't decreased B Blocker due to tachycardia frequently.   4/16: now hypotensive: decrease lisinopril to 56m.  Vitals:   12/16/20 1935 12/17/20 0426  BP: 122/75 105/65  Pulse: 85 79  Resp: 17 17  Temp: 98.6 F (37 C) 98.5 F (36.9 C)  SpO2: 96% 96%    10.  Neurogenic bowel-we will look to start him on a bowel program nightly after dinner with Dulcolax suppository  4/15- KUB showed diffuse constipation- will give Sorbitol 60 ml to clean him out   12.  Acute DVT in the soleus and peroneal-we will change his Lovenox to apixaban/Eliquis 10 mg BID x 1 week, then 5 mg BID.  13.  Neurogenic bladder-we will continue his Foley for now and start Flomax tomorrow 0.4 mg q. supper and see if he is able to start to void;  we might need to titrate this as required- will try and remove foley next week.   4/14- still requiring caths- if doesn't have results, soon, will likely need foley to go home with.   4/15- voiding some, but on Max dose of Flomax- might need Foley if going to SNF or family wants it 139 Hypokalemia: supplement 436mBID today and repeat BMP tomorrow  4/8- K+ 3.5- borderline low- will recheck Monday  4/11- K+ 3.3 will replete 40 mEq x2 and recheck Thursday   15. Epidural abscess-  4/5- on  Cefazolin IV for prolonged IV ABX- pt's WBC down to 15.5 (was 14k yesterday, but 20.8 3 days ago)- low grade temps up to 100.2 in last 24 hours-will con't regimen   4/6-White count is down slightly to 14K-we will continue IV antibiotics and check CRP and ESR-weekly  4/7- afebrile- will recheck labs in AM  4/8- WBC stable- con't IV ABX.   4/11- WBC down to 11.3- ESR >140 and CRP 10.9- heading a little better- con't to monitor.   4/15 WBC 9.8, monitor weekly 16. Dysphagia?  D2 diet due to lack of teeth, not due to dysphagia- doesn't need pills crushed. 17. Vomiting: d/c cymbalta which may be contributing. IV zofran. KUB ordered and shows no abdominal pathology. Trial Reglan. D/c Iron.  18. Atelectasis: Recommend incentive spirometer to improve breathing strength and ability over time. Provided incentive spirometer education. 19. Lethargy: decrease valium to 17m76mID.  20. Disposition: family meeting today  >35 minutes spent in discussion of patient's pain, IV antibiotics, extending CIR stay, lethargy, weaning valium, ordering XR given continued cervical spine pain, ordering cervical soft collar, discussion of what family and patient can do to promote his healing.    LOS: 19 days A FACE TO FACE EVALUATION WAS PERFORMED  KruClide Deutscherulkar 12/17/2020, 9:55 AM

## 2020-12-17 NOTE — Progress Notes (Signed)
Patient ID: Daniel Cline, male   DOB: 1956-05-24, 65 y.o.   MRN: [id number]   Patient/Family Conference:   Patient/family in attendance: Dorthea Cove and Patent attorney in attendance: Dr. Dalene Carrow, Haley-PT, Hannah-OT, Becky-SW  Main focus:pt's care, participation in therapies, family education, families medical quesitons/concerns  Synopsis of information shared: MD shared medical update and family requested another scan to check his neck due to pain issues. Need for IV antibiotics and extensive family education prior to discharge. Pt has pushed himself past two days and is doing more in his therapy sessions. Wrong insurance authorized and pre-approved. He actually has Medicare part A only and not [employer]-Medicare, assigned to another gentleman same name and birth date.   Barriers/concerns expressed by patient and family:The amount of care pt requires. Pain still a huge issue and now insurance issues.  Patient/family response:Pt became tearful thinking how much he needs to do to get better and that he is trying. Son and daughter in-law have been here and participated in therapies with him. Concerns regarding pain issues and need for another scan-which MD agreed with. Main goal is to get pt/Dad home. Will need to sign up for a Medicare Advantage Plan for coverage for IV and HH along with DME and MD bills while here.   Follow-up/action plans:Daughter in-law to look into signing pt up with Medicare Advantage Plan for coverage. MD to re-order soft collar and scan his neck. Son and daughter in-law to continue to be present and encourage pt in therapies. MD has extended stay until 5/6. IV antibiotics complete and hopefully pt will be able to be a one person assist then.

## 2020-12-17 NOTE — Progress Notes (Signed)
Physical Therapy Session Note  Patient Details  Name: Daniel Cline MRN: [id number] Date of Birth: [birth year]  Today's Date: 12/17/2020 PT Individual Time: 1000-1030 and 1400-1443 PT Individual Time Calculation (min): 30 min  And 43 mins  Short Term Goals: Week 1:  PT Short Term Goal 1 (Week 1): Pt will tolerate sitting in appropriate wc x 1 hr to allow for participation in therapy session PT Short Term Goal 1 - Progress (Week 1): Met PT Short Term Goal 2 (Week 1): Pt will roll L/R w/mod assist and cues PT Short Term Goal 2 - Progress (Week 1): Met PT Short Term Goal 3 (Week 1): Pt will tolerate sitting on edge of bed/mat x 10 min w/occasional repositioning if needed and mod assist PT Short Term Goal 3 - Progress (Week 1): Not met Week 2:  PT Short Term Goal 1 (Week 2): pt to demonstrate upright sitting position at least ~75 degrees to increase safety with transfers and sitting tolerance PT Short Term Goal 1 - Progress (Week 2): Not met PT Short Term Goal 2 (Week 2): pt to demonstrate supine>sit mod A x1 PT Short Term Goal 2 - Progress (Week 2): Not met PT Short Term Goal 3 (Week 2): Pt will tolerate sitting on edge of bed/mat x 10 min w/occasional repositioning if needed and mod assist PT Short Term Goal 3 - Progress (Week 2): Not met Week 3:  PT Short Term Goal 1 (Week 3): pt to demonstrate upright sitting position at least ~75 degrees to increase safety with transfers and sitting tolerance PT Short Term Goal 2 (Week 3): pt to demonstrate supine>sit mod A x1 PT Short Term Goal 3 (Week 3): Pt will tolerate sitting on edge of bed/mat x 10 min w/occasional repositioning if needed and mod assist Week 4:     Skilled Therapeutic Interventions/Progress Updates:    session 1: PT present for family conference with OT, case management, MD and pt's son and daughter in law. Pt's overall current functional status, DC plan, equipment needs all discussed and pt voiced concerns of pt's pain levels  and insurance coverage. All concerns completed and needs met.    Session 2: pt received in bed and agreeable to therapy. rec therapist present to assist in session as well. Pt directed in supine>L side lying min A with greatly extra time, max  Ax2 for transfer to sitting EOB with pt able to control and mobilize BLE but all assist at trunk, pt greatly limited by pain. Pt tolerated sitting upright max A posteriorly for support at trunk and intermittent head control, VC for breathing technique and relaxation, 2 mins upright. Pt then max A x2 to return to supine with upward scooting. Pt required prolonged rest to recover 2/2 pain. Pt then directed in same transfer only to his R to attempt to allow him improved I with RUE to push into sitting mod A x2 to complete to this direction with pt able to sit max A for support for 1 mins. Pt returned to supine, max A x2. Left in supine, All needs in reach and in good condition. Call light in hand.  And alarm set. Pt requesting pain medication at end of session, nursing present to complete.   Therapy Documentation Precautions:  Precautions Precautions: Cervical,Fall Required Braces or Orthoses: Cervical Brace Cervical Brace: Soft collar,For comfort Restrictions Weight Bearing Restrictions: No General:   Vital Signs: Therapy Vitals Temp: 98.3 F (36.8 C) Temp Source: Oral Pulse Rate: 79 Resp: 16  BP: (!) 96/57 Patient Position (if appropriate): Lying Oxygen Therapy SpO2: 98 % O2 Device: Room Air Pain:   Mobility:   Locomotion :    Trunk/Postural Assessment :    Balance:   Exercises:   Other Treatments:      Therapy/Group: Individual Therapy  [name] 12/17/2020, 4:05 PM

## 2020-12-17 NOTE — Progress Notes (Signed)
Occupational Therapy Session Note  Patient Details  Name: Daniel Cline MRN: [id number] Date of Birth: 1956-01-11  Today's Date: 12/17/2020 OT Individual Time: 15 min, 26 min OT Individual Time Calculations: 1000-1030 (session 1 family conference), 1130-1156  Missed 45 mins d/t off unit for imaging  Short Term Goals: Week 2:  OT Short Term Goal 1 (Week 2): Pt will improve R hand grip strength to 20# to perform with self feeding/grooming with Supervision OT Short Term Goal 1 - Progress (Week 2): Progressing toward goal OT Short Term Goal 2 (Week 2): Pt will transition supine > sit w/ Max of 1 in prep for ADL OT Short Term Goal 2 - Progress (Week 2): Met OT Short Term Goal 3 (Week 2): Pt will roll L/R with Max A of 1 to assist with bed level ADL OT Short Term Goal 3 - Progress (Week 2): Met OT Short Term Goal 4 (Week 2): Pt will tolerate HOB >50* elevation for 5+ minutes during IADL/ADL OT Short Term Goal 4 - Progress (Week 2): Progressing toward goal OT Short Term Goal 5 (Week 2): Pt will thread pants bed level with reacher Mod A OT Short Term Goal 5 - Progress (Week 2): Progressing toward goal Week 3:  OT Short Term Goal 1 (Week 3): STGs = LTGs d/t ELOS  Skilled Therapeutic Interventions/Progress Updates:    Session 1: Pt greeted at time of session supine in bed semireclined resting, son and daughter in law present throughout for family conference with MD, SW, and PT. Focus of session on CLOF, functional limitations, pain management, and DC planning. Family verbalized understanding.   Session 2: Pt greeted at time of session semireclined in bed resting agreeable to OT session, family not present. Focus on ADL changing clothes as PICC line not running and nursing staff just completed cathing. UB dressing Max A, LB dressing total A with pt rolling L/R throughout with Min A to L side and Mod A to R side using bed rails and assist to manage trunk and LEs. Utilized hemi technique for UB  dressing with LUE first d/t pain and then head, RUE last d/t pain limitations and this worked well. Attempted to help thread with HOB elevated and bringing feet up to self, needed assist with threading but able to bring up to hips. Pt semireclined with alarm on call bell in reach.   Session 3: Missed 45 mins of OT as pt was off unit for imaging. Will continue to follow.   Therapy Documentation Precautions:  Precautions Precautions: Cervical,Fall Required Braces or Orthoses: Cervical Brace Cervical Brace: Soft collar,For comfort Restrictions Weight Bearing Restrictions: No     Therapy/Group: Individual Therapy  Viona Gilmore 12/17/2020, 7:14 AM

## 2020-12-18 DIAGNOSIS — R52 Pain, unspecified: Secondary | ICD-10-CM

## 2020-12-18 LAB — GLUCOSE, CAPILLARY
Glucose-Capillary: 129 mg/dL — ABNORMAL HIGH (ref 70–99)
Glucose-Capillary: 199 mg/dL — ABNORMAL HIGH (ref 70–99)
Glucose-Capillary: 153 mg/dL — ABNORMAL HIGH (ref 70–99)
Glucose-Capillary: 179 mg/dL — ABNORMAL HIGH (ref 70–99)

## 2020-12-18 MED ORDER — DIAZEPAM 2 MG PO TABS
1.0000 mg | ORAL_TABLET | Freq: Two times a day (BID) | ORAL | Status: DC
  Administered 2020-12-18 – 2021-01-01 (×28): 1 mg via ORAL
  Filled 2020-12-18 (×28): qty 1

## 2020-12-18 NOTE — Progress Notes (Signed)
PROGRESS NOTE   Subjective/Complaints: Sleepy Cervical spine XR is stable.  Vital signs stable.  Appreciate neuropsych eval.   ROS:  Pt denies SOB, abd pain, CP, and vision changes, +nausea and vomiting, +neck pain  Objective:   DG Cervical Spine 2 or 3 views  Result Date: 12/17/2020 CLINICAL DATA:  Pain EXAM: CERVICAL SPINE - 2-3 VIEW COMPARISON:  CT 11/29/2020 FINDINGS: Straightening of the cervical spine. Poor visualization C5 and below. Partially visualized laminectomy changes at C3-C4 and C5. The dens and lateral masses are within normal limits. The prevertebral soft tissue thickness is normal. There is disc space narrowing at C5-C6. IMPRESSION: Poor visualization C5 and below. Straightening of the cervical spine with degenerative change at C5-C6. Partially visualized laminectomy changes at C3 through the lower cervical spine. Electronically Signed   By: Donavan Foil M.D.   On: 12/17/2020 17:41   No results for input(s): WBC, HGB, HCT, PLT in the last 72 hours. No results for input(s): NA, K, CL, CO2, GLUCOSE, BUN, CREATININE, CALCIUM in the last 72 hours.  Intake/Output Summary (Last 24 hours) at 12/18/2020 1234 Last data filed at 12/18/2020 0900 Gross per 24 hour  Intake 380 ml  Output 1050 ml  Net -670 ml        Physical Exam: Vital Signs Blood pressure 119/71, pulse 87, temperature 97.7 F (36.5 C), temperature source Oral, resp. rate 18, height '5\' 7"'  (1.702 m), weight 76.7 kg, SpO2 95 %. Gen: no distress, normal appearing HEENT: oral mucosa pink and moist, NCAT Cardio: Reg rate Chest: normal effort, normal rate of breathing Abd: soft, non-distended Ext: no edema Psych: pleasant, normal affect Musculoskeletal:     Comments: LUE-strength in his Left arm is 4-/5-in the deltoid, bicep, tricep, wrist extension, grip, and finger abduction RUE-deltoid bicep and tricep are 3/5, wrist extension and grip are 3-/5,  LLE-4/5 in hip flexion, knee extension, and dorsiflexion as well as plantarflexion RLE-strength is 4-/5-in the same muscles TTP over L shoulder and L upper trap- muscles feel pretty loose.  Not assessed today Skin:    General: Skin is warm and dry.     Comments:  PICC line in his left upper extremity looks good  Neurological:  He reports his light touch is intact in all 4 extremities He has no lower extremity clonus bilaterally  Assessment/Plan: 1. Functional deficits which require 3+ hours per day of interdisciplinary therapy in a comprehensive inpatient rehab setting.  Physiatrist is providing close team supervision and 24 hour management of active medical problems listed below.  Physiatrist and rehab team continue to assess barriers to discharge/monitor patient progress toward functional and medical goals  Care Tool:  Bathing  Bathing activity did not occur: Safety/medical concerns (Per OT eval on medical hold) Body parts bathed by patient: Chest,Face,Left arm,Abdomen,Left upper leg,Right upper leg   Body parts bathed by helper: Front perineal area,Buttocks,Left upper leg,Right lower leg,Left lower leg,Right arm,Right upper leg     Bathing assist Assist Level: Total Assistance - Patient < 25%     Upper Body Dressing/Undressing Upper body dressing   What is the patient wearing?: Pull over shirt    Upper body assist Assist Level: Maximal  Assistance - Patient 25 - 49%    Lower Body Dressing/Undressing Lower body dressing      What is the patient wearing?: Pants     Lower body assist Assist for lower body dressing: Dependent - Patient 0%     Toileting Toileting    Toileting assist Assist for toileting: Dependent - Patient 0% (per staff  report, declined at eval. also limited by pain) Assistive Device Comment: foley   Transfers Chair/bed transfer  Transfers assist  Chair/bed transfer activity did not occur: Refused  Chair/bed transfer assist level: Dependent -  Patient 0% (lateral slide)     Locomotion Ambulation   Ambulation assist   Ambulation activity did not occur: Safety/medical concerns          Walk 10 feet activity   Assist  Walk 10 feet activity did not occur: Safety/medical concerns        Walk 50 feet activity   Assist Walk 50 feet with 2 turns activity did not occur: Safety/medical concerns         Walk 150 feet activity   Assist Walk 150 feet activity did not occur: Safety/medical concerns         Walk 10 feet on uneven surface  activity   Assist Walk 10 feet on uneven surfaces activity did not occur: Safety/medical concerns         Wheelchair     Assist Will patient use wheelchair at discharge?: Yes Type of Wheelchair: Manual Wheelchair activity did not occur: Refused  Wheelchair assist level: Dependent - Patient 0%      Wheelchair 50 feet with 2 turns activity    Assist    Wheelchair 50 feet with 2 turns activity did not occur: Refused   Assist Level: Dependent - Patient 0%   Wheelchair 150 feet activity     Assist  Wheelchair 150 feet activity did not occur: Refused   Assist Level: Dependent - Patient 0%   Blood pressure 119/71, pulse 87, temperature 97.7 F (36.5 C), temperature source Oral, resp. rate 18, height '5\' 7"'  (1.702 m), weight 76.7 kg, SpO2 95 %.  Medical Problem List and Plan: 1.  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  Acute DVT in peroneal/soleus: eliquis restarted 3. Pain Management: Patient is already receiving oxycodone as needed; we started tramadol scheduled but that is not working; will DC tramadol and schedule MS Contin 15 mg twice daily  - on Valium and Firoicet prn per NSU  D/c cymbalta which  may be causing emesis.   Continue MS Contin as pain and ability to participate in therapy limited by pain. XR cervical spine stable, soft collar ordered.  4. Mood: - Messaged SW regarding adding Mr. Encinas to Neuropsychologyschedule due to uncontrolled pain and new SCI.              -antipsychotic agents: N/A 5. Neuropsych: This patient is capable of making decisions on his own behalf. 6. Skin/Wound Care: We will change his honeycomb dressing on his neck to daily and as needed; will also change the dressing on his right forearm to just a dry dressing and change  as needed. Less drainage today, will not require I&D  4/22 minimal drainage, almost healed incision- continue follow/monitor 7. Fluids/Electrolytes/Nutrition: We will check labs on Monday 8. Uncontrolled Diabetes II- A1c of 13.9-continue patient's Levemir 10 units nightly and sliding scale insulin  4/22- CBGs 129-199: continue Levemir 16 units nightly. D/c ensure 9. Hypertension hx with Orthostatic hypotension-patient is on HCTZ 25 mg daily as well as Norvasc 10 mg daily and lisinopril 40 mg daily and Lopressor 50 mg twice daily-blood pressure is well controlled we will continue these medications and monitor especially with increased activity  4/15: Have reduced Lisinopril, HCTZ, Norvasc and con't Lopressor- still having episode of orthostatic hypotension- might need additional reduction- don't decreased B Blocker due to tachycardia frequently.   4/16: now hypotensive: decrease lisinopril to 373m.  Vitals:   12/17/20 1931 12/18/20 0406  BP: 99/60 119/71  Pulse: 91 87  Resp: 16 18  Temp: 99.5 F (37.5 C) 97.7 F (36.5 C)  SpO2: 95% 95%    10.  Neurogenic bowel-we will look to start him on a bowel program nightly after dinner with Dulcolax suppository  4/15- KUB showed diffuse constipation- will give Sorbitol 60 ml to clean him out   12.  Acute DVT in the soleus and peroneal-we will change his Lovenox to apixaban/Eliquis 10 mg BID x 1  week, then 5 mg BID.  13.  Neurogenic bladder-we will continue his Foley for now and start Flomax tomorrow 0.4 mg q. supper and see if he is able to start to void;  we might need to titrate this as required- will try and remove foley next week.   4/14- still requiring caths- if doesn't have results, soon, will likely need foley to go home with.   4/15- voiding some, but on Max dose of Flomax- might need Foley if going to SNF or family wants it 115 Hypokalemia: supplement 48mBID today and repeat BMP tomorrow  4/8- K+ 3.5- borderline low- will recheck Monday  4/11- K+ 3.3 will replete 40 mEq x2 and recheck Thursday  15. Epidural abscess-  4/5- on  Cefazolin IV for prolonged IV ABX- pt's WBC down to 15.5 (was 14k yesterday, but 20.8 3 days ago)- low grade temps up to 100.2 in last 24 hours-will con't regimen   4/6-White count is down slightly to 14K-we will continue IV antibiotics and check CRP and ESR-weekly  4/7- afebrile- will recheck labs in AM  4/8- WBC stable- con't IV ABX.   4/11- WBC down to 11.3- ESR >140 and CRP 10.9- heading a little better- con't to monitor.   4/15 WBC 9.8, monitor weekly 16. Dysphagia?  D2 diet due to lack of teeth, not due to dysphagia- doesn't need pills crushed. 17. Vomiting: d/c cymbalta which may be contributing. IV zofran. KUB ordered and shows no abdominal pathology. Trial Reglan. D/c Iron.  18. Atelectasis: Recommend incentive spirometer to improve breathing strength and ability over time. Provided incentive spirometer education. 19. Lethargy: decrease valium to 73m44mID.  20. Disposition: family meeting today   LOS: 20 days A FACE TO FACE EVALUATION WAS PERNomaulkar 12/18/2020, 12:34 PM

## 2020-12-18 NOTE — Consult Note (Signed)
Neuropsychological Consultation   Patient:   Daniel Cline   DOB:   11-14-1955  MR Number:  [id number]  Location:  [facility] [address] CENTER A [address] [id number] [place] [address] Dept: [phone] Loc: [phone]           Date of Service:   12/18/2020  Start Time:   10 AM End Time:   11 AM  Provider/Observer:  Arley Phenix, Psy.D.       Clinical Neuropsychologist       Billing Code/Service: [id number]  Chief Complaint:    Daniel Cline is a 65 year old male with history of uncontrolled type 2 diabetes, history of hypertension that was recently admitted to the hospital and found to have cervical spine epidural abscess.  Patient was taken emergently to the OR and had a laminectomy and abscess evacuation on mission.  He was also found to have right forearm abscess and was also drained by I&D by orthopedic surgery.  Patient seen by infectious disease to abscesses growing MSSA.  Patient placed on IV antibiotics.  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  Due to spinal cord injury and neurogenic bowel and bladder and reduced mobility patient was admitted to the inpatient rehab for CIR.  Reason for Service:  Patient has had a great deal of difficulty with pain and anxiety associated with fear of causing further injury when moving around.  Patient was referred for neuropsychological consultation due to coping and adjustment.  Below see HPI for the current admission.  HPI: Patient is a 65 year old male with a history of uncontrolled type 2 diabetes (hemoglobin A1c of 13.9), as well as a history of hypertension, who was admitted to the hospital and found to have a cervical spine epidural abscess.  He was taken emergently to the OR and had a laminectomy and abscess evacuation on admission.  He also was found to have a right forearm abscess that was also drained by I&D by  orthopedic surgery.  He was seen by infectious disease due to his abscesses growing out MSSA.  He is on IV antibiotics and is on Ancef to complete a 6 weeks course.  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  Due to that and neurogenic bowel and bladder, and his impaired level of mobility, as well as his severe uncontrolled pain, he was felt to be appropriate to admit for inpatient rehab at [facility].  Current Status:  Patient and daughter were present in the room with patient laying back in his bed slightly elevated.  Patient on IV antibiotics.  Patient with movement in his legs and right arm and some limitations in movement with his left arm.  Patient acknowledges a great deal of anxiety particularly upon first presentation to the rehab program as pain triggered significant fear and anxiety about further injury.  While the patient is stable and is cleared for therapies he continued with a lot of anxiety.  Patient's daughter acknowledges this fear and anxiety with acute onset of pain with movement.  Patient has been making some functional gains but it is been slow in recovery.  He has been extended another week due to slow progress in recovery.  Behavioral Observation: Daniel Cline  presents as a 65 y.o.-year-old Right Caucasian Male who appeared his stated age. his dress was Appropriate and he was Well Groomed  and his manners were Appropriate to the situation.  his participation was indicative of Appropriate and Redirectable behaviors.  There were physical disabilities noted as well as hard of hearing.  he displayed an appropriate level of cooperation and motivation.     Interactions:    Active Appropriate and Redirectable  Attention:   abnormal and attention span appeared shorter than expected for age  Memory:   abnormal; remote memory intact, recent memory impaired  Visuo-spatial:  not examined  Speech  (Volume):  low  Speech:   normal; slurred  Thought Process:  Coherent and Relevant  Though Content:  WNL; not suicidal and not homicidal  Orientation:   person, place and time/date  Judgment:   Fair  Planning:   Poor  Affect:    Anxious  Mood:    Anxious  Insight:   Fair  Intelligence:   low   Medical History:   Past Medical History:  Diagnosis Date  . Diabetes mellitus without complication [facility])          Patient Active Problem List   Diagnosis Date Noted  . Pain   . Protein-calorie malnutrition, severe 12/17/2020  . Neurogenic bowel 11/29/2020  . Neurogenic bladder 11/29/2020  . Cervical myelopathy (HCC) 11/28/2020  . Slow transit constipation   . Normocytic anemia   . Uncontrolled type 2 diabetes mellitus with hyperglycemia (HCC)   . Benign essential HTN   . HTN (hypertension) 11/24/2020  . Staph aureus infection 11/20/2020  . Pressure injury of skin 11/19/2020  . Hypertension associated with diabetes (HCC)   . Abscess 11/18/2020  . 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  Family Med/Psych History: History reviewed. No pertinent family history.   Impression/DX:  HPI: Patient is a 65 year old male with a history of uncontrolled type 2 diabetes (hemoglobin A1c of 13.9), as well as a history of hypertension, who was admitted to the hospital and found to have a cervical spine epidural abscess.  He was taken emergently to the OR and had a laminectomy and abscess evacuation on admission.  He also was found to have a right forearm abscess that was also drained by I&D by orthopedic surgery.  He was seen by infectious disease due to his abscesses growing out MSSA.  He is on IV antibiotics and is on Ancef to complete  a 6 weeks course.  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  Due to that and neurogenic bowel and bladder, and his impaired level of mobility, as well as his severe uncontrolled pain, he was felt to be appropriate to admit for inpatient rehab at [facility].  Patient and daughter were present in the room with patient laying back in his bed slightly elevated.  Patient on IV antibiotics.  Patient with movement in his legs and right arm and some limitations in movement with his left arm.  Patient acknowledges a great deal of anxiety particularly upon first presentation to the rehab program as pain triggered significant fear and anxiety about further injury.  While the patient is stable and is cleared for therapies he continued with a lot of anxiety.  Patient's daughter acknowledges this fear and anxiety with acute onset of pain with movement.  Patient has  been making some functional gains but it is been slow in recovery.  He has been extended another week due to slow progress in recovery.  Disposition/Plan:  Today we worked on coping and adjustment issues particular around his anxiety and worked on how he could better manage the intrusive thoughts and fears that he has when participating in therapeutic interventions.  The patient reports that it has been improving and he is actually been working on some of his mood particularly with his right arm between therapy sessions.  Diagnosis:    Abdominal pain - Plan: DG Abd 1 View, DG Abd 1 View  Nausea & vomiting - Plan: DG Abd 1 View, DG Abd 1 View, DG Chest 2 View, DG Chest 2 View  Pain - Plan: DG Cervical Spine 2 or 3 views, DG Cervical Spine 2 or 3 views, CANCELED: DG Cervical Spine 2-3Vclearing, CANCELED: DG Cervical Spine 2-3Vclearing         Electronically Signed   _______________________ Arley Phenix, Psy.D. Clinical Neuropsychologist

## 2020-12-18 NOTE — Progress Notes (Signed)
Occupational Therapy Session Note  Patient Details  Name: Daniel Cline MRN: [id number] Date of Birth: [birth year]  Today's Date: 12/18/2020 OT Individual Time: [phone] and [phone] OT Individual Time Calculation (min): 71 min and 59 min  Short Term Goals: Week 3:  OT Short Term Goal 1 (Week 3): STGs = LTGs d/t ELOS  Skilled Therapeutic Interventions/Progress Updates:    Pt greeted in bed, when asked if he wanted to sit up, pt stating "I'll try." +2 Total A for supine<sit. Note that his brief was clean prior to mobility. Pt unable to tolerate sitting up for ~5 seconds before demanding to lie back down due to pain. Pt reported having the most pain in his entire back, requesting for therapist and NT to not touch his back when sitting up, but due to significant posterior lean/LOB, unable to fulfill this request for his safety. +2 for returning to bed, adjusting chuck pad, and boosting up in bed. Pt reported his pain was "10/10" in neck/upper back. RN notified to provide pain medicine and while we waited, OT guided pt through holistic pain mgt strategies including diaphragmatic breathing exercises with hands on belly and use of aromatherapy interventions, gentle neck stretches with demonstrational/manual cues (forward/backward shoulder rolls, shoulder shrugs, scapular pinches), and gentle B UE AAROM exercises while listening to meaningful music. Pt only able to tolerate elbow flexion/extension with his Lt UE due to shoulder pain. Incorporated cognitive challenges by having pt sing along to familiar music during session. Pt after reported that his pain was "8/10." He progressed from tolerating 11 degrees of HOB elevation to 18 degrees. When OT first arrived he was at 40 degrees which he reported was too much, asking to be lowered to 11 degrees. Supervision/cuing for washing hands with sanitizer and then applying lotion to hands/forearms. Pt remained in bed at close of session, all needs within reach  and bed alarm set, left in care of RN to receive his pain medicine.   2nd Session 1:1 tx (59 min) Pt greeted in bed, agreeable to try to sit up again. Per RN, pt premedicated for pain. C-collar not in room however we created a makeshift collar using the bathroom towel. +2 for supine<sit with pt assisting in regards to using the bedrail to pull himself into sidelying and also with moving LEs off of the bed. Pt achieved supine<incomplete sit, pt leaning backwards in a reclined position due to back pain/inability to tolerate full upright posture. He requested to return in bed in <10 seconds of sitting in partial sit. Once he was returned to bed, +2 for maxi slide transfer to the tilt table. Worked on upright tolerance/LE weightbearing while increasing degrees of tilt from 25 to 32 to 40 degrees. BP 101/66, 103/63, and 87/63 respectively. Pt reported a little lightheadedness/nausea when positioned at 40 degrees. After 40 degrees he was returned to bed using the maxi slides. Increased initiation when assisting with bed mobility/boosting up. Pt remained comfortably in bed at close of session, all needs within reach and bed alarm set. Pt reported decrease in nausea/lightheadedness but still present. Notified RN.   Note that while pt was using the tilt table we played meaningful music. Pt tapped his toes and moved his Rt UE in beat to music while trying to sing along to these songs, affect visibly bright.   Therapy Documentation Precautions:  Precautions Precautions: Cervical,Fall Required Braces or Orthoses: Cervical Brace Cervical Brace: Soft collar,For comfort Restrictions Weight Bearing Restrictions: No ADL: ADL Eating: Not assessed  Grooming: Not assessed Upper Body Bathing: Maximal assistance Where Assessed-Upper Body Bathing: Bed level Lower Body Bathing: Dependent Where Assessed-Lower Body Bathing: Bed level Upper Body Dressing: Dependent Lower Body Dressing: Dependent Where Assessed-Lower  Body Dressing: Bed level Toileting: Not assessed Toilet Transfer: Not assessed Tub/Shower Transfer: Not assessed Walk-In Shower Transfer: Not assessed      Therapy/Group: Individual Therapy  Nelida Mandarino A Malyn Aytes 12/18/2020, 12:43 PM

## 2020-12-19 LAB — GLUCOSE, CAPILLARY
Glucose-Capillary: 131 mg/dL — ABNORMAL HIGH (ref 70–99)
Glucose-Capillary: 122 mg/dL — ABNORMAL HIGH (ref 70–99)
Glucose-Capillary: 111 mg/dL — ABNORMAL HIGH (ref 70–99)
Glucose-Capillary: 103 mg/dL — ABNORMAL HIGH (ref 70–99)

## 2020-12-19 MED ORDER — INSULIN DETEMIR 100 UNIT/ML ~~LOC~~ SOLN
18.0000 [IU] | Freq: Every day | SUBCUTANEOUS | Status: DC
  Administered 2020-12-19 – 2020-12-31 (×13): 18 [IU] via SUBCUTANEOUS
  Filled 2020-12-19 (×14): qty 0.18

## 2020-12-19 NOTE — Progress Notes (Signed)
Bowel program initiated. Dig stim performed and suppository administered by this nurse. Patient had a large bowel movement.

## 2020-12-19 NOTE — Progress Notes (Addendum)
PROGRESS NOTE   Subjective/Complaints: Feeling a little sleepy this morning. Working on breakfast. No new complaints  ROS: Patient denies fever, rash, sore throat, blurred vision, nausea, vomiting, diarrhea, cough, shortness of breath or chest pain, headache, or mood change.   Objective:   DG Cervical Spine 2 or 3 views  Result Date: 12/17/2020 CLINICAL DATA:  Pain EXAM: CERVICAL SPINE - 2-3 VIEW COMPARISON:  CT 11/29/2020 FINDINGS: Straightening of the cervical spine. Poor visualization C5 and below. Partially visualized laminectomy changes at C3-C4 and C5. The dens and lateral masses are within normal limits. The prevertebral soft tissue thickness is normal. There is disc space narrowing at C5-C6. IMPRESSION: Poor visualization C5 and below. Straightening of the cervical spine with degenerative change at C5-C6. Partially visualized laminectomy changes at C3 through the lower cervical spine. Electronically Signed   By: Donavan Foil M.D.   On: 12/17/2020 17:41   No results for input(s): WBC, HGB, HCT, PLT in the last 72 hours. No results for input(s): NA, K, CL, CO2, GLUCOSE, BUN, CREATININE, CALCIUM in the last 72 hours.  Intake/Output Summary (Last 24 hours) at 12/19/2020 0941 Last data filed at 12/19/2020 0811 Gross per 24 hour  Intake 268 ml  Output 300 ml  Net -32 ml        Physical Exam: Vital Signs Blood pressure 120/80, pulse 86, temperature 98 F (36.7 C), temperature source Oral, resp. rate 18, height '5\' 7"'  (1.702 m), weight 76.7 kg, SpO2 96 %. Constitutional: No distress . Vital signs reviewed. HEENT: EOMI, oral membranes moist Neck: supple Cardiovascular: RRR without murmur. No JVD    Respiratory/Chest: CTA Bilaterally without wheezes or rales. Normal effort    GI/Abdomen: BS +, non-tender, non-distended Ext: no clubbing, cyanosis, or edema Psych: pleasant and cooperative Musculoskeletal:     Comments:  LUE-strength in his Left arm is 4-/5-in the deltoid, bicep, tricep, wrist extension, grip, and finger abduction RUE-deltoid bicep and tricep are 3/5, wrist extension and grip are 3-/5, LLE-4/5 in hip flexion, knee extension, and dorsiflexion as well as plantarflexion RLE-strength is 4-/5-in the same muscles TTP over L shoulder and L upper trap areas Skin:    General: Skin is warm and dry.     Comments:  PICC line in his left upper extremity looks good  Neurological:  He reports his light touch is intact in all 4 extremities He has no lower extremity clonus bilaterally  Assessment/Plan: 1. Functional deficits which require 3+ hours per day of interdisciplinary therapy in a comprehensive inpatient rehab setting.  Physiatrist is providing close team supervision and 24 hour management of active medical problems listed below.  Physiatrist and rehab team continue to assess barriers to discharge/monitor patient progress toward functional and medical goals  Care Tool:  Bathing  Bathing activity did not occur: Safety/medical concerns (Per OT eval on medical hold) Body parts bathed by patient: Chest,Face,Left arm,Abdomen,Left upper leg,Right upper leg   Body parts bathed by helper: Front perineal area,Buttocks,Left upper leg,Right lower leg,Left lower leg,Right arm,Right upper leg     Bathing assist Assist Level: Total Assistance - Patient < 25%     Upper Body Dressing/Undressing Upper body dressing   What  is the patient wearing?: Pull over shirt    Upper body assist Assist Level: Maximal Assistance - Patient 25 - 49%    Lower Body Dressing/Undressing Lower body dressing      What is the patient wearing?: Pants     Lower body assist Assist for lower body dressing: Dependent - Patient 0%     Toileting Toileting    Toileting assist Assist for toileting: Dependent - Patient 0% (per staff  report, declined at eval. also limited by pain) Assistive Device Comment: foley    Transfers Chair/bed transfer  Transfers assist  Chair/bed transfer activity did not occur: Refused  Chair/bed transfer assist level: Dependent - Patient 0% (lateral slide)     Locomotion Ambulation   Ambulation assist   Ambulation activity did not occur: Safety/medical concerns          Walk 10 feet activity   Assist  Walk 10 feet activity did not occur: Safety/medical concerns        Walk 50 feet activity   Assist Walk 50 feet with 2 turns activity did not occur: Safety/medical concerns         Walk 150 feet activity   Assist Walk 150 feet activity did not occur: Safety/medical concerns         Walk 10 feet on uneven surface  activity   Assist Walk 10 feet on uneven surfaces activity did not occur: Safety/medical concerns         Wheelchair     Assist Will patient use wheelchair at discharge?: Yes Type of Wheelchair: Manual Wheelchair activity did not occur: Refused  Wheelchair assist level: Dependent - Patient 0%      Wheelchair 50 feet with 2 turns activity    Assist    Wheelchair 50 feet with 2 turns activity did not occur: Refused   Assist Level: Dependent - Patient 0%   Wheelchair 150 feet activity     Assist  Wheelchair 150 feet activity did not occur: Refused   Assist Level: Dependent - Patient 0%   Blood pressure 120/80, pulse 86, temperature 98 F (36.7 C), temperature source Oral, resp. rate 18, height '5\' 7"'  (1.702 m), weight 76.7 kg, SpO2 96 %.  Medical Problem List and Plan: 1.  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  Acute DVT in peroneal/soleus: eliquis restarted 3. Pain Management: Patient is already receiving oxycodone as  needed; we started tramadol scheduled but that is not working; will DC tramadol and schedule MS Contin 15 mg twice daily  - on Valium and Firoicet prn per NSU  D/c cymbalta which may be causing emesis.   Continue MS Contin as pain and ability to participate in therapy limited by pain. XR cervical spine stable, soft collar ordered.  4. Mood: - Messaged SW regarding adding Mr. Tootle to Neuropsychologyschedule due to uncontrolled pain and new SCI.              -antipsychotic agents: N/A 5. Neuropsych: This patient is capable of making decisions on his own behalf. 6. Skin/Wound Care: We will change his honeycomb dressing on his neck  to daily and as needed; will also change the dressing on his right forearm to just a dry dressing and change as needed. Less drainage today, will not require I&D  4/23 minimal drainage, almost healed incision- continue follow/monitor 7. Fluids/Electrolytes/Nutrition: We will check labs on Monday 8. Uncontrolled Diabetes II- A1c of 13.9-continue patient's Levemir 10 units nightly and sliding scale insulin  4/23- CBGs bordereline. increase Levemir to 18 units nightly.   9. Hypertension hx with Orthostatic hypotension-patient is on HCTZ 25 mg daily as well as Norvasc 10 mg daily and lisinopril 40 mg daily and Lopressor 50 mg twice daily-blood pressure is well controlled we will continue these medications and monitor especially with increased activity  4/15: Have reduced Lisinopril, HCTZ, Norvasc and con't Lopressor- still having episode of orthostatic hypotension- might need additional reduction- don't decreased B Blocker due to tachycardia frequently.   4/23 improved control with current regimen Vitals:   12/18/20 2124 12/19/20 0348  BP: 110/63 120/80  Pulse: 85 86  Resp:  18  Temp:  98 F (36.7 C)  SpO2: 94% 96%    10.  Neurogenic bowel-we will look to start him on a bowel program nightly after dinner with Dulcolax suppository  4/15- KUB showed diffuse constipation-  will give Sorbitol 60 ml to clean him out   12.  Acute DVT in the soleus and peroneal-we will change his Lovenox to apixaban/Eliquis 10 mg BID x 1 week, then 5 mg BID.  13.  Neurogenic bladder-we will continue his Foley for now and start Flomax tomorrow 0.4 mg q. supper and see if he is able to start to void;  we might need to titrate this as required- will try and remove foley next week.   4/14- still requiring caths- if doesn't have results, soon, will likely need foley to go home with.   4/15- voiding some, but on Max dose of Flomax- might need Foley if going to SNF or family wants it 57. Hypokalemia: supplement 2m BID today and repeat BMP tomorrow  4/8- K+ 3.5- borderline low- will recheck Monday  4/11- K+ 3.3 will replete 40 mEq x2 and recheck Thursday  15. Epidural abscess-  4/5- on  Cefazolin IV for prolonged IV ABX- pt's WBC down to 15.5 (was 14k yesterday, but 20.8 3 days ago)- low grade temps up to 100.2 in last 24 hours-will con't regimen   4/6-White count is down slightly to 14K-we will continue IV antibiotics and check CRP and ESR-weekly  4/7- afebrile- will recheck labs in AM  4/8- WBC stable- con't IV ABX.   4/11- WBC down to 11.3- ESR >140 and CRP 10.9- heading a little better- con't to monitor.   4/15 WBC 9.8, monitor weekly 16. Dysphagia?  D2 diet due to lack of teeth, not due to dysphagia- doesn't need pills crushed. 17. Vomiting: d/c cymbalta which may be contributing. IV zofran. KUB ordered and shows no abdominal pathology.   -4/23 improved off iron, reglan dc'ed  18. Atelectasis: Recommend incentive spirometer to improve breathing strength and ability over time. Provided incentive spirometer education. 19. Lethargy: decreased valium to 132mBID.      LOS: 21 days A FACE TO FACE EVALUATION WAS PERFORMED  ZaMeredith Staggers/23/2022, 9:41 AM

## 2020-12-19 NOTE — Progress Notes (Signed)
Physical Therapy Session Note  Patient Details  Name: Daniel Cline MRN: [id number] Date of Birth: 14-Apr-1956  Today's Date: 12/19/2020 PT Individual Time: 1500-1554 PT Individual Time Calculation (min): 54 min   Short Term Goals: Week 3:  PT Short Term Goal 1 (Week 3): pt to demonstrate upright sitting position at least ~75 degrees to increase safety with transfers and sitting tolerance PT Short Term Goal 2 (Week 3): pt to demonstrate supine>sit mod A x1 PT Short Term Goal 3 (Week 3): Pt will tolerate sitting on edge of bed/mat x 10 min w/occasional repositioning if needed and mod assist  Skilled Therapeutic Interventions/Progress Updates:   Pt received supine in bed and agreeable to PT. Supine therex:  Hip abduction  X 10 with manual resistance SLR. X 10  Hip/knee flexion/extension x 12 with manual resistance.   Hip adduction x10 with manual resitance.  Clam shells.  X 12 with manual resistance.  Ankle PF/DF. X 25  sidelying  clam shells x 10 Hip extension with AAROM. X 12   Rolling R and L with min assist x 2 each. Pt able to perform bridge to position pelvis for initiation of rolling. Moderate cues of bed rails to achieve   Attempted to use bed to raise into sitting position. Able to achieve 30 deg, before pt adamant that he return to supine due to L shoulder pain. Radiating from cspine. RN notified and administered pain meds. Pt repositioned in bed to attempt to reduce pain without success.       Therapy Documentation Precautions:  Precautions Precautions: Cervical,Fall Required Braces or Orthoses: Cervical Brace Cervical Brace: Soft collar,For comfort Restrictions Weight Bearing Restrictions: No Vital Signs: Therapy Vitals Temp: 98.6 F (37 C) Temp Source: Oral Pulse Rate: 81 Resp: 17 BP: 106/65 Patient Position (if appropriate): Lying Oxygen Therapy SpO2: 96 % O2 Device: Room Air Pain: Pain Assessment Pain Score: 3     Therapy/Group: Individual  Therapy  [name] 12/19/2020, 6:10 PM

## 2020-12-19 NOTE — Progress Notes (Signed)
Occupational Therapy Session Note  Patient Details  Name: Daniel Cline MRN: [id number] Date of Birth: [birth year]  Today's Date: 12/19/2020 OT Individual Time: 1015-1057 and [phone] OT Individual Time Calculation (min): 42 min and 40 min   Short Term Goals: Week 3:  OT Short Term Goal 1 (Week 3): STGs = LTGs d/t ELOS  Skilled Therapeutic Interventions/Progress Updates:    Pt greeted in bed with c/o neck pain. Rest breaks and activity modifications utilized to address. We also played meaningful music as a means of therapeutic pain distraction. Pt able to accurately state to therapist that his brief was clean at start of session. Started with lotion application/donning Teds with focus placed on pt following instruction and raising each LE against gravity to assist. Min A to thread each LE into pants with pt able to draw feet close to trunk! Able to complete small bridges to assist with elevation of pants however pt needed to ultimately roll Rt>Lt (Mod A) for OT to pull them up completely. Tried semi reclined<long sit with +2 assist however pt unable to complete due to pain. He rolled Rt>Lt to doff shirt, then Mod A to don shirt bedlevel with good effort from pt. He actively assisted the Lt UE when OT applied lotion to underarm with no s/s increased pain! Pt boosted up in bed x2 during session with +2 assist however pt actively assisting pushing through feet and pulling up using the Rt upper bedrail. At end of session pt remained in bed with all needs within reach and bed alarm set. Tx focus placed on ADL retraining, general strengthening/endurance, actively tolerance, and holistic pain mgt.   2nd Session 1:1 tx (40 min) Pt greeted in bed, agreeable to session. +2 for boosting pt up in bed with pt actively assisting by pushing through feet and pulling up with the upper Rt bedrail. Worked on LE strengthening/ROM by guiding him through exercises using the leg lifter. Manual cues for terminal knee  extension during prolonged hamstring stretches x20-30 seconds x2 sets. Pt also completing straight leg raises, hip adduction/abduction, and heel slides until reaching the point of fatigue. Exceptional participation noted today. We listened to meaningful music as a therapeutic means for pain mgt, also took some rest breaks. Notified RN of pts request for pain medicine. At end of session pt remained comfortably in bed, left him with all needs within reach and bed alarm set.    Therapy Documentation Precautions:  Precautions Precautions: Cervical,Fall Required Braces or Orthoses: Cervical Brace Cervical Brace: Soft collar,For comfort Restrictions Weight Bearing Restrictions: No ADL: ADL Eating: Not assessed Grooming: Not assessed Upper Body Bathing: Maximal assistance Where Assessed-Upper Body Bathing: Bed level Lower Body Bathing: Dependent Where Assessed-Lower Body Bathing: Bed level Upper Body Dressing: Dependent Lower Body Dressing: Dependent Where Assessed-Lower Body Dressing: Bed level Toileting: Not assessed Toilet Transfer: Not assessed Tub/Shower Transfer: Not assessed Walk-In Shower Transfer: Not assessed     Therapy/Group: Individual Therapy  Padraig Nhan A Suha Schoenbeck 12/19/2020, 12:51 PM

## 2020-12-20 LAB — GLUCOSE, CAPILLARY
Glucose-Capillary: 146 mg/dL — ABNORMAL HIGH (ref 70–99)
Glucose-Capillary: 111 mg/dL — ABNORMAL HIGH (ref 70–99)
Glucose-Capillary: 158 mg/dL — ABNORMAL HIGH (ref 70–99)
Glucose-Capillary: 114 mg/dL — ABNORMAL HIGH (ref 70–99)

## 2020-12-20 NOTE — Progress Notes (Signed)
Physical Therapy Session Note  Patient Details  Name: Daniel Cline MRN: [id number] Date of Birth: Jan 23, 1956  Today's Date: 12/20/2020 PT Individual Time: 1345-1420 PT Individual Time Calculation (min): 35 min   Short Term Goals: Week 3:  PT Short Term Goal 1 (Week 3): pt to demonstrate upright sitting position at least ~75 degrees to increase safety with transfers and sitting tolerance PT Short Term Goal 2 (Week 3): pt to demonstrate supine>sit mod A x1 PT Short Term Goal 3 (Week 3): Pt will tolerate sitting on edge of bed/mat x 10 min w/occasional repositioning if needed and mod assist  Skilled Therapeutic Interventions/Progress Updates:    Pt received supine in bed, agreeable to PT session. Pt is mod A to don shorts at bed level, rolling L/R with mod A to pull pants up over hips. Sidelying to sitting EOB with total A x 2. Pt tolerates sitting EOB x 30 sec with total A needed for trunk control due to significant posterior pushing and assist needed to block knees due to pt sliding forwards off bed. Pt unable to follow cues to correct trunk posture to sitting with sitting balance, requests to lay back down due to pain. After supine rest break pt agreeable to sit up to EOB again. Pt transferred to EOB in similar manner as above. Pt only tolerates sitting up x 10 sec before pain in his posterior neck becomes too great and he requests to return to supine. Bedrails in place and HOB elevated to 45 degrees. Attempted to have pt pull himself into long-sitting position, pt attempts but unable to clear his trunk from bed and declines to attempt again. Education with patient regarding importance of sitting upright and improving sitting tolerance in order for him to be able to spend time OOB and return home. Pt declines any further participation due to pain. Provided short-acting hot pack to R posterior neck region for pain management. Pt left semi-reclined in bed with needs in reach, bed alarm in place.  Pt missed 25 min of scheduled therapy session due to pain.  Therapy Documentation Precautions:  Precautions Precautions: Cervical,Fall Required Braces or Orthoses: Cervical Brace Cervical Brace: Soft collar,For comfort Restrictions Weight Bearing Restrictions: No General: PT Amount of Missed Time (min): 25 Minutes PT Missed Treatment Reason: Pain    Therapy/Group: Individual Therapy   Peter Congo, PT, DPT, CSRS  12/20/2020, 2:22 PM

## 2020-12-20 NOTE — Progress Notes (Signed)
Occupational Therapy Session Note  Patient Details  Name: ALEXA GOLEBIEWSKI MRN: [id number] Date of Birth: 1955/10/30  Today's Date: 12/20/2020 OT Individual Time: 450 450 [id number] OT Individual Time Calculation (min): 58 min   Short Term Goals: Week 3:  OT Short Term Goal 1 (Week 3): STGs = LTGs d/t ELOS  Skilled Therapeutic Interventions/Progress Updates:    Pt greeted in bed, drinking some of his orange juice and able to tolerate HOB raised only to 24 degrees to drink. He was agreeable to engage in bathing/dressing tasks, only able to tolerate HOB raised to 25 degrees at max during participation. Mod A to wash/dress UB. Pt able to lift Lt LE into figure 4 position to wash his foot with Min A, able to lift Rt LE into reclined figure 4 position with cues alone. Pt also able to bridge and doff his shorts with increased time and supervision! Rolling Rt>Lt completed for perihygiene/brief change. Pt at this time c/o increased nausea and wanted to stop activity. Left pt comfortably in bed with all needs within reach and bed alarm set, notified RN of his request for antinausea medicine.   Therapy Documentation Precautions:  Precautions Precautions: Cervical,Fall Required Braces or Orthoses: Cervical Brace Cervical Brace: Soft collar,For comfort Restrictions Weight Bearing Restrictions: No Vital Signs: Therapy Vitals Temp: 98.4 F (36.9 C) Temp Source: Oral Pulse Rate: 76 Resp: 16 BP: 101/62 Patient Position (if appropriate): Lying Oxygen Therapy SpO2: 97 % O2 Device: Room Air   ADL: ADL Eating: Not assessed Grooming: Not assessed Upper Body Bathing: Maximal assistance Where Assessed-Upper Body Bathing: Bed level Lower Body Bathing: Dependent Where Assessed-Lower Body Bathing: Bed level Upper Body Dressing: Dependent Lower Body Dressing: Dependent Where Assessed-Lower Body Dressing: Bed level Toileting: Not assessed Toilet Transfer: Not assessed Tub/Shower Transfer: Not  assessed Walk-In Shower Transfer: Not assessed      Therapy/Group: Individual Therapy  Lukisha Procida A Augie Vane 12/20/2020, 3:43 PM

## 2020-12-21 ENCOUNTER — Other Ambulatory Visit: Payer: Self-pay

## 2020-12-21 ENCOUNTER — Inpatient Hospital Stay (HOSPITAL_COMMUNITY): Payer: Medicare Other

## 2020-12-21 LAB — CBC WITH DIFFERENTIAL/PLATELET
Abs Immature Granulocytes: 0.04 10*3/uL (ref 0.00–0.07)
Basophils Absolute: 0.1 10*3/uL (ref 0.0–0.1)
Basophils Relative: 1 %
Eosinophils Absolute: 0.3 10*3/uL (ref 0.0–0.5)
Eosinophils Relative: 3 %
HCT: 33.9 % — ABNORMAL LOW (ref 39.0–52.0)
Hemoglobin: 10.9 g/dL — ABNORMAL LOW (ref 13.0–17.0)
Immature Granulocytes: 0 %
Lymphocytes Relative: 19 %
Lymphs Abs: 2.1 10*3/uL (ref 0.7–4.0)
MCH: 30.7 pg (ref 26.0–34.0)
MCHC: 32.2 g/dL (ref 30.0–36.0)
MCV: 95.5 fL (ref 80.0–100.0)
Monocytes Absolute: 1.4 10*3/uL — ABNORMAL HIGH (ref 0.1–1.0)
Monocytes Relative: 13 %
Neutro Abs: 6.9 10*3/uL (ref 1.7–7.7)
Neutrophils Relative %: 64 %
Platelets: 345 10*3/uL (ref 150–400)
RBC: 3.55 MIL/uL — ABNORMAL LOW (ref 4.22–5.81)
RDW: 13.9 % (ref 11.5–15.5)
WBC: 10.8 10*3/uL — ABNORMAL HIGH (ref 4.0–10.5)
nRBC: 0 % (ref 0.0–0.2)

## 2020-12-21 LAB — COMPREHENSIVE METABOLIC PANEL
ALT: 6 U/L (ref 0–44)
AST: 16 U/L (ref 15–41)
Albumin: 2.6 g/dL — ABNORMAL LOW (ref 3.5–5.0)
Alkaline Phosphatase: 117 U/L (ref 38–126)
Anion gap: 7 (ref 5–15)
BUN: 15 mg/dL (ref 8–23)
CO2: 27 mmol/L (ref 22–32)
Calcium: 9.1 mg/dL (ref 8.9–10.3)
Chloride: 100 mmol/L (ref 98–111)
Creatinine, Ser: 0.79 mg/dL (ref 0.61–1.24)
GFR, Estimated: >60 mL/min (ref >60)
Glucose, Bld: 106 mg/dL — ABNORMAL HIGH (ref 70–99)
Potassium: 3.7 mmol/L (ref 3.5–5.1)
Sodium: 134 mmol/L — ABNORMAL LOW (ref 135–145)
Total Bilirubin: 0.6 mg/dL (ref 0.3–1.2)
Total Protein: 7.1 g/dL (ref 6.5–8.1)

## 2020-12-21 LAB — C-REACTIVE PROTEIN: CRP: 4.4 mg/dL — ABNORMAL HIGH (ref <1.0)

## 2020-12-21 LAB — GLUCOSE, CAPILLARY
Glucose-Capillary: 96 mg/dL (ref 70–99)
Glucose-Capillary: 129 mg/dL — ABNORMAL HIGH (ref 70–99)
Glucose-Capillary: 106 mg/dL — ABNORMAL HIGH (ref 70–99)
Glucose-Capillary: 190 mg/dL — ABNORMAL HIGH (ref 70–99)

## 2020-12-21 LAB — SEDIMENTATION RATE: Sed Rate: 101 mm/hr — ABNORMAL HIGH (ref 0–16)

## 2020-12-21 IMAGING — CR DG ABDOMEN 1V
2 series · 2 of 2 positions shown · non-contrast
Comparison: [DATE]

CLINICAL DATA: Nausea and vomiting for 4 days.

EXAM:
ABDOMEN - 1 VIEW

[abdomen kub (1 of 2)]
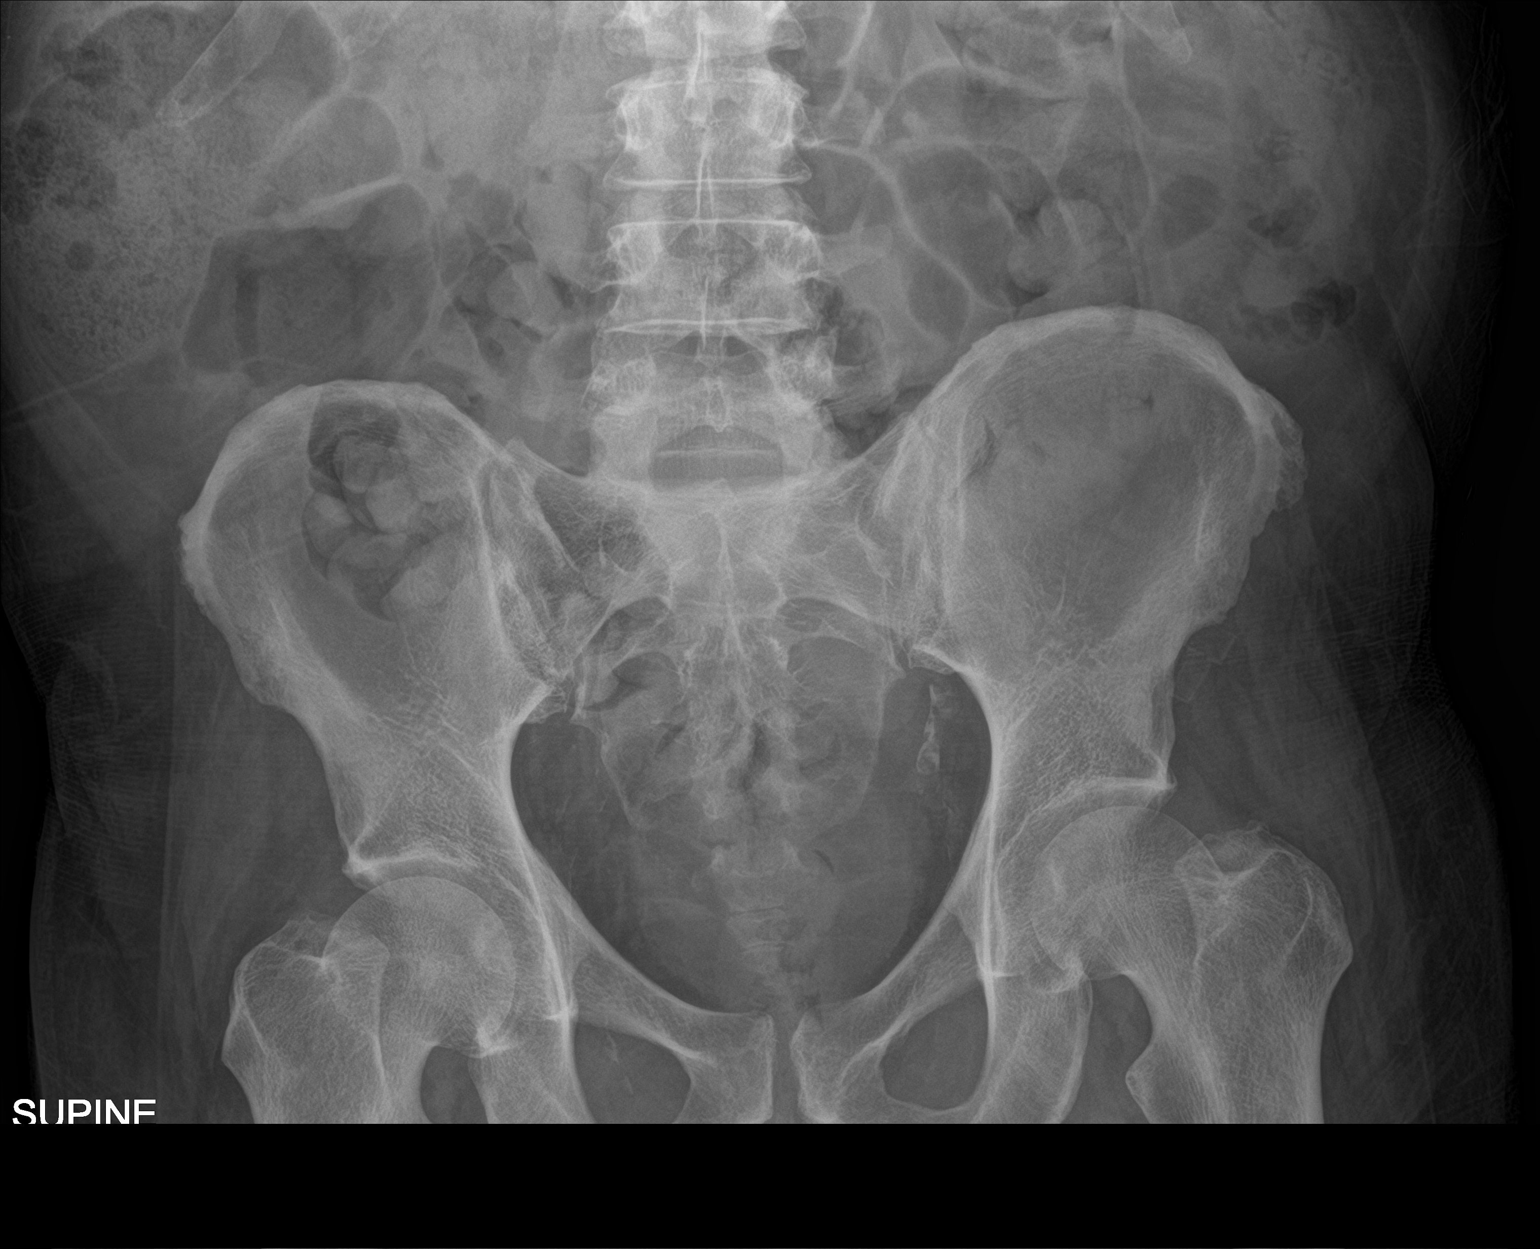

[abdomen kub (2 of 2)]
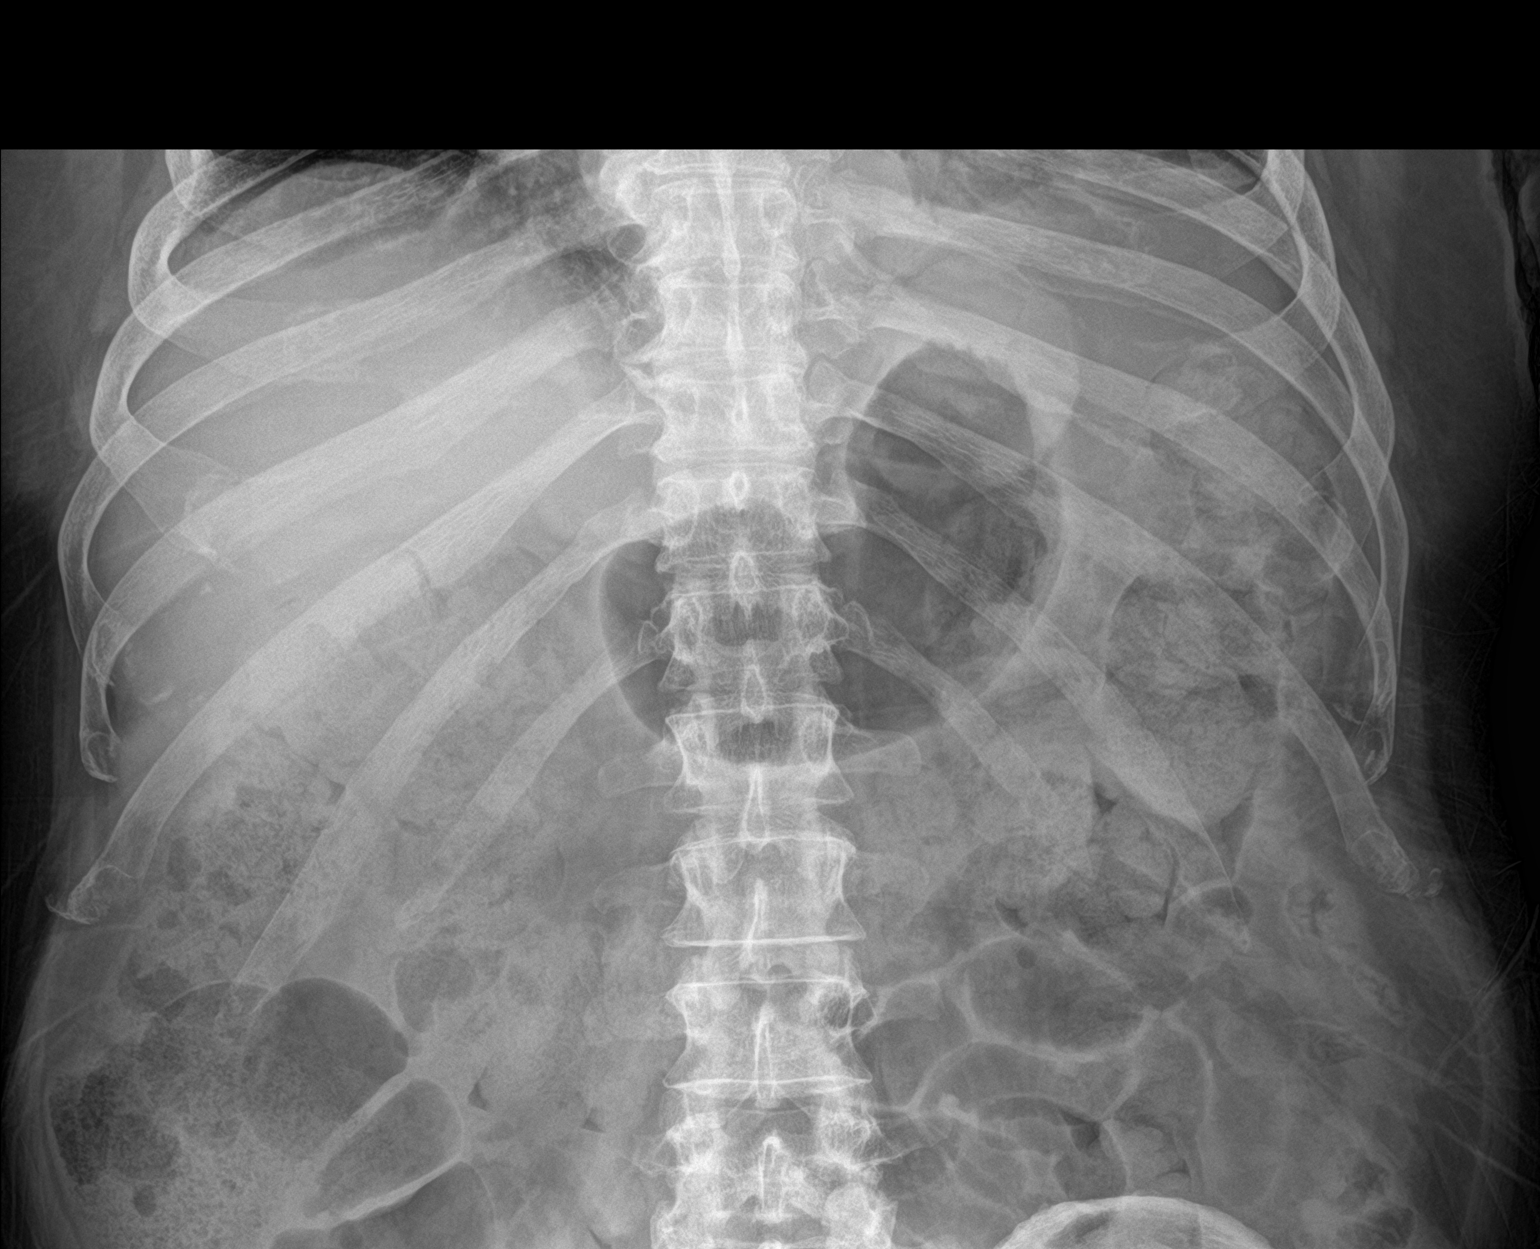

[2 of 2 positions shown; findings below may reference images not displayed]

FINDINGS: Moderate distension of the colon with stool down to the rectosigmoid
area. There also diffuse air-filled loops of small bowel without
significant distension. Findings most consistent with a diffuse
ileus and constipation.

Left basilar atelectasis noted.  No pleural effusions.

Vascular calcifications.

The bony structures are intact.
IMPRESSION: Diffuse ileus and constipation.

## 2020-12-21 MED ORDER — SORBITOL 70 % SOLN
60.0000 mL | Freq: Once | Status: AC
  Administered 2020-12-21: 60 mL via ORAL
  Filled 2020-12-21: qty 60

## 2020-12-21 MED ORDER — SENNOSIDES-DOCUSATE SODIUM 8.6-50 MG PO TABS
2.0000 | ORAL_TABLET | Freq: Two times a day (BID) | ORAL | Status: DC
  Administered 2020-12-21 – 2021-01-01 (×22): 2 via ORAL
  Filled 2020-12-21 (×25): qty 2

## 2020-12-21 MED ORDER — POLYETHYLENE GLYCOL 3350 17 G PO PACK
17.0000 g | PACK | Freq: Two times a day (BID) | ORAL | Status: DC
  Administered 2020-12-21 – 2021-01-01 (×21): 17 g via ORAL
  Filled 2020-12-21 (×22): qty 1

## 2020-12-21 NOTE — Consult Note (Signed)
Neuropsychological Consultation   Patient:   Daniel Cline   DOB:   1955/10/28  MR Number:  [id number]  Location:  [facility] [address] CENTER A [id number] [place] [address] Dept: [phone] Loc: [phone]           Date of Service:   12/21/2020  Start Time:   9 AM End Time:   9:45 AM  Provider/Observer:  Arley Phenix, Psy.D.       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  Patient was taken emergently to the OR and had a laminectomy and abscess evacuation on mission.  He was also found to have right forearm abscess and was also drained by I&D by orthopedic surgery.  Patient seen by infectious disease to abscesses growing MSSA.  Patient placed on IV antibiotics.  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  Due to spinal cord injury and neurogenic bowel and bladder and reduced mobility patient was admitted to the inpatient rehab for CIR.  Reason for Service:  Patient has had a great deal of difficulty with pain and anxiety associated with fear of causing further injury when moving around.  Patient was referred for neuropsychological consultation due to coping and adjustment.  Below see HPI for the current admission.  HPI: Patient is a 65 year old male with a history of uncontrolled type 2 diabetes (hemoglobin A1c of 13.9), as well as a history of hypertension, who was admitted to the hospital and found to have a cervical spine epidural abscess.  He was taken emergently to the OR and had a laminectomy and abscess evacuation on admission.  He also was found to have a right forearm abscess that was also drained by I&D  by orthopedic surgery.  He was seen by infectious disease due to his abscesses growing out MSSA.  He is on IV antibiotics and is on Ancef to complete a 6 weeks course.  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  Due to that and neurogenic bowel and bladder, and his impaired level of mobility, as well as his severe uncontrolled pain, he was felt to be appropriate to admit for inpatient rehab at [facility].  Current Status:  The patient was awake and alert but laying back in his bed.  Patient is hard of hearing so all communication was done of the level that he said he was able to hear.  Patient has not been engaged or active is much as we would hope in therapy with descriptions of pain.  He talked about sitting up on the edge of his bed today making him nauseated with some vomit but minimal.  Today most of the focus was on getting him to push himself more and his last days of therapy and explaining the difference between pain and risk of further harm.  Forceful encouragement was provided regarding his need to be more engaged and active in therapeutic interventions  Behavioral Observation: Daniel Cline  presents as a 65 y.o.-year-old Right Caucasian Male who appeared his stated age. his dress was Appropriate and he was Well Groomed and his manners were Appropriate  to the situation.  his participation was indicative of Appropriate and Redirectable behaviors.  There were physical disabilities noted as well as hard of hearing.  he displayed an appropriate level of cooperation and motivation.     Interactions:    Active Appropriate and Redirectable  Attention:   abnormal and attention span appeared shorter than expected for age  Memory:   abnormal; remote memory intact, recent memory impaired  Visuo-spatial:  not examined  Speech (Volume):  low  Speech:   normal; slurred  Thought Process:  Coherent and Relevant  Though  Content:  WNL; not suicidal and not homicidal  Orientation:   person, place and time/date  Judgment:   Fair  Planning:   Poor  Affect:    Anxious  Mood:    Anxious  Insight:   Fair  Intelligence:   low   Medical History:   Past Medical History:  Diagnosis Date  . Diabetes mellitus without complication [facility])          Patient Active Problem List   Diagnosis Date Noted  . Pain   . Protein-calorie malnutrition, severe 12/17/2020  . Neurogenic bowel 11/29/2020  . Neurogenic bladder 11/29/2020  . Cervical myelopathy (HCC) 11/28/2020  . Slow transit constipation   . Normocytic anemia   . Uncontrolled type 2 diabetes mellitus with hyperglycemia (HCC)   . Benign essential HTN   . HTN (hypertension) 11/24/2020  . Staph aureus infection 11/20/2020  . Pressure injury of skin 11/19/2020  . Hypertension associated with diabetes (HCC)   . Abscess 11/18/2020  . 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  Family Med/Psych History: History reviewed. No pertinent family history.   Impression/DX:  HPI: Patient is a 65 year old male with a history of uncontrolled type 2 diabetes (hemoglobin A1c of 13.9), as well as a history of hypertension, who was admitted to the hospital and found to have a cervical spine epidural abscess.  He was taken emergently to the OR and had a laminectomy and abscess evacuation on admission.  He also was found to have a right forearm abscess that was also drained by I&D by orthopedic surgery.  He was seen by infectious disease due to his abscesses growing out MSSA.  He is on IV antibiotics and is on Ancef to complete a 6 weeks course.  Due to patient's epidural abscess he was found to be an incomplete spinal cord  injury patient/an incomplete quadriplegic.  Due to that and neurogenic bowel and bladder, and his impaired level of mobility, as well as his severe uncontrolled pain, he was felt to be appropriate to admit for inpatient rehab at [facility].  The patient was awake and alert but laying back in his bed.  Patient is hard of hearing so all communication was done of the level that he said he was able to hear.  Patient has not been engaged or active is much as we would hope in therapy with descriptions of pain.  He talked about sitting up on the edge of his bed today making him nauseated with some vomit but minimal.  Today most of the focus was on getting him to push himself more and his last days of therapy and explaining the difference between pain and risk of further harm.  Forceful  encouragement was provided regarding his need to be more engaged and active in therapeutic interventions.  Disposition/Plan:  Today worked on coping and adjustment issues with the patient particular in his anxiety and fear pain that is limiting his therapeutic activity.  Patient reports that he does realize he needs to do more in therapies and has been trying to engage more but continues to be limited by his fear of pain.  Diagnosis:    Abdominal pain - Plan: DG Abd 1 View, DG Abd 1 View  Nausea & vomiting - Plan: DG Abd 1 View, DG Abd 1 View, DG Chest 2 View, DG Chest 2 View, DG Abd 1 View, DG Abd 1 View  Pain - Plan: DG Cervical Spine 2 or 3 views, DG Cervical Spine 2 or 3 views, CANCELED: DG Cervical Spine 2-3Vclearing, CANCELED: DG Cervical Spine 2-3Vclearing         Electronically Signed   _______________________ Arley Phenix, Psy.D. Clinical Neuropsychologist

## 2020-12-21 NOTE — Progress Notes (Signed)
PROGRESS NOTE   Subjective/Complaints:  Pt admits "no change" since I left- hasn't really sat up with therapy or completed a transfer per pt.   Still nauseated- didn't eat breakfast- pain 8-9/10- even after duloxetine stopped last week.   Xray shows diffuse/significant ileus as well as severe constipation.  Saw at 7pm today-   ROS:  Pt denies SOB, abd pain, CP, N/V/C/D, and vision changes  Objective:   DG Abd 1 View  Result Date: 12/21/2020 CLINICAL DATA:  Nausea and vomiting for 4 days. EXAM: ABDOMEN - 1 VIEW COMPARISON:  12/12/2020 FINDINGS: Moderate distension of the colon with stool down to the rectosigmoid area. There also diffuse air-filled loops of small bowel without significant distension. Findings most consistent with a diffuse ileus and constipation. Left basilar atelectasis noted.  No pleural effusions. Vascular calcifications. The bony structures are intact. IMPRESSION: Diffuse ileus and constipation. Electronically Signed   By: Marijo Sanes M.D.   On: 12/21/2020 17:00   Recent Labs    12/21/20 0409  WBC 10.8*  HGB 10.9*  HCT 33.9*  PLT 345   Recent Labs    12/21/20 0409  NA 134*  K 3.7  CL 100  CO2 27  GLUCOSE 106*  BUN 15  CREATININE 0.79  CALCIUM 9.1    Intake/Output Summary (Last 24 hours) at 12/21/2020 1854 Last data filed at 12/21/2020 1800 Gross per 24 hour  Intake 236 ml  Output 600 ml  Net -364 ml        Physical Exam: Vital Signs Blood pressure 98/63, pulse 82, temperature 98.3 F (36.8 C), resp. rate 16, height _0  (1.702 m), weight 76.7 kg, SpO2 96 %.    General: awake, alert, appropriate,- laying in bed- refused to let me elevate his head in bed;  NAD HENT: conjugate gaze; oropharynx moist CV: regular rate; no JVD Pulmonary: CTA B/L; no W/R/R- good air movement GI: soft, NT, (+)BS- very hypoactive- no TTP, but appears distended- no tinkling Psychiatric: appropriate, but  admits fearful of pain, so doesn't like to work with therapy- due to that fear- refuses to "do more".  Neurological: alert  Musculoskeletal:     Comments: LUE-strength in his Left arm is 4-/5-in the deltoid, bicep, tricep, wrist extension, grip, and finger abduction RUE-deltoid bicep and tricep are 3/5, wrist extension and grip are 3-/5, LLE-4/5 in hip flexion, knee extension, and dorsiflexion as well as plantarflexion RLE-strength is 4-/5-in the same muscles TTP over L shoulder and L upper trap areas Skin:    General: Skin is warm and dry.     Comments:  PICC line in his left upper extremity looks good  Neurological:  He reports his light touch is intact in all 4 extremities He has no lower extremity clonus bilaterally  Assessment/Plan: 1. Functional deficits which require 3+ hours per day of interdisciplinary therapy in a comprehensive inpatient rehab setting.  Physiatrist is providing close team supervision and 24 hour management of active medical problems listed below.  Physiatrist and rehab team continue to assess barriers to discharge/monitor patient progress toward functional and medical goals  Care Tool:  Bathing  Bathing activity did not occur: Safety/medical concerns (Per OT  eval on medical hold) Body parts bathed by patient: Chest,Abdomen,Front perineal area,Right upper leg,Left upper leg,Face   Body parts bathed by helper: Right arm,Left arm,Buttocks,Right lower leg,Left lower leg     Bathing assist Assist Level: Moderate Assistance - Patient 50 - 74%     Upper Body Dressing/Undressing Upper body dressing   What is the patient wearing?: Pull over shirt    Upper body assist Assist Level: Moderate Assistance - Patient 50 - 74%    Lower Body Dressing/Undressing Lower body dressing      What is the patient wearing?: Pants     Lower body assist Assist for lower body dressing: Moderate Assistance - Patient 50 - 74%     Toileting Toileting    Toileting  assist Assist for toileting: Dependent - Patient 0% (per staff  report, declined at eval. also limited by pain) Assistive Device Comment: foley   Transfers Chair/bed transfer  Transfers assist  Chair/bed transfer activity did not occur: Refused  Chair/bed transfer assist level: Dependent - Patient 0% (lateral slide)     Locomotion Ambulation   Ambulation assist   Ambulation activity did not occur: Safety/medical concerns          Walk 10 feet activity   Assist  Walk 10 feet activity did not occur: Safety/medical concerns        Walk 50 feet activity   Assist Walk 50 feet with 2 turns activity did not occur: Safety/medical concerns         Walk 150 feet activity   Assist Walk 150 feet activity did not occur: Safety/medical concerns         Walk 10 feet on uneven surface  activity   Assist Walk 10 feet on uneven surfaces activity did not occur: Safety/medical concerns         Wheelchair     Assist Will patient use wheelchair at discharge?: Yes Type of Wheelchair: Manual Wheelchair activity did not occur: Refused  Wheelchair assist level: Dependent - Patient 0%      Wheelchair 50 feet with 2 turns activity    Assist    Wheelchair 50 feet with 2 turns activity did not occur: Refused   Assist Level: Dependent - Patient 0%   Wheelchair 150 feet activity     Assist  Wheelchair 150 feet activity did not occur: Refused   Assist Level: Dependent - Patient 0%   Blood pressure 98/63, pulse 82, temperature 98.3 F (36.8 C), resp. rate 16, height _0  (1.702 m), weight 76.7 kg, SpO2 96 %.  Medical Problem List and Plan: 1.  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  Acute DVT in  peroneal/soleus: eliquis restarted 3. Pain Management: Patient is already receiving oxycodone as needed; we started tramadol scheduled but that is not working; will DC tramadol and schedule MS Contin 15 mg twice daily  - on Valium and Firoicet prn per NSU  D/c cymbalta which may be causing emesis.   Continue MS Contin as pain and ability to participate in therapy limited by pain. XR cervical spine stable, soft collar ordered.   4/25- pt refuses  many therapy sessions due to fear of pain- however gets sedated with more pain meds- at an impasse 4. Mood: - Messaged SW regarding adding Mr. Wacha to Neuropsychologyschedule due to uncontrolled pain and new SCI.              -antipsychotic agents: N/A 5. Neuropsych: This patient is capable of making decisions on his own behalf. 6. Skin/Wound Care: We will change his honeycomb dressing on his neck to daily and as needed; will also change the dressing on his right forearm to just a dry dressing and change as needed. Less drainage today, will not require I&D  4/23 minimal drainage, almost healed incision- continue follow/monitor 7. Fluids/Electrolytes/Nutrition: We will check labs on Monday 8. Uncontrolled Diabetes II- A1c of 13.9-continue patient's Levemir 10 units nightly and sliding scale insulin  4/23- CBGs bordereline. increase Levemir to 18 units nightly.    4/25- BGs 96-158- con't regimen 9. Hypertension hx with Orthostatic hypotension-patient is on HCTZ 25 mg daily as well as Norvasc 10 mg daily and lisinopril 40 mg daily and Lopressor 50 mg twice daily-blood pressure is well controlled we will continue these medications and monitor especially with increased activity  4/15: Have reduced Lisinopril, HCTZ, Norvasc and con't Lopressor- still having episode of orthostatic hypotension- might need additional reduction- don't decreased B Blocker due to tachycardia frequently.   4/23 improved control with current regimen Vitals:   12/21/20 0540 12/21/20 1824   BP: 98/63   Pulse: 77 82  Resp: 17 16  Temp: 97.9 F (36.6 C) 98.3 F (36.8 C)  SpO2: 95% 96%    10.  Neurogenic bowel-we will look to start him on a bowel program nightly after dinner with Dulcolax suppository  4/15- KUB showed diffuse constipation- will give Sorbitol 60 ml to clean him out  4/25- full of stool AGAIN- even though on bowel program and on bowel meds- not sure if refusing?- Has diffuse ileus and severe constipation-will give Sorbitol again 60 ml= put on liquid diet- and increase bowel meds 12.  Acute DVT in the soleus and peroneal-we will change his Lovenox to apixaban/Eliquis 10 mg BID x 1 week, then 5 mg BID.  13.  Neurogenic bladder-we will continue his Foley for now and start Flomax tomorrow 0.4 mg q. supper and see if he is able to start to void;  we might need to titrate this as required- will try and remove foley next week.   4/14- still requiring caths- if doesn't have results, soon, will likely need foley to go home with.   4/15- voiding some, but on Max dose of Flomax- might need Foley if going to SNF or family wants it 43. Hypokalemia: supplement 65m BID today and repeat BMP tomorrow  4/8- K+ 3.5- borderline low- will recheck Monday  4/11- K+ 3.3 will replete 40 mEq x2 and recheck Thursday  15. Epidural abscess-  4/5- on  Cefazolin IV for prolonged IV ABX- pt's WBC down to 15.5 (was 14k yesterday, but 20.8 3 days ago)- low grade temps up to 100.2 in last 24 hours-will con't regimen   4/6-White count is down slightly to 14K-we will continue IV antibiotics and check CRP and ESR-weekly  4/7- afebrile- will recheck labs in AM  4/8- WBC stable- con't IV ABX.   4/11- WBC down to 11.3- ESR >140 and CRP 10.9- heading a little better- con't to monitor.   4/15 WBC 9.8, monitor weekly 16. Dysphagia?  D2 diet due to lack of teeth, not due  to dysphagia- doesn't need pills crushed. 17. Vomiting: d/c cymbalta which may be contributing. IV zofran. KUB ordered and shows no  abdominal pathology.   -4/23 improved off iron, reglan dc'ed  4/25- still nauseated- due to ileus  18. Atelectasis: Recommend incentive spirometer to improve breathing strength and ability over time. Provided incentive spirometer education. 19. Lethargy: decreased valium to 20m BID.      LOS: 23 days A FACE TO FACE EVALUATION WAS PERFORMED  Arleen Bar 12/21/2020, 6:54 PM

## 2020-12-21 NOTE — Progress Notes (Signed)
Occupational Therapy Session Note  Patient Details  Name: Daniel Cline MRN: [id number] Date of Birth: October 13, 1955  Today's Date: 12/21/2020 OT Individual Time: 3614-4315 OT Individual Time Calculation (min): 42 min    Short Term Goals: Week 2:  OT Short Term Goal 1 (Week 2): Pt will improve R hand grip strength to 20# to perform with self feeding/grooming with Supervision OT Short Term Goal 1 - Progress (Week 2): Progressing toward goal OT Short Term Goal 2 (Week 2): Pt will transition supine > sit w/ Max of 1 in prep for ADL OT Short Term Goal 2 - Progress (Week 2): Met OT Short Term Goal 3 (Week 2): Pt will roll L/R with Max A of 1 to assist with bed level ADL OT Short Term Goal 3 - Progress (Week 2): Met OT Short Term Goal 4 (Week 2): Pt will tolerate HOB >50* elevation for 5+ minutes during IADL/ADL OT Short Term Goal 4 - Progress (Week 2): Progressing toward goal OT Short Term Goal 5 (Week 2): Pt will thread pants bed level with reacher Mod A OT Short Term Goal 5 - Progress (Week 2): Progressing toward goal Week 3:  OT Short Term Goal 1 (Week 3): STGs = LTGs d/t ELOS  Skilled Therapeutic Interventions/Progress Updates:    Pt greeted at time of session supine in bed resting with DIL Heather present who remained throughout session. Pt HOB elevated to 30+ degrees for oral hygiene, able to complete with Supervision after being handed toothbrush and paste, using B hands for placement of toothpaste on brush. Makeshift collar made with towel and tape in attempt to help with comfort and decrease pain. HOB slowly increased before attempting supine>sit with log rolling to L side, LEs over edge, and brought up to sitting with 2 helpers, leaning back on 2nd helper with mild neck extension, able to tolerate approx 15-20 seconds before laying down. Pt provided rest break before attempting again, supine > sit same manner as above and able to sit EOB approx 1-1:30 minutes for BP to be read, 119/52  with HR of 83 d/t concerns of BP dropping when sitting. Returned to supine and alarm on call bell in reach.   Therapy Documentation Precautions:  Precautions Precautions: Cervical,Fall Required Braces or Orthoses: Cervical Brace Cervical Brace: Soft collar,For comfort Restrictions Weight Bearing Restrictions: No     Therapy/Group: Individual Therapy  Viona Gilmore 12/21/2020, 7:18 AM

## 2020-12-21 NOTE — Progress Notes (Signed)
Physical Therapy Session Note  Patient Details  Name: Daniel Cline MRN: [id number] Date of Birth: 18-Mar-1956  Today's Date: 12/21/2020 PT Individual Time: 1540-0867 and 1430-1500 PT Individual Time Calculation (min): 45 min and 30 mins  Short Term Goals: Week 1:  PT Short Term Goal 1 (Week 1): Pt will tolerate sitting in appropriate wc x 1 hr to allow for participation in therapy session PT Short Term Goal 1 - Progress (Week 1): Met PT Short Term Goal 2 (Week 1): Pt will roll L/R w/mod assist and cues PT Short Term Goal 2 - Progress (Week 1): Met PT Short Term Goal 3 (Week 1): Pt will tolerate sitting on edge of bed/mat x 10 min w/occasional repositioning if needed and mod assist PT Short Term Goal 3 - Progress (Week 1): Not met Week 2:  PT Short Term Goal 1 (Week 2): pt to demonstrate upright sitting position at least ~75 degrees to increase safety with transfers and sitting tolerance PT Short Term Goal 1 - Progress (Week 2): Not met PT Short Term Goal 2 (Week 2): pt to demonstrate supine>sit mod A x1 PT Short Term Goal 2 - Progress (Week 2): Not met PT Short Term Goal 3 (Week 2): Pt will tolerate sitting on edge of bed/mat x 10 min w/occasional repositioning if needed and mod assist PT Short Term Goal 3 - Progress (Week 2): Not met Week 3:  PT Short Term Goal 1 (Week 3): pt to demonstrate upright sitting position at least ~75 degrees to increase safety with transfers and sitting tolerance PT Short Term Goal 2 (Week 3): pt to demonstrate supine>sit mod A x1 PT Short Term Goal 3 (Week 3): Pt will tolerate sitting on edge of bed/mat x 10 min w/occasional repositioning if needed and mod assist Week 4:     Skilled Therapeutic Interventions/Progress Updates:    session 1: pt received in bed and agreeable to therapy. Pt denied wanting to eat breakfast with x2 attempts and encouragement. Pt reported he did not like what they brought, PT explained he can order types of foods he likes but  pt denied to get additional food or tray. Pt directed in rolling x4 total throughout session min A to complete, pt directed in donning shorts mod A to complete for looping BLE and pulling over hips with roll. Pt directed in log roll to pt's L x2 for prep with supine>sit at min A. Transfer to sitting EOB max A x2 with towel roll used as simulated soft collar, pt reports this improved pain, and chux pad used under pt's shoulders and head to assist in trunk support into sitting which also assisted in pain management. Pt tolerated sitting max A 1 min first rep  and 50s second rep at sitting EOB. Pt immediately yelled out and reported "I can't handle this", "please lay be back down". PT encouraged pt to attempt deep breathing and to attempt to relax B shoulders and improved pt's position with posterior support from PT which lightly improved pt's tolerance and decreased pain levels. Max A x2 to return to supine and upright scooting in bed each time. Pt denied to attempt 3rd rep and reported he was nauseous and needed emesis bag, this was given and pt held close but did not vomit. Pt denied further participation in PT. Pt left in bed, All needs in reach and in good condition. Call light in hand.  And alarm set.    Session 2: pt received in bed and agreeable  to therapy. Pt reported, "It doesn't hurt like it usually does" when asked how he felt after attempting to sit EOB multiple times this date. Pt directed in upright scooting for improved positioning in bed, pt asked what the next progression from sitting EOB would be, PT educated pt on attempting to transfer to into sitting. PT demonstrated Stand pivot transfer and Sit to stand transfer and educated on importance of transfer training to promote improved I with return to home as pt DC plan is to currently DC with son and DIL and DIL to assist with needs. Pt verbalized he understood but reported he was unable to attempt today. Pt directed in increased sitting upright  tolerance at bed level with starting at 16 degrees, increased to 30, 35, 40, 45, 50 with 3-4 mins at each increment . Pt directed in BLE mobility with each height of heel slides or hip abduction. Pt denied dizziness throughout. Pt left at 34 degrees at end of session. Pt left in supine, All needs in reach and in good condition. Call light in hand.  And alarm set.    Therapy Documentation Precautions:  Precautions Precautions: Cervical,Fall Required Braces or Orthoses: Cervical Brace Cervical Brace: Soft collar,For comfort Restrictions Weight Bearing Restrictions: No General: PT Amount of Missed Time (min): 15 Minutes PT Missed Treatment Reason: Pain Vital Signs:   Pain:   Mobility:   Locomotion :    Trunk/Postural Assessment :    Balance:   Exercises:   Other Treatments:      Therapy/Group: Individual Therapy  [name] 12/21/2020, 3:36 PM

## 2020-12-22 ENCOUNTER — Inpatient Hospital Stay: Payer: Medicare Other | Admitting: Internal Medicine

## 2020-12-22 ENCOUNTER — Other Ambulatory Visit: Payer: Self-pay

## 2020-12-22 ENCOUNTER — Inpatient Hospital Stay (HOSPITAL_COMMUNITY): Payer: Medicare Other

## 2020-12-22 LAB — GLUCOSE, CAPILLARY
Glucose-Capillary: 116 mg/dL — ABNORMAL HIGH (ref 70–99)
Glucose-Capillary: 124 mg/dL — ABNORMAL HIGH (ref 70–99)
Glucose-Capillary: 104 mg/dL — ABNORMAL HIGH (ref 70–99)
Glucose-Capillary: 116 mg/dL — ABNORMAL HIGH (ref 70–99)

## 2020-12-22 IMAGING — DX DG ABDOMEN 1V
1 series · 1 of 1 positions shown · non-contrast
Comparison: Radiograph yesterday

CLINICAL DATA: Abdominal pain and distension.

EXAM:
ABDOMEN - 1 VIEW

[abdomen kub]
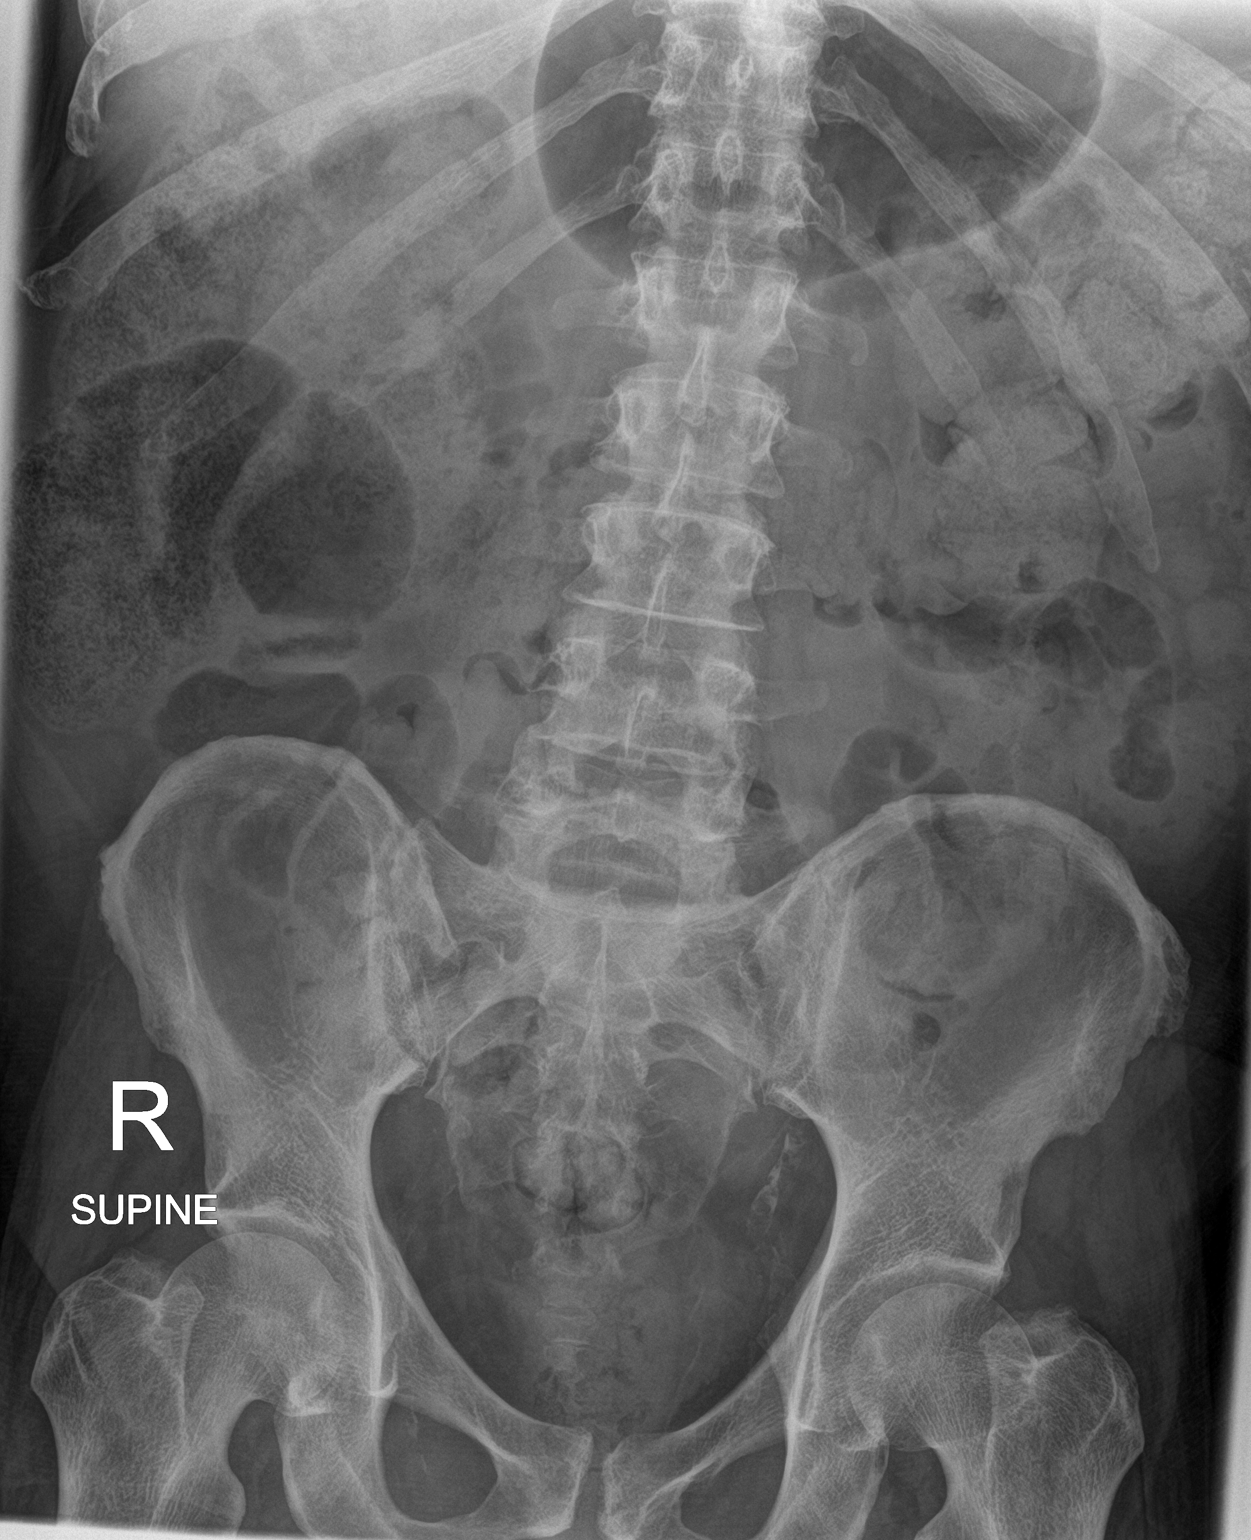

[1 of 1 positions shown; findings below may reference images not displayed]

FINDINGS: Similar or slightly improving gaseous distention of small bowel in
the central abdomen. No progressive bowel dilatation or obstruction.
Large volume of colonic stool. Stool burden is most prominent in the
proximal colon. Mild gaseous gastric distension. No other interval
change.
IMPRESSION: Similar or slightly improving gaseous distention of small bowel in
the central abdomen, suggesting ileus. Large volume of colonic
stool.

## 2020-12-22 MED ORDER — MAGNESIUM CITRATE PO SOLN
1.0000 | Freq: Once | ORAL | Status: AC
  Administered 2020-12-22: 1 via ORAL
  Filled 2020-12-22: qty 296

## 2020-12-22 NOTE — Progress Notes (Signed)
Patient ID: Daniel Cline, male   DOB: [birth year], 65 y.o.   MRN: [id number] Spoke with son-mark via telephone to update regarding team conference progress and still continues to not push himself due to going to have pain. Both he and wife will have their hands full with him at home upon discharge. Asked if have signed up for health insurance he reports still trying to find a plan he can afford. Will not be current once discharges will take 30 days. So equipment will be private pay. Will try to get home health with his medicare part A which he has right now. Reports he was here last two nights with bowel program and left the room for RN to do. Aware MD will be calling to update and discuss foley versus I & O cath. Continue to work on needs for discharge of 5/6

## 2020-12-22 NOTE — Progress Notes (Signed)
PROGRESS NOTE   Subjective/Complaints:  Pt reports still nauseated- did do bowel; program last night- had BM ~ 2am- large.  Explained to pt, found out he had been refusing bowel meds and program- he responded "I'm not supposed to refuse all that?"- I explained he cannot refuse any medicine, but esp not bowel meds or program- will need 5-10 BMs to get cleaned out.  He's tolerating liquid diet.  Giving Mg citrate - f/u KUB ordered.    Family asking about cathing- needs to be cathed vs needs Foley- will need to discuss with family.   ROS:   Pt denies SOB, CP, and vision changes  Objective:   DG Abd 1 View  Result Date: 12/21/2020 CLINICAL DATA:  Nausea and vomiting for 4 days. EXAM: ABDOMEN - 1 VIEW COMPARISON:  12/12/2020 FINDINGS: Moderate distension of the colon with stool down to the rectosigmoid area. There also diffuse air-filled loops of small bowel without significant distension. Findings most consistent with a diffuse ileus and constipation. Left basilar atelectasis noted.  No pleural effusions. Vascular calcifications. The bony structures are intact. IMPRESSION: Diffuse ileus and constipation. Electronically Signed   By: Marijo Sanes M.D.   On: 12/21/2020 17:00   Recent Labs    12/21/20 0409  WBC 10.8*  HGB 10.9*  HCT 33.9*  PLT 345   Recent Labs    12/21/20 0409  NA 134*  K 3.7  CL 100  CO2 27  GLUCOSE 106*  BUN 15  CREATININE 0.79  CALCIUM 9.1    Intake/Output Summary (Last 24 hours) at 12/22/2020 4975 Last data filed at 12/22/2020 0219 Gross per 24 hour  Intake 118 ml  Output 875 ml  Net -757 ml        Physical Exam: Vital Signs Blood pressure 119/74, pulse 84, temperature 97.9 F (36.6 C), resp. rate 18, height '5\' 7"'  (1.702 m), weight 76.7 kg, SpO2 93 %.     General: awake, alert, appropriate, supine in bed- has drank ~ 40% of liquid breakfast;  NAD HENT: conjugate gaze; oropharynx  moist CV: regular rate; no JVD Pulmonary: CTA B/L; no W/R/R- good air movement GI: soft, NT, ND, (+)BS Psychiatric: doesn't appear to understand not supposed to refuse meds/orders? Was confused about this.  Neurological: alert- not sure Ox3  Musculoskeletal:     Comments: LUE-strength in his Left arm is 4-/5-in the deltoid, bicep, tricep, wrist extension, grip, and finger abduction RUE-deltoid bicep and tricep are 3/5, wrist extension and grip are 3-/5, LLE-4/5 in hip flexion, knee extension, and dorsiflexion as well as plantarflexion RLE-strength is 4-/5-in the same muscles TTP over L shoulder and L upper trap areas Skin:    General: Skin is warm and dry.     Comments:  PICC line in his left upper extremity looks good  Neurological:  He reports his light touch is intact in all 4 extremities He has no lower extremity clonus bilaterally  Assessment/Plan: 1. Functional deficits which require 3+ hours per day of interdisciplinary therapy in a comprehensive inpatient rehab setting.  Physiatrist is providing close team supervision and 24 hour management of active medical problems listed below.  Physiatrist and rehab team continue  to assess barriers to discharge/monitor patient progress toward functional and medical goals  Care Tool:  Bathing  Bathing activity did not occur: Safety/medical concerns (Per OT eval on medical hold) Body parts bathed by patient: Chest,Abdomen,Front perineal area,Right upper leg,Left upper leg,Face   Body parts bathed by helper: Right arm,Left arm,Buttocks,Right lower leg,Left lower leg     Bathing assist Assist Level: Moderate Assistance - Patient 50 - 74%     Upper Body Dressing/Undressing Upper body dressing   What is the patient wearing?: Pull over shirt    Upper body assist Assist Level: Moderate Assistance - Patient 50 - 74%    Lower Body Dressing/Undressing Lower body dressing      What is the patient wearing?: Pants     Lower body  assist Assist for lower body dressing: Moderate Assistance - Patient 50 - 74%     Toileting Toileting    Toileting assist Assist for toileting: Dependent - Patient 0% (per staff  report, declined at eval. also limited by pain) Assistive Device Comment: foley   Transfers Chair/bed transfer  Transfers assist  Chair/bed transfer activity did not occur: Refused  Chair/bed transfer assist level: Dependent - Patient 0% (lateral slide)     Locomotion Ambulation   Ambulation assist   Ambulation activity did not occur: Safety/medical concerns          Walk 10 feet activity   Assist  Walk 10 feet activity did not occur: Safety/medical concerns        Walk 50 feet activity   Assist Walk 50 feet with 2 turns activity did not occur: Safety/medical concerns         Walk 150 feet activity   Assist Walk 150 feet activity did not occur: Safety/medical concerns         Walk 10 feet on uneven surface  activity   Assist Walk 10 feet on uneven surfaces activity did not occur: Safety/medical concerns         Wheelchair     Assist Will patient use wheelchair at discharge?: Yes Type of Wheelchair: Manual Wheelchair activity did not occur: Refused  Wheelchair assist level: Dependent - Patient 0%      Wheelchair 50 feet with 2 turns activity    Assist    Wheelchair 50 feet with 2 turns activity did not occur: Refused   Assist Level: Dependent - Patient 0%   Wheelchair 150 feet activity     Assist  Wheelchair 150 feet activity did not occur: Refused   Assist Level: Dependent - Patient 0%   Blood pressure 119/74, pulse 84, temperature 97.9 F (36.6 C), resp. rate 18, height '5\' 7"'  (1.702 m), weight 76.7 kg, SpO2 93 %.  Medical Problem List and Plan: 1.  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  Acute DVT in peroneal/soleus: eliquis restarted 3. Pain Management: Patient is already receiving oxycodone as needed; we started tramadol scheduled but that is not working; will DC tramadol and schedule MS Contin 15 mg twice daily  - on Valium and Firoicet prn per NSU  D/c cymbalta which may be causing emesis.  Continue MS Contin as pain and ability to participate in therapy limited by pain. XR cervical spine stable, soft collar ordered.   4/25- pt refuses many therapy sessions due to fear of pain- however gets sedated with more pain meds- at an impasse  4/26- con't regimen-and max encouragmeent 4. Mood: - Messaged SW regarding adding Mr. Seibert to Neuropsychologyschedule due to uncontrolled pain and new SCI.              -antipsychotic agents: N/A 5. Neuropsych: This patient is capable of making decisions on his own behalf. 6. Skin/Wound Care: We will change his honeycomb dressing on his neck to daily and as needed; will also change the dressing on his right forearm to just a dry dressing and change as needed. Less drainage today, will not require I&D  4/23 minimal drainage, almost healed incision- continue follow/monitor 7. Fluids/Electrolytes/Nutrition: We will check labs on Monday 8. Uncontrolled Diabetes II- A1c of 13.9-continue patient's Levemir 10 units nightly and sliding scale insulin  4/23- CBGs bordereline. increase Levemir to 18 units nightly.    4/26- BGs 106-19- only 1 value over 130- con't regimen 9. Hypertension hx with Orthostatic hypotension-patient is on HCTZ 25 mg daily as well as Norvasc 10 mg daily and lisinopril 40 mg daily and Lopressor 50 mg twice daily-blood pressure is well controlled we will continue these medications and monitor especially with increased activity  4/15: Have reduced Lisinopril, HCTZ, Norvasc and con't Lopressor- still having episode of orthostatic  hypotension- might need additional reduction- don't decreased B Blocker due to tachycardia frequently  4/26- BP well controlled- con't regimen Vitals:   12/21/20 1926 12/22/20 0416  BP: (!) 101/58 119/74  Pulse: 79 84  Resp: 16 18  Temp: 98.3 F (36.8 C) 97.9 F (36.6 C)  SpO2: 95% 93%    10.  Neurogenic bowel-we will look to start him on a bowel program nightly after dinner with Dulcolax suppository  4/15- KUB showed diffuse constipation- will give Sorbitol 60 ml to clean him out  4/25- full of stool AGAIN- even though on bowel program and on bowel meds- not sure if refusing?- Has diffuse ileus and severe constipation-will give Sorbitol again 60 ml= put on liquid diet- and increase bowel meds  4/26- pt thought was "OK" to refuse bowel meds and bowel program- explained is so ful of stool, needs 10+ BMs to clean out- ordered Mg citrate- and f/u KUB 12.  Acute DVT in the soleus and peroneal-we will change his Lovenox to apixaban/Eliquis 10 mg BID x 1 week, then 5 mg BID.  13.  Neurogenic bladder-we will continue his Foley for now and start Flomax tomorrow 0.4 mg q. supper and see if he is able to start to void;  we might need to titrate this as required- will try and remove foley next week.   4/14- still requiring caths- if doesn't have results, soon, will likely need foley to go home with.   4/15- voiding some, but on Max dose of Flomax- might need Foley if going to SNF or family wants it  4/26- still requiring caths- will d/w family about foley.  14. Hypokalemia: supplement 21m BID today and repeat BMP tomorrow  4/8- K+ 3.5- borderline low- will recheck Monday  4/11- K+ 3.3 will replete 40 mEq x2 and recheck Thursday  15. Epidural abscess-  4/5- on  Cefazolin IV for prolonged IV ABX- pt's WBC down to 15.5 (was 14k yesterday, but 20.8 3 days ago)- low grade temps up  to 100.2 in last 24 hours-will con't regimen   4/6-White count is down slightly to 14K-we will continue IV antibiotics and  check CRP and ESR-weekly  4/7- afebrile- will recheck labs in AM  4/8- WBC stable- con't IV ABX.   4/11- WBC down to 11.3- ESR >140 and CRP 10.9- heading a little better- con't to monitor.   4/15 WBC 9.8, monitor weekly 16. Dysphagia?  D2 diet due to lack of teeth, not due to dysphagia- doesn't need pills crushed. 17. Vomiting: d/c cymbalta which may be contributing. IV zofran. KUB ordered and shows no abdominal pathology.   -4/23 improved off iron, reglan dc'ed  4/25- still nauseated- due to ileus  4/26- on liquid diet- con't regimen  18. Atelectasis: Recommend incentive spirometer to improve breathing strength and ability over time. Provided incentive spirometer education. 19. Lethargy: decreased valium to 85m BID.      LOS: 24 days A FACE TO FACE EVALUATION WAS PERFORMED  Edda Orea 12/22/2020, 9:07 AM

## 2020-12-22 NOTE — Patient Care Conference (Signed)
Inpatient RehabilitationTeam Conference and Plan of Care Update Date: 12/22/2020   Time: 11:36 AM    Patient Name: Daniel Cline      Medical Record Number: [id number]  Date of Birth: 07/07/1956 Sex: Male         Room/Bed: 4W03C/4W03C-01 Payor Info: Payor: MEDICARE / Plan: MEDICARE PART A / Product Type: *No Product type* /    Admit Date/Time:  11/28/2020  1:22 PM  Primary Diagnosis:  Cervical myelopathy [facility])  Hospital Problems: Principal Problem:   Cervical myelopathy (HCC) Active Problems:   Epidural intraspinal abscess   Hypertension associated with diabetes (HCC)   Uncontrolled type 2 diabetes mellitus with hyperglycemia (HCC)   Neurogenic bowel   Neurogenic bladder   Protein-calorie malnutrition, severe   Pain    Expected Discharge Date: Expected Discharge Date: 01/01/21  Team Members Present: Physician leading conference: Dr. Genice Rouge Care Coodinator Present: Kennyth Arnold, RN, BSN, CRRN;Becky Dupree, LCSW Nurse Present: Kennyth Arnold, RN PT Present: Otelia Sergeant, PT OT Present: Earleen Newport, OT PPS Coordinator present : Edson Snowball, PT     Current Status/Progress Goal Weekly Team Focus  Bowel/Bladder   Pt is on bowl program; I/O cath d/t no voiding.  Pt will continue bowel program and I/O cath as needed.  Assess q shift and prn.   Swallow/Nutrition/ Hydration             ADL's   Mod-Max bed level bathing/dressing, improved ROM in BUEs (still limited L>R), supine <> sits 2 helpers, tolerate up to 1 minute  Min overall for LB ADLs and transfers - will downgrade  participation, sitting balance/tolerance, UE ROM, bed level ADL progression, family ed, sitting balance   Mobility   Max A 1-2 for supine>sit transfer ; dependent x3 supine lateral transfers with sheet; refused to attempt STS, min A x1 rolling, tolerated sitting EOB 1.5 min mod-max  min A transfers; supervision bed mob; min A gait 50-25'; supervision 150' WC  pain control; bed mob;  transfers; gait; WC   Communication             Safety/Cognition/ Behavioral Observations            Pain   Pt reports pain is controlled with regimen.  Pain will remain <3.  Assess q shift and prn.   Skin   Surgical incision posterior neck; MASD to groin/gluteal folds.  Skin will continue to heal and remain infection free.  Assess q shift and prn.     Discharge Planning:  Family Conference held and family committed to taking him home, have been here for multiple sessions to encourage pt. Signing up for a managed Medicare policy for better coverage. May not take affect until May 1. DC date changed to 5/6   Team Discussion: Massive constipation, ordered KUB, has ilieus, needs foley but want to discuss with family first. He is an ASIA D, so he has the ability. Now on liquid diet. Has been refusing bowel medications. I&O cathing patient, incontinent of bowel. Pain is always 8/10. Incision requiring dry dressing. Plan is to take him home.  Patient on target to meet rehab goals: Is participating a little more. Sat up for a minute for BP check with mod/max assist. He can bring his legs to his chest. Daughter-n-law has been here. He can sit EOB consistently but he is terrified of the pain. Sat up for 1 minute and 30 seconds. Works against everything. He will stiffen his body in anticipation of the  pain.  *See Care Plan and progress notes for long and short-term goals.   Revisions to Treatment Plan:  Liquid diet to due ilieus.  Teaching Needs: Family education, medication management, pain management, anxiety management, constipation management, skin/wound care, transfer training, balance training, endurance training, bowel/bladder education, diabetes education.  Current Barriers to Discharge: Decreased caregiver support, Medical stability, Home enviroment access/layout, IV antibiotics, Incontinence, Neurogenic bowel and bladder, Wound care, Lack of/limited family support, Insurance for SNF  coverage, Weight, Weight bearing restrictions, Medication compliance, Behavior and Nutritional means  Possible Resolutions to Barriers: Continue current medications, offer nutritional support, provide emotional support.     Medical Summary Current Status: diffuse ileus and severe constipation per KUB-  - pain always 8-9/10-at rest/supine- worse with sitting up; no sleep issues  Barriers to Discharge: Behavior;Decreased family/caregiver support;Home enviroment access/layout;Incontinence;Neurogenic Bowel & Bladder;Insurance for SNF coverage;Medication compliance;Medical stability;Wound care;Weight bearing restrictions;Other (comments)  Barriers to Discharge Comments: Quadriplegia due to epidural abscess- fear/terrified of pain biggest limiter Possible Resolutions to [employer] Focus: on liquid diet- KUB f/u- mg citrate/ and bowel program- had to tell pt NOT to refuse meds/bowel program; need to d/w family about need for foley so don't have to cath. max A/Dep to sit up EOB.  extended to 01/01/21- no more extensions- unless will participate more.   Continued Need for Acute Rehabilitation Level of Care: The patient requires daily medical management by a physician with specialized training in physical medicine and rehabilitation for the following reasons: Direction of a multidisciplinary physical rehabilitation program to maximize functional independence : Yes Medical management of patient stability for increased activity during participation in an intensive rehabilitation regime.: Yes Analysis of laboratory values and/or radiology reports with any subsequent need for medication adjustment and/or medical intervention. : Yes   I attest that I was present, lead the team conference, and concur with the assessment and plan of the team.   Tennis Must 12/22/2020, 6:27 PM

## 2020-12-22 NOTE — Progress Notes (Signed)
Physical Therapy Weekly Progress Note  Patient Details  Name: Daniel Cline MRN: [id number] Date of Birth: 29-Sep-1955  Beginning of progress report period: December 15, 2020 End of progress report period: December 22, 2020  Today's Date: 12/22/2020 PT Individual Time: 4010-2725 PT Individual Time Calculation (min): 45 min   Patient has met 1 of 3 short term goals.  Pt currently requires max A of one for supine>sit and max A x2 for sit>supine and upright scooting in bed; pt has not been able to tolerate sitting EOB long enough to safely attempt a standing transfer or Stand pivot transfer to [facility] 2/2 intense pain at posterior cervical spine and B shoulders. Pt has improved with rolling, supine>sit, increased frequency and duration of sitting EOB with mod-max A however continues to be limited profoundly by pain and anxiety about mobility and it causing increased pain. Pt's family have been present intermittently for sessions and updated as appropriate and available on pt's status.   Patient continues to demonstrate the following deficits muscle weakness, decreased cardiorespiratoy endurance, impaired timing and sequencing, unbalanced muscle activation and decreased coordination and decreased sitting balance, decreased standing balance, decreased postural control, decreased balance strategies and difficulty maintaining precautions and therefore will continue to benefit from skilled PT intervention to increase functional independence with mobility.  Patient progressing toward long term goals..  Continue plan of care.  PT Short Term Goals Week 1:  PT Short Term Goal 1 (Week 1): Pt will tolerate sitting in appropriate wc x 1 hr to allow for participation in therapy session PT Short Term Goal 1 - Progress (Week 1): Met PT Short Term Goal 2 (Week 1): Pt will roll L/R w/mod assist and cues PT Short Term Goal 2 - Progress (Week 1): Met PT Short Term Goal 3 (Week 1): Pt will tolerate sitting on edge of  bed/mat x 10 min w/occasional repositioning if needed and mod assist PT Short Term Goal 3 - Progress (Week 1): Not met Week 2:  PT Short Term Goal 1 (Week 2): pt to demonstrate upright sitting position at least ~75 degrees to increase safety with transfers and sitting tolerance PT Short Term Goal 1 - Progress (Week 2): Not met PT Short Term Goal 2 (Week 2): pt to demonstrate supine>sit mod A x1 PT Short Term Goal 2 - Progress (Week 2): Not met PT Short Term Goal 3 (Week 2): Pt will tolerate sitting on edge of bed/mat x 10 min w/occasional repositioning if needed and mod assist PT Short Term Goal 3 - Progress (Week 2): Not met Week 3:  PT Short Term Goal 1 (Week 3): pt to demonstrate upright sitting position at least ~75 degrees to increase safety with transfers and sitting tolerance PT Short Term Goal 1 - Progress (Week 3): Met PT Short Term Goal 2 (Week 3): pt to demonstrate supine>sit mod A x1 PT Short Term Goal 2 - Progress (Week 3): Not met PT Short Term Goal 3 (Week 3): Pt will tolerate sitting on edge of bed/mat x 10 min w/occasional repositioning if needed and mod assist PT Short Term Goal 3 - Progress (Week 3): Revised due to lack of progress Week 4:  PT Short Term Goal 1 (Week 4): pt to demonstrate supine>sit mod A x1 PT Short Term Goal 2 (Week 4): pt to demonstrate functional transfer at max A x1 for safety with DC home PT Short Term Goal 3 (Week 4): Pt will tolerate sitting on edge of bed/mat x 5 min w/occasional  repositioning if needed and mod assist  Skilled Therapeutic Interventions/Progress Updates:    pt received in bed and agreeable to therapy. PT demonstrated squat pivot transfer to pt as he asked about transfer options for home. Pt reported he would attempt, "but if it hurts you'll have to lay me down." PT and rehab tech present to assist pt in trasnfer to sitting EOB. Pt directed in donning B TED hose and socks total A at bed level, pt directed in donning shorts at bed level  max A, min A for rolling to pull shorts over hips. Pt then directed in supine>sit with log roll technique with use of pad to assist pt with trunk support without contact at shoulders which seemed to improve pain levels. Pt tolerated sitting EOB for 1 min initially max A at at least 75 degrees upright, lateral assist at R and L trunk for support, pt initially tolerated well without verbalizing any pain and benefited from [facility] for deep breathing, however at one min pt demonstrated heavy posterior push yelling I need to lay down, PT attempted to give simple VC for lateral lean to to return to supine however pt continued to push posteriorly and for safety pt required max A x2 to return to supine. Pt educated on importance of safety awareness with transfers back to bed and alignment to return to proper position in bed to prevent falls or injury and pt reported he understood, "But when it hurts I can't take it.". pt directed in midline alignment in bed with VC and then max A x2 for upright position in bed. Pt required greatly extra time to decreased tension in B shoulders, relax neck against bed, and promote improved breathing pattern with cues. Pt then relaxed and reported he felt better. Pt left in supine, All needs in reach and in good condition. Call light in hand.  And alarm set. Pt reported pain at 8-9/10 at all times, pt reported he doesn't think it's ever better than this.     Therapy Documentation Precautions:  Precautions Precautions: Cervical,Fall Required Braces or Orthoses: Cervical Brace Cervical Brace: Soft collar,For comfort Restrictions Weight Bearing Restrictions: No General:   Vital Signs:   Pain: Pain Assessment Pain Scale: 0-10 Pain Score: 0-No pain Vision/Perception     Mobility:   Locomotion :    Trunk/Postural Assessment :    Balance:   Exercises:   Other Treatments:     Therapy/Group: Individual Therapy  [name] 12/22/2020, 11:40 AM

## 2020-12-22 NOTE — Progress Notes (Signed)
Occupational Therapy Session Note  Patient Details  Name: Daniel Cline MRN: [id number] Date of Birth: 1956/08/19  Today's Date: 12/22/2020 OT Individual Time: 0626-9485 and 4627-0350 OT Individual Time Calculation (min): 42 min and 39 min   Short Term Goals: Week 2:  OT Short Term Goal 1 (Week 2): Pt will improve R hand grip strength to 20# to perform with self feeding/grooming with Supervision OT Short Term Goal 1 - Progress (Week 2): Progressing toward goal OT Short Term Goal 2 (Week 2): Pt will transition supine > sit w/ Max of 1 in prep for ADL OT Short Term Goal 2 - Progress (Week 2): Met OT Short Term Goal 3 (Week 2): Pt will roll L/R with Max A of 1 to assist with bed level ADL OT Short Term Goal 3 - Progress (Week 2): Met OT Short Term Goal 4 (Week 2): Pt will tolerate HOB >50* elevation for 5+ minutes during IADL/ADL OT Short Term Goal 4 - Progress (Week 2): Progressing toward goal OT Short Term Goal 5 (Week 2): Pt will thread pants bed level with reacher Mod A OT Short Term Goal 5 - Progress (Week 2): Progressing toward goal Week 3:  OT Short Term Goal 1 (Week 3): STGs = LTGs d/t ELOS   Skilled Therapeutic Interventions/Progress Updates:    Session 1: Pt greeted at time of session semireclined in bed with daughter in law [name] present who remained throughout session. Pt agreeable to bathing since he had haircut from DIL yesterday, doffed shirt bed level Max A with rolling L/R with doffing R side first progressing to L arm as it is limited in ROM. UB bathing bed level with Mod A able to use RUE to wash under LUE for first time and rolling R to allow therapist to get back. Donned new shirt with Max A threading LUE first d/t pain and rolling bed level to pull down in the back. Declined LB bathe/dress today. Supine > sit 2 helpers toward L side, able to sit EOB approx 30-45 seconds before +2 assist to return to sidelying > supine. Oral hygiene with Supervision today. Scooted up  in bed +2 assist with DIL providing assist as well for training. Alarm on call bell in reach.    Session 2: Pt greeted at time of session semireclined in bed resting agreeable to OT session. Focus of session on UE ROM using lightweight green dowel and 1# dowel for 2x10 for chest press, FWD circle, ER/IR with bar, and overhead press with Min-Mod distal faciltiation at L wrist and elbow. Supine <> sit 2 helpers able to sit EOB approx 45 seconds prior to needing to sit down but pt able to hold conversation for first time sitting and did not yell in pain. Once back in supine, alarm on call bell in reach.   Therapy Documentation Precautions:  Precautions Precautions: Cervical,Fall Required Braces or Orthoses: Cervical Brace Cervical Brace: Soft collar,For comfort Restrictions Weight Bearing Restrictions: No     Therapy/Group: Individual Therapy  Viona Gilmore 12/22/2020, 7:17 AM

## 2020-12-23 ENCOUNTER — Other Ambulatory Visit: Payer: Self-pay

## 2020-12-23 LAB — GLUCOSE, CAPILLARY
Glucose-Capillary: 110 mg/dL — ABNORMAL HIGH (ref 70–99)
Glucose-Capillary: 111 mg/dL — ABNORMAL HIGH (ref 70–99)
Glucose-Capillary: 102 mg/dL — ABNORMAL HIGH (ref 70–99)
Glucose-Capillary: 113 mg/dL — ABNORMAL HIGH (ref 70–99)

## 2020-12-23 MED ORDER — BUSPIRONE HCL 5 MG PO TABS
5.0000 mg | ORAL_TABLET | Freq: Three times a day (TID) | ORAL | Status: DC
  Administered 2020-12-23 – 2021-01-01 (×27): 5 mg via ORAL
  Filled 2020-12-23 (×27): qty 1

## 2020-12-23 NOTE — Progress Notes (Addendum)
PROGRESS NOTE   Subjective/Complaints:  Spoke to daughter in law at length- will try to stop pt from refusing bowel meds and bowel program-  Had 1 BM last night- didn't finish Mg citrate= sipping it, not chugging- suggested putting in grape juice to help.   Discussed Foley- he decided he wants it placed- doesn't want family cathing him.   Also discussed how his anxiety is making his participation really hard- will try Buspar 5 mg TID for anxiety- can titrate up as required.   ROS:  Pt denies CP, abd pain, still nauseated/constipated Objective:   DG Abd 1 View  Result Date: 12/22/2020 CLINICAL DATA:  Abdominal pain and distension. EXAM: ABDOMEN - 1 VIEW COMPARISON:  Radiograph yesterday FINDINGS: Similar or slightly improving gaseous distention of small bowel in the central abdomen. No progressive bowel dilatation or obstruction. Large volume of colonic stool. Stool burden is most prominent in the proximal colon. Mild gaseous gastric distension. No other interval change. IMPRESSION: Similar or slightly improving gaseous distention of small bowel in the central abdomen, suggesting ileus. Large volume of colonic stool. Electronically Signed   By: Keith Rake M.D.   On: 12/22/2020 15:00   Recent Labs    12/21/20 0409  WBC 10.8*  HGB 10.9*  HCT 33.9*  PLT 345   Recent Labs    12/21/20 0409  NA 134*  K 3.7  CL 100  CO2 27  GLUCOSE 106*  BUN 15  CREATININE 0.79  CALCIUM 9.1    Intake/Output Summary (Last 24 hours) at 12/23/2020 1949 Last data filed at 12/23/2020 1819 Gross per 24 hour  Intake 360 ml  Output 575 ml  Net -215 ml        Physical Exam: Vital Signs Blood pressure 118/69, pulse 74, temperature 98.2 F (36.8 C), temperature source Oral, resp. rate 14, height '5\' 7"'  (1.702 m), weight 75.7 kg, SpO2 95 %.      General: awake, alert, appropriate, brighter affect; sitting up slightly in bed- daughter  in law in room, NAD HENT: conjugate gaze; oropharynx moist CV: regular rate; no JVD Pulmonary: CTA B/L; no W/R/R- good air movement GI: soft, but distended- NT; hypoactive Psychiatric: flat, but brighter than normal; more interactive Neurological: more alert  Musculoskeletal:     Comments: LUE-strength in his Left arm is 4-/5-in the deltoid, bicep, tricep, wrist extension, grip, and finger abduction RUE-deltoid bicep and tricep are 3/5, wrist extension and grip are 3-/5, LLE-4/5 in hip flexion, knee extension, and dorsiflexion as well as plantarflexion RLE-strength is 4-/5-in the same muscles TTP over L shoulder and L upper trap areas Skin:    General: Skin is warm and dry.     Comments:  PICC line in his left upper extremity looks good  Neurological:  He reports his light touch is intact in all 4 extremities He has no lower extremity clonus bilaterally  Assessment/Plan: 1. Functional deficits which require 3+ hours per day of interdisciplinary therapy in a comprehensive inpatient rehab setting.  Physiatrist is providing close team supervision and 24 hour management of active medical problems listed below.  Physiatrist and rehab team continue to assess barriers to discharge/monitor patient progress toward  functional and medical goals  Care Tool:  Bathing  Bathing activity did not occur: Safety/medical concerns (Per OT eval on medical hold) Body parts bathed by patient: Chest,Abdomen,Front perineal area,Right upper leg,Left upper leg,Face,Left arm   Body parts bathed by helper: Right arm,Buttocks,Right lower leg,Left lower leg     Bathing assist Assist Level: Maximal Assistance - Patient 24 - 49%     Upper Body Dressing/Undressing Upper body dressing   What is the patient wearing?: Pull over shirt    Upper body assist Assist Level: Maximal Assistance - Patient 25 - 49%    Lower Body Dressing/Undressing Lower body dressing      What is the patient wearing?: Pants      Lower body assist Assist for lower body dressing: Moderate Assistance - Patient 50 - 74%     Toileting Toileting    Toileting assist Assist for toileting: Dependent - Patient 0% (per staff  report, declined at eval. also limited by pain) Assistive Device Comment: foley   Transfers Chair/bed transfer  Transfers assist  Chair/bed transfer activity did not occur: Refused  Chair/bed transfer assist level: Dependent - Patient 0% (lateral slide)     Locomotion Ambulation   Ambulation assist   Ambulation activity did not occur: Safety/medical concerns          Walk 10 feet activity   Assist  Walk 10 feet activity did not occur: Safety/medical concerns        Walk 50 feet activity   Assist Walk 50 feet with 2 turns activity did not occur: Safety/medical concerns         Walk 150 feet activity   Assist Walk 150 feet activity did not occur: Safety/medical concerns         Walk 10 feet on uneven surface  activity   Assist Walk 10 feet on uneven surfaces activity did not occur: Safety/medical concerns         Wheelchair     Assist Will patient use wheelchair at discharge?: Yes Type of Wheelchair: Manual Wheelchair activity did not occur: Refused  Wheelchair assist level: Dependent - Patient 0%      Wheelchair 50 feet with 2 turns activity    Assist    Wheelchair 50 feet with 2 turns activity did not occur: Refused   Assist Level: Dependent - Patient 0%   Wheelchair 150 feet activity     Assist  Wheelchair 150 feet activity did not occur: Refused   Assist Level: Dependent - Patient 0%   Blood pressure 118/69, pulse 74, temperature 98.2 F (36.8 C), temperature source Oral, resp. rate 14, height '5\' 7"'  (1.702 m), weight 75.7 kg, SpO2 95 %.  Medical Problem List and Plan: 1.  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  2.  Acute DVT in peroneal/soleus: eliquis restarted 3. Pain Management: Patient is already receiving oxycodone as needed; we started tramadol scheduled but that is not working; will DC tramadol and schedule MS Contin 15 mg twice daily  - on Valium and Firoicet prn per NSU  D/c cymbalta which  may be causing emesis.   Continue MS Contin as pain and ability to participate in therapy limited by pain. XR cervical spine stable, soft collar ordered.   4/25- pt refuses many therapy sessions due to fear of pain- however gets sedated with more pain meds- at an impasse  4/26- con't regimen-and max encouragmeent  4/27- pain sounds like down to 5/10 after d/w pt what he means- it's not 8/10 based on description, con't regimen 4. Mood: - Messaged SW regarding adding Mr. Spradley to Neuropsychologyschedule due to uncontrolled pain and new SCI.   4/27- will add Buspar 5 mg TID and change as required             -antipsychotic agents: N/A 5. Neuropsych: This patient is capable of making decisions on his own behalf. 6. Skin/Wound Care: We will change his honeycomb dressing on his neck to daily and as needed; will also change the dressing on his right forearm to just a dry dressing and change as needed. Less drainage today, will not require I&D  4/23 minimal drainage, almost healed incision- continue follow/monitor 7. Fluids/Electrolytes/Nutrition: We will check labs on Monday 8. Uncontrolled Diabetes II- A1c of 13.9-continue patient's Levemir 10 units nightly and sliding scale insulin  4/23- CBGs bordereline. increase Levemir to 18 units nightly.    4/26- BGs 106-19- only 1 value over 130- con't regimen 9. Hypertension hx with Orthostatic hypotension-patient is on HCTZ 25 mg daily as well as Norvasc 10 mg daily and lisinopril 40 mg  daily and Lopressor 50 mg twice daily-blood pressure is well controlled we will continue these medications and monitor especially with increased activity  4/15: Have reduced Lisinopril, HCTZ, Norvasc and con't Lopressor- still having episode of orthostatic hypotension- might need additional reduction- don't decreased B Blocker due to tachycardia frequently  4/27- BP controlled- con't regimen Vitals:   12/23/20 0501 12/23/20 1323  BP: 109/65 118/69  Pulse: 75 74  Resp: 17 14  Temp: 98 F (36.7 C) 98.2 F (36.8 C)  SpO2: 96% 95%    10.  Neurogenic bowel-we will look to start him on a bowel program nightly after dinner with Dulcolax suppository  4/15- KUB showed diffuse constipation- will give Sorbitol 60 ml to clean him out  4/25- full of stool AGAIN- even though on bowel program and on bowel meds- not sure if refusing?- Has diffuse ileus and severe constipation-will give Sorbitol again 60 ml= put on liquid diet- and increase bowel meds  4/26- pt thought was "OK" to refuse bowel meds and bowel program- explained is so ful of stool, needs 10+ BMs to clean out- ordered Mg citrate- and f/u KUB  4/27- KUB not much better- but will con't liquid diet- doesn't want NG tube- to finish Mg citrate today- d/w pt and family.  12.  Acute DVT in the soleus and peroneal-we will change his Lovenox to apixaban/Eliquis 10 mg BID x 1 week, then 5 mg BID.  13.  Neurogenic bladder-we will continue his Foley for now and start Flomax tomorrow 0.4 mg q. supper and see if he is able to start to void;  we might need to titrate this as required- will try and remove foley next week.   4/14- still requiring caths- if doesn't have results, soon, will likely need foley to go home with.   4/15- voiding some, but on Max dose of Flomax- might need Foley if going to SNF or family wants it  4/26- still requiring caths- will d/w family about foley.  4/27- will start Chronic foley- since pt doesn't want family cathing him.   14.  Hypokalemia: supplement 6m BID today and repeat BMP tomorrow  4/8- K+ 3.5- borderline low- will recheck Monday  4/11- K+ 3.3 will replete 40 mEq x2 and recheck Thursday  15. Epidural abscess-  4/5- on  Cefazolin IV for prolonged IV ABX- pt's WBC down to 15.5 (was 14k yesterday, but 20.8 3 days ago)- low grade temps up to 100.2 in last 24 hours-will con't regimen   4/6-White count is down slightly to 14K-we will continue IV antibiotics and check CRP and ESR-weekly  4/7- afebrile- will recheck labs in AM  4/8- WBC stable- con't IV ABX.   4/11- WBC down to 11.3- ESR >140 and CRP 10.9- heading a little better- con't to monitor.   4/15 WBC 9.8, monitor weekly 16. Dysphagia?  D2 diet due to lack of teeth, not due to dysphagia- doesn't need pills crushed. 17. Vomiting: d/c cymbalta which may be contributing. IV zofran. KUB ordered and shows no abdominal pathology.   -4/23 improved off iron, reglan dc'ed  4/25- still nauseated- due to ileus  4/26- on liquid diet- con't regimen  4/27- ileus not much better- conn't liquid diet/Mg citrate-   18. Atelectasis: Recommend incentive spirometer to improve breathing strength and ability over time. Provided incentive spirometer education. 19. Lethargy: decreased valium to 190mBID.    Spent 35 minutes on total care today- >50% on coordination of care- d/w pt and family about Anxiety, pain, cathing/vs foley and bowel program  LOS: 25 days A FACE TO FACE EVALUATION WAS PERFORMED  [place] Sigmund 12/23/2020, 7:49 PM

## 2020-12-23 NOTE — Progress Notes (Signed)
Occupational Therapy Session Note  Patient Details  Name: Daniel Cline MRN: [id number] Date of Birth: July 19, 1956  Today's Date: 12/23/2020 OT Individual Time: 1015-1058 OT Individual Time Calculation (min): 43 min    Short Term Goals: Week 2:  OT Short Term Goal 1 (Week 2): Pt will improve R hand grip strength to 20# to perform with self feeding/grooming with Supervision OT Short Term Goal 1 - Progress (Week 2): Progressing toward goal OT Short Term Goal 2 (Week 2): Pt will transition supine > sit w/ Max of 1 in prep for ADL OT Short Term Goal 2 - Progress (Week 2): Met OT Short Term Goal 3 (Week 2): Pt will roll L/R with Max A of 1 to assist with bed level ADL OT Short Term Goal 3 - Progress (Week 2): Met OT Short Term Goal 4 (Week 2): Pt will tolerate HOB >50* elevation for 5+ minutes during IADL/ADL OT Short Term Goal 4 - Progress (Week 2): Progressing toward goal OT Short Term Goal 5 (Week 2): Pt will thread pants bed level with reacher Mod A OT Short Term Goal 5 - Progress (Week 2): Progressing toward goal Week 3:  OT Short Term Goal 1 (Week 3): STGs = LTGs d/t ELOS  Skilled Therapeutic Interventions/Progress Updates:    Pt greeted at time of session reclined in bed resting agreeable to OT session, DIL Heather present who remained throughout session. Initially wanting to brush teeth, performed with Supervision bed level after set up with materials and HOB elevated. Pt and family had questions regarding DC planning, bowel program, liquid diet, etc which were answered and recommended further questions to nursing. Supine <> sit x2 trials with 2 helpers using chuck pad to elevate and towel roll as a "collar." able to tolerate sitting EOB approx 45 seconds per trial, agreeable to try to stand today which is an improvement but after reciprocal scooting hips to EOB, pain limiting and returned to supine. Scoot up in bed x2 helpers. Alarm on call bell in reach. Confirmed with nursing  regarding liquid diet if DIL could bring a soup, confirmed OK and communicated with family.   Therapy Documentation Precautions:  Precautions Precautions: Cervical,Fall Required Braces or Orthoses: Cervical Brace Cervical Brace: Soft collar,For comfort Restrictions Weight Bearing Restrictions: No    Therapy/Group: Individual Therapy  Viona Gilmore 12/23/2020, 7:21 AM

## 2020-12-23 NOTE — Progress Notes (Signed)
Physical Therapy Session Note  Patient Details  Name: Daniel Cline MRN: [id number] Date of Birth: August 28, 1956  Today's Date: 12/23/2020 PT Individual Time: 0902-1000 and 1350-1320 PT Individual Time Calculation (min): 58 min and 30 min.  Short Term Goals: Week 3:  PT Short Term Goal 1 (Week 3): pt to demonstrate upright sitting position at least ~75 degrees to increase safety with transfers and sitting tolerance PT Short Term Goal 1 - Progress (Week 3): Met PT Short Term Goal 2 (Week 3): pt to demonstrate supine>sit mod A x1 PT Short Term Goal 2 - Progress (Week 3): Not met PT Short Term Goal 3 (Week 3): Pt will tolerate sitting on edge of bed/mat x 10 min w/occasional repositioning if needed and mod assist PT Short Term Goal 3 - Progress (Week 3): Revised due to lack of progress  Skilled Therapeutic Interventions/Progress Updates:   First session:  Pt presents supine in bed and agreeable to therapy.  Pt able to lift LEs to thread shorts over and then pt reaches to pull pants up to hips.  Pt requires mod A for rolling side to side, rolling Left > than right 2/2 L shoulder dysfunction.  Pt able to assist w/ pulling shorts over hips w/ assist w/ sidelying from PT.  Towel roll applied to c/spine as done previous w/ good results.  Pt required A of 2 (DIL in room and assisting) for L sidelying to sit using Chux pad .  Pt sat EOB x 1' B feet supported, but somewhat retropulsed, c/o pain to shoulders and neck. Pt requested to lie back down, increased pain.  Pt required assist of 2 to come down to sidelying position and bring LEs onto bed.  Pt performed there ex to increase strength U and LEs.  Pt performed 3 x 10 HS, abd/add, SAQ, and R UE "punch" and shoulder rolls in supine and then self mobilizing L shoulder w/ RUE into shoulder flexion.  Pt performed 2nd sitting EOB w/ same assist and only 30 seconds, c/o pain.  Pt tends to push self back to supine if too painful.  Pt scooted to [facility] w/ total A,  but pt pushes w/ B LEs if cued.  Bed alarm on and all needs in reach.  DIL present during session.    Second session:  Pt presents supine in bed.  Pt performed LE there ex to increase strength.  Pt performed AP, HS, abd/add and SAQ 3 x 10 w/ 2# AW, able to perform bridges x 10 w/ minimal clearance of hips.  Pt rolled side to side w/ min A and use of side rails to re-position chux.  Pt able to perform w/ cueing and then for final 1/3 of roll.  Pt rolls to Left > right, but is aware of use of hooklying position and swinging of UEs to initiate.  Pt able to assist scoot to [facility].  Bed alarm on and all needs in reach.  DIL present during session.     Therapy Documentation Precautions:  Precautions Precautions: Cervical,Fall Required Braces or Orthoses: Cervical Brace Cervical Brace: Soft collar,For comfort Restrictions Weight Bearing Restrictions: No General:   Vital Signs:   Pain: 8/10 across shoulders and neck. Pain Assessment Pain Scale: 0-10 Pain Score: 5  Faces Pain Scale: Hurts a little bit Pain Type: Surgical pain Pain Location: Neck Pain Descriptors / Indicators: Aching;Heaviness Pain Onset: On-going PAINAD (Pain Assessment in Advanced Dementia) Breathing: normal Negative Vocalization: none Facial Expression: smiling or inexpressive Body  Language: relaxed Consolability: no need to console PAINAD Score: 0 Critical Care Pain Observation Tool (CPOT) Facial Expression: Relaxed, neutral Body Movements: Absence of movements Muscle Tension: Relaxed Compliance with ventilator (intubated pts.): Tolerating ventilator or movement Vocalization (extubated pts.): N/A CPOT Total: 0 Mobility:   Locomotion :    Trunk/Postural Assessment :    Balance:   Exercises:   Other Treatments:      Therapy/Group: Individual Therapy  Daniel Cline 12/23/2020, 10:25 AM

## 2020-12-24 ENCOUNTER — Inpatient Hospital Stay (HOSPITAL_COMMUNITY): Payer: Medicare Other

## 2020-12-24 LAB — FUNGUS CULTURE WITH STAIN

## 2020-12-24 LAB — FUNGUS CULTURE RESULT

## 2020-12-24 LAB — GLUCOSE, CAPILLARY
Glucose-Capillary: 126 mg/dL — ABNORMAL HIGH (ref 70–99)
Glucose-Capillary: 115 mg/dL — ABNORMAL HIGH (ref 70–99)
Glucose-Capillary: 102 mg/dL — ABNORMAL HIGH (ref 70–99)
Glucose-Capillary: 113 mg/dL — ABNORMAL HIGH (ref 70–99)

## 2020-12-24 LAB — FUNGAL ORGANISM REFLEX

## 2020-12-24 IMAGING — DX DG ABDOMEN 1V
2 series · 2 of 2 positions shown · non-contrast
Comparison: [DATE].

CLINICAL DATA: Postoperative ileus.

EXAM:
ABDOMEN - 1 VIEW

[abdomen supine (1 of 2)]
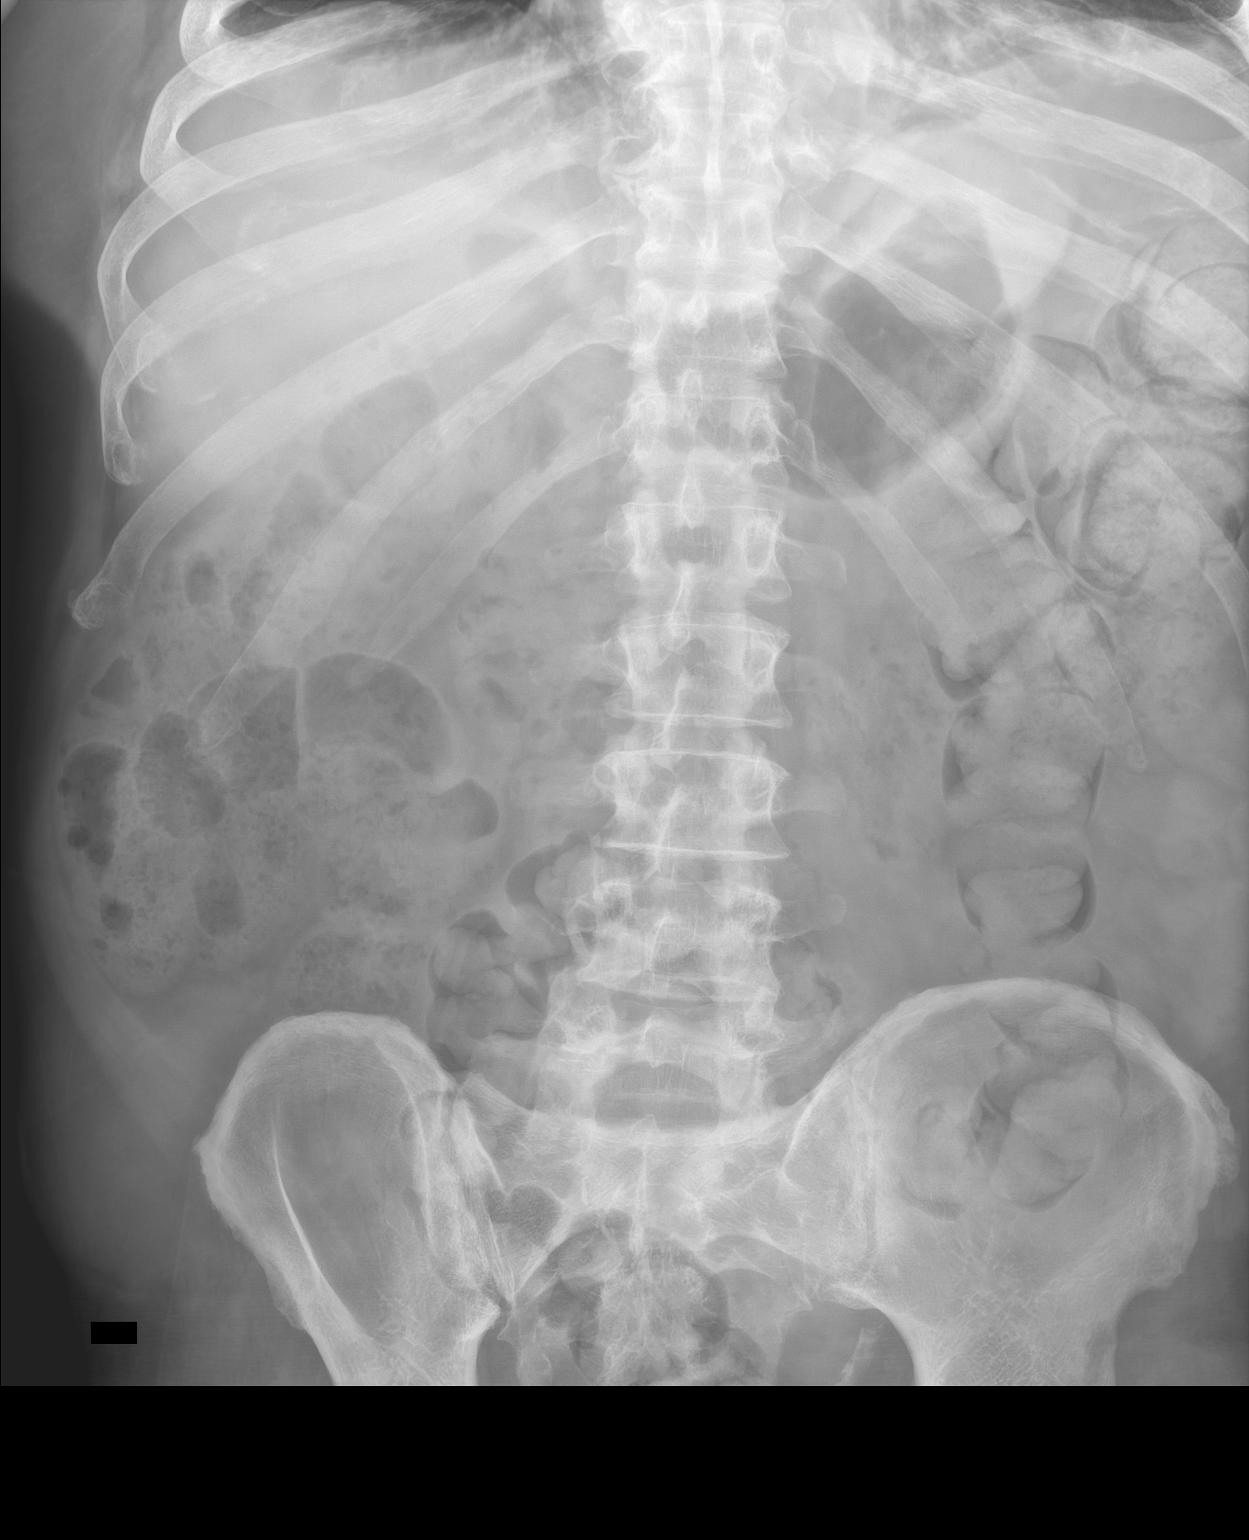

[abdomen supine (2 of 2)]
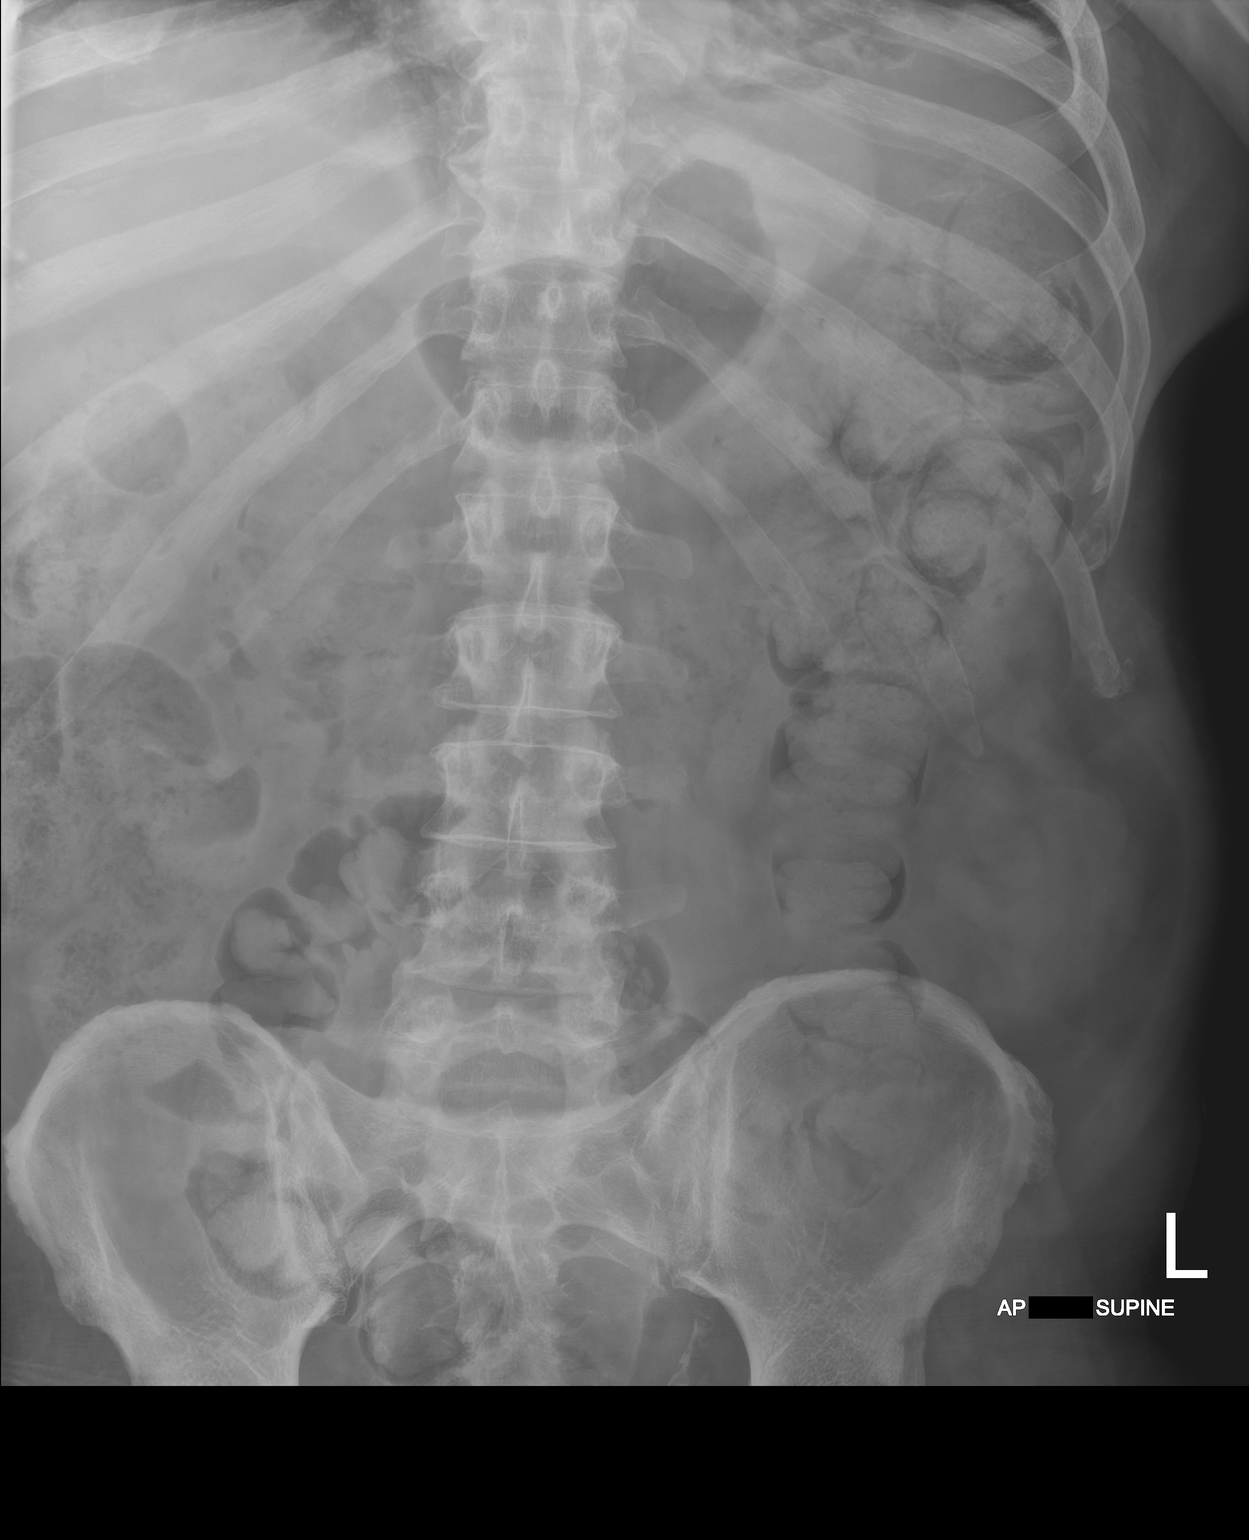

[2 of 2 positions shown; findings below may reference images not displayed]

FINDINGS: Interval improvement in gaseous distension of the small bowel. On
today's study there is a moderate stool burden noted throughout the
colon up to the level of the rectum. Stomach appears nondistended.
IMPRESSION: 1. Interval improvement in gaseous distension of the small bowel.
2. Moderate stool burden throughout the colon compatible with
constipation.

## 2020-12-24 MED ORDER — FLEET ENEMA 7-19 GM/118ML RE ENEM
1.0000 | ENEMA | Freq: Once | RECTAL | Status: AC
  Administered 2020-12-24: 1 via RECTAL
  Filled 2020-12-24: qty 1

## 2020-12-24 MED ORDER — BOOST / RESOURCE BREEZE PO LIQD CUSTOM
1.0000 | Freq: Three times a day (TID) | ORAL | Status: DC
  Administered 2020-12-24 – 2020-12-29 (×17): 1 via ORAL

## 2020-12-24 MED ORDER — MAGNESIUM CITRATE PO SOLN
1.0000 | Freq: Once | ORAL | Status: AC
  Administered 2020-12-24: 1 via ORAL
  Filled 2020-12-24: qty 296

## 2020-12-24 NOTE — Progress Notes (Signed)
PROGRESS NOTE   Subjective/Complaints:  Pt brighter - says "something" not right- still nauseated/sick to stomach.  Foley is going well- "so far so good".  Also c/o bad taste in mouth "tastes sick".   Discussed NGT and how can decompress stomach which would help things, Sx's and nausea faster than what we are doing. Pt says no- but admits once I explained if KUB is worse, will have to do it- he agreed only if KUB worse. Scared of "unknown".    Will reorder Mg citrate to try and get him cleaned out more and order enema.   ROS:  Pt denies SOB, abd pain, CP,and vision changes    Objective:   DG Abd 1 View  Result Date: 12/22/2020 CLINICAL DATA:  Abdominal pain and distension. EXAM: ABDOMEN - 1 VIEW COMPARISON:  Radiograph yesterday FINDINGS: Similar or slightly improving gaseous distention of small bowel in the central abdomen. No progressive bowel dilatation or obstruction. Large volume of colonic stool. Stool burden is most prominent in the proximal colon. Mild gaseous gastric distension. No other interval change. IMPRESSION: Similar or slightly improving gaseous distention of small bowel in the central abdomen, suggesting ileus. Large volume of colonic stool. Electronically Signed   By: Keith Rake M.D.   On: 12/22/2020 15:00   No results for input(s): WBC, HGB, HCT, PLT in the last 72 hours. No results for input(s): NA, K, CL, CO2, GLUCOSE, BUN, CREATININE, CALCIUM in the last 72 hours.  Intake/Output Summary (Last 24 hours) at 12/24/2020 0853 Last data filed at 12/24/2020 0515 Gross per 24 hour  Intake 240 ml  Output 1200 ml  Net -960 ml        Physical Exam: Vital Signs Blood pressure 120/67, pulse 78, temperature 98 F (36.7 C), resp. rate 14, height _0  (1.702 m), weight 75.7 kg, SpO2 97 %.      General: awake, alert, appropriate, sitting up a little more in bed- brighter/less anxious affect; NAD HENT:  conjugate gaze; oropharynx moist CV: regular rate; no JVD Pulmonary: CTA B/L; no W/R/R- good air movement GI: soft, NNT- but still somewhat distended- hyperactive sounding Psychiatric: appropriate- less anxious; brighter Neurological: alert- more with it.   Musculoskeletal:     Comments: LUE-strength in his Left arm is 4-/5-in the deltoid, bicep, tricep, wrist extension, grip, and finger abduction RUE-deltoid bicep and tricep are 3/5, wrist extension and grip are 3-/5, LLE-4/5 in hip flexion, knee extension, and dorsiflexion as well as plantarflexion RLE-strength is 4-/5-in the same muscles TTP over L shoulder and L upper trap areas Skin:    General: Skin is warm and dry.     Comments:  PICC line in his left upper extremity looks good  Neurological:  He reports his light touch is intact in all 4 extremities He has no lower extremity clonus bilaterally  Assessment/Plan: 1. Functional deficits which require 3+ hours per day of interdisciplinary therapy in a comprehensive inpatient rehab setting.  Physiatrist is providing close team supervision and 24 hour management of active medical problems listed below.  Physiatrist and rehab team continue to assess barriers to discharge/monitor patient progress toward functional and medical goals  Care Tool:  Bathing  Bathing activity did not occur: Safety/medical concerns (Per OT eval on medical hold) Body parts bathed by patient: Chest,Abdomen,Front perineal area,Right upper leg,Left upper leg,Face,Left arm   Body parts bathed by helper: Right arm,Buttocks,Right lower leg,Left lower leg     Bathing assist Assist Level: Maximal Assistance - Patient 24 - 49%     Upper Body Dressing/Undressing Upper body dressing   What is the patient wearing?: Pull over shirt    Upper body assist Assist Level: Maximal Assistance - Patient 25 - 49%    Lower Body Dressing/Undressing Lower body dressing      What is the patient wearing?: Pants      Lower body assist Assist for lower body dressing: Moderate Assistance - Patient 50 - 74%     Toileting Toileting    Toileting assist Assist for toileting: Dependent - Patient 0% (per staff  report, declined at eval. also limited by pain) Assistive Device Comment: foley   Transfers Chair/bed transfer  Transfers assist  Chair/bed transfer activity did not occur: Refused  Chair/bed transfer assist level: Dependent - Patient 0% (lateral slide)     Locomotion Ambulation   Ambulation assist   Ambulation activity did not occur: Safety/medical concerns          Walk 10 feet activity   Assist  Walk 10 feet activity did not occur: Safety/medical concerns        Walk 50 feet activity   Assist Walk 50 feet with 2 turns activity did not occur: Safety/medical concerns         Walk 150 feet activity   Assist Walk 150 feet activity did not occur: Safety/medical concerns         Walk 10 feet on uneven surface  activity   Assist Walk 10 feet on uneven surfaces activity did not occur: Safety/medical concerns         Wheelchair     Assist Will patient use wheelchair at discharge?: Yes Type of Wheelchair: Manual Wheelchair activity did not occur: Refused  Wheelchair assist level: Dependent - Patient 0%      Wheelchair 50 feet with 2 turns activity    Assist    Wheelchair 50 feet with 2 turns activity did not occur: Refused   Assist Level: Dependent - Patient 0%   Wheelchair 150 feet activity     Assist  Wheelchair 150 feet activity did not occur: Refused   Assist Level: Dependent - Patient 0%   Blood pressure 120/67, pulse 78, temperature 98 F (36.7 C), resp. rate 14, height _0  (1.702 m), weight 75.7 kg, SpO2 97 %.  Medical Problem List and Plan: 1.  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  Acute DVT in peroneal/soleus: eliquis restarted 3. Pain Management: Patient is already receiving oxycodone as needed; we started tramadol scheduled but that is not working; will DC tramadol and schedule MS Contin 15 mg twice daily  - on Valium and Firoicet prn per NSU  D/c cymbalta which may be causing emesis.   Continue MS Contin as pain and ability to participate in therapy limited by pain.  XR cervical spine stable, soft collar ordered.   4/25- pt refuses many therapy sessions due to fear of pain- however gets sedated with more pain meds- at an impasse  4/26- con't regimen-and max encouragmeent  4/27- pain sounds like down to 5/10 after d/w pt what he means- it's not 8/10 based on description, con't regimen  4/28- pt reports pain as ~ 5/10- but main issue is nausea this AM- con't regimen 4. Mood: - Messaged SW regarding adding Mr. Pieper to Neuropsychologyschedule due to uncontrolled pain and new SCI.   4/27- will add Buspar 5 mg TID and change as required  4/28- looks alittle better today             -antipsychotic agents: N/A 5. Neuropsych: This patient is capable of making decisions on his own behalf. 6. Skin/Wound Care: We will change his honeycomb dressing on his neck to daily and as needed; will also change the dressing on his right forearm to just a dry dressing and change as needed. Less drainage today, will not require I&D  4/23 minimal drainage, almost healed incision- continue follow/monitor 7. Fluids/Electrolytes/Nutrition: We will check labs on Monday 8. Uncontrolled Diabetes II- A1c of 13.9-continue patient's Levemir 10 units nightly and sliding scale insulin  4/23- CBGs bordereline. increase Levemir to 18 units nightly.    4/26- BGs 106-19- only 1 value over 130- con't regimen  4/28- BG 102-126- con't regimen 9. Hypertension hx with Orthostatic  hypotension-patient is on HCTZ 25 mg daily as well as Norvasc 10 mg daily and lisinopril 40 mg daily and Lopressor 50 mg twice daily-blood pressure is well controlled we will continue these medications and monitor especially with increased activity  4/15: Have reduced Lisinopril, HCTZ, Norvasc and con't Lopressor- still having episode of orthostatic hypotension- might need additional reduction- don't decreased B Blocker due to tachycardia frequently  4/27- BP controlled- con't regimen Vitals:   12/23/20 1952 12/24/20 0513  BP: 102/74 120/67  Pulse: 73 78  Resp: 16 14  Temp: 98.5 F (36.9 C) 98 F (36.7 C)  SpO2: 97% 97%    10.  Neurogenic bowel-we will look to start him on a bowel program nightly after dinner with Dulcolax suppository  4/15- KUB showed diffuse constipation- will give Sorbitol 60 ml to clean him out  4/25- full of stool AGAIN- even though on bowel program and on bowel meds- not sure if refusing?- Has diffuse ileus and severe constipation-will give Sorbitol again 60 ml= put on liquid diet- and increase bowel meds  4/26- pt thought was "OK" to refuse bowel meds and bowel program- explained is so ful of stool, needs 10+ BMs to clean out- ordered Mg citrate- and f/u KUB  4/27- KUB not much better- but will con't liquid diet- doesn't want NG tube- to finish Mg citrate today- d/w pt and family.   4/28- will recheck KUB today and also ordered Mg citrate again and Fleets enema at 3pm- pt said IF KUB worse, willing to do NGT, but only if worse 12.  Acute DVT in the soleus and peroneal-we will change his Lovenox to apixaban/Eliquis 10 mg BID x 1 week, then 5 mg BID.  13.  Neurogenic bladder-we will continue his Foley for now and start Flomax tomorrow 0.4 mg q. supper and see if he is able to start to void;  we might need to titrate this as required- will try and remove foley next week.   4/14- still requiring caths- if doesn't have results, soon, will  likely need foley to go home with.    4/15- voiding some, but on Max dose of Flomax- might need Foley if going to SNF or family wants it  4/26- still requiring caths- will d/w family about foley.  4/27- will start Chronic foley- since pt doesn't want family cathing him.   14. Hypokalemia: supplement 77m BID today and repeat BMP tomorrow  4/8- K+ 3.5- borderline low- will recheck Monday  4/11- K+ 3.3 will replete 40 mEq x2 and recheck Thursday  15. Epidural abscess-  4/5- on  Cefazolin IV for prolonged IV ABX- pt's WBC down to 15.5 (was 14k yesterday, but 20.8 3 days ago)- low grade temps up to 100.2 in last 24 hours-will con't regimen   4/6-White count is down slightly to 14K-we will continue IV antibiotics and check CRP and ESR-weekly  4/7- afebrile- will recheck labs in AM  4/8- WBC stable- con't IV ABX.   4/11- WBC down to 11.3- ESR >140 and CRP 10.9- heading a little better- con't to monitor.   4/15 WBC 9.8, monitor weekly 16. Dysphagia?  D2 diet due to lack of teeth, not due to dysphagia- doesn't need pills crushed. 17. Vomiting: d/c cymbalta which may be contributing. IV zofran. KUB ordered and shows no abdominal pathology.   -4/23 improved off iron, reglan dc'ed  4/25- still nauseated- due to ileus  4/26- on liquid diet- con't regimen  4/27- ileus not much better- conn't liquid diet/Mg citrate-   4/28- as above- only NGT if KUB worse- pt agreed to this compromise- added another dose mg citrate and fleets enema.   18. Atelectasis: Recommend incentive spirometer to improve breathing strength and ability over time. Provided incentive spirometer education. 19. Lethargy: decreased valium to 138mBID.    LOS: 26 days A FACE TO FACE EVALUATION WAS PERFORMED  Daniel Cline 12/24/2020, 8:53 AM

## 2020-12-24 NOTE — Progress Notes (Signed)
Nutrition Follow-up  DOCUMENTATION CODES:   Severe malnutrition in context of acute illness/injury  INTERVENTION:   Pt with severe malnutrition and has been on a clear liquid diet since 4/25. Will monitor for ability for diet advancement vs need for nutrition support.  - Boost Breeze po TID, each supplement provides 250 kcal and 9 grams of protein  NUTRITION DIAGNOSIS:   Severe Malnutrition related to acute illness (cervical spine epidural abscess) as evidenced by moderate fat depletion,moderate muscle depletion,percent weight loss (21.4% weight loss in less than 1 month).  Ongoing  GOAL:   Patient will meet greater than or equal to 90% of their needs  Unmet  MONITOR:   PO intake,Supplement acceptance,Labs,Weight trends,Skin  REASON FOR ASSESSMENT:   Malnutrition Screening Tool    ASSESSMENT:   65 year old male PMH of uncontrolled T2DM, HTN. Pt was found to have a cervical spine epidural abscess and was taken emergently to the OR and had a laminectomy and abscess evacuation. Pt also was found to have a right forearm abscess and is s/p I&D. 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 Admitted to CIR on 4/03.  4/25 - clear liquid diet due to ileus  Spoke with pt's daughter-in-law at bedside. Nursing at bedside providing nursing care. Daughter-in-law reports that pt tried drinking the bottle of magnesium citrate quickly then promptly had an episode of emesis. Pt is not feeling well at this time.  Pt's daughter-in-law confirms pt has been on "liquid diet" since 4/25. Since that time, pt has been consuming clear liquids from meal trays (juice, popsicle, jello, broth) and items that family brings in that have been approved by MD/nursing staff like egg drop soup and Premier Protein shakes. Pt's daughter-in-law states that pt has been consuming about 1 Premier Protein shake daily since "liquid diet" was started.  Pt with severe malnutrition and  has been on a clear liquid diet since 4/25. If pt unable to have diet advanced past clear liquids in next few days, would recommend initiation of TPN.  Meal Completion: 10-50% x last 8 meals  Medications reviewed and include: dulcolax, SSI, levemir 18 units daily, magnesium citrate, MVI with minerals, miralax, senna, fleet enema, IV abx  Labs reviewed: sodium 134 CBG's: 102-126 x 24 hours  Diet Order:   Diet Order            Diet clear liquid Room service appropriate? Yes; Fluid consistency: Thin  Diet effective now           Diet - low sodium heart healthy                 EDUCATION NEEDS:   Education needs have been addressed  Skin:  Skin Assessment:  Skin Integrity Issues: Incisions: cervical, R arm Other: MASD to groin  Last BM:  12/22/20 large type 6  Height:   Ht Readings from Last 1 Encounters:  11/28/20 5\' 7"  (1.702 m)    Weight:   Wt Readings from Last 1 Encounters:  12/23/20 75.7 kg    BMI:  Body mass index is 26.14 kg/m.  Estimated Nutritional Needs:   Kcal:  1850-2050  Protein:  85-100 grams  Fluid:  1.8-2.0 L    12/25/20, MS, RD, LDN Inpatient Clinical Dietitian Please see [facility] for contact information.

## 2020-12-24 NOTE — Progress Notes (Signed)
Physical Therapy Session Note  Patient Details  Name: Daniel Cline MRN: [id number] Date of Birth: 1956/01/21  Today's Date: 12/24/2020 PT Individual Time: 0900-1000 PT Individual Time Calculation (min): 60 min   Short Term Goals: Week 1:  PT Short Term Goal 1 (Week 1): Pt will tolerate sitting in appropriate wc x 1 hr to allow for participation in therapy session PT Short Term Goal 1 - Progress (Week 1): Met PT Short Term Goal 2 (Week 1): Pt will roll L/R w/mod assist and cues PT Short Term Goal 2 - Progress (Week 1): Met PT Short Term Goal 3 (Week 1): Pt will tolerate sitting on edge of bed/mat x 10 min w/occasional repositioning if needed and mod assist PT Short Term Goal 3 - Progress (Week 1): Not met Week 2:  PT Short Term Goal 1 (Week 2): pt to demonstrate upright sitting position at least ~75 degrees to increase safety with transfers and sitting tolerance PT Short Term Goal 1 - Progress (Week 2): Not met PT Short Term Goal 2 (Week 2): pt to demonstrate supine>sit mod A x1 PT Short Term Goal 2 - Progress (Week 2): Not met PT Short Term Goal 3 (Week 2): Pt will tolerate sitting on edge of bed/mat x 10 min w/occasional repositioning if needed and mod assist PT Short Term Goal 3 - Progress (Week 2): Not met Week 3:  PT Short Term Goal 1 (Week 3): pt to demonstrate upright sitting position at least ~75 degrees to increase safety with transfers and sitting tolerance PT Short Term Goal 1 - Progress (Week 3): Met PT Short Term Goal 2 (Week 3): pt to demonstrate supine>sit mod A x1 PT Short Term Goal 2 - Progress (Week 3): Not met PT Short Term Goal 3 (Week 3): Pt will tolerate sitting on edge of bed/mat x 10 min w/occasional repositioning if needed and mod assist PT Short Term Goal 3 - Progress (Week 3): Revised due to lack of progress Week 4:  PT Short Term Goal 1 (Week 4): pt to demonstrate supine>sit mod A x1 PT Short Term Goal 2 (Week 4): pt to demonstrate functional transfer at  max A x1 for safety with DC home PT Short Term Goal 3 (Week 4): Pt will tolerate sitting on edge of bed/mat x 5 min w/occasional repositioning if needed and mod assist  Skilled Therapeutic Interventions/Progress Updates:    pt received in bed and agreeable to therapy. Pt directed in rolling to Lt with min A and use of bed rail, the max A for trunk support into sitting at EOB, extra time spent on cuing pt for improved technique, setup, and LUE hand placement to assist in pushing trunk into sitting with rehab tech present providing min A-mod A, once in sitting mod A for initial sitting balance and improved to min, pt able to tolerate sitting for over one min without expressing increased pain. PT attempted to encourage pt to attempt transferring to standing with x2 assist and pt then reported greatly increased pain and demonstrated posterior lean and reported he needed to lay down to improve pain. Pt returned to supine max A for trunk and pt able to bring BLE onto bed. Pt then required prolonged rest break and then directed in additional rep of transferring to EOB max A for trunk support into sitting. Pt tolerated sitting upright with DIL now present to assist with posterior lean as needed and provide emotional support. Pt again denied to attempt standing and tolerated ~1  min sitting EOB at mod A. Pt returned to supine, mod A and required max A x2 for upward scooting. Pt educated on progression with therapy for improved safety with transfers and mobility at home. Pt reported he was unable to attempt transferring to EOB again and directed in long sitting in bed with pt able to achieve at least 65-70 degrees upright with one UE support to improve balance and positioning. Pt completed x3 and requested to rest against the back of HOB. Pt directed and educated in proper use of incentive spirometer with pt able to achieve good breathing inhalation and completed with good technique x10. Pt then left in supine, All needs  in reach and in good condition. Call light in hand.  And alarm set DIL present.   Therapy Documentation Precautions:  Precautions Precautions: Cervical,Fall Required Braces or Orthoses: Cervical Brace Cervical Brace: Soft collar,For comfort Restrictions Weight Bearing Restrictions: No General:   Vital Signs:   Pain:   Mobility:   Locomotion :    Trunk/Postural Assessment :    Balance:   Exercises:   Other Treatments:      Therapy/Group: Individual Therapy  [name] 12/24/2020, 1:22 PM

## 2020-12-24 NOTE — Progress Notes (Signed)
Occupational Therapy Weekly Progress Note  Patient Details  Name: Daniel Cline MRN: [id number] Date of Birth: [birth year]  Beginning of progress report period: December 16, 2020 End of progress report period: December 24, 2020  Today's Date: 12/24/2020 OT Individual Time: 1100-1157 OT Individual Time Calculation (min): 57 min    Patient did not have any goals for this reporting period as he is staying longer than originally planned as he got an extension d/t insurance and medical changes. Pt continues to be Mod/Max with LB bathing/dressing, Min A for rolling bed level for ADL, Max A for UB dressing and Mod for UB bathing, Supervision for oral hygiene and self feeding using AE. Continues to be significantly limited by pain in neck, does not wear collar, but is performing supine <> sits with 2 helpers and can tolerate up to 1 minute at times. Plan to continue hands on family ed in prep for DC home with son and DIL.   Patient continues to demonstrate the following deficits: muscle weakness, decreased cardiorespiratoy endurance, impaired timing and sequencing, abnormal tone, unbalanced muscle activation, decreased coordination and decreased motor planning, decreased initiation, decreased awareness, decreased problem solving, decreased safety awareness and delayed processing and decreased sitting balance, decreased postural control and decreased balance strategies and therefore will continue to benefit from skilled OT intervention to enhance overall performance with BADL.  Patient not progressing toward long term goals.  See goal revision..  Plan of care revisions: Including downgrading LB bathing/dressing goals and eliminating IADL goals as no longer applicable.  OT Short Term Goals Week 3:  OT Short Term Goal 1 (Week 3): STGs = LTGs d/t ELOS OT Short Term Goal 1 - Progress (Week 3): Progressing toward goal Week 4:  OT Short Term Goal 1 (Week 4): STGs = LTGs d/t ELOS  Skilled Therapeutic  Interventions/Progress Updates:    Pt greeted at time of session supine in bed with daughter in law Herbert Seta present who remained throughout session. Focus of session initially on discussion for DC planning for DME, hospital bed, etc. Pt performing oral hygiene HOB inclined with Supervision using build up handle. Warm up for BLEs for hip/knee flexion with 5 sec static hold 1x10 each side to promote ability to perform LB dressing bed level. Supine > side lying on L side Min A, side lying > sit with 2 helpers. Able to tolerate approx 20 seconds before neck pain too limiting and returned to supine. Scoot up in bed 2 helpers. Pt transitioned into long sitting with chuck pad 3 helpers and therapist suppoting trunk and remained at approx 40 degrees for approx 15 minutes with max encouragement to drink mag citrate, able to drink approx 1/2-3/4 of drink before sudden emesis, providing bucket for emesis and hygiene for face. Nursing aware, present and hand off to nursing.   Therapy Documentation Precautions:  Precautions Precautions: Cervical,Fall Required Braces or Orthoses: Cervical Brace Cervical Brace: Soft collar,For comfort Restrictions Weight Bearing Restrictions: No     Therapy/Group: Individual Therapy  Erasmo Score 12/24/2020, 12:25 PM

## 2020-12-24 NOTE — Progress Notes (Signed)
Patient ID: Daniel Cline, male   DOB: 30-Mar-1956, 65 y.o.   MRN: [id number]  Spoke with Heather-daughter in-law to discuss equipment and price of rental hospital bed along with looking for other pieces of equipment at [employer] and [employer] also yard [employer]. She will this weekend. Will continue to work on discharge needs.

## 2020-12-24 NOTE — Progress Notes (Signed)
Occupational Therapy Session Note  Patient Details  Name: Daniel Cline MRN: [id number] Date of Birth: 01-Oct-1955  Today's Date: 12/24/2020 OT Individual Time: 6967-8938 OT Individual Time Calculation (min): 28 min    Short Term Goals: Week 3:  OT Short Term Goal 1 (Week 3): STGs = LTGs d/t ELOS OT Short Term Goal 1 - Progress (Week 3): Progressing toward goal  Skilled Therapeutic Interventions/Progress Updates:    Pt in bed during session with nursing in to administer meds.  Pt with increased pain in his neck and the left shoulder.  He was able to complete BUE light exercises with use of the 1 lb dowel rod with HOB elevated to around 40 degrees.  He needed min assist to achieve bilateral shoulder flexion to approximately 90 degrees with pain reported in the left shoulder.  He completed 3 sets of 5 reps for this as well as 2 sets of 10 reps for bilateral elbow flexion.  Attempted PROM of the left shoulder as well to assess ROM with pt not being able to tolerate shoulder flexion past approximately 100 degrees.  Increased tightness noted, so did noted so did not try to push further.  Pt left in bed at end of session with nursing present.  Applied hot pack to the left shoulder for pain relief as well.  Call button and phone in reach.    Therapy Documentation Precautions:  Precautions Precautions: Cervical,Fall Precaution Booklet Issued: No Required Braces or Orthoses: Cervical Brace Cervical Brace: Soft collar,For comfort Restrictions Weight Bearing Restrictions: No   Pain: Pain Assessment Pain Scale: Faces Faces Pain Scale: Hurts little more Pain Type: Surgical pain Pain Location: Shoulder Pain Orientation: Left Pain Descriptors / Indicators: Discomfort Pain Onset: With Activity Pain Intervention(s): Repositioned ADL: See Care Tool Section for some details of mobility and selfcare  Therapy/Group: Individual Therapy  Daniel Cline OTR/L 12/24/2020, 4:51 PM

## 2020-12-24 NOTE — Progress Notes (Signed)
Patient ID: Daniel Cline, male   DOB: December 03, 1955, 65 y.o.   MRN: [id number]    Diagnosis code:G06.1, G95.9  Height:5'6  Weight:166 lbs   Patient has epidural abscess and cervical myelopathy which requires his upper trunk to be positioned in ways not feasible with a normal bed.  Head must be elevated at least 30 %.  His upper and lower extremities require frequent and immediate changes in body position which cannot be achieved with a normal bed. Length of need 5-8 months

## 2020-12-25 LAB — GLUCOSE, CAPILLARY
Glucose-Capillary: 119 mg/dL — ABNORMAL HIGH (ref 70–99)
Glucose-Capillary: 123 mg/dL — ABNORMAL HIGH (ref 70–99)
Glucose-Capillary: 151 mg/dL — ABNORMAL HIGH (ref 70–99)
Glucose-Capillary: 101 mg/dL — ABNORMAL HIGH (ref 70–99)

## 2020-12-25 NOTE — Progress Notes (Signed)
Occupational Therapy Session Note  Patient Details  Name: Daniel Cline MRN: [id number] Date of Birth: 1955/09/15  Today's Date: 12/25/2020 OT Individual Time: 0900-0957 OT Individual Time Calculation (min): 57 min   Skilled Therapeutic Interventions/Progress Updates:    Pt greeted in bed, premedicated for neck pain via pain patches. He did not want to participate in bathing today, agreeable to start session by donning his shorts. Pt able to utilize reclined figure 4 position to thread the Lt LE into his shorts, needing Min A to start him off. Mod A for threading the Rt LE with foley. Instructed pt on bridge technique however he just performed small rolls Rt>Lt, pulling up pants with Mod A overall. To work on UB/LB weightbearing and upright tolerance, pt used the tilt table during session. Min-Mod A of 1 for rolling Rt>Lt to position maxi slides and +2 assist for scooting pt onto table. No s/s orthostatics while participating, pt had minimal c/o nausea which is normal for him, tilt table use did not increase nausea. Pt tilted to 20 degrees and 30 degrees. BP at 20 degrees 98/66, BP at 30 degrees 75/61 and then 85/61 when reassessed after a few minutes. We listened to meaningful music, pt tapping toes with cues and smiling. Pt weightbearing through both arms using the tile table handles. At end of session, +2 for scooting back to bed and for repositioning for comfort. Pt remained in bed at close of session, all needs within reach and bed alarm set. He reported that nausea was ok and that he did not need the emesis bag with him in bed.   Therapy Documentation Precautions:  Precautions Precautions: Cervical,Fall Precaution Booklet Issued: No Required Braces or Orthoses: Cervical Brace Cervical Brace: Soft collar,For comfort Restrictions Weight Bearing Restrictions: No Vital Signs: Therapy Vitals Temp: 98 F (36.7 C) Pulse Rate: 74 Resp: 19 BP: (!) 105/57 Patient Position (if  appropriate): Lying Oxygen Therapy SpO2: 96 % O2 Device: Room Air ADL: ADL Eating: Not assessed Grooming: Not assessed Upper Body Bathing: Maximal assistance Where Assessed-Upper Body Bathing: Bed level Lower Body Bathing: Dependent Where Assessed-Lower Body Bathing: Bed level Upper Body Dressing: Dependent Lower Body Dressing: Dependent Where Assessed-Lower Body Dressing: Bed level Toileting: Not assessed Toilet Transfer: Not assessed Tub/Shower Transfer: Not assessed Walk-In Shower Transfer: Not assessed      Therapy/Group: Individual Therapy  Vania Rosero A Arihanna Estabrook 12/25/2020, 3:18 PM

## 2020-12-25 NOTE — Progress Notes (Signed)
PROGRESS NOTE   Subjective/Complaints:  Pt reports vomited with Mg citrate yesterday, but got some down- had 2 BM yesterday and 1 this AM- and nausea much improved.   Sleepy this AM- didn't sleep well "for some reason". Pain is stable- doing a little better- con't regimen   ROS:   Pt denies SOB, abd pain, CP, N/V/C/D, and vision changes    Objective:   DG Abd 1 View  Result Date: 12/24/2020 CLINICAL DATA:  Postoperative ileus. EXAM: ABDOMEN - 1 VIEW COMPARISON:  12/21/2020. FINDINGS: Interval improvement in gaseous distension of the small bowel. On today's study there is a moderate stool burden noted throughout the colon up to the level of the rectum. Stomach appears nondistended. IMPRESSION: 1. Interval improvement in gaseous distension of the small bowel. 2. Moderate stool burden throughout the colon compatible with constipation. Electronically Signed   By: Kerby Moors M.D.   On: 12/24/2020 10:37   No results for input(s): WBC, HGB, HCT, PLT in the last 72 hours. No results for input(s): NA, K, CL, CO2, GLUCOSE, BUN, CREATININE, CALCIUM in the last 72 hours.  Intake/Output Summary (Last 24 hours) at 12/25/2020 0840 Last data filed at 12/25/2020 0447 Gross per 24 hour  Intake 0 ml  Output 825 ml  Net -825 ml        Physical Exam: Vital Signs Blood pressure 106/65, pulse 75, temperature 98.7 F (37.1 C), temperature source Oral, resp. rate 18, height _0  (1.702 m), weight 75.7 kg, SpO2 96 %.       General: awake, alert, appropriate, laying supine in bed- hasn't had liquid diet yet this AM; NAD HENT: conjugate gaze; oropharynx moist CV: regular rate; no JVD Pulmonary: CTA B/L; no W/R/R- good air movement GI: soft, NT; much less distended-zero distension actually; hypoactive BS Psychiatric: amore interactive, in spite of sedation Neurological: more alert, HOH Musculoskeletal:     Comments: LUE-strength in  his Left arm is 4-/5-in the deltoid, bicep, tricep, wrist extension, grip, and finger abduction RUE-deltoid bicep and tricep are 3/5, wrist extension and grip are 3-/5, LLE-4/5 in hip flexion, knee extension, and dorsiflexion as well as plantarflexion RLE-strength is 4-/5-in the same muscles TTP over L shoulder and L upper trap areas Skin:    General: Skin is warm and dry.     Comments:  PICC line in his left upper extremity looks good  Neurological:  He reports his light touch is intact in all 4 extremities He has no lower extremity clonus bilaterally  Assessment/Plan: 1. Functional deficits which require 3+ hours per day of interdisciplinary therapy in a comprehensive inpatient rehab setting.  Physiatrist is providing close team supervision and 24 hour management of active medical problems listed below.  Physiatrist and rehab team continue to assess barriers to discharge/monitor patient progress toward functional and medical goals  Care Tool:  Bathing  Bathing activity did not occur: Safety/medical concerns (Per OT eval on medical hold) Body parts bathed by patient: Chest,Abdomen,Front perineal area,Right upper leg,Left upper leg,Face,Left arm   Body parts bathed by helper: Right arm,Buttocks,Right lower leg,Left lower leg     Bathing assist Assist Level: Maximal Assistance - Patient 24 - 49%  Upper Body Dressing/Undressing Upper body dressing   What is the patient wearing?: Pull over shirt    Upper body assist Assist Level: Maximal Assistance - Patient 25 - 49%    Lower Body Dressing/Undressing Lower body dressing      What is the patient wearing?: Pants     Lower body assist Assist for lower body dressing: Moderate Assistance - Patient 50 - 74%     Toileting Toileting    Toileting assist Assist for toileting: Dependent - Patient 0% (per staff  report, declined at eval. also limited by pain) Assistive Device Comment: foley   Transfers Chair/bed  transfer  Transfers assist  Chair/bed transfer activity did not occur: Refused  Chair/bed transfer assist level: Dependent - Patient 0% (lateral slide)     Locomotion Ambulation   Ambulation assist   Ambulation activity did not occur: Safety/medical concerns          Walk 10 feet activity   Assist  Walk 10 feet activity did not occur: Safety/medical concerns        Walk 50 feet activity   Assist Walk 50 feet with 2 turns activity did not occur: Safety/medical concerns         Walk 150 feet activity   Assist Walk 150 feet activity did not occur: Safety/medical concerns         Walk 10 feet on uneven surface  activity   Assist Walk 10 feet on uneven surfaces activity did not occur: Safety/medical concerns         Wheelchair     Assist Will patient use wheelchair at discharge?: Yes Type of Wheelchair: Manual Wheelchair activity did not occur: Refused  Wheelchair assist level: Dependent - Patient 0%      Wheelchair 50 feet with 2 turns activity    Assist    Wheelchair 50 feet with 2 turns activity did not occur: Refused   Assist Level: Dependent - Patient 0%   Wheelchair 150 feet activity     Assist  Wheelchair 150 feet activity did not occur: Refused   Assist Level: Dependent - Patient 0%   Blood pressure 106/65, pulse 75, temperature 98.7 F (37.1 C), temperature source Oral, resp. rate 18, height _0  (1.702 m), weight 75.7 kg, SpO2 96 %.  Medical Problem List and Plan: 1.  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  Acute DVT in peroneal/soleus: eliquis restarted 3. Pain Management: Patient is already receiving oxycodone as needed; we started tramadol scheduled but that is  not working; will DC tramadol and schedule MS Contin 15 mg twice daily  - on Valium and Firoicet prn per NSU  D/c cymbalta which may be causing emesis.   Continue MS Contin as pain and ability to participate in therapy limited by pain. XR cervical spine stable, soft collar ordered.   4/27- pain sounds like down to 5/10 after d/w pt what he means- it's not 8/10 based on description, con't regimen  4/29- pt working better with therapy- slowly- and pain is "slightly better"- so guessing around 4/10 at rest 4. Mood: - Messaged SW regarding adding Mr. Bogart  to Neuropsychologyschedule due to uncontrolled pain and new SCI.   4/27- will add Buspar 5 mg TID and change as required  4/29- less anxious today- con't regimen             -antipsychotic agents: N/A 5. Neuropsych: This patient is capable of making decisions on his own behalf. 6. Skin/Wound Care: We will change his honeycomb dressing on his neck to daily and as needed; will also change the dressing on his right forearm to just a dry dressing and change as needed. Less drainage today, will not require I&D  4/23 minimal drainage, almost healed incision- continue follow/monitor 7. Fluids/Electrolytes/Nutrition: We will check labs on Monday 8. Uncontrolled Diabetes II- A1c of 13.9-continue patient's Levemir 10 units nightly and sliding scale insulin  4/23- CBGs bordereline. increase Levemir to 18 units nightly.    4/29- BGs 102-119- con't regimen 9. Hypertension hx with Orthostatic hypotension-patient is on HCTZ 25 mg daily as well as Norvasc 10 mg daily and lisinopril 40 mg daily and Lopressor 50 mg twice daily-blood pressure is well controlled we will continue these medications and monitor especially with increased activity  4/15: Have reduced Lisinopril, HCTZ, Norvasc and con't Lopressor- still having episode of orthostatic hypotension- might need additional reduction- don't decreased B Blocker due to tachycardia frequently  4/29- BP controlled- no  Orthostasis- con't regimen Vitals:   12/24/20 2037 12/25/20 0435  BP: 101/67 106/65  Pulse: 86 75  Resp: 14 18  Temp: 98.3 F (36.8 C) 98.7 F (37.1 C)  SpO2: 95% 96%    10.  Neurogenic bowel-we will look to start him on a bowel program nightly after dinner with Dulcolax suppository  4/15- KUB showed diffuse constipation- will give Sorbitol 60 ml to clean him out  4/25- full of stool AGAIN- even though on bowel program and on bowel meds- not sure if refusing?- Has diffuse ileus and severe constipation-will give Sorbitol again 60 ml= put on liquid diet- and increase bowel meds  4/26- pt thought was "OK" to refuse bowel meds and bowel program- explained is so ful of stool, needs 10+ BMs to clean out- ordered Mg citrate- and f/u KUB  4/27- KUB not much better- but will con't liquid diet- doesn't want NG tube- to finish Mg citrate today- d/w pt and family.   4/28- will recheck KUB today and also ordered Mg citrate again and Fleets enema at 3pm- pt said IF KUB worse, willing to do NGT, but only if worse  4/29- moderate stool on new KUB- ileus resolved- and had 3 BMs since KUB done- will switch back to regular diet and con't bowel program 12.  Acute DVT in the soleus and peroneal-we will change his Lovenox to apixaban/Eliquis 10 mg BID x 1 week, then 5 mg BID.  13.  Neurogenic bladder-we will continue his Foley for now and start Flomax tomorrow 0.4 mg q. supper and see if he is able to start to void;  we might need to titrate this as required- will try and remove foley next week.   4/27- will start Chronic foley- since pt doesn't want family cathing him.   14. Hypokalemia: supplement 65m BID today and repeat BMP tomorrow  4/8- K+ 3.5- borderline low- will recheck Monday  4/11- K+ 3.3 will replete 40 mEq x2 and recheck Thursday  15. Epidural abscess-  4/5- on  Cefazolin IV for prolonged IV ABX- pt's WBC down to 15.5 (was 14k yesterday, but 20.8 3 days ago)- low grade temps up  to 100.2 in last 24  hours-will con't regimen   4/6-White count is down slightly to 14K-we will continue IV antibiotics and check CRP and ESR-weekly  4/7- afebrile- will recheck labs in AM  4/8- WBC stable- con't IV ABX.   4/11- WBC down to 11.3- ESR >140 and CRP 10.9- heading a little better- con't to monitor.   4/15 WBC 9.8, monitor weekly 16. Dysphagia?  D2 diet due to lack of teeth, not due to dysphagia- doesn't need pills crushed. 17. Vomiting: d/c cymbalta which may be contributing. IV zofran. KUB ordered and shows no abdominal pathology.   -4/23 improved off iron, reglan dc'ed  4/25- still nauseated- due to ileus  4/26- on liquid diet- con't regimen  4/27- ileus not much better- conn't liquid diet/Mg citrate-   4/28- as above- only NGT if KUB worse- pt agreed to this compromise- added another dose mg citrate and fleets enema.    4/29- ileus resolved 18. Atelectasis: Recommend incentive spirometer to improve breathing strength and ability over time. Provided incentive spirometer education. 19. Lethargy: decreased valium to 24m BID.    LOS: 27 days A FACE TO FACE EVALUATION WAS PERFORMED  Suzzane Quilter 12/25/2020, 8:40 AM

## 2020-12-25 NOTE — Progress Notes (Signed)
Physical Therapy Session Note  Patient Details  Name: Daniel Cline MRN: [id number] Date of Birth: [birth year]  Today's Date: 12/25/2020 PT Individual Time: 1300-1400 PT Individual Time Calculation (min): 60 min   Short Term Goals: Week 1:  PT Short Term Goal 1 (Week 1): Pt will tolerate sitting in appropriate wc x 1 hr to allow for participation in therapy session PT Short Term Goal 1 - Progress (Week 1): Met PT Short Term Goal 2 (Week 1): Pt will roll L/R w/mod assist and cues PT Short Term Goal 2 - Progress (Week 1): Met PT Short Term Goal 3 (Week 1): Pt will tolerate sitting on edge of bed/mat x 10 min w/occasional repositioning if needed and mod assist PT Short Term Goal 3 - Progress (Week 1): Not met Week 2:  PT Short Term Goal 1 (Week 2): pt to demonstrate upright sitting position at least ~75 degrees to increase safety with transfers and sitting tolerance PT Short Term Goal 1 - Progress (Week 2): Not met PT Short Term Goal 2 (Week 2): pt to demonstrate supine>sit mod A x1 PT Short Term Goal 2 - Progress (Week 2): Not met PT Short Term Goal 3 (Week 2): Pt will tolerate sitting on edge of bed/mat x 10 min w/occasional repositioning if needed and mod assist PT Short Term Goal 3 - Progress (Week 2): Not met Week 3:  PT Short Term Goal 1 (Week 3): pt to demonstrate upright sitting position at least ~75 degrees to increase safety with transfers and sitting tolerance PT Short Term Goal 1 - Progress (Week 3): Met PT Short Term Goal 2 (Week 3): pt to demonstrate supine>sit mod A x1 PT Short Term Goal 2 - Progress (Week 3): Not met PT Short Term Goal 3 (Week 3): Pt will tolerate sitting on edge of bed/mat x 10 min w/occasional repositioning if needed and mod assist PT Short Term Goal 3 - Progress (Week 3): Revised due to lack of progress Week 4:  PT Short Term Goal 1 (Week 4): pt to demonstrate supine>sit mod A x1 PT Short Term Goal 2 (Week 4): pt to demonstrate functional transfer at  max A x1 for safety with DC home PT Short Term Goal 3 (Week 4): Pt will tolerate sitting on edge of bed/mat x 5 min w/occasional repositioning if needed and mod assist  Skilled Therapeutic Interventions/Progress Updates:    pt received in bed and agreeable to therapy. Pt reported he felt better, had gotten to eat lunch and was much less nauseous, pt reported his pain also felt better but did not rank. Pt aware of pending DC date of next week and when asked plans for transferring, pt reported, "We will have to figure it out". PT educated pt on importance of attempting transfer training prior to DC home for safety and increased I. Pt agreed and reported "I'll try but if it hurts you have to lay me back down." pt directed in supine>sit max A for trunk support with pt participation limited with trunk but able to bring BLE off bed in sidelying. Pt directed in static sitting for 1.5 mins mod A, pt setup for transfer and reported "I'll try". PT in front of pt for safety, pt directed in anterior momentum to attempt to shift weight forward and attempt to increase I with ascending from bed however with small shift forward and light knee blocking pt demonstrated heavy posterior push and yelled "I need to lay down!"  Max A x2 provided  to safely return pt to supine. Pt required prolonged rest break and while resting educated and demonstrated on Stedy use to attempt to make pt feel more comfortable attempting standing. Pt agreed to attempt. Pt directed in supine>sit from side lying and with min A for trunk support able to maintain static sitting however once Stedy in place and pt directed to attempt standing, pt reported he was in too much pain and required max A x2 to return to supine. Prolonged rest break required again and pt agreeable to attempt transfer to EOB, however with pt in place at side lying, PT attempted to assist trunk and transfer to sitting EOB and pt did not demonstrate active participation with mobility and  for safety returned to supine, pt reported "I can't do that again it hurts too bad." pt educated again on importance of progressing to transfers and standing. Pt reported he understood but there was nothing he could do with pain like this. Pt directed in x4 rolling to R and L directions with use of bed rails CGA grossly with VC for technique. Pt reported he liked doing exercises in the bed and PT educated pt on importance of core and trunk strengthening for improved balance and transfers however pt refused additional training at this time. Pt left in bed, All needs in reach and in good condition. Call light in hand.  And alarm set. Pt missed 15 mins of PT 2/2 pain and unwillingness to participate in additional therapeutic intervention.   Therapy Documentation Precautions:  Precautions Precautions: Cervical,Fall Precaution Booklet Issued: No Required Braces or Orthoses: Cervical Brace Cervical Brace: Soft collar,For comfort Restrictions Weight Bearing Restrictions: No General: PT Amount of Missed Time (min): 15 Minutes PT Missed Treatment Reason: Pain;Patient unwilling to participate Vital Signs:   Pain:   Mobility:   Locomotion :    Trunk/Postural Assessment :    Balance:   Exercises:   Other Treatments:      Therapy/Group: Individual Therapy  [name] 12/25/2020, 2:08 PM

## 2020-12-26 LAB — GLUCOSE, CAPILLARY
Glucose-Capillary: 135 mg/dL — ABNORMAL HIGH (ref 70–99)
Glucose-Capillary: 117 mg/dL — ABNORMAL HIGH (ref 70–99)
Glucose-Capillary: 118 mg/dL — ABNORMAL HIGH (ref 70–99)
Glucose-Capillary: 172 mg/dL — ABNORMAL HIGH (ref 70–99)

## 2020-12-26 MED ORDER — SODIUM CHLORIDE 0.9 % IV SOLN: INTRAVENOUS | Status: DC | PRN

## 2020-12-26 MED ORDER — AMLODIPINE BESYLATE 2.5 MG PO TABS
2.5000 mg | ORAL_TABLET | Freq: Every day | ORAL | Status: DC
  Administered 2020-12-27 – 2020-12-31 (×5): 2.5 mg via ORAL
  Filled 2020-12-26 (×5): qty 1

## 2020-12-26 NOTE — Progress Notes (Signed)
PROGRESS NOTE   Subjective/Complaints: Feeling better today BP was soft to 79G systolic, decreased amlodipine to 2.64m Requested kpad for his shoulder/upper back muscle pain Son at bedside today  ROS:   Pt denies SOB, abd pain, CP, N/V/C/D, and vision changes    Objective:   No results found. No results for input(s): WBC, HGB, HCT, PLT in the last 72 hours. No results for input(s): NA, K, CL, CO2, GLUCOSE, BUN, CREATININE, CALCIUM in the last 72 hours.  Intake/Output Summary (Last 24 hours) at 12/26/2020 1608 Last data filed at 12/26/2020 1240 Gross per 24 hour  Intake 120 ml  Output 576 ml  Net -456 ml        Physical Exam: Vital Signs Blood pressure 103/65, pulse 82, temperature 98.1 F (36.7 C), temperature source Oral, resp. rate 18, height '5\' 7"'  (1.702 m), weight 75.7 kg, SpO2 94 %. Gen: no distress, normal appearing HEENT: oral mucosa pink and moist, NCAT Cardio: Reg rate Chest: normal effort, normal rate of breathing Abd: soft, non-distended Ext: no edema Psych: pleasant, normal affect Musculoskeletal:     Comments: LUE-strength in his Left arm is 4-/5-in the deltoid, bicep, tricep, wrist extension, grip, and finger abduction RUE-deltoid bicep and tricep are 3/5, wrist extension and grip are 3-/5, LLE-4/5 in hip flexion, knee extension, and dorsiflexion as well as plantarflexion RLE-strength is 4-/5-in the same muscles TTP over L shoulder and L upper trap areas Skin:    General: Skin is warm and dry.     Comments:  PICC line in his left upper extremity looks good  Neurological:  He reports his light touch is intact in all 4 extremities He has no lower extremity clonus bilaterally  Assessment/Plan: 1. Functional deficits which require 3+ hours per day of interdisciplinary therapy in a comprehensive inpatient rehab setting.  Physiatrist is providing close team supervision and 24 hour management of  active medical problems listed below.  Physiatrist and rehab team continue to assess barriers to discharge/monitor patient progress toward functional and medical goals  Care Tool:  Bathing  Bathing activity did not occur: Safety/medical concerns (Per OT eval on medical hold) Body parts bathed by patient: Chest,Abdomen,Front perineal area,Right upper leg,Left upper leg,Face,Left arm   Body parts bathed by helper: Right arm,Buttocks,Right lower leg,Left lower leg     Bathing assist Assist Level: Maximal Assistance - Patient 24 - 49%     Upper Body Dressing/Undressing Upper body dressing   What is the patient wearing?: Pull over shirt    Upper body assist Assist Level: Maximal Assistance - Patient 25 - 49%    Lower Body Dressing/Undressing Lower body dressing      What is the patient wearing?: Pants     Lower body assist Assist for lower body dressing: Moderate Assistance - Patient 50 - 74%     Toileting Toileting    Toileting assist Assist for toileting: Dependent - Patient 0% (per staff  report, declined at eval. also limited by pain) Assistive Device Comment: foley   Transfers Chair/bed transfer  Transfers assist  Chair/bed transfer activity did not occur: Refused  Chair/bed transfer assist level: Dependent - Patient 0% (lateral slide)  Locomotion Ambulation   Ambulation assist   Ambulation activity did not occur: Safety/medical concerns          Walk 10 feet activity   Assist  Walk 10 feet activity did not occur: Safety/medical concerns        Walk 50 feet activity   Assist Walk 50 feet with 2 turns activity did not occur: Safety/medical concerns         Walk 150 feet activity   Assist Walk 150 feet activity did not occur: Safety/medical concerns         Walk 10 feet on uneven surface  activity   Assist Walk 10 feet on uneven surfaces activity did not occur: Safety/medical concerns         Wheelchair     Assist  Will patient use wheelchair at discharge?: Yes Type of Wheelchair: Manual Wheelchair activity did not occur: Refused  Wheelchair assist level: Dependent - Patient 0%      Wheelchair 50 feet with 2 turns activity    Assist    Wheelchair 50 feet with 2 turns activity did not occur: Refused   Assist Level: Dependent - Patient 0%   Wheelchair 150 feet activity     Assist  Wheelchair 150 feet activity did not occur: Refused   Assist Level: Dependent - Patient 0%   Blood pressure 103/65, pulse 82, temperature 98.1 F (36.7 C), temperature source Oral, resp. rate 18, height '5\' 7"'  (1.702 m), weight 75.7 kg, SpO2 94 %.  Medical Problem List and Plan: 1.  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  Acute DVT in peroneal/soleus: eliquis restarted 3. Pain Management: Patient is already receiving oxycodone as needed; we started tramadol scheduled but that is not working; will DC tramadol and schedule MS Contin 15 mg twice daily  - on Valium and Firoicet prn per NSU  D/c cymbalta which may be causing emesis.   Continue MS Contin as pain and ability to participate in therapy limited by pain. XR cervical spine stable, soft collar ordered.   4/27- pain sounds like down to 5/10 after d/w pt what he means- it's not 8/10 based on description, con't regimen  4/29- pt working better with therapy- slowly- and pain is "slightly better"- so guessing around 4/10 at rest  4/30: requested kpad be given for his upper back pain 4. Mood: - Messaged SW regarding adding Mr. Lascola to Neuropsychologyschedule due to uncontrolled pain and new SCI.   4/27- will add Buspar 5 mg TID and change as required  4/29-4/30- less anxious today- continue regimen              -antipsychotic agents: N/A 5. Neuropsych: This patient is capable of making decisions on his own behalf. 6. Skin/Wound Care: We will change his honeycomb dressing on his neck to daily and as needed; will also change the dressing on his right forearm to just a dry dressing and change as needed. Less drainage today, will not require I&D  4/23 minimal drainage, almost healed incision- continue follow/monitor 7. Fluids/Electrolytes/Nutrition: We will check labs on Monday 8. Uncontrolled Diabetes II- A1c of 13.9-continue  patient's Levemir 10 units nightly and sliding scale insulin  4/23- CBGs bordereline. increase Levemir to 18 units nightly.    4/29- BGs 102-119- con't regimen 9. Hypertension hx with Orthostatic hypotension-patient is on HCTZ 25 mg daily as well as Norvasc 10 mg daily and lisinopril 40 mg daily and Lopressor 50 mg twice daily-blood pressure is well controlled we will continue these medications and monitor especially with increased activity  4/15: Have reduced Lisinopril, HCTZ, Norvasc and con't Lopressor- still having episode of orthostatic hypotension- might need additional reduction- don't decreased B Blocker due to tachycardia frequently  4/29- BP controlled- no Orthostasis- con't regimen  4/30: SBP soft to 70s, decrease norvasc to 2.3m Vitals:   12/26/20 1516 12/26/20 1533  BP: 105/67 103/65  Pulse: 80 82  Resp: 17 18  Temp:  98.1 F (36.7 C)  SpO2: 95% 94%    10.  Neurogenic bowel-we will look to start him on a bowel program nightly after dinner with Dulcolax suppository  4/15- KUB showed diffuse constipation- will give Sorbitol 60 ml to clean him out  4/25- full of stool AGAIN- even though on bowel program and on bowel meds- not sure if refusing?- Has diffuse ileus and severe constipation-will give Sorbitol again 60 ml= put on liquid diet- and increase bowel meds  4/26- pt thought was "OK" to refuse bowel meds and bowel program- explained is so ful of stool, needs 10+  BMs to clean out- ordered Mg citrate- and f/u KUB  4/27- KUB not much better- but will con't liquid diet- doesn't want NG tube- to finish Mg citrate today- d/w pt and family.   4/28- will recheck KUB today and also ordered Mg citrate again and Fleets enema at 3pm- pt said IF KUB worse, willing to do NGT, but only if worse  4/29- moderate stool on new KUB- ileus resolved- and had 3 BMs since KUB done- will switch back to regular diet and con't bowel program 12.  Acute DVT in the soleus and peroneal-we will change his Lovenox to apixaban/Eliquis 10 mg BID x 1 week, then 5 mg BID.  13.  Neurogenic bladder-we will continue his Foley for now and start Flomax tomorrow 0.4 mg q. supper and see if he is able to start to void;  we might need to titrate this as required- will try and remove foley next week.   4/27- will start Chronic foley- since pt doesn't want family cathing him.   14. Hypokalemia: supplement 473mBID today and repeat BMP tomorrow  4/8- K+ 3.5- borderline low- will recheck Monday  4/11- K+ 3.3 will replete 40 mEq x2 and recheck Thursday  15. Epidural abscess-  4/5- on  Cefazolin IV for prolonged IV ABX- pt's WBC down to 15.5 (was 14k yesterday, but 20.8 3 days ago)- low grade temps up to 100.2 in last 24 hours-will con't regimen   4/6-White count is down slightly to 14K-we will continue IV antibiotics and check CRP and ESR-weekly  4/7- afebrile- will recheck labs in AM  4/8- WBC stable- con't IV ABX.   4/11- WBC down to 11.3- ESR >140 and CRP 10.9- heading a little better- con't to monitor.   4/15 WBC 9.8, monitor weekly 16. Dysphagia?  D2 diet due to lack of teeth, not due to dysphagia- doesn't need pills crushed. 17. Vomiting: d/c cymbalta which may be contributing. IV zofran. KUB ordered and shows no abdominal pathology.   -4/23 improved off iron, reglan dc'ed  4/25- still nauseated- due to  ileus  4/26- on liquid diet- con't regimen  4/27- ileus not much better- conn't liquid  diet/Mg citrate-   4/28- as above- only NGT if KUB worse- pt agreed to this compromise- added another dose mg citrate and fleets enema.    4/29- ileus resolved 18. Atelectasis: Recommend incentive spirometer to improve breathing strength and ability over time. Provided incentive spirometer education. 19. Lethargy: decreased valium to 61m BID.    LOS: 28 days A FACE TO FACE EVALUATION WAS PERFORMED  KMartha ClanP Fabiola Mudgett 12/26/2020, 4:08 PM

## 2020-12-27 LAB — GLUCOSE, CAPILLARY
Glucose-Capillary: 132 mg/dL — ABNORMAL HIGH (ref 70–99)
Glucose-Capillary: 170 mg/dL — ABNORMAL HIGH (ref 70–99)
Glucose-Capillary: 132 mg/dL — ABNORMAL HIGH (ref 70–99)
Glucose-Capillary: 153 mg/dL — ABNORMAL HIGH (ref 70–99)

## 2020-12-27 NOTE — Progress Notes (Signed)
PROGRESS NOTE   Subjective/Complaints: A little nauseous this morning, about to receive medications Patient's chart reviewed- No issues reported overnight Vitals signs stable Requested kpad for his neck pain  ROS:   Pt denies SOB, abd pain, CP, N/V/C/D, and vision changes, +pain in muscles of neck and upper back    Objective:   No results found. No results for input(s): WBC, HGB, HCT, PLT in the last 72 hours. No results for input(s): NA, K, CL, CO2, GLUCOSE, BUN, CREATININE, CALCIUM in the last 72 hours.  Intake/Output Summary (Last 24 hours) at 12/27/2020 1154 Last data filed at 12/26/2020 1805 Gross per 24 hour  Intake 140 ml  Output 275 ml  Net -135 ml        Physical Exam: Vital Signs Blood pressure 106/67, pulse 80, temperature 98.4 F (36.9 C), resp. rate 18, height '5\' 7"'  (1.702 m), weight 75.7 kg, SpO2 94 %. Gen: no distress, normal appearing HEENT: oral mucosa pink and moist, NCAT Cardio: Reg rate Chest: normal effort, normal rate of breathing Abd: soft, non-distended Ext: no edema Psych: pleasant, normal affect Musculoskeletal:     Comments: LUE-strength in his Left arm is 4-/5-in the deltoid, bicep, tricep, wrist extension, grip, and finger abduction RUE-deltoid bicep and tricep are 3/5, wrist extension and grip are 3-/5, LLE-4/5 in hip flexion, knee extension, and dorsiflexion as well as plantarflexion RLE-strength is 4-/5-in the same muscles TTP over L shoulder and L upper trap areas Skin:    General: Skin is warm and dry.     Comments:  PICC line in his left upper extremity looks good  Neurological:  He reports his light touch is intact in all 4 extremities He has no lower extremity clonus bilaterally  Assessment/Plan: 1. Functional deficits which require 3+ hours per day of interdisciplinary therapy in a comprehensive inpatient rehab setting.  Physiatrist is providing close team supervision  and 24 hour management of active medical problems listed below.  Physiatrist and rehab team continue to assess barriers to discharge/monitor patient progress toward functional and medical goals  Care Tool:  Bathing  Bathing activity did not occur: Safety/medical concerns (Per OT eval on medical hold) Body parts bathed by patient: Chest,Abdomen,Front perineal area,Right upper leg,Left upper leg,Face,Left arm   Body parts bathed by helper: Right arm,Buttocks,Right lower leg,Left lower leg     Bathing assist Assist Level: Maximal Assistance - Patient 24 - 49%     Upper Body Dressing/Undressing Upper body dressing   What is the patient wearing?: Pull over shirt    Upper body assist Assist Level: Maximal Assistance - Patient 25 - 49%    Lower Body Dressing/Undressing Lower body dressing      What is the patient wearing?: Pants     Lower body assist Assist for lower body dressing: Moderate Assistance - Patient 50 - 74%     Toileting Toileting    Toileting assist Assist for toileting: Dependent - Patient 0% (per staff  report, declined at eval. also limited by pain) Assistive Device Comment: foley   Transfers Chair/bed transfer  Transfers assist  Chair/bed transfer activity did not occur: Refused  Chair/bed transfer assist level: Dependent - Patient 0% (  lateral slide)     Locomotion Ambulation   Ambulation assist   Ambulation activity did not occur: Safety/medical concerns          Walk 10 feet activity   Assist  Walk 10 feet activity did not occur: Safety/medical concerns        Walk 50 feet activity   Assist Walk 50 feet with 2 turns activity did not occur: Safety/medical concerns         Walk 150 feet activity   Assist Walk 150 feet activity did not occur: Safety/medical concerns         Walk 10 feet on uneven surface  activity   Assist Walk 10 feet on uneven surfaces activity did not occur: Safety/medical concerns          Wheelchair     Assist Will patient use wheelchair at discharge?: Yes Type of Wheelchair: Manual Wheelchair activity did not occur: Refused  Wheelchair assist level: Dependent - Patient 0%      Wheelchair 50 feet with 2 turns activity    Assist    Wheelchair 50 feet with 2 turns activity did not occur: Refused   Assist Level: Dependent - Patient 0%   Wheelchair 150 feet activity     Assist  Wheelchair 150 feet activity did not occur: Refused   Assist Level: Dependent - Patient 0%   Blood pressure 106/67, pulse 80, temperature 98.4 F (36.9 C), resp. rate 18, height '5\' 7"'  (1.702 m), weight 75.7 kg, SpO2 94 %.  Medical Problem List and Plan: 1.  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  Acute DVT in peroneal/soleus: eliquis restarted 3. Pain Management: Patient is already receiving oxycodone as needed; we started tramadol scheduled but that is not working; will DC tramadol and schedule MS Contin 15 mg twice daily  - on Valium and Firoicet prn per NSU  D/c cymbalta which may be causing emesis.   Continue MS Contin as pain and ability to participate in therapy limited by pain. XR cervical spine stable, soft collar ordered.   4/27- pain sounds like down to 5/10 after d/w pt what he means- it's not 8/10 based on description, con't regimen  4/29- pt working better with therapy- slowly- and pain is "slightly better"- so guessing around 4/10 at rest  5/1: requested kpad be given for his upper back pain, continue 4. Mood: - Messaged SW regarding adding Mr. Smola to Neuropsychologyschedule due to uncontrolled pain and new SCI.   4/27- will add Buspar 5 mg TID and change as required  4/29-5/1- less anxious today- continue  regimen             -antipsychotic agents: N/A 5. Neuropsych: This patient is capable of making decisions on his own behalf. 6. Skin/Wound Care: We will change his honeycomb dressing on his neck to daily and as needed; will also change the dressing on his right forearm to just a dry dressing and change as needed. Less drainage today, will not require I&D  4/23 minimal drainage, almost healed incision- continue follow/monitor 7. Fluids/Electrolytes/Nutrition: We will check labs on Monday 8. Uncontrolled Diabetes  II- A1c of 13.9-continue patient's Levemir 10 units nightly and sliding scale insulin  4/23- CBGs bordereline. increase Levemir to 18 units nightly.    5/1- BGs 118-170- continue regimen 9. Hypertension hx with Orthostatic hypotension-patient is on HCTZ 25 mg daily as well as Norvasc 10 mg daily and lisinopril 40 mg daily and Lopressor 50 mg twice daily-blood pressure is well controlled we will continue these medications and monitor especially with increased activity  4/15: Have reduced Lisinopril, HCTZ, Norvasc and con't Lopressor- still having episode of orthostatic hypotension- might need additional reduction- don't decreased B Blocker due to tachycardia frequently  4/29- BP controlled- no Orthostasis- con't regimen  4/30: SBP soft to 70s, decrease norvasc to 2.21m Vitals:   12/26/20 2102 12/27/20 0413  BP: (!) 97/51 106/67  Pulse: 87 80  Resp: 18 18  Temp: 98.4 F (36.9 C) 98.4 F (36.9 C)  SpO2: 96% 94%    10.  Neurogenic bowel-we will look to start him on a bowel program nightly after dinner with Dulcolax suppository  4/15- KUB showed diffuse constipation- will give Sorbitol 60 ml to clean him out  4/25- full of stool AGAIN- even though on bowel program and on bowel meds- not sure if refusing?- Has diffuse ileus and severe constipation-will give Sorbitol again 60 ml= put on liquid diet- and increase bowel meds  4/26- pt thought was "OK" to refuse bowel meds and bowel program-  explained is so ful of stool, needs 10+ BMs to clean out- ordered Mg citrate- and f/u KUB  4/27- KUB not much better- but will con't liquid diet- doesn't want NG tube- to finish Mg citrate today- d/w pt and family.   4/28- will recheck KUB today and also ordered Mg citrate again and Fleets enema at 3pm- pt said IF KUB worse, willing to do NGT, but only if worse  4/29- moderate stool on new KUB- ileus resolved- and had 3 BMs since KUB done- will switch back to regular diet and con't bowel program 12.  Acute DVT in the soleus and peroneal-we will change his Lovenox to apixaban/Eliquis 10 mg BID x 1 week, then 5 mg BID.  13.  Neurogenic bladder-we will continue his Foley for now and start Flomax tomorrow 0.4 mg q. supper and see if he is able to start to void;  we might need to titrate this as required- will try and remove foley next week.   4/27- will start Chronic foley- since pt doesn't want family cathing him.   14. Hypokalemia: supplement 460mBID today and repeat BMP tomorrow  4/8- K+ 3.5- borderline low- will recheck Monday  4/11- K+ 3.3 will replete 40 mEq x2 and recheck Thursday  15. Epidural abscess-  4/5- on  Cefazolin IV for prolonged IV ABX- pt's WBC down to 15.5 (was 14k yesterday, but 20.8 3 days ago)- low grade temps up to 100.2 in last 24 hours-will con't regimen   4/6-White count is down slightly to 14K-we will continue IV antibiotics and check CRP and ESR-weekly  4/7- afebrile- will recheck labs in AM  4/8- WBC stable- con't IV ABX.   4/11- WBC down to 11.3- ESR >140 and CRP 10.9- heading a little better- con't to monitor.   4/15 WBC 9.8, monitor weekly 16. Dysphagia?  D2 diet due to lack of teeth, not due to dysphagia- doesn't need pills crushed. 17. Vomiting: d/c cymbalta which may be contributing. IV zofran. KUB ordered and shows no abdominal pathology.   -4/23 improved off iron,  reglan dc'ed  4/25- still nauseated- due to ileus  4/26- on liquid diet- con't regimen  4/27-  ileus not much better- conn't liquid diet/Mg citrate-   4/28- as above- only NGT if KUB worse- pt agreed to this compromise- added another dose mg citrate and fleets enema.    4/29- ileus resolved 18. Atelectasis: Recommend incentive spirometer to improve breathing strength and ability over time. Provided incentive spirometer education. 19. Lethargy: decreased valium to 76m BID.    LOS: 29 days A FACE TO FACE EVALUATION WAS PERFORMED  Sabrie Moritz P Stefhanie Kachmar 12/27/2020, 11:54 AM

## 2020-12-28 LAB — CBC WITH DIFFERENTIAL/PLATELET
Abs Immature Granulocytes: 0.05 10*3/uL (ref 0.00–0.07)
Basophils Absolute: 0.1 10*3/uL (ref 0.0–0.1)
Basophils Relative: 1 %
Eosinophils Absolute: 0.9 10*3/uL — ABNORMAL HIGH (ref 0.0–0.5)
Eosinophils Relative: 9 %
HCT: 35.3 % — ABNORMAL LOW (ref 39.0–52.0)
Hemoglobin: 11 g/dL — ABNORMAL LOW (ref 13.0–17.0)
Immature Granulocytes: 1 %
Lymphocytes Relative: 16 %
Lymphs Abs: 1.7 10*3/uL (ref 0.7–4.0)
MCH: 30.9 pg (ref 26.0–34.0)
MCHC: 31.2 g/dL (ref 30.0–36.0)
MCV: 99.2 fL (ref 80.0–100.0)
Monocytes Absolute: 1.2 10*3/uL — ABNORMAL HIGH (ref 0.1–1.0)
Monocytes Relative: 12 %
Neutro Abs: 6.5 10*3/uL (ref 1.7–7.7)
Neutrophils Relative %: 61 %
Platelets: 331 10*3/uL (ref 150–400)
RBC: 3.56 MIL/uL — ABNORMAL LOW (ref 4.22–5.81)
RDW: 14.4 % (ref 11.5–15.5)
WBC: 10.4 10*3/uL (ref 4.0–10.5)
nRBC: 0 % (ref 0.0–0.2)

## 2020-12-28 LAB — COMPREHENSIVE METABOLIC PANEL
ALT: 5 U/L (ref 0–44)
AST: 15 U/L (ref 15–41)
Albumin: 2.7 g/dL — ABNORMAL LOW (ref 3.5–5.0)
Alkaline Phosphatase: 92 U/L (ref 38–126)
Anion gap: 10 (ref 5–15)
BUN: 16 mg/dL (ref 8–23)
CO2: 27 mmol/L (ref 22–32)
Calcium: 9.1 mg/dL (ref 8.9–10.3)
Chloride: 97 mmol/L — ABNORMAL LOW (ref 98–111)
Creatinine, Ser: 0.82 mg/dL (ref 0.61–1.24)
GFR, Estimated: >60 mL/min (ref >60)
Glucose, Bld: 96 mg/dL (ref 70–99)
Potassium: 3.3 mmol/L — ABNORMAL LOW (ref 3.5–5.1)
Sodium: 134 mmol/L — ABNORMAL LOW (ref 135–145)
Total Bilirubin: 0.4 mg/dL (ref 0.3–1.2)
Total Protein: 7 g/dL (ref 6.5–8.1)

## 2020-12-28 LAB — GLUCOSE, CAPILLARY
Glucose-Capillary: 115 mg/dL — ABNORMAL HIGH (ref 70–99)
Glucose-Capillary: 124 mg/dL — ABNORMAL HIGH (ref 70–99)
Glucose-Capillary: 103 mg/dL — ABNORMAL HIGH (ref 70–99)
Glucose-Capillary: 110 mg/dL — ABNORMAL HIGH (ref 70–99)

## 2020-12-28 LAB — SEDIMENTATION RATE: Sed Rate: 82 mm/hr — ABNORMAL HIGH (ref 0–16)

## 2020-12-28 LAB — C-REACTIVE PROTEIN: CRP: 1.4 mg/dL — ABNORMAL HIGH (ref <1.0)

## 2020-12-28 MED ORDER — ALTEPLASE 2 MG IJ SOLR
2.0000 mg | Freq: Once | INTRAMUSCULAR | Status: AC
  Administered 2020-12-28: 2 mg

## 2020-12-28 MED ORDER — POTASSIUM CHLORIDE CRYS ER 20 MEQ PO TBCR
40.0000 meq | EXTENDED_RELEASE_TABLET | Freq: Once | ORAL | Status: AC
  Administered 2020-12-28: 40 meq via ORAL
  Filled 2020-12-28: qty 2

## 2020-12-28 NOTE — Progress Notes (Signed)
Occupational Therapy Session Note  Patient Details  Name: Daniel Cline MRN: [id number] Date of Birth: 11/16/1955  Today's Date: 12/28/2020 OT Individual Time: 0830-0913 OT Individual Time Calculation (min): 43 min    Short Term Goals: Week 2:  OT Short Term Goal 1 (Week 2): Pt will improve R hand grip strength to 20# to perform with self feeding/grooming with Supervision OT Short Term Goal 1 - Progress (Week 2): Progressing toward goal OT Short Term Goal 2 (Week 2): Pt will transition supine > sit w/ Max of 1 in prep for ADL OT Short Term Goal 2 - Progress (Week 2): Met OT Short Term Goal 3 (Week 2): Pt will roll L/R with Max A of 1 to assist with bed level ADL OT Short Term Goal 3 - Progress (Week 2): Met OT Short Term Goal 4 (Week 2): Pt will tolerate HOB >50* elevation for 5+ minutes during IADL/ADL OT Short Term Goal 4 - Progress (Week 2): Progressing toward goal OT Short Term Goal 5 (Week 2): Pt will thread pants bed level with reacher Mod A OT Short Term Goal 5 - Progress (Week 2): Progressing toward goal Week 3:  OT Short Term Goal 1 (Week 3): STGs = LTGs d/t ELOS OT Short Term Goal 1 - Progress (Week 3): Progressing toward goal Week 4:  OT Short Term Goal 1 (Week 4): STGs = LTGs d/t ELOS   Skilled Therapeutic Interventions/Progress Updates:    Pt greeted at time of session supine in bed on bed pan from nursing staff, daughter in law in room and remained throughout session. Repositioned bed level with HOB elevated to improve positioning to void, but unable. Focus of session on UB bathing with Min/Mod A with improved ability to use LUE and RUE, using R to assist with L to reach max ROM for functional use. UB dress Max A bed level with hemitechnique dressing LUE first, LB dressing shorts attempting to have pt thread semireclined bringing feet up to self but unable, assist to thread and rolling L/R to don over hips Max A. Pt/family had several questions about DC which were  answered, call bell in reach alarm on.   Therapy Documentation Precautions:  Precautions Precautions: Cervical,Fall Precaution Booklet Issued: No Required Braces or Orthoses: Cervical Brace Cervical Brace: Soft collar,For comfort Restrictions Weight Bearing Restrictions: No       Therapy/Group: Individual Therapy  Hannah C Spach 12/28/2020, 7:17 AM  

## 2020-12-28 NOTE — Progress Notes (Signed)
Physical Therapy Session Note  Patient Details  Name: Daniel Cline MRN: [id number] Date of Birth: 1956-06-16  Today's Date: 12/28/2020 PT Individual Time: 1430-1500 PT Individual Time Calculation (min): 30 min   Short Term Goals: Week 1:  PT Short Term Goal 1 (Week 1): Pt will tolerate sitting in appropriate wc x 1 hr to allow for participation in therapy session PT Short Term Goal 1 - Progress (Week 1): Met PT Short Term Goal 2 (Week 1): Pt will roll L/R w/mod assist and cues PT Short Term Goal 2 - Progress (Week 1): Met PT Short Term Goal 3 (Week 1): Pt will tolerate sitting on edge of bed/mat x 10 min w/occasional repositioning if needed and mod assist PT Short Term Goal 3 - Progress (Week 1): Not met Week 2:  PT Short Term Goal 1 (Week 2): pt to demonstrate upright sitting position at least ~75 degrees to increase safety with transfers and sitting tolerance PT Short Term Goal 1 - Progress (Week 2): Not met PT Short Term Goal 2 (Week 2): pt to demonstrate supine>sit mod A x1 PT Short Term Goal 2 - Progress (Week 2): Not met PT Short Term Goal 3 (Week 2): Pt will tolerate sitting on edge of bed/mat x 10 min w/occasional repositioning if needed and mod assist PT Short Term Goal 3 - Progress (Week 2): Not met Week 3:  PT Short Term Goal 1 (Week 3): pt to demonstrate upright sitting position at least ~75 degrees to increase safety with transfers and sitting tolerance PT Short Term Goal 1 - Progress (Week 3): Met PT Short Term Goal 2 (Week 3): pt to demonstrate supine>sit mod A x1 PT Short Term Goal 2 - Progress (Week 3): Not met PT Short Term Goal 3 (Week 3): Pt will tolerate sitting on edge of bed/mat x 10 min w/occasional repositioning if needed and mod assist PT Short Term Goal 3 - Progress (Week 3): Revised due to lack of progress Week 4:  PT Short Term Goal 1 (Week 4): pt to demonstrate supine>sit mod A x1 PT Short Term Goal 2 (Week 4): pt to demonstrate functional transfer at  max A x1 for safety with DC home PT Short Term Goal 3 (Week 4): Pt will tolerate sitting on edge of bed/mat x 5 min w/occasional repositioning if needed and mod assist  Skilled Therapeutic Interventions/Progress Updates:    Pain: Pt c/o neck pain in sitting after 5-6 min, strongly pushes himself back to lying for relief.  Pt initially sleeping, slow to arouse, appears sedated.  Pt rolls to L w/tactile cues to initiate, min assist to complete. L side to sit w/tactile cues to initiate, mod assist to complete.  Pt initially leans/pushes posteriorly.  Worked on gradual forward flexion to upright via advancing hand placment.  Pt tolerated approx 6 min sitting on edge of bed.   Attempted Sit to stand from bed but pt became very anxious, shorr quick breaths, shouts "I can't, I can't"  "it hurts"  "I am scared"  "I am going to vomit"  pushes himself strongly against forward momentum, pushes himself into lying on bed.   Pt lies w/eyes closed for several min.  Repeated rolling L and side to sit w/heavy mod assist.  Pt again leans posteriorly, refuses sitting, returns supine via pushing as above.  Scoots in supine laterally w/tactile and verbal cues for initiation/progression.  Bed placed in trendelenberg and pt gradually scoots to head of bed w/max cues for sequencing,  hand placement.  Rolls to R sidelying w/rail only.  Pt declined further rx, states he is nauseated, quickly falls asleep.   Therapy Documentation Precautions:  Precautions Precautions: Cervical,Fall Precaution Booklet Issued: No Required Braces or Orthoses: Cervical Brace Cervical Brace: Soft collar,For comfort Restrictions Weight Bearing Restrictions: No    Therapy/Group: Individual Therapy  Callie Fielding, [place] 12/28/2020, 3:16 PM

## 2020-12-28 NOTE — Progress Notes (Signed)
PROGRESS NOTE   Subjective/Complaints:  Pt reports just woke up- no specific complaints, but does note, he likes the lidoderm patches for neck- a lot.  Also needs to ask for pain meds.  Drinking supplements per pt.  K+ 3.3- will replete.   ROS:   Pt denies SOB, abd pain, CP, N/V/C/D, and vision changes   Objective:   No results found. Recent Labs    12/28/20 0629  WBC 10.4  HGB 11.0*  HCT 35.3*  PLT 331   Recent Labs    12/28/20 0629  NA 134*  K 3.3*  CL 97*  CO2 27  GLUCOSE 96  BUN 16  CREATININE 0.82  CALCIUM 9.1    Intake/Output Summary (Last 24 hours) at 12/28/2020 0842 Last data filed at 12/28/2020 0531 Gross per 24 hour  Intake 460 ml  Output 850 ml  Net -390 ml        Physical Exam: Vital Signs Blood pressure 110/63, pulse 75, temperature 98 F (36.7 C), temperature source Oral, resp. rate 18, height '5\' 7"'  (1.702 m), weight 75.7 kg, SpO2 95 %.    General: asleep, HOH; woke to verbal stimuli; supine in bed;  NAD HENT: conjugate gaze; oropharynx moist; lidoderm patches on neck in correct spot- removed them CV: regular rate; no JVD Pulmonary: CTA B/L; no W/R/R- good air movement GI: soft, NT, ND, (+)BS- hypoactive Psychiatric: more interactive, in spite of being sleepy Neurological: more alert  Musculoskeletal:     Comments: LUE-strength in his Left arm is 4-/5-in the deltoid, bicep, tricep, wrist extension, grip, and finger abduction RUE-deltoid bicep and tricep are 3/5, wrist extension and grip are 3-/5, LLE-4/5 in hip flexion, knee extension, and dorsiflexion as well as plantarflexion RLE-strength is 4-/5-in the same muscles TTP over L shoulder and L upper trap areas Skin:    General: Skin is warm and dry.     Comments:  PICC line in his left upper extremity looks good  Neurological:  He reports his light touch is intact in all 4 extremities He has no lower extremity clonus  bilaterally  Assessment/Plan: 1. Functional deficits which require 3+ hours per day of interdisciplinary therapy in a comprehensive inpatient rehab setting.  Physiatrist is providing close team supervision and 24 hour management of active medical problems listed below.  Physiatrist and rehab team continue to assess barriers to discharge/monitor patient progress toward functional and medical goals  Care Tool:  Bathing  Bathing activity did not occur: Safety/medical concerns (Per OT eval on medical hold) Body parts bathed by patient: Chest,Abdomen,Front perineal area,Right upper leg,Left upper leg,Face,Left arm   Body parts bathed by helper: Right arm,Buttocks,Right lower leg,Left lower leg     Bathing assist Assist Level: Maximal Assistance - Patient 24 - 49%     Upper Body Dressing/Undressing Upper body dressing   What is the patient wearing?: Pull over shirt    Upper body assist Assist Level: Maximal Assistance - Patient 25 - 49%    Lower Body Dressing/Undressing Lower body dressing      What is the patient wearing?: Pants     Lower body assist Assist for lower body dressing: Moderate Assistance - Patient  56 - 74%     Toileting Toileting    Toileting assist Assist for toileting: Dependent - Patient 0% (per staff  report, declined at eval. also limited by pain) Assistive Device Comment: foley   Transfers Chair/bed transfer  Transfers assist  Chair/bed transfer activity did not occur: Refused  Chair/bed transfer assist level: Dependent - Patient 0% (lateral slide)     Locomotion Ambulation   Ambulation assist   Ambulation activity did not occur: Safety/medical concerns          Walk 10 feet activity   Assist  Walk 10 feet activity did not occur: Safety/medical concerns        Walk 50 feet activity   Assist Walk 50 feet with 2 turns activity did not occur: Safety/medical concerns         Walk 150 feet activity   Assist Walk 150 feet  activity did not occur: Safety/medical concerns         Walk 10 feet on uneven surface  activity   Assist Walk 10 feet on uneven surfaces activity did not occur: Safety/medical concerns         Wheelchair     Assist Will patient use wheelchair at discharge?: Yes Type of Wheelchair: Manual Wheelchair activity did not occur: Refused  Wheelchair assist level: Dependent - Patient 0%      Wheelchair 50 feet with 2 turns activity    Assist    Wheelchair 50 feet with 2 turns activity did not occur: Refused   Assist Level: Dependent - Patient 0%   Wheelchair 150 feet activity     Assist  Wheelchair 150 feet activity did not occur: Refused   Assist Level: Dependent - Patient 0%   Blood pressure 110/63, pulse 75, temperature 98 F (36.7 C), temperature source Oral, resp. rate 18, height '5\' 7"'  (1.702 m), weight 75.7 kg, SpO2 95 %.  Medical Problem List and Plan: 1.  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  Acute DVT in peroneal/soleus: eliquis restarted 3. Pain Management: Patient is already receiving oxycodone as needed; we started tramadol scheduled but that is not working; will DC tramadol and schedule MS Contin 15 mg twice daily  - on Valium and Firoicet prn per NSU  D/c cymbalta which may be causing emesis.   Continue MS Contin as pain and ability to participate in therapy limited by pain. XR cervical spine stable, soft collar ordered.   4/27- pain sounds like down to 5/10 after d/w pt what he means- it's not 8/10 based on description, con't regimen  4/29- pt working better with therapy- slowly- and pain is "slightly better"- so guessing around 4/10 at rest  5/1: requested kpad be given for his upper back pain,  continue  5/2- like lidoderm and kpad- cannot use together.  4. Mood: - Messaged SW regarding adding Mr. Veldhuizen to Neuropsychologyschedule due to uncontrolled pain and new SCI.   4/27- will add Buspar 5 mg TID and change as required  5/2- less anxious- doing better with Buspar- con't regimen             -antipsychotic  agents: N/A 5. Neuropsych: This patient is capable of making decisions on his own behalf. 6. Skin/Wound Care: We will change his honeycomb dressing on his neck to daily and as needed; will also change the dressing on his right forearm to just a dry dressing and change as needed. Less drainage today, will not require I&D  4/23 minimal drainage, almost healed incision- continue follow/monitor 7. Fluids/Electrolytes/Nutrition: We will check labs on Monday 8. Uncontrolled Diabetes II- A1c of 13.9-continue patient's Levemir 10 units nightly and sliding scale insulin  4/23- CBGs bordereline. increase Levemir to 18 units nightly.    5/1- BGs 118-170- continue regimen 9. Hypertension hx with Orthostatic hypotension-patient is on HCTZ 25 mg daily as well as Norvasc 10 mg daily and lisinopril 40 mg daily and Lopressor 50 mg twice daily-blood pressure is well controlled we will continue these medications and monitor especially with increased activity  4/15: Have reduced Lisinopril, HCTZ, Norvasc and con't Lopressor- still having episode of orthostatic hypotension- might need additional reduction- don't decreased B Blocker due to tachycardia frequently  4/29- BP controlled- no Orthostasis- con't regimen  4/30: SBP soft to 70s, decrease norvasc to 2.36m Vitals:   12/27/20 1949 12/28/20 0527  BP: 104/60 110/63  Pulse: 76 75  Resp: 18 18  Temp: 98.4 F (36.9 C) 98 F (36.7 C)  SpO2: 96% 95%    10.  Neurogenic bowel-we will look to start him on a bowel program nightly after dinner with Dulcolax suppository  4/15- KUB showed diffuse constipation- will give Sorbitol 60 ml to clean him  out  4/25- full of stool AGAIN- even though on bowel program and on bowel meds- not sure if refusing?- Has diffuse ileus and severe constipation-will give Sorbitol again 60 ml= put on liquid diet- and increase bowel meds  4/26- pt thought was "OK" to refuse bowel meds and bowel program- explained is so ful of stool, needs 10+ BMs to clean out- ordered Mg citrate- and f/u KUB  4/27- KUB not much better- but will con't liquid diet- doesn't want NG tube- to finish Mg citrate today- d/w pt and family.   4/28- will recheck KUB today and also ordered Mg citrate again and Fleets enema at 3pm- pt said IF KUB worse, willing to do NGT, but only if worse  4/29- moderate stool on new KUB- ileus resolved- and had 3 BMs since KUB done- will switch back to regular diet and con't bowel program 12.  Acute DVT in the soleus and peroneal-we will change his Lovenox to apixaban/Eliquis 10 mg BID x 1 week, then 5 mg BID.  13.  Neurogenic bladder-we will continue his Foley for now and start Flomax tomorrow 0.4 mg q. supper and see if he is able to start to void;  we might need to titrate this as required- will try and remove foley next week.   4/27- will start Chronic foley- since pt doesn't want family cathing him.   14. Hypokalemia: supplement 440mBID today and repeat BMP tomorrow  4/8- K+ 3.5- borderline low- will recheck Monday  4/11- K+ 3.3 will replete 40 mEq x2 and recheck Thursday   5/2- K+ down to 3.3- had been holding his own- will replete today 40 mEq x1 15. Epidural abscess-  4/5- on  Cefazolin IV for prolonged IV ABX- pt's WBC down to 15.5 (was 14k yesterday, but 20.8 3 days ago)- low grade temps up to 100.2 in last 24 hours-will con't regimen   4/6-White count is down slightly  to 14K-we will continue IV antibiotics and check CRP and ESR-weekly  4/7- afebrile- will recheck labs in AM  4/8- WBC stable- con't IV ABX.   4/11- WBC down to 11.3- ESR >140 and CRP 10.9- heading a little better- con't to monitor.    4/15 WBC 9.8, monitor weekly  5/2- WBC 10.4-  16. Dysphagia?  D2 diet due to lack of teeth, not due to dysphagia- doesn't need pills crushed. 17. Vomiting: d/c cymbalta which may be contributing. IV zofran. KUB ordered and shows no abdominal pathology.   -4/23 improved off iron, reglan dc'ed  4/25- still nauseated- due to ileus  4/26- on liquid diet- con't regimen  4/27- ileus not much better- conn't liquid diet/Mg citrate-   4/28- as above- only NGT if KUB worse- pt agreed to this compromise- added another dose mg citrate and fleets enema.    4/29- ileus resolved 18. Atelectasis: Recommend incentive spirometer to improve breathing strength and ability over time. Provided incentive spirometer education. 19. Lethargy: decreased valium to 64m BID.    LOS: 30 days A FACE TO FACE EVALUATION WAS PERFORMED  Evva Din 12/28/2020, 8:42 AM

## 2020-12-28 NOTE — Progress Notes (Signed)
Patient ID: Daniel Cline, male   DOB: Sep 29, 1955, 65 y.o.   MRN: [id number] met with heather-daughter in-law when here to give her the information regarding equipment and she voiced concern over him going home on morphine and wanting him to be weaned off of it prior to 5/6. Will sent a text to MD to voice their concern. Have found a home health agency to provide PT & OT follow up at discharge. Will need to get equipment prior to discharge Friday.

## 2020-12-29 ENCOUNTER — Inpatient Hospital Stay (HOSPITAL_COMMUNITY): Payer: Medicare Other

## 2020-12-29 DIAGNOSIS — E43 Unspecified severe protein-calorie malnutrition: Secondary | ICD-10-CM

## 2020-12-29 DIAGNOSIS — I10 Essential (primary) hypertension: Secondary | ICD-10-CM

## 2020-12-29 DIAGNOSIS — E11649 Type 2 diabetes mellitus with hypoglycemia without coma: Secondary | ICD-10-CM

## 2020-12-29 LAB — GLUCOSE, CAPILLARY
Glucose-Capillary: 129 mg/dL — ABNORMAL HIGH (ref 70–99)
Glucose-Capillary: 120 mg/dL — ABNORMAL HIGH (ref 70–99)
Glucose-Capillary: 111 mg/dL — ABNORMAL HIGH (ref 70–99)
Glucose-Capillary: 128 mg/dL — ABNORMAL HIGH (ref 70–99)
Glucose-Capillary: 134 mg/dL — ABNORMAL HIGH (ref 70–99)

## 2020-12-29 IMAGING — CR DG ABDOMEN 1V
1 series · 1 of 1 positions shown · non-contrast
Comparison: [DATE].

CLINICAL DATA: Multiple vomiting episodes.

EXAM:
ABDOMEN - 1 VIEW

[abdomen kub]
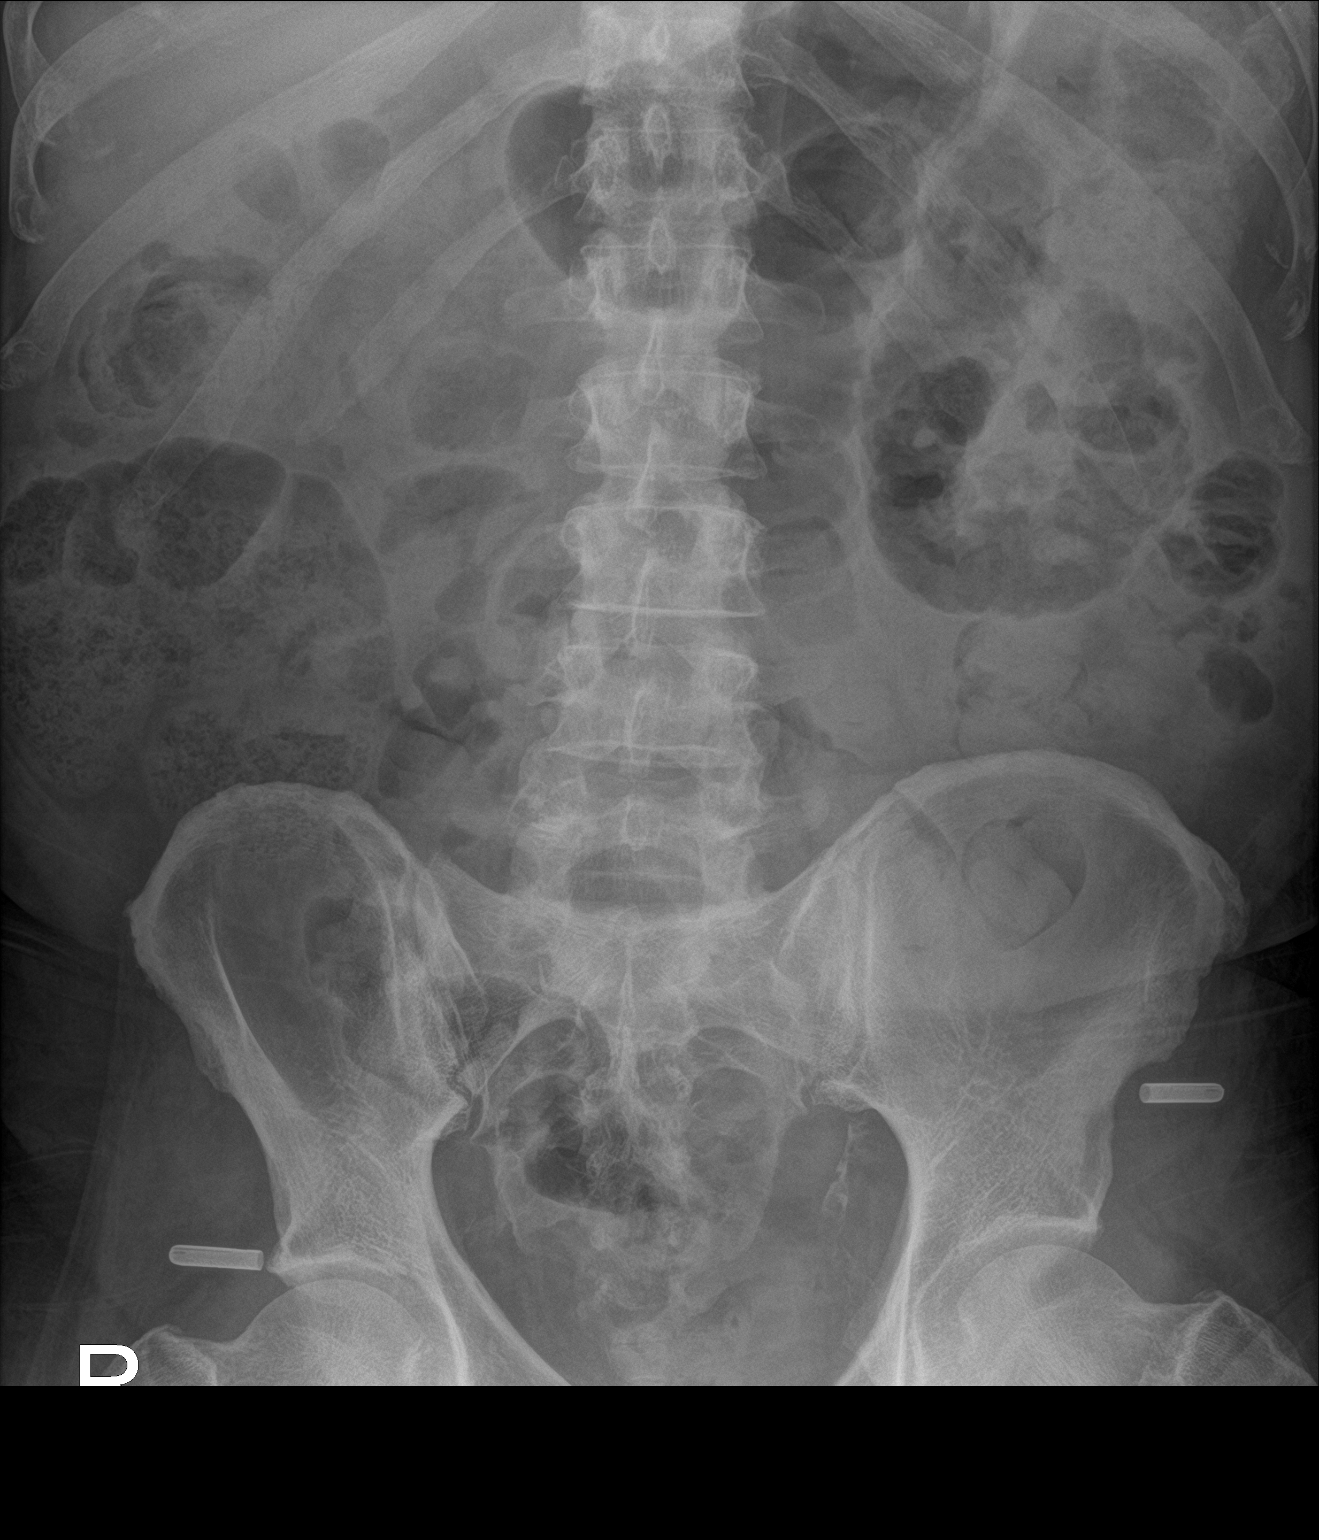

[1 of 1 positions shown; findings below may reference images not displayed]

FINDINGS: Soft tissue structures are unremarkable. Several distended loops of
small bowel noted. Stool noted throughout the colon. Colon is
nondistended. No free air. Degenerative change lumbar spine.
Radiopacities noted both buttocks. Vascular calcification of the
pelvis.
IMPRESSION: Several distended loops of small bowel noted. Stool noted throughout
colon. Colon is nondistended. Although adynamic ileus could present
in this fashion, follow-up abdominal series suggested to exclude
developing small bowel obstruction.

## 2020-12-29 IMAGING — CR DG ABDOMEN ACUTE W/ 1V CHEST
3 series · 3 of 3 positions shown · non-contrast
Comparison: [DATE] at [DATE] a.m.

CLINICAL DATA: Nausea

EXAM:
DG ABDOMEN ACUTE WITH 1 VIEW CHEST

[abdomen erect]
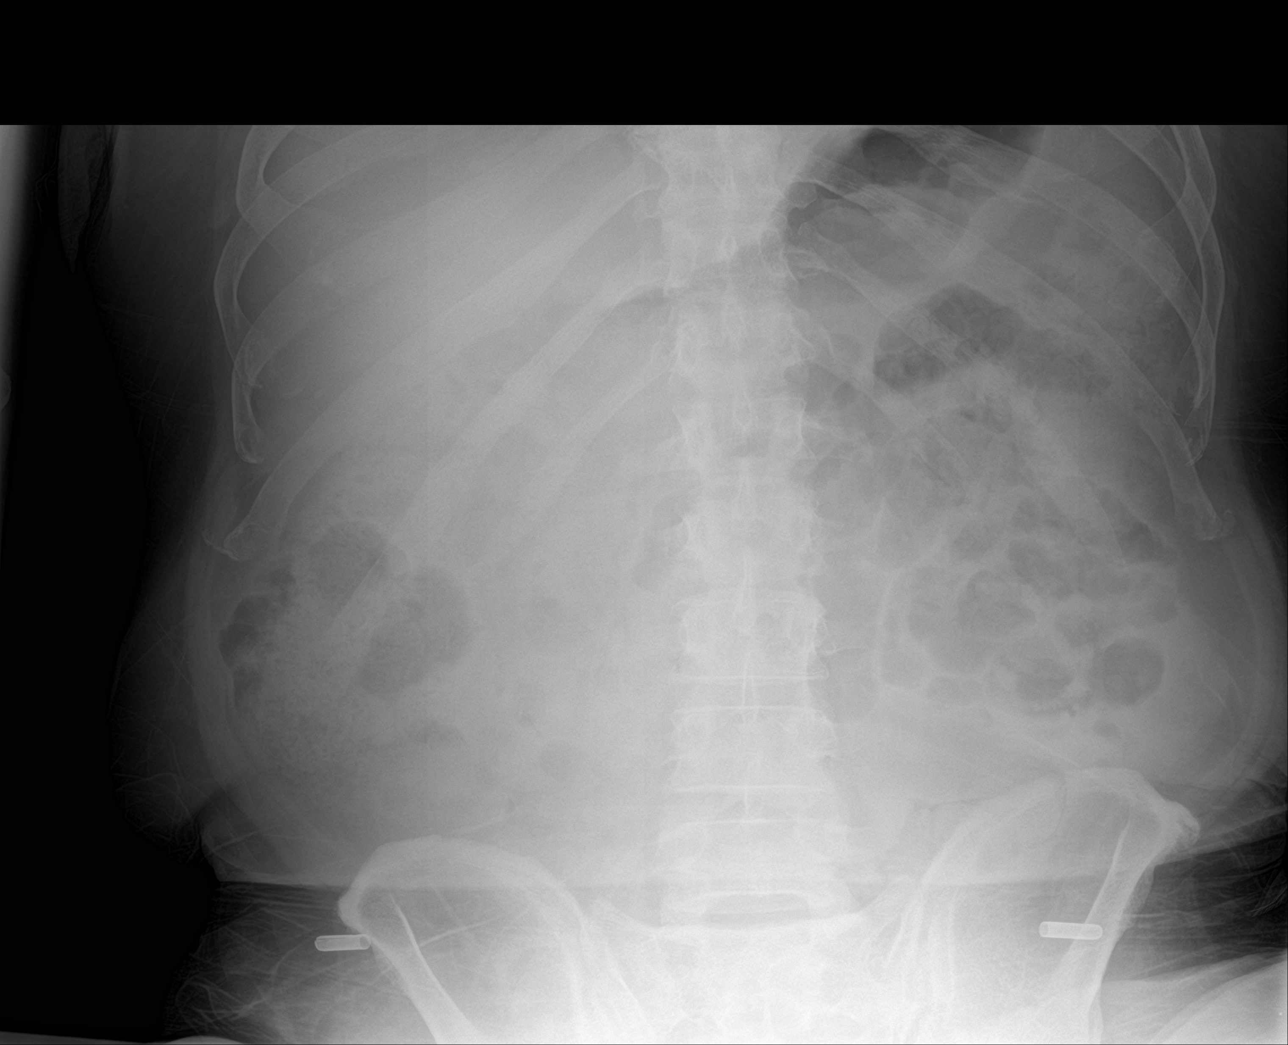

[abdomen supine]
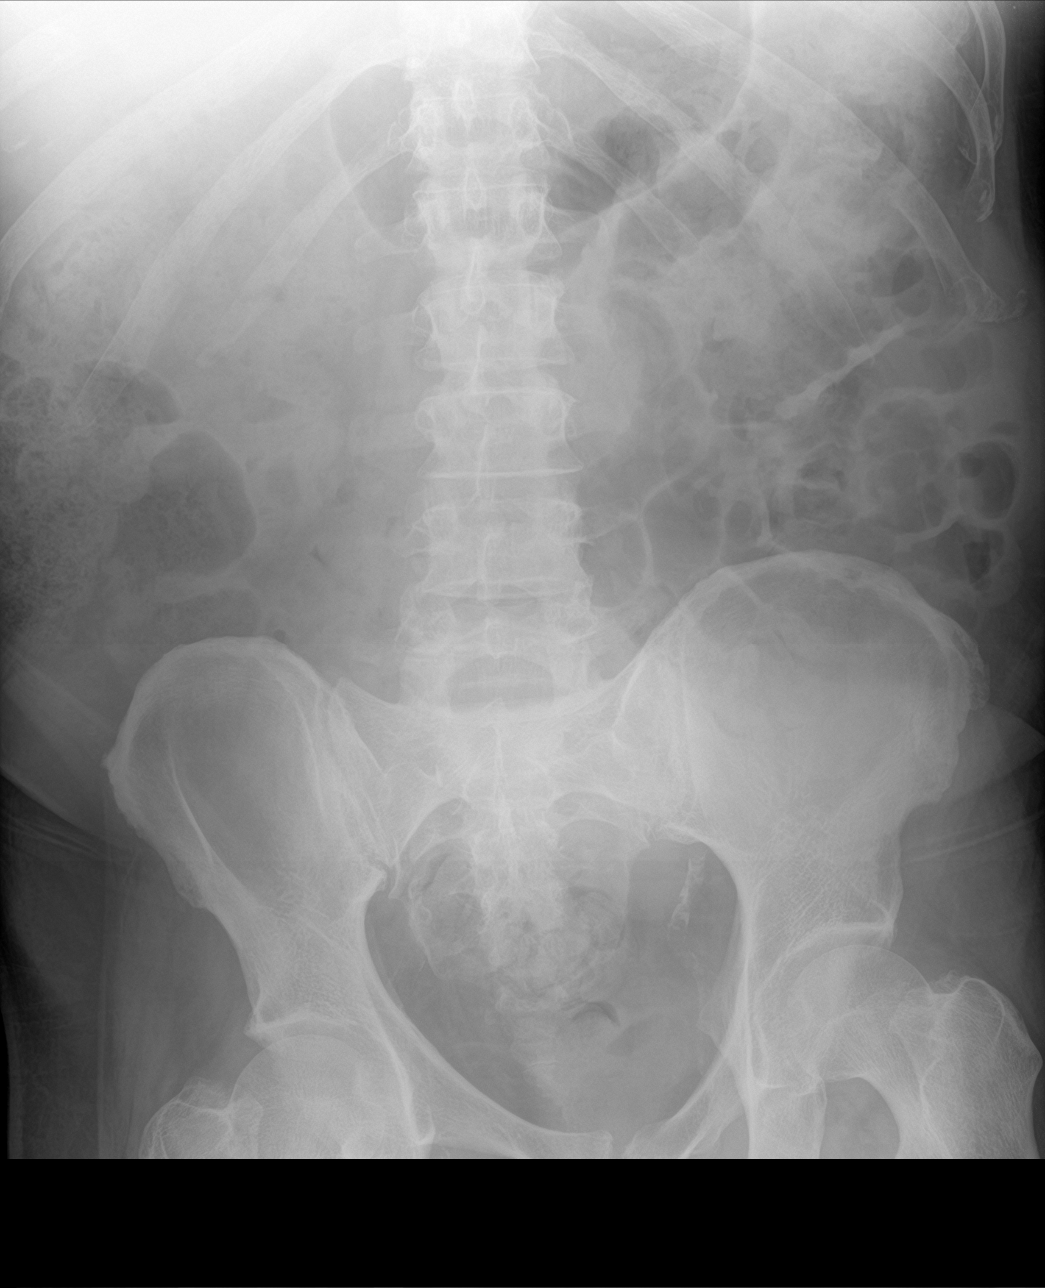

[chest ap]
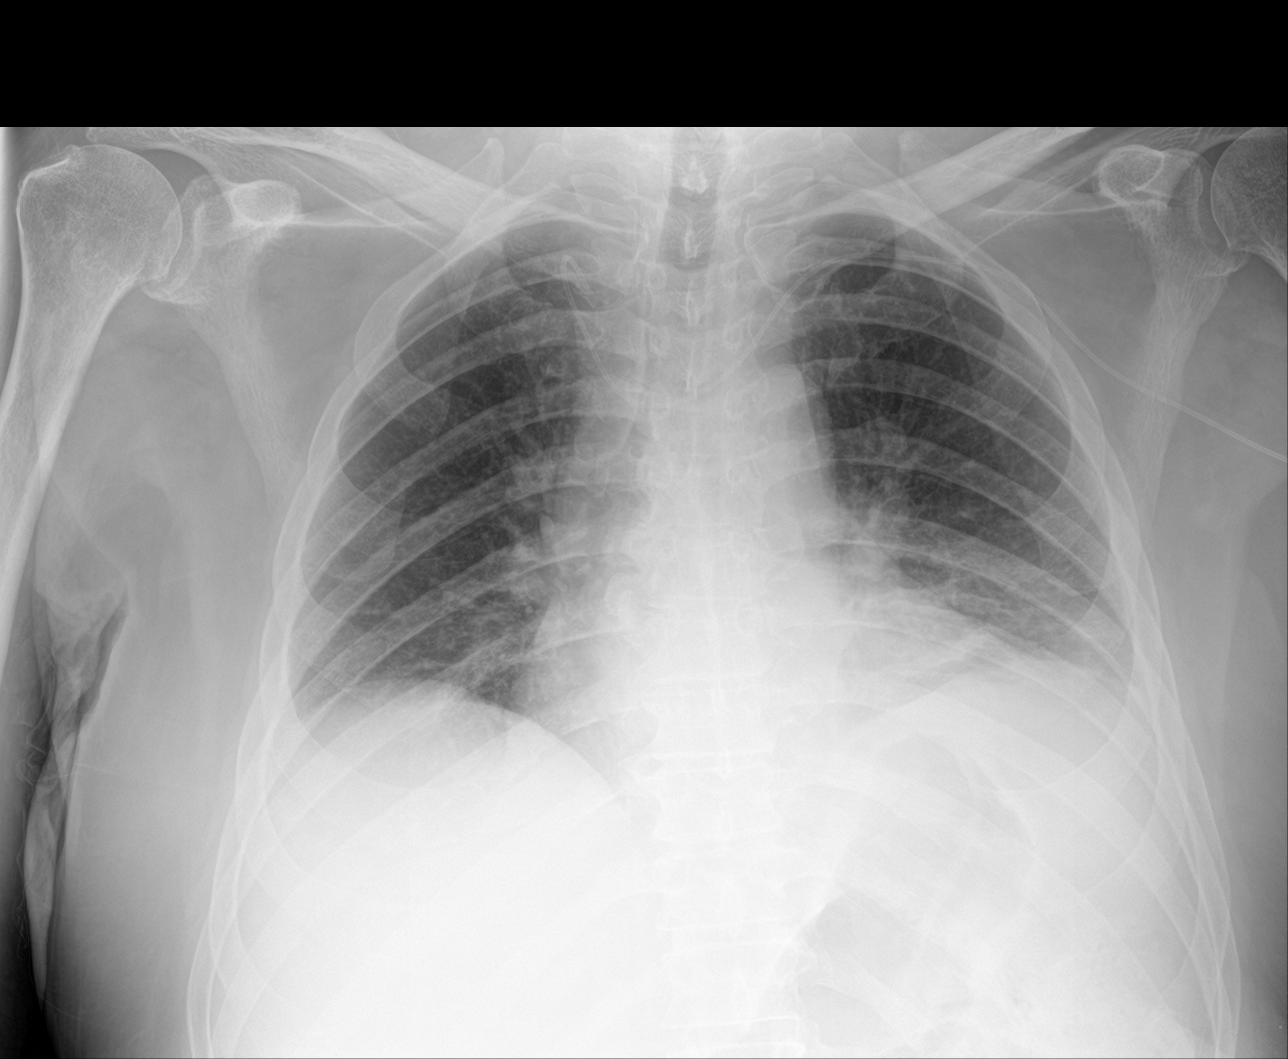

[3 of 3 positions shown; findings below may reference images not displayed]

FINDINGS: Supine and upright frontal views of the abdomen as well as an
upright frontal view of the chest are obtained. Cardiac silhouette
is unremarkable. Left-sided PICC line is again noted, tip projecting
over the right brachiocephalic confluence. Please correlate with
catheter function. Lung volumes are diminished, with hypoventilatory
changes at the lung bases. No effusion or pneumothorax.

The distended gas-filled loops of small bowel within the left mid
abdomen on previous study of decreasing caliber on this exam. Gas
and stool are seen throughout the colon to the level of the rectum.
No evidence of high-grade obstruction. No masses or abnormal
calcifications. No free gas in the greater peritoneal sac.
IMPRESSION: 1. Improving gaseous distention of the small bowel in the left upper
quadrant, consistent with resolving ileus.
2. Interval change in position of the left-sided PICC, catheter tip
now projecting over the brachiocephalic confluence on the right.
Please correlate with catheter function.
3. Hypoventilatory changes at the lung bases.

## 2020-12-29 IMAGING — MR MR CERVICAL SPINE W/O CM
5 series · 38 of 48 positions shown · non-contrast
Comparison: [DATE]

CLINICAL DATA: Postsurgical follow-up after laminectomy.

EXAM:
MRI CERVICAL SPINE WITHOUT CONTRAST
TECHNIQUE: Multiplanar, multisequence MR imaging of the cervical spine was
performed. No intravenous contrast was administered.

[Series 5: T2 · sagittal · 3.0mm · 0.69mm/px · 6 of 15 slices shown (1 of 2)]
[im 1/15]
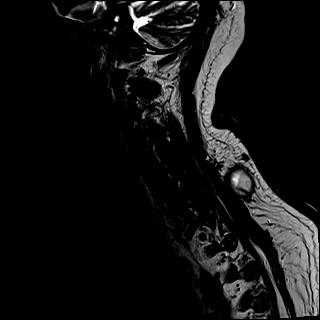
[im 3/15]
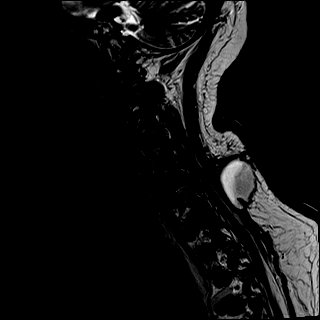
[im 6/15]
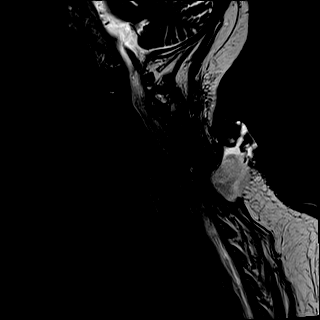
[im 9/15]
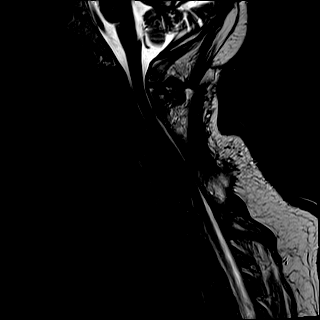
[im 12/15]
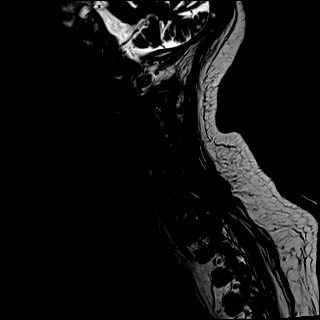
[im 15/15]
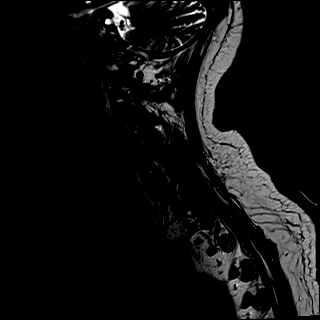

[Series 6: T1 · sagittal · 3.0mm · 0.69mm/px · 6 of 15 slices shown]
[im 1/15]
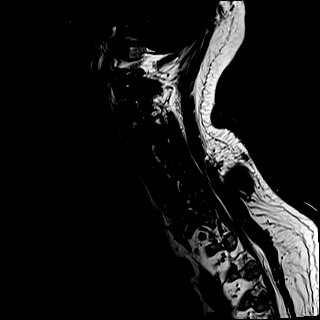
[im 3/15]
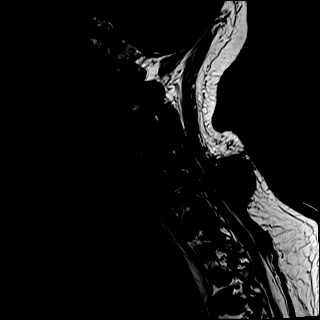
[im 6/15]
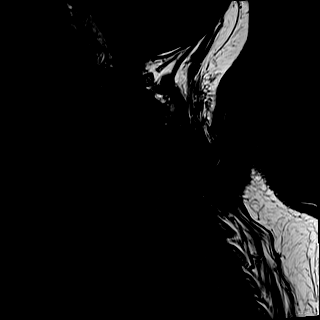
[im 9/15]
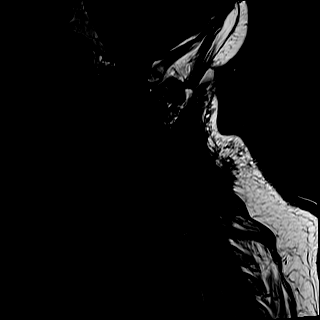
[im 12/15]
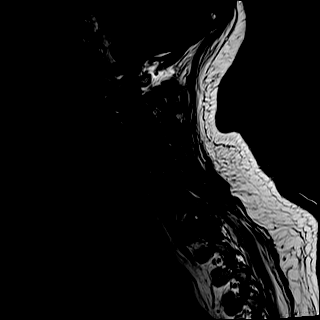
[im 15/15]
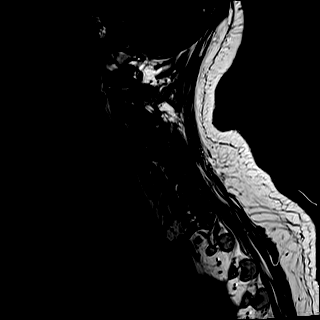

[Series 7: STIR · sagittal · 3.0mm · 0.86mm/px · 6 of 15 slices shown]
[im 1/15]
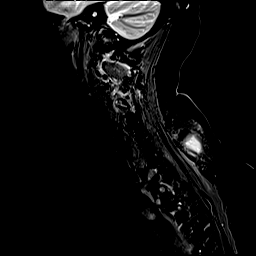
[im 3/15]
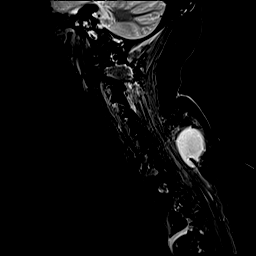
[im 6/15]
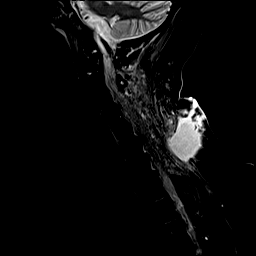
[im 9/15]
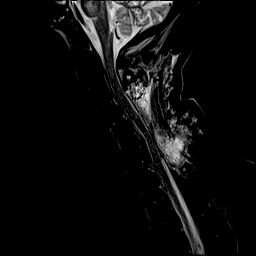
[im 12/15]
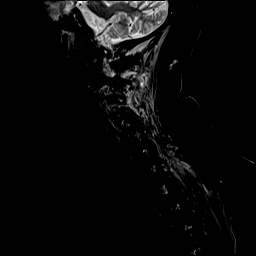
[im 15/15]
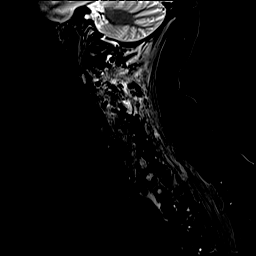

[Series 8: T2 · axial · 3.0mm · 0.66mm/px · z∈[-181,-71]mm · 12 of 40 slices shown (2 of 2)]
[im 1/40]
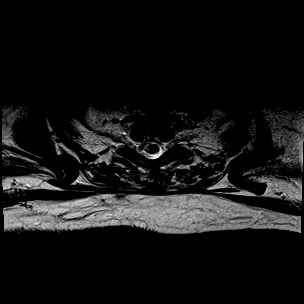
[im 3/40]
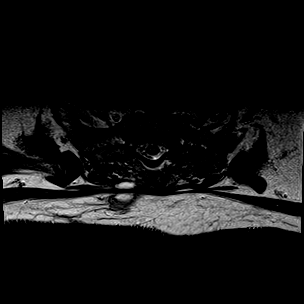
[im 6/40]
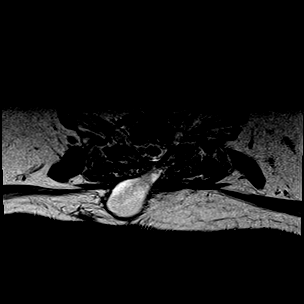
[im 9/40]
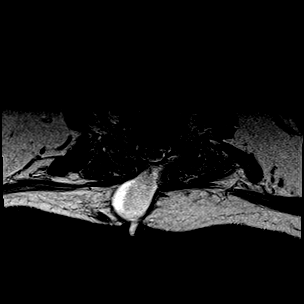
[im 12/40]
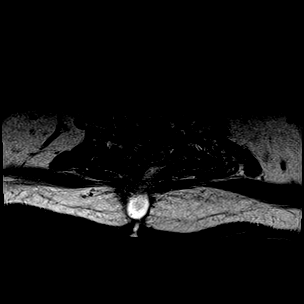
[im 14/40]
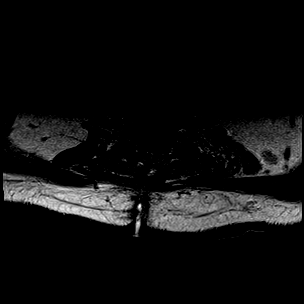
[im 17/40]
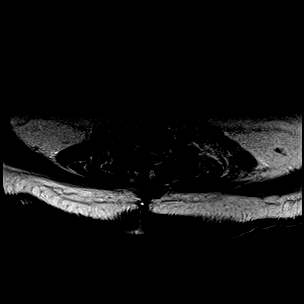
[im 20/40]
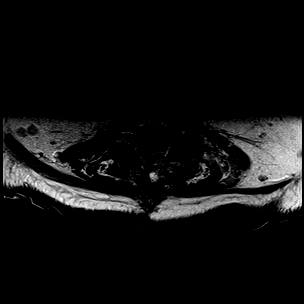
[im 23/40]
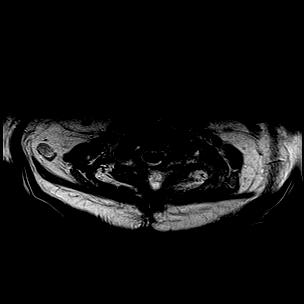
[im 28/40]
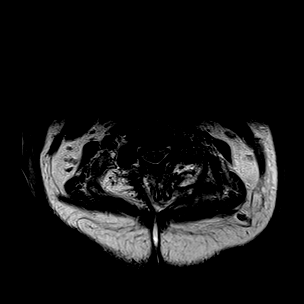
[im 34/40]
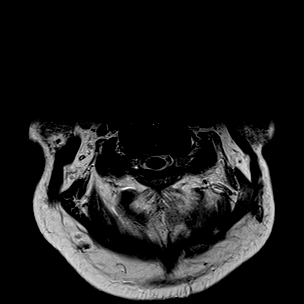
[im 40/40]
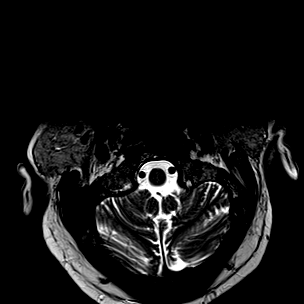

[Series 10: GRE · axial · 3.0mm · 0.39mm/px · z∈[-181,-71]mm · 8 of 40 slices shown]
[im 1/40]
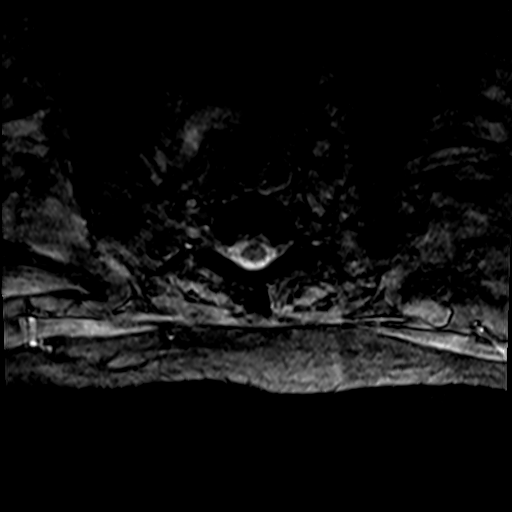
[im 6/40]
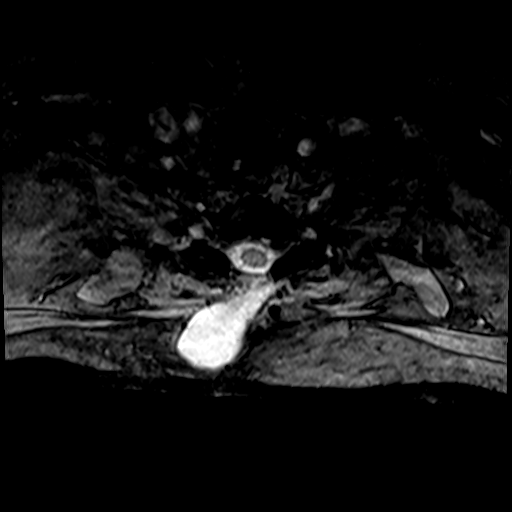
[im 12/40]
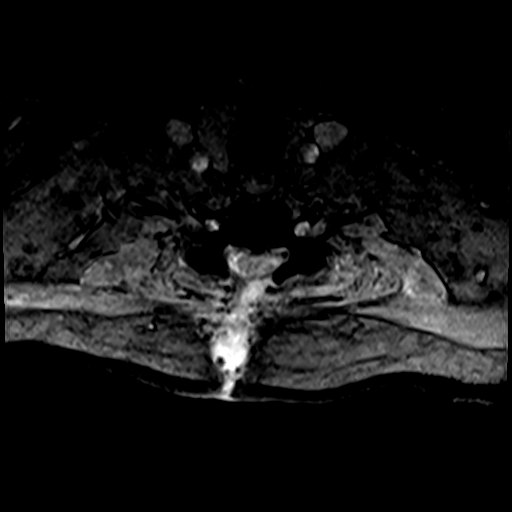
[im 17/40]
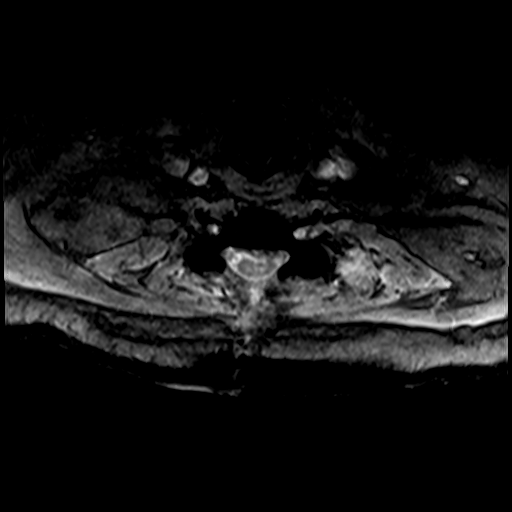
[im 23/40]
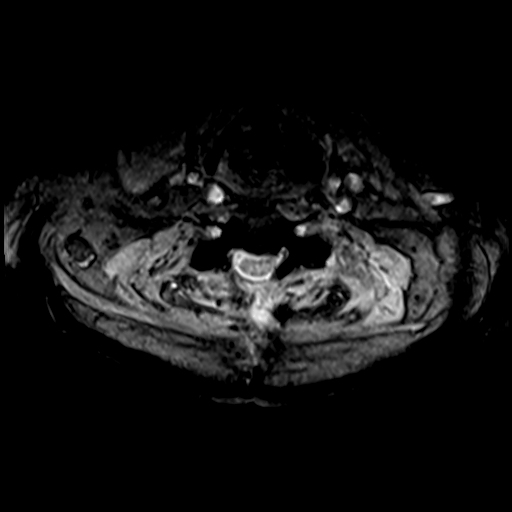
[im 28/40]
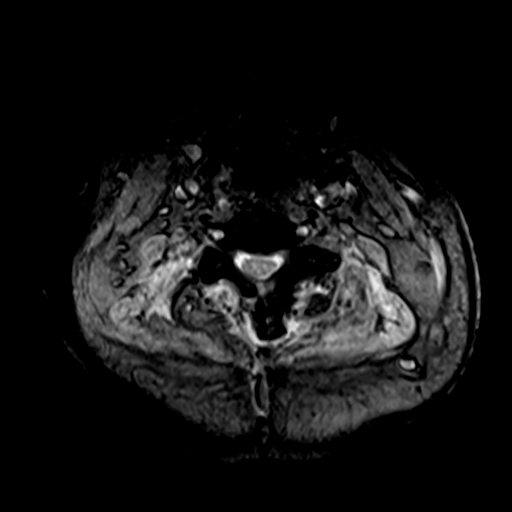
[im 34/40]
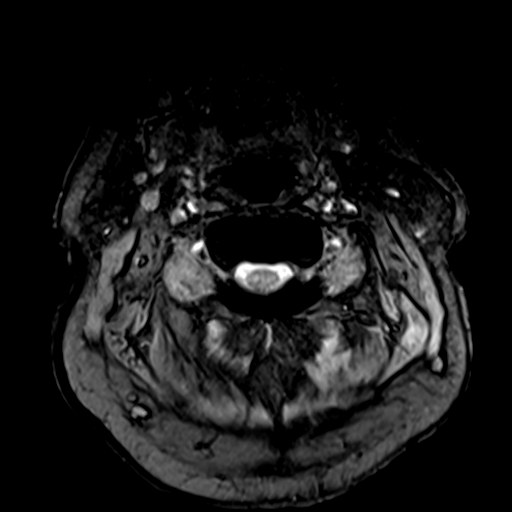
[im 40/40]
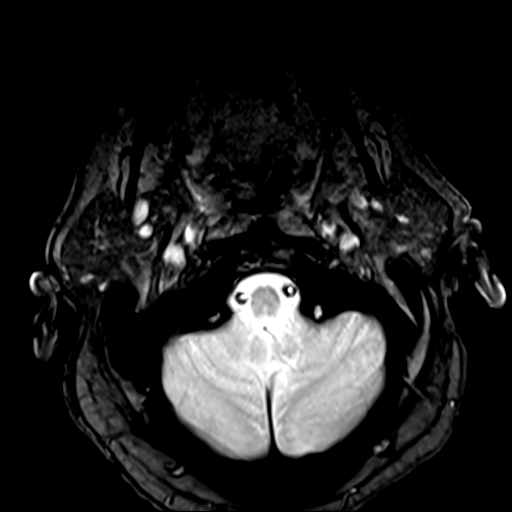

[38 of 48 positions shown; findings below may reference images not displayed]

FINDINGS: Alignment: Reversal of normal cervical lordosis may be positional or
due to muscle spasm.

Vertebrae: Posterior decompression with bilateral laminectomies at
C3-7

Cord: Normal signal and morphology.

Posterior Fossa, vertebral arteries, paraspinal tissues: Fluid
collection in the posterior soft tissues at the C5-7 levels measures
4.0 x 2.4 x 3.1 cm.

Disc levels:

C2-3: Improved patency of the spinal canal.

C3-4: Posterior decompression with improved patency of the spinal
canal.

C4-5: Posterior decompression. Unchanged left subarticular disc
osteophyte complex indenting the ventral spinal cord. Spinal canal
patency is improved.

C5-6: Left subarticular disc osteophyte complex contacting the
ventral spinal cord. Posterior decompression.

C6-7: Posterior decompression.  No spinal canal stenosis.
IMPRESSION: 1. Posterior decompression from C3-7 with improved patency of the
spinal canal at multiple levels.
2. Unchanged ventral spinal cord contact by left subarticular
disc-osteophyte complexes at C4-6.
3. Fluid collection in the posterior soft tissues at the C5-7
levels, measuring up to 4.0 x 2.4 x 3.1 cm, likely seroma.

## 2020-12-29 MED ORDER — CEFADROXIL 500 MG PO CAPS
1000.0000 mg | ORAL_CAPSULE | Freq: Two times a day (BID) | ORAL | Status: DC
  Administered 2020-12-29 – 2021-01-01 (×6): 1000 mg via ORAL
  Filled 2020-12-29 (×6): qty 2

## 2020-12-29 MED ORDER — CEFADROXIL 1 G PO TABS: 1.0000 g | ORAL_TABLET | Freq: Two times a day (BID) | ORAL | Status: DC

## 2020-12-29 MED ORDER — MORPHINE SULFATE ER 15 MG PO TBCR
15.0000 mg | EXTENDED_RELEASE_TABLET | Freq: Every day | ORAL | Status: DC
  Administered 2020-12-30 – 2021-01-01 (×3): 15 mg via ORAL
  Filled 2020-12-29 (×3): qty 1

## 2020-12-29 NOTE — Patient Care Conference (Signed)
Inpatient RehabilitationTeam Conference and Plan of Care Update Date: 12/29/2020   Time: 11:35 AM    Patient Name: Daniel Cline      Medical Record Number: [id number]  Date of Birth: [birth year] Sex: Male         Room/Bed: 4W03C/4W03C-01 Payor Info: Payor: MEDICARE / Plan: MEDICARE PART A / Product Type: *No Product type* /    Admit Date/Time:  11/28/2020  1:22 PM  Primary Diagnosis:  Cervical myelopathy [facility])  Hospital Problems: Principal Problem:   Cervical myelopathy ([place]) Active Problems:   Epidural intraspinal abscess   Hypertension associated with diabetes ([place])   Uncontrolled type 2 diabetes mellitus with hyperglycemia ([place])   Neurogenic bowel   Neurogenic bladder   Protein-calorie malnutrition, severe   Pain    Expected Discharge Date: Expected Discharge Date: 01/01/21  Team Members Present: Physician leading conference: Dr. Courtney Heys Care Coodinator Present: Dorthula Nettles, RN, BSN, CRRN;Becky Dupree, LCSW Nurse Present: Dorthula Nettles, RN PT Present: Stacy Gardner, PT OT Present: Lillia Corporal, OT PPS Coordinator present : Gunnar Fusi, SLP     Current Status/Progress Goal Weekly Team Focus  Bowel/Bladder   Pt currently Incont of Bowel. Foley in place for rentention. LBM 12/27/20  Pt gains normal function of Bowel and bladder.  T[place]iet pt q2h and PRN   Swallow/Nutrition/ Hydration             ADL's   Mod/Max bed level bathing and dressing, functional use and ROM improving in BUEs, bed level toileting, supine <> sit 2 helpers with limited tolerance  Mod/Max  sitting balance/tolerance, EOB activity, sit <> stands if able, bed level ADL progression and retraining, family ed!!   Mobility   Max A 1-2 for supine>sit transfer ; dependent x3 supine lateral transfers with sheet; refused to attempt STS, min A x1 rolling, tolerated sitting EOB 1.5 min mod-max  mod A transfers; CGA bed mob; max A WC 20'      Communication             Safety/Cognition/ Behavioral  Observations            Pain   Pt states he has no pain hwile lying down. Pain is controlled by current regiment.  Pt states pain is managed or has no pain  Assess for pain qshift and offer PRN medications or hot/cold therapy   Skin   Current surigcal incsion on posterior neck. MASD to groin  Skin is clean dry and intact. Wounds heals with no s/s of infection or new breakdown.  Assess skin qshift and provide skin care or dressing changes as needed     Discharge Planning:  Family education on-going getting equipment and follow up. family wants pt weaned off morphne prior to Dc home   Team Discussion: MRI of neck before DC, x-ray of abdomen, possible small bowel obstruction, foley care. Decreased MS Contin, family wants to wean medication. Incontinent of bowel, he is on the bowel program, 4/10 pain. MASD, small scrab to incision. Most likely will need an NG tube. Patient on target to meet rehab goals: no, pretty much the same as last week. Sat up at the EOB +2 for 2-4 minutes. Pain, nausea limiting. Functional use of hands improving. Legs have good ROM. Had been getting up to EOB for 1-2 minutes. Refuses sit to stand. Have not done any formal transfers.  *See Care Plan and progress notes for long and short-term goals.   Revisions to Treatment Plan:  May need  an NG tube if he has a bowel obstruction.  Teaching Needs: Family education, medication management, pain management, bowel program education, constipation management, foley care education, skin/wound care, transfer training, bed mobility training, balance training, EOB sitting training, endurance training, safety awareness.  Current Barriers to Discharge: Decreased caregiver support, Medical stability, Home enviroment access/layout, IV antibiotics, Incontinence, Neurogenic bowel and bladder, Wound care, Lack of/limited family support, Insurance for SNF coverage, Weight, Weight bearing restrictions, Medication compliance and  Behavior  Possible Resolutions to Barriers: Continue current medications, provide emotional support.     Medical Summary Current Status: ID consulted about IV ABX/PO ABX; still has Foley; has bowel program-incontinent- MASD- on buttocks; neck incision on small scab- more pain this AM-  Barriers to Discharge: Behavior;Medication compliance;Decreased family/caregiver support;Home enviroment access/layout;Incontinence;Neurogenic Bowel & Bladder;IV antibiotics;Weight bearing restrictions;Wound care;Medical stability;Other (comments)  Barriers to Discharge Comments: KUB developing SBO; getting abd series; need to pay for all equipment- Possible Resolutions to Barriers/Weekly Focus: will likely need MRI neck, abd series; increased pain- willing to go to 1x/day MS Contin to try; ESR 82 and CRP 1.4- might need to do NGT depending on SBO- d/c scheduled for 5/6- if not, early next week. won't do SB or squat pivot for transfers- pt refused.   Continued Need for Acute Rehabilitation Level of Care: The patient requires daily medical management by a physician with specialized training in physical medicine and rehabilitation for the following reasons: Direction of a multidisciplinary physical rehabilitation program to maximize functional independence : Yes Medical management of patient stability for increased activity during participation in an intensive rehabilitation regime.: Yes Analysis of laboratory values and/or radiology reports with any subsequent need for medication adjustment and/or medical intervention. : Yes   I attest that I was present, lead the team conference, and concur with the assessment and plan of the team.   Cristi Loron 12/29/2020, 5:47 PM

## 2020-12-29 NOTE — Progress Notes (Signed)
Nutrition Follow-up  DOCUMENTATION CODES:   Severe malnutrition in context of acute illness/injury  INTERVENTION:   - Ensure Enlive po TID, each supplement provides 350 kcal and 20 grams of protein  - Continue MVI with minerals daily  - Provided diet education and "High Calorie, High Protein Nutrition Therapy" handout from the Academy of [employer] and Dietetics  - Family to bring in food that pt enjoys to promote PO intake  NUTRITION DIAGNOSIS:   Severe Malnutrition related to acute illness (cervical spine epidural abscess) as evidenced by moderate fat depletion,moderate muscle depletion,percent weight loss (21.4% weight loss in less than 1 month).  Ongoing, being addressed via supplements  GOAL:   Patient will meet greater than or equal to 90% of their needs  Progressing  MONITOR:   PO intake,Supplement acceptance,Labs,Weight trends,Skin  REASON FOR ASSESSMENT:   Malnutrition Screening Tool    ASSESSMENT:   65 year old male PMH of uncontrolled T2DM, HTN. Pt was found to have a cervical spine epidural abscess and was taken emergently to the OR and had a laminectomy and abscess evacuation. Pt also was found to have a right forearm abscess and is s/p I&D. 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 Admitted to CIR on 4/03.  4/25 - clear liquid diet due to ileus 4/29 - diet advanced to dysphagia 2  Noted d/c date of 5/06.  Per abdominal x-ray yesterday, "improving gaseous distention of the small bowel in the left upper quadrant, consistent with resolving ileus." No evidence of SBO.  Spoke with pt and daughter-in-law at bedside. Pt reports good BM this morning and nausea is improving. Pt consumed a good portion of fruit this morning with breakfast and has a protein shake from home. Pt's daughter-in-law reports that list night pt completed almost 100% of a fish dinner that family brought in.  Pt likes the Boost Breeze supplements but  also enjoys the Ensure Enlive supplements. Now that pt is off of clear liquid diet, will reorder Ensure Enlive as these provide more kcal and protein.  Provided diet education and handout regarding high-calorie, high-protein foods for pt to consume after discharge. Daughter-in-law appreciative of information.  Meal Completion: 0-50% x last 8 documented meals  Medications reviewed and include: dulcolax, Boost Breeze TID, SSI levemir 18 units daily, MVI with minerals, miralax, senna  Labs form 5/02 reviewed: sodium 134, potassium 3.3 CBG's: 105-134 x 24 hours  Diet Order:   Diet Order            DIET DYS 2 Room service appropriate? Yes; Fluid consistency: Thin  Diet effective now           Diet - low sodium heart healthy                 EDUCATION NEEDS:   Education needs have been addressed  Skin:  Skin Assessment: Skin Integrity Issues: Incisions: cervical, R arm Other: MASD to groin  Last BM:  12/29/20 type 6  Height:   Ht Readings from Last 1 Encounters:  11/28/20 5\' 7"  (1.702 m)    Weight:   Wt Readings from Last 1 Encounters:  12/23/20 75.7 kg    BMI:  Body mass index is 26.14 kg/m.  Estimated Nutritional Needs:   Kcal:  1850-2050  Protein:  85-100 grams  Fluid:  1.8-2.0 L    12/25/20, MS, RD, LDN Inpatient Clinical Dietitian Please see [facility] for contact information.

## 2020-12-29 NOTE — Progress Notes (Signed)
Occupational Therapy Session Note  Patient Details  Name: Daniel Cline MRN: [id number] Date of Birth: 01/19/1956  Today's Date: 12/29/2020 OT Individual Time: 2482-5003 OT Individual Time Calculation (min): 44 min    Short Term Goals: Week 2:  OT Short Term Goal 1 (Week 2): Pt will improve R hand grip strength to 20# to perform with self feeding/grooming with Supervision OT Short Term Goal 1 - Progress (Week 2): Progressing toward goal OT Short Term Goal 2 (Week 2): Pt will transition supine > sit w/ Max of 1 in prep for ADL OT Short Term Goal 2 - Progress (Week 2): Met OT Short Term Goal 3 (Week 2): Pt will roll L/R with Max A of 1 to assist with bed level ADL OT Short Term Goal 3 - Progress (Week 2): Met OT Short Term Goal 4 (Week 2): Pt will tolerate HOB >50* elevation for 5+ minutes during IADL/ADL OT Short Term Goal 4 - Progress (Week 2): Progressing toward goal OT Short Term Goal 5 (Week 2): Pt will thread pants bed level with reacher Mod A OT Short Term Goal 5 - Progress (Week 2): Progressing toward goal Week 3:  OT Short Term Goal 1 (Week 3): STGs = LTGs d/t ELOS OT Short Term Goal 1 - Progress (Week 3): Progressing toward goal  Skilled Therapeutic Interventions/Progress Updates:    Pt greeted at time of session supine in bed resting agreeable to OT session, stating he has been nauseous and "not feeling well" today. Oral hygiene with Supervision bed level with built up handle, as he wanted to do this prior to eating breakfast. Decided to try sitting up prior to eating breakfast as he has been having nausea. Supine > sit EOB with Max A and only able to tolerate <15 seconds d/t 8/10 pain and returned to side lying > supine. Scooting up in bed 2 helpers throughout session as pain too significant, RN present and completing med pass during this time as well including pain medicine. Focus remaining session on family ed with DIL and self feeding with build up handles and proper  positioning for food tray. Pt in bed resting with alarm on call bell in reach.   Therapy Documentation Precautions:  Precautions Precautions: Cervical,Fall Precaution Booklet Issued: No Required Braces or Orthoses: Cervical Brace Cervical Brace: Soft collar,For comfort Restrictions Weight Bearing Restrictions: No     Therapy/Group: Individual Therapy  Viona Gilmore 12/29/2020, 7:11 AM

## 2020-12-29 NOTE — Progress Notes (Signed)
Patient ID: Daniel Cline, male   DOB: May 07, 1956, 65 y.o.   MRN: [id number]  Met with pt and spoke with [facility] via telephone to discuss team conference update of minimal progress and the need for MRI of neck and KUB prior to discharge on Friday 5/6. Have ordered hospital bed via [facility] they will reach out to son or daughter in-law to set up delivery and payment. Son to order wheelchair from [facility]. Discussed going home via ambulance and not car. Son is agreeable to this. Family is here daily for education and learning pt's care. MD weaning and decreasing his oxycodone, but will go home on some-son ok with this. Continue to work on discharge Friday.

## 2020-12-29 NOTE — Progress Notes (Signed)
PROGRESS NOTE   Subjective/Complaints:  Pt reports had N/V again yesterday- thinks due to taking so many pills at once- might have bene on empty stomach- also didn't take anti-nausea medicine at the time.   Per family, wants me to wean pt off MS Contin prior to d/c- however, just said something yesterday- will wean night time dose -so decrease MS Contin to 1x/day in AM, but pt is emphatic needs for therapy.   ROS:   Pt denies SOB, abd pain, CP, N/V/C/D, and vision changes   Objective:   No results found. Recent Labs    12/28/20 0629  WBC 10.4  HGB 11.0*  HCT 35.3*  PLT 331   Recent Labs    12/28/20 0629  NA 134*  K 3.3*  CL 97*  CO2 27  GLUCOSE 96  BUN 16  CREATININE 0.82  CALCIUM 9.1    Intake/Output Summary (Last 24 hours) at 12/29/2020 0854 Last data filed at 12/29/2020 0520 Gross per 24 hour  Intake 118 ml  Output 1000 ml  Net -882 ml        Physical Exam: Vital Signs Blood pressure 114/73, pulse 77, temperature 98.7 F (37.1 C), temperature source Oral, resp. rate 16, height '5\' 7"'  (1.702 m), weight 75.7 kg, SpO2 94 %.     General: awake, alert, appropriate,  After woke him up; laying supine in bed; NAD HENT: conjugate gaze; oropharynx moist; posterior neck- only 1 small scab- otherwise healed completely.  CV: regular rate; no JVD Pulmonary: CTA B/L; no W/R/R- good air movement GI: soft, NT, ND, (+)BS Psychiatric: appropriate, more interactive Neurological: alert  Musculoskeletal:     Comments: LUE-strength in his Left arm is 4-/5-in the deltoid, bicep, tricep, wrist extension, grip, and finger abduction RUE-deltoid bicep and tricep are 3/5, wrist extension and grip are 3-/5, LLE-4/5 in hip flexion, knee extension, and dorsiflexion as well as plantarflexion RLE-strength is 4-/5-in the same muscles TTP over L shoulder and L upper trap areas Skin:    General: Skin is warm and dry.      Comments:  PICC line in his left upper extremity looks good  Neurological:  He reports his light touch is intact in all 4 extremities He has no lower extremity clonus bilaterally  Assessment/Plan: 1. Functional deficits which require 3+ hours per day of interdisciplinary therapy in a comprehensive inpatient rehab setting.  Physiatrist is providing close team supervision and 24 hour management of active medical problems listed below.  Physiatrist and rehab team continue to assess barriers to discharge/monitor patient progress toward functional and medical goals  Care Tool:  Bathing  Bathing activity did not occur: Safety/medical concerns (Per OT eval on medical hold) Body parts bathed by patient: Chest,Abdomen,Face,Left arm   Body parts bathed by helper: Right arm,Buttocks,Right lower leg,Left lower leg     Bathing assist Assist Level: Moderate Assistance - Patient 50 - 74%     Upper Body Dressing/Undressing Upper body dressing   What is the patient wearing?: Pull over shirt    Upper body assist Assist Level: Maximal Assistance - Patient 25 - 49%    Lower Body Dressing/Undressing Lower body dressing  What is the patient wearing?: Pants     Lower body assist Assist for lower body dressing: Maximal Assistance - Patient 25 - 49%     Toileting Toileting    Toileting assist Assist for toileting: Dependent - Patient 0% (per staff  report, declined at eval. also limited by pain) Assistive Device Comment: foley   Transfers Chair/bed transfer  Transfers assist  Chair/bed transfer activity did not occur: Refused  Chair/bed transfer assist level: Dependent - Patient 0% (lateral slide)     Locomotion Ambulation   Ambulation assist   Ambulation activity did not occur: Safety/medical concerns          Walk 10 feet activity   Assist  Walk 10 feet activity did not occur: Safety/medical concerns        Walk 50 feet activity   Assist Walk 50 feet with  2 turns activity did not occur: Safety/medical concerns         Walk 150 feet activity   Assist Walk 150 feet activity did not occur: Safety/medical concerns         Walk 10 feet on uneven surface  activity   Assist Walk 10 feet on uneven surfaces activity did not occur: Safety/medical concerns         Wheelchair     Assist Will patient use wheelchair at discharge?: Yes Type of Wheelchair: Manual Wheelchair activity did not occur: Refused  Wheelchair assist level: Dependent - Patient 0%      Wheelchair 50 feet with 2 turns activity    Assist    Wheelchair 50 feet with 2 turns activity did not occur: Refused   Assist Level: Dependent - Patient 0%   Wheelchair 150 feet activity     Assist  Wheelchair 150 feet activity did not occur: Refused   Assist Level: Dependent - Patient 0%   Blood pressure 114/73, pulse 77, temperature 98.7 F (37.1 C), temperature source Oral, resp. rate 16, height '5\' 7"'  (1.702 m), weight 75.7 kg, SpO2 94 %.  Medical Problem List and Plan: 1.  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  Acute DVT in peroneal/soleus: eliquis restarted 3. Pain Management: Patient is already receiving oxycodone as needed; we started tramadol scheduled but that is not working; will DC tramadol and schedule MS Contin 15 mg twice daily  - on Valium and Firoicet prn per NSU  D/c cymbalta which may be causing emesis.   Continue MS Contin as pain and ability to participate in therapy limited by pain. XR cervical spine stable, soft collar ordered.   4/27- pain sounds like down to 5/10 after d/w pt what he means- it's not 8/10 based on description, con't regimen  4/29- pt working better with therapy-  slowly- and pain is "slightly better"- so guessing around 4/10 at rest  5/1: requested kpad be given for his upper back pain, continue  5/2- like lidoderm and kpad- cannot use together.   5/3- will wean MS Contin to 15 mg qAM but don't feel comfortable stopping it altogether by d/c.  4. Mood: - Messaged SW regarding adding Mr. Minehart to Neuropsychologyschedule due  to uncontrolled pain and new SCI.   4/27- will add Buspar 5 mg TID and change as required  5/2- less anxious- doing better with Buspar- con't regimen             -antipsychotic agents: N/A 5. Neuropsych: This patient is capable of making decisions on his own behalf. 6. Skin/Wound Care: We will change his honeycomb dressing on his neck to daily and as needed; will also change the dressing on his right forearm to just a dry dressing and change as needed. Less drainage today, will not require I&D  4/23 minimal drainage, almost healed incision- continue follow/monitor 7. Fluids/Electrolytes/Nutrition: We will check labs on Monday 8. Uncontrolled Diabetes II- A1c of 13.9-continue patient's Levemir 10 units nightly and sliding scale insulin  4/23- CBGs bordereline. increase Levemir to 18 units nightly.    5/3- BGs 103-129- doing great- con't regimen 9. Hypertension hx with Orthostatic hypotension-patient is on HCTZ 25 mg daily as well as Norvasc 10 mg daily and lisinopril 40 mg daily and Lopressor 50 mg twice daily-blood pressure is well controlled we will continue these medications and monitor especially with increased activity  4/15: Have reduced Lisinopril, HCTZ, Norvasc and con't Lopressor- still having episode of orthostatic hypotension- might need additional reduction- don't decreased B Blocker due to tachycardia frequently  4/29- BP controlled- no Orthostasis- con't regimen  4/30: SBP soft to 70s, decrease norvasc to 2.28m Vitals:   12/28/20 2032 12/29/20 0518  BP: 120/81 114/73  Pulse: 81 77  Resp: 18 16  Temp: 98 F (36.7 C) 98.7  F (37.1 C)  SpO2: 97% 94%    5/3- BP doing better- 110s-120s over 70s-80s- con't regimen 10.  Neurogenic bowel-we will look to start him on a bowel program nightly after dinner with Dulcolax suppository  4/15- KUB showed diffuse constipation- will give Sorbitol 60 ml to clean him out  4/25- full of stool AGAIN- even though on bowel program and on bowel meds- not sure if refusing?- Has diffuse ileus and severe constipation-will give Sorbitol again 60 ml= put on liquid diet- and increase bowel meds  4/26- pt thought was "OK" to refuse bowel meds and bowel program- explained is so ful of stool, needs 10+ BMs to clean out- ordered Mg citrate- and f/u KUB  4/27- KUB not much better- but will con't liquid diet- doesn't want NG tube- to finish Mg citrate today- d/w pt and family.   4/28- will recheck KUB today and also ordered Mg citrate again and Fleets enema at 3pm- pt said IF KUB worse, willing to do NGT, but only if worse  4/29- moderate stool on new KUB- ileus resolved- and had 3 BMs since KUB done- will switch back to regular diet and con't bowel program  5/3- will recheck KKUB since had N/V last afternoon, just in case-  12.  Acute DVT in the soleus and peroneal-we will change his Lovenox to apixaban/Eliquis 10 mg BID x 1 week, then 5 mg BID.  13.  Neurogenic bladder-we will continue his Foley for now and start Flomax tomorrow 0.4 mg q. supper and see if he is able to start to void;  we might need to titrate this as required- will try and remove foley next week.   4/27- will start Chronic foley- since pt doesn't want family cathing him.  5/3- since just started last week, will not change before d/c- but will need H/H nursing or Urology to change.    14. Hypokalemia: supplement 467mBID  today and repeat BMP tomorrow  4/8- K+ 3.5- borderline low- will recheck Monday  4/11- K+ 3.3 will replete 40 mEq x2 and recheck Thursday   5/2- K+ down to 3.3- had been holding his own- will replete today 40 mEq  x1  5/3- will recheck BMP in AM 15. Epidural abscess-  4/5- on  Cefazolin IV for prolonged IV ABX- pt's WBC down to 15.5 (was 14k yesterday, but 20.8 3 days ago)- low grade temps up to 100.2 in last 24 hours-will con't regimen   4/6-White count is down slightly to 14K-we will continue IV antibiotics and check CRP and ESR-weekly  4/7- afebrile- will recheck labs in AM  4/8- WBC stable- con't IV ABX.   4/11- WBC down to 11.3- ESR >140 and CRP 10.9- heading a little better- con't to monitor.   4/15 WBC 9.8, monitor weekly  5/2- WBC 10.4-  5/3- CRP down to 1.4 and ESR down to 82-  Need to call ID to see what plan is since about to finish IV ABX.  16. Dysphagia?  D2 diet due to lack of teeth, not due to dysphagia- doesn't need pills crushed. 17. Vomiting: d/c cymbalta which may be contributing. IV zofran. KUB ordered and shows no abdominal pathology.   -4/23 improved off iron, reglan dc'ed  4/25- still nauseated- due to ileus  4/26- on liquid diet- con't regimen  4/27- ileus not much better- conn't liquid diet/Mg citrate-   4/28- as above- only NGT if KUB worse- pt agreed to this compromise- added another dose mg citrate and fleets enema.    4/29- ileus resolved 18. Atelectasis: Recommend incentive spirometer to improve breathing strength and ability over time. Provided incentive spirometer education. 19. Lethargy: decreased valium to 66m BID.    LOS: 31 days A FACE TO FACE EVALUATION WAS PERFORMED  Daniel Cline 12/29/2020, 8:54 AM

## 2020-12-29 NOTE — Progress Notes (Signed)
Physical Therapy Session Note  Patient Details  Name: Daniel Cline MRN: [id number] Date of Birth: 07/31/1956  Today's Date: 12/29/2020 PT Individual Time: 1430-1500 PT Individual Time Calculation (min): 30 min   Short Term Goals: Week 1:  PT Short Term Goal 1 (Week 1): Pt will tolerate sitting in appropriate wc x 1 hr to allow for participation in therapy session PT Short Term Goal 1 - Progress (Week 1): Met PT Short Term Goal 2 (Week 1): Pt will roll L/R w/mod assist and cues PT Short Term Goal 2 - Progress (Week 1): Met PT Short Term Goal 3 (Week 1): Pt will tolerate sitting on edge of bed/mat x 10 min w/occasional repositioning if needed and mod assist PT Short Term Goal 3 - Progress (Week 1): Not met Week 2:  PT Short Term Goal 1 (Week 2): pt to demonstrate upright sitting position at least ~75 degrees to increase safety with transfers and sitting tolerance PT Short Term Goal 1 - Progress (Week 2): Not met PT Short Term Goal 2 (Week 2): pt to demonstrate supine>sit mod A x1 PT Short Term Goal 2 - Progress (Week 2): Not met PT Short Term Goal 3 (Week 2): Pt will tolerate sitting on edge of bed/mat x 10 min w/occasional repositioning if needed and mod assist PT Short Term Goal 3 - Progress (Week 2): Not met Week 3:  PT Short Term Goal 1 (Week 3): pt to demonstrate upright sitting position at least ~75 degrees to increase safety with transfers and sitting tolerance PT Short Term Goal 1 - Progress (Week 3): Met PT Short Term Goal 2 (Week 3): pt to demonstrate supine>sit mod A x1 PT Short Term Goal 2 - Progress (Week 3): Not met PT Short Term Goal 3 (Week 3): Pt will tolerate sitting on edge of bed/mat x 10 min w/occasional repositioning if needed and mod assist PT Short Term Goal 3 - Progress (Week 3): Revised due to lack of progress Week 4:  PT Short Term Goal 1 (Week 4): pt to demonstrate supine>sit mod A x1 PT Short Term Goal 2 (Week 4): pt to demonstrate functional transfer at  max A x1 for safety with DC home PT Short Term Goal 3 (Week 4): Pt will tolerate sitting on edge of bed/mat x 5 min w/occasional repositioning if needed and mod assist  Skilled Therapeutic Interventions/Progress Updates:    pt received in bed and agreeable to therapy. Pt directed in x2 supine<>sit max A both times with VC for increased active participation to come to sitting however pt verbalized he was unable. Pt sat EOB mod A for 2 mins each time. Pt unable to active core efficiently to pull self to upright sitting and benefited from BUE use for support and pt able to achieve at least 75 degrees. Pt refused to attempt transfer to WC. Pt returned to supine, All needs in reach and in good condition. Call light in hand.  And alarm set. PT educated pt on importance of upright sitting, increased mobility. Pt agreed but denied further mobility.   Therapy Documentation Precautions:  Precautions Precautions: Cervical,Fall Precaution Booklet Issued: No Required Braces or Orthoses: Cervical Brace Cervical Brace: Soft collar,For comfort Restrictions Weight Bearing Restrictions: No General:   Vital Signs: Therapy Vitals Temp: 97.8 F (36.6 C) Pulse Rate: 70 Resp: 17 BP: 111/62 Patient Position (if appropriate): Lying Oxygen Therapy SpO2: 97 % O2 Device: Room Air Pain:   Mobility:   Locomotion :    Trunk/Postural   Assessment :    Balance:   Exercises:   Other Treatments:      Therapy/Group: Individual Therapy  Haley S Morelli 12/29/2020, 3:34 PM  

## 2020-12-29 NOTE — Progress Notes (Signed)
[facility]  Date of Admission:  11/28/2020     CC: Cervical om/epidural abscess  Lines: 3/28-c LUE picc  Abx: 3/25-27; 3/30-c cefazolin 3/27-29 nafcillin  ASSESSMENT: c3-c7 epidural abscess s/p I&D 3/23; cx mssa (S doxy/bactrim) Right forearm pyomyositis s/p I&D 3/25; cx mssa  bcx negative tte 35% ef, severe AI  Patient finishing planned 8 weeks iv abx in the inpatient rehab. His crp is still elevated. The c-spine posterior incision still has one spot of not fully healed tissue with slight serosanguinous discharge. His back pain is much better however  Crp: 5/02       1.4 4/25       4.4 4/18       3.3 4/11      10.9 3/30      12.0  PLAN: 1. Initial end date of cefazolin in a few days for 8 week course, but can transition to oral abx now 2. Given the incompletely healed incision although doesn't appear grossly infected, elevated crp and prior extensive abscess, will continue him on 4 weeks of oral abx cefadroxil 1 g bid for now 3. Continue to trend weekly crp while inpatient 4. Mri c-spine 5. F/u id clinic as below for further determination of treatment length    Clinic Follow Up Appt: 5/20 @ 1115 with dr Luciana Axe  @  [facility] [address], [place], [place] [address] Phone: [phone]    I spent more than 35 minute reviewing data/chart, and coordinating care and >50% direct face to face time providing counseling/discussing diagnostics/treatment plan with patient   Principal Problem:   Cervical myelopathy (HCC) Active Problems:   Epidural intraspinal abscess   Hypertension associated with diabetes (HCC)   Uncontrolled type 2 diabetes mellitus with hyperglycemia (HCC)   Neurogenic bowel   Neurogenic bladder   Protein-calorie malnutrition, severe   Pain   No Known Allergies  Scheduled Meds: . acetaminophen  650 mg Oral Q4H  . amLODipine  2.5 mg Oral Daily  . apixaban  5 mg Oral BID  . bisacodyl  10 mg  Rectal Daily  . busPIRone  5 mg Oral TID  . cefadroxil  1,000 mg Oral Q12H  . Chlorhexidine Gluconate Cloth  6 each Topical BID  . diazepam  1 mg Oral BID  . feeding supplement  1 Container Oral TID BM  . hydrochlorothiazide  12.5 mg Oral Daily  . insulin aspart  0-15 Units Subcutaneous TID WC  . insulin detemir  18 Units Subcutaneous QHS  . lidocaine  3 patch Transdermal Q24H  . lisinopril  5 mg Oral QHS  . liver oil-zinc oxide   Topical BID  . metoprolol tartrate  50 mg Oral BID  . [START ON 12/30/2020] morphine  15 mg Oral Daily  . multivitamin with minerals  1 tablet Oral Daily  . polyethylene glycol  17 g Oral BID  . senna-docusate  2 tablet Oral BID  . tamsulosin  0.8 mg Oral QPC supper   Continuous Infusions: PRN Meds:.bisacodyl, butalbital-acetaminophen-caffeine, ondansetron **OR** ondansetron (ZOFRAN) IV, oxyCODONE, sodium chloride flush   SUBJECTIVE: Patient doing well Pain improved in the neck but still moderate here and there depending on his position/what he is doing The cervical incision not fully healed No f/c/n/v/diarrhea  Getting around in wheel chair now; doing physical therapy  On cefazolin -- labs without hematologic suppression  Review of Systems: ROS All other ROS was negative, except mentioned  above     OBJECTIVE: Vitals:   12/28/20 0527 12/28/20 1434 12/28/20 2032 12/29/20 0518  BP: 110/63 107/64 120/81 114/73  Pulse: 75 71 81 77  Resp: 18 17 18 16   Temp: 98 F (36.7 C) 97.6 F (36.4 C) 98 F (36.7 C) 98.7 F (37.1 C)  TempSrc: Oral Oral  Oral  SpO2: 95% 95% 97% 94%  Weight:      Height:       Body mass index is 26.14 kg/m.  Physical Exam General/constitutional: no distress, pleasant HEENT: Normocephalic, PER, Conj Clear, EOMI, Oropharynx clear Neck supple CV: rrr no mrg Lungs: clear to auscultation, normal respiratory effort Abd: Soft, Nontender Ext: no edema Skin: posterior incision mostly healed; 1 spot about 1 cm eschar with  serosanguinous discharge; no redness/fluctuance/tenderness Neuro: nonfocal MSK: 3-4/5 lower ext strength  Central line presence: lue picc site no erythema/tenderness/purulence   Lab Results Lab Results  Component Value Date   WBC 10.4 12/28/2020   HGB 11.0 (L) 12/28/2020   HCT 35.3 (L) 12/28/2020   MCV 99.2 12/28/2020   PLT 331 12/28/2020    Lab Results  Component Value Date   CREATININE 0.82 12/28/2020   BUN 16 12/28/2020   NA 134 (L) 12/28/2020   K 3.3 (L) 12/28/2020   CL 97 (L) 12/28/2020   CO2 27 12/28/2020    Lab Results  Component Value Date   ALT 5 12/28/2020   AST 15 12/28/2020   GGT 178 (H) 11/24/2020   ALKPHOS 92 12/28/2020   BILITOT 0.4 12/28/2020      Microbiology: No results found for this or any previous visit (from the past 240 hour(s)).   Serology:   Imaging: If present, new imagings (plain films, ct scans, and mri) have been personally visualized and interpreted; radiology reports have been reviewed. Decision making incorporated into the Impression / Recommendations.  3/23 mri c spine 1. Prominent multiloculated fluid collections involving the paraspinal musculature from the level of C1 through C7. This collection communicates with and extensive epidural fluid collection through the interlaminar space at the C3-4 level. The epidural collection extends from C2 through the visualized upper thoracic spine. 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 Recommend MRI of the thoracic spine without and with contrast to evaluate for disease extension. 2. There is mass effect on the cord with obliteration of the CSF space at C5-6 and C6-7. 3. Multilevel degenerative changes of the cervical spine, as described above   3/26 mri t spine 1. No thoracic epidural fluid collection. 2. Thin epidural enhancement of the visualized lower cervical spine extending inferiorly to the T1-T2 level without evidence of residual epidural fluid  collection. Findings are likely secondary to known infection and recent surgery. 3. Postsurgical changes within the posterior paraspinal soft tissues of the lower cervical spine are partially imaged at the edge of the field of view. Simple appearing fluid collection at the laminectomy bed within the lower cervical spine seen only on sagittal sequences at the edge of the field of view measuring approximately 2.8 x 2.1 cm. 4. Intramuscular edema within the paraspinal musculature of the lower cervical spine, which may reflect postsurgical change and/or Myositis.    4/26, MD [facility] [phone] pager    12/29/2020, 12:19 PM

## 2020-12-30 ENCOUNTER — Other Ambulatory Visit (HOSPITAL_COMMUNITY): Payer: Self-pay

## 2020-12-30 LAB — BASIC METABOLIC PANEL
Anion gap: 8 (ref 5–15)
BUN: 13 mg/dL (ref 8–23)
CO2: 25 mmol/L (ref 22–32)
Calcium: 8.9 mg/dL (ref 8.9–10.3)
Chloride: 101 mmol/L (ref 98–111)
Creatinine, Ser: 0.79 mg/dL (ref 0.61–1.24)
GFR, Estimated: >60 mL/min (ref >60)
Glucose, Bld: 146 mg/dL — ABNORMAL HIGH (ref 70–99)
Potassium: 3.3 mmol/L — ABNORMAL LOW (ref 3.5–5.1)
Sodium: 134 mmol/L — ABNORMAL LOW (ref 135–145)

## 2020-12-30 LAB — GLUCOSE, CAPILLARY
Glucose-Capillary: 105 mg/dL — ABNORMAL HIGH (ref 70–99)
Glucose-Capillary: 135 mg/dL — ABNORMAL HIGH (ref 70–99)
Glucose-Capillary: 155 mg/dL — ABNORMAL HIGH (ref 70–99)
Glucose-Capillary: 139 mg/dL — ABNORMAL HIGH (ref 70–99)

## 2020-12-30 MED ORDER — ALTEPLASE 2 MG IJ SOLR
2.0000 mg | Freq: Once | INTRAMUSCULAR | Status: AC
  Administered 2020-12-30: 2 mg

## 2020-12-30 MED ORDER — ENSURE ENLIVE PO LIQD
237.0000 mL | Freq: Three times a day (TID) | ORAL | Status: DC
  Administered 2020-12-30 – 2021-01-01 (×5): 237 mL via ORAL

## 2020-12-30 MED ORDER — CEFADROXIL 500 MG PO CAPS
1000.0000 mg | ORAL_CAPSULE | Freq: Two times a day (BID) | ORAL | 0 refills | Status: AC
  Filled 2020-12-30: qty 112, 28d supply, fill #0

## 2020-12-30 MED ORDER — SORBITOL 70 % SOLN
30.0000 mL | Freq: Once | Status: AC
  Administered 2020-12-30: 30 mL via ORAL
  Filled 2020-12-30: qty 30

## 2020-12-30 NOTE — Progress Notes (Signed)
[facility]  Date of Admission:  11/28/2020     CC: Cervical om/epidural abscess  Lines: 3/28-c LUE picc  Abx: 3/25-27; 3/30-c cefazolin 3/27-29 nafcillin  ASSESSMENT: c3-c7 epidural abscess s/p I&D 3/23; cx mssa (S doxy/bactrim) Right forearm pyomyositis s/p I&D 3/25; cx mssa  bcx negative tte 35% ef, severe AI  Patient finishing planned 8 weeks iv abx in the inpatient rehab. His crp is still elevated. The c-spine posterior incision still has one spot of not fully healed tissue with slight serosanguinous discharge. His back pain is much better however  Crp: 5/02       1.4 4/25       4.4 4/18       3.3 4/11      10.9 3/30      12.0  12/30/2020 Mri c-spine done -- reviewed. There is a small fluid collection that is described as seroma. It is unclear at this time if that is infected or not. Certainly it could be. As above mentioned, given elevated crp, the wound incision not completely healed, and the mri finding, would keep on current oral abx for at least 4 weeks. Further duration/tx decision to be made in ID clinic with repeat mri  PLAN: 1. Continue cefadroxil 1 gram po bid; plan 1 month until 6/02 2. Continue to trend weekly crp while inpatient 3. Follow up with Dr Luciana Axe in ID clinic as followed.   Clinic Follow Up Appt: 5/20 @ 1115 with dr Luciana Axe  @  [facility] [address], [place], [place] [address] Phone: [phone]   I spent more than 20 minute reviewing data/chart, and coordinating care and >50% direct face to face time providing counseling/discussing diagnostics/treatment plan with patient   Principal Problem:   Cervical myelopathy (HCC) Active Problems:   Epidural intraspinal abscess   Hypertension associated with diabetes (HCC)   Uncontrolled type 2 diabetes mellitus with hyperglycemia (HCC)   Neurogenic bowel   Neurogenic bladder   Protein-calorie malnutrition, severe   Pain   No Known  Allergies  Scheduled Meds: . acetaminophen  650 mg Oral Q4H  . amLODipine  2.5 mg Oral Daily  . apixaban  5 mg Oral BID  . bisacodyl  10 mg Rectal Daily  . busPIRone  5 mg Oral TID  . cefadroxil  1,000 mg Oral Q12H  . Chlorhexidine Gluconate Cloth  6 each Topical BID  . diazepam  1 mg Oral BID  . feeding supplement  237 mL Oral TID BM  . hydrochlorothiazide  12.5 mg Oral Daily  . insulin aspart  0-15 Units Subcutaneous TID WC  . insulin detemir  18 Units Subcutaneous QHS  . lidocaine  3 patch Transdermal Q24H  . lisinopril  5 mg Oral QHS  . liver oil-zinc oxide   Topical BID  . metoprolol tartrate  50 mg Oral BID  . morphine  15 mg Oral Daily  . multivitamin with minerals  1 tablet Oral Daily  . polyethylene glycol  17 g Oral BID  . senna-docusate  2 tablet Oral BID  . sorbitol  30 mL Oral Once  . tamsulosin  0.8 mg Oral QPC supper   Continuous Infusions: PRN Meds:.bisacodyl, butalbital-acetaminophen-caffeine, ondansetron **OR** ondansetron (ZOFRAN) IV, oxyCODONE, sodium chloride flush   SUBJECTIVE: Doing well No events overnight No n/v/diarrhea  Mri neck reviewed   Review of Systems: ROS All other ROS was negative, except mentioned above  OBJECTIVE: Vitals:   12/29/20 0518 12/29/20 1434 12/29/20 2012 12/30/20 0421  BP: 114/73 111/62 118/67 125/71  Pulse: 77 70 73 84  Resp: 16 17 16 18   Temp: 98.7 F (37.1 C) 97.8 F (36.6 C) 98.6 F (37 C) 98.4 F (36.9 C)  TempSrc: Oral  Oral Oral  SpO2: 94% 97% 95% 97%  Weight:      Height:       Body mass index is 26.14 kg/m.  Physical Exam General/constitutional: no distress, pleasant HEENT: Normocephalic, PER, Conj Clear, EOMI, Oropharynx clear Neck supple CV: rrr no mrg Lungs: clear to auscultation, normal respiratory effort Abd: Soft, Nontender Ext: no edema Skin: No Rash -- incision with stable small area of crusting/serosanguinous discharge still Neuro: nonfocal MSK: no peripheral joint  swelling/tenderness/warmth; back spines nontender      Lab Results Lab Results  Component Value Date   WBC 10.4 12/28/2020   HGB 11.0 (L) 12/28/2020   HCT 35.3 (L) 12/28/2020   MCV 99.2 12/28/2020   PLT 331 12/28/2020    Lab Results  Component Value Date   CREATININE 0.79 12/30/2020   BUN 13 12/30/2020   NA 134 (L) 12/30/2020   K 3.3 (L) 12/30/2020   CL 101 12/30/2020   CO2 25 12/30/2020    Lab Results  Component Value Date   ALT 5 12/28/2020   AST 15 12/28/2020   GGT 178 (H) 11/24/2020   ALKPHOS 92 12/28/2020   BILITOT 0.4 12/28/2020      Microbiology: No results found for this or any previous visit (from the past 240 hour(s)).   Serology:   Imaging: If present, new imagings (plain films, ct scans, and mri) have been personally visualized and interpreted; radiology reports have been reviewed. Decision making incorporated into the Impression / Recommendations.  3/23 mri c spine 1. Prominent multiloculated fluid collections involving the paraspinal musculature from the level of C1 through C7. This collection communicates with and extensive epidural fluid collection through the interlaminar space at the C3-4 level. The epidural collection extends from C2 through the visualized upper thoracic spine. 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 Recommend MRI of the thoracic spine without and with contrast to evaluate for disease extension. 2. There is mass effect on the cord with obliteration of the CSF space at C5-6 and C6-7. 3. Multilevel degenerative changes of the cervical spine, as described above   3/26 mri t spine 1. No thoracic epidural fluid collection. 2. Thin epidural enhancement of the visualized lower cervical spine extending inferiorly to the T1-T2 level without evidence of residual epidural fluid collection. Findings are likely secondary to known infection and recent surgery. 3. Postsurgical changes within the posterior  paraspinal soft tissues of the lower cervical spine are partially imaged at the edge of the field of view. Simple appearing fluid collection at the laminectomy bed within the lower cervical spine seen only on sagittal sequences at the edge of the field of view measuring approximately 2.8 x 2.1 cm. 4. Intramuscular edema within the paraspinal musculature of the lower cervical spine, which may reflect postsurgical change and/or Myositis.  5/23 mri cspine no contrast 1. Posterior decompression from C3-7 with improved patency of the spinal canal at multiple levels. 2. Unchanged ventral spinal cord contact by left subarticular disc-osteophyte complexes at C4-6. 3. Fluid collection in the posterior soft tissues at the C5-7 levels, measuring up to 4.0 x 2.4 x 3.1 cm, likely seroma.  6/23, MD [facility] [phone] pager    12/30/2020, 1:52 PM

## 2020-12-30 NOTE — Progress Notes (Signed)
Inpatient Rehabilitation Care Coordinator Discharge Note  The overall goal for the admission was met for:   Discharge location: Yes-HOME WITH SON-MARK AND DAUGHTER IN-LAW-HEATHER  Length of Stay: Yes-33 DAYS  Discharge activity level: Yes-MOD/MAX LEVEL  Home/community participation: Yes  Services provided included: MD, RD, PT, OT, SLP, RN, CM, Pharmacy, Neuropsych and SW  Financial Services: Medicare part A only  Choices offered to/list presented to:pt, son and daughter in-law  Follow-up services arranged: Home Health: [facility]-PT, OT, RN, DME: [facility] and Patient/Family has no preference for HH/DME agencies [place]  Comments (or additional information):SON AND DAUGHTER IN-LAW HERE DAILY AND PARTICIPATING IN PT'S THERAPIES, FEELS COMFORTABLE [place] WILL DO BETTER THERE.  Patient/Family verbalized understanding of follow-up arrangements: Yes  Individual responsible for coordination of the follow-up plan: MARK-SON [phone]  Confirmed correct DME delivered: Elease Hashimoto 12/30/2020    Geneen Dieter, [place] Rhyme

## 2020-12-30 NOTE — Progress Notes (Addendum)
PICC assessment for labs: No blood return. Alteplase instilled. Noted in imaging afterward that PICC tip is malpositioned. Discussed with RN.   Recommend removal; appears IV meds completed. Discussed with Daniel Oka, PA: Providers will address after rounds today.   If PICC is still needed, IV Team can try power flush to correct tip placement.

## 2020-12-30 NOTE — Progress Notes (Signed)
Physical Therapy Session Note  Patient Details  Name: Daniel Cline MRN: [id number] Date of Birth: 26-Feb-1956  Today's Date: 12/30/2020 PT Individual Time: 1300-1354 PT Individual Time Calculation (min): 54 min   Short Term Goals: Week 4:  PT Short Term Goal 1 (Week 4): pt to demonstrate supine>sit mod A x1 PT Short Term Goal 2 (Week 4): pt to demonstrate functional transfer at max A x1 for safety with DC home PT Short Term Goal 3 (Week 4): Pt will tolerate sitting on edge of bed/mat x 5 min w/occasional repositioning if needed and mod assist  Skilled Therapeutic Interventions/Progress Updates:   Pt received supine in bed and agreeable to PT. Supine>sit transfer training with max assist x 4 throughout session. Pt able to tolerate sitting 74mn  X 2 seconds. 2 min and 4 minutes. Pt reports dizziness on first 2 bouts. And requesting return to supine. PT educated family member in donning ted hose and ace wrap for BP control. Once ace wrap and teds in place pt noted to have increased tolerance to sitting EOB. Pain in back fluctuates from 4-7/10 in sitting. Pain reduced to 0/10 per pt once in supine following each bout of sitting. Pt returned to room and performed Sit>supine completed with max assist and left supine in bed with call bell in reach and all needs met.        Therapy Documentation Precautions:  Precautions Precautions: Cervical,Fall Precaution Booklet Issued: No Required Braces or Orthoses: Cervical Brace Cervical Brace: Soft collar,For comfort Restrictions Weight Bearing Restrictions: No    Vital Signs: Therapy Vitals Temp: 98.4 F (36.9 C) Pulse Rate: 73 Resp: 18 BP: 103/62 Patient Position (if appropriate): Lying Oxygen Therapy SpO2: 96 % O2 Device: Room Air Pain: See above.    Therapy/Group: Individual Therapy  ALorie Phenix5/11/2020, 3:26 PM

## 2020-12-30 NOTE — Progress Notes (Signed)
Patient ID: Daniel Cline, male   DOB: [birth year], 65 y.o.   MRN: [id number]  Met with pt and daughter in-law-heather who is here to continue family education. She gave worker PCP and have placed in chart. Pt's PICC line has been removed this am and he feels good today and looking forward to going home. Heather reports hospital bed to be delivered tomorrow and wheelchair they ordered to be delivered Sat or Sun. Discussed PTAR time for Friday going home-plan for 10:00 pick up. Work toward discharge Friday.

## 2020-12-30 NOTE — Progress Notes (Signed)
Orthopedic Tech Progress Note Patient Details:  Daniel Cline 26-Apr-1956 [id number] OT called requesting a SOFT COLLAR,  Ortho Devices Type of Ortho Device: Soft collar Ortho Device/Splint Location: NECK Ortho Device/Splint Interventions: Other (comment)   Post Interventions Patient Tolerated: Well Instructions Provided: Care of device   [name] 12/30/2020, 10:18 AM

## 2020-12-30 NOTE — Progress Notes (Signed)
PROGRESS NOTE   Subjective/Complaints:  Pt did OK with reduction in MS Contin to 1x/day in AM- Refused bowel program last night- d/w nurse- has occurred "a lot"- however pt has no recollection and is also very [place] and said he didn't remember refusing- d/w nurse that need to not give him option of refusing, esp since has ileus again.   Did use lidoderm patches last night- helpful.   Less nausea today.    ROS:   Pt denies SOB, abd pain, CP, D, and vision changes   Objective:   DG Abd 1 View  Result Date: 12/29/2020 CLINICAL DATA:  Multiple vomiting episodes. EXAM: ABDOMEN - 1 VIEW COMPARISON:  12/24/2020. FINDINGS: Soft tissue structures are unremarkable. Several distended loops of small bowel noted. Stool noted throughout the colon. Colon is nondistended. No free air. Degenerative change lumbar spine. Radiopacities noted both buttocks. Vascular calcification of the pelvis. IMPRESSION: Several distended loops of small bowel noted. Stool noted throughout colon. Colon is nondistended. Although adynamic ileus could present in this fashion, follow-up abdominal series suggested to exclude developing small bowel obstruction. Electronically Signed   By: Marcello Moores  Register   On: 12/29/2020 11:06   MR CERVICAL SPINE WO CONTRAST  Result Date: 12/29/2020 CLINICAL DATA:  Postsurgical follow-up after laminectomy. EXAM: MRI CERVICAL SPINE WITHOUT CONTRAST TECHNIQUE: Multiplanar, multisequence MR imaging of the cervical spine was performed. No intravenous contrast was administered. COMPARISON:  11/18/2020 FINDINGS: Alignment: Reversal of normal cervical lordosis may be positional or due to muscle spasm. Vertebrae: Posterior decompression with bilateral laminectomies at C3-7 Cord: Normal signal and morphology. Posterior Fossa, vertebral arteries, paraspinal tissues: Fluid collection in the posterior soft tissues at the C5-7 levels measures 4.0 x 2.4 x 3.1  cm. Disc levels: C2-3: Improved patency of the spinal canal. C3-4: Posterior decompression with improved patency of the spinal canal. C4-5: Posterior decompression. Unchanged left subarticular disc osteophyte complex indenting the ventral spinal cord. Spinal canal patency is improved. C5-6: Left subarticular disc osteophyte complex contacting the ventral spinal cord. Posterior decompression. C6-7: Posterior decompression.  No spinal canal stenosis. IMPRESSION: 1. Posterior decompression from C3-7 with improved patency of the spinal canal at multiple levels. 2. Unchanged ventral spinal cord contact by left subarticular disc-osteophyte complexes at C4-6. 3. Fluid collection in the posterior soft tissues at the C5-7 levels, measuring up to 4.0 x 2.4 x 3.1 cm, likely seroma. Electronically Signed   By: Ulyses Jarred M.D.   On: 12/29/2020 23:54   DG ABD ACUTE 2+V W 1V CHEST  Result Date: 12/29/2020 CLINICAL DATA:  Nausea EXAM: DG ABDOMEN ACUTE WITH 1 VIEW CHEST COMPARISON:  12/29/2020 at 9:16 a.m. FINDINGS: Supine and upright frontal views of the abdomen as well as an upright frontal view of the chest are obtained. Cardiac silhouette is unremarkable. Left-sided PICC line is again noted, tip projecting over the right brachiocephalic confluence. Please correlate with catheter function. Lung volumes are diminished, with hypoventilatory changes at the lung bases. No effusion or pneumothorax. The distended gas-filled loops of small bowel within the left mid abdomen on previous study of decreasing caliber on this exam. Gas and stool are seen throughout the colon to the level of the  rectum. No evidence of high-grade obstruction. No masses or abnormal calcifications. No free gas in the greater peritoneal sac. IMPRESSION: 1. Improving gaseous distention of the small bowel in the left upper quadrant, consistent with resolving ileus. 2. Interval change in position of the left-sided PICC, catheter tip now projecting over the  brachiocephalic confluence on the right. Please correlate with catheter function. 3. Hypoventilatory changes at the lung bases. Electronically Signed   By: Randa Ngo M.D.   On: 12/29/2020 21:20   Recent Labs    12/28/20 0629  WBC 10.4  HGB 11.0*  HCT 35.3*  PLT 331   Recent Labs    12/28/20 0629  NA 134*  K 3.3*  CL 97*  CO2 27  GLUCOSE 96  BUN 16  CREATININE 0.82  CALCIUM 9.1    Intake/Output Summary (Last 24 hours) at 12/30/2020 0850 Last data filed at 12/30/2020 0825 Gross per 24 hour  Intake 358 ml  Output 550 ml  Net -192 ml        Physical Exam: Vital Signs Blood pressure 125/71, pulse 84, temperature 98.4 F (36.9 C), temperature source Oral, resp. rate 18, height 5' 7" (1.702 m), weight 75.7 kg, SpO2 97 %.      General: awake, alert, appropriate, sitting up a little in bed- higher than normal; NAD HENT: conjugate gaze; oropharynx moist CV: regular rate; no JVD Pulmonary: CTA B/L; no W/R/R- good air movement GI: soft, NT, ND, (+)BS- hypoactive slightly- slightly protuberant vs distension? Psychiatric: tries to avoid discussing medical issues, but better than before.  Neurological: alert- very HOH- made him repeat what he heard.   Musculoskeletal: PICC in LUE still    Comments: LUE-strength in his Left arm is 4-/5-in the deltoid, bicep, tricep, wrist extension, grip, and finger abduction RUE-deltoid bicep and tricep are 3/5, wrist extension and grip are 3-/5, LLE-4/5 in hip flexion, knee extension, and dorsiflexion as well as plantarflexion RLE-strength is 4-/5-in the same muscles TTP over L shoulder and L upper trap areas Skin:    General: Skin is warm and dry.     Comments:  PICC line in his left upper extremity still there  Neurological:  He reports his light touch is intact in all 4 extremities He has no lower extremity clonus bilaterally  Assessment/Plan: 1. Functional deficits which require 3+ hours per day of interdisciplinary therapy in a  comprehensive inpatient rehab setting.  Physiatrist is providing close team supervision and 24 hour management of active medical problems listed below.  Physiatrist and rehab team continue to assess barriers to discharge/monitor patient progress toward functional and medical goals  Care Tool:  Bathing  Bathing activity did not occur: Safety/medical concerns (Per OT eval on medical hold) Body parts bathed by patient: Chest,Abdomen,Face,Left arm   Body parts bathed by helper: Right arm,Buttocks,Right lower leg,Left lower leg     Bathing assist Assist Level: Moderate Assistance - Patient 50 - 74%     Upper Body Dressing/Undressing Upper body dressing   What is the patient wearing?: Pull over shirt    Upper body assist Assist Level: Maximal Assistance - Patient 25 - 49%    Lower Body Dressing/Undressing Lower body dressing      What is the patient wearing?: Pants     Lower body assist Assist for lower body dressing: Maximal Assistance - Patient 25 - 49%     Toileting Toileting    Toileting assist Assist for toileting: Dependent - Patient 0% (per staff  report, declined at  eval. also limited by pain) Assistive Device Comment: foley   Transfers Chair/bed transfer  Transfers assist  Chair/bed transfer activity did not occur: Refused  Chair/bed transfer assist level: Dependent - Patient 0% (lateral slide)     Locomotion Ambulation   Ambulation assist   Ambulation activity did not occur: Safety/medical concerns          Walk 10 feet activity   Assist  Walk 10 feet activity did not occur: Safety/medical concerns        Walk 50 feet activity   Assist Walk 50 feet with 2 turns activity did not occur: Safety/medical concerns         Walk 150 feet activity   Assist Walk 150 feet activity did not occur: Safety/medical concerns         Walk 10 feet on uneven surface  activity   Assist Walk 10 feet on uneven surfaces activity did not occur:  Safety/medical concerns         Wheelchair     Assist Will patient use wheelchair at discharge?: Yes Type of Wheelchair: Manual Wheelchair activity did not occur: Refused  Wheelchair assist level: Dependent - Patient 0%      Wheelchair 50 feet with 2 turns activity    Assist    Wheelchair 50 feet with 2 turns activity did not occur: Refused   Assist Level: Dependent - Patient 0%   Wheelchair 150 feet activity     Assist  Wheelchair 150 feet activity did not occur: Refused   Assist Level: Dependent - Patient 0%   Blood pressure 125/71, pulse 84, temperature 98.4 F (36.9 C), temperature source Oral, resp. rate 18, height 5' 7" (1.702 m), weight 75.7 kg, SpO2 97 %.  Medical Problem List and Plan: 1.  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  2.  Acute DVT in peroneal/soleus: eliquis restarted 3. Pain Management: Patient is already receiving oxycodone as needed; we started tramadol scheduled but that is not working; will DC tramadol and schedule MS Contin 15 mg twice daily  - on Valium and Firoicet prn per NSU  D/c cymbalta which may be causing emesis.   Continue MS Contin as pain and ability to participate in therapy limited by pain. XR cervical spine stable, soft collar ordered.   4/27- pain sounds like down to 5/10 after d/w pt what he means- it's not 8/10 based on description, con't regimen  4/29- pt working better with therapy- slowly- and pain is "slightly better"- so guessing around 4/10 at rest  5/1: requested kpad be given for his upper back pain, continue  5/2- like lidoderm and kpad- cannot use together.   5/3- will wean MS Contin to 15 mg qAM but don't feel  comfortable stopping it altogether by d/c.   5/4- tolerated wean- don't feel comfortable doing less/stopping MS Contin- but will wean him slowly.  4. Mood: - Messaged SW regarding adding Mr. Wildeman to Neuropsychologyschedule due to uncontrolled pain and new SCI.   4/27- will add Buspar 5 mg TID and change as required  5/2- less anxious- doing better with Buspar- con't regimen             -antipsychotic agents: N/A 5. Neuropsych: This patient is capable of making decisions on his own behalf. 6. Skin/Wound Care: We will change his honeycomb dressing on his neck to daily and as needed; will also change the dressing on his right forearm to just a dry dressing and change as needed. Less drainage today, will not require I&D  4/23 minimal drainage, almost healed incision- continue follow/monitor  5/4- just has a scab- healed over otherwise- con't to monitor 7. Fluids/Electrolytes/Nutrition: We will check labs on Monday 8. Uncontrolled Diabetes II- A1c of 13.9-continue patient's Levemir 10 units nightly and sliding scale insulin  4/23- CBGs bordereline. increase Levemir to 18 units nightly.    5/3- BGs 103-129- doing great- con't regimen 9. Hypertension hx with Orthostatic hypotension-patient is on HCTZ 25 mg daily as well as Norvasc 10 mg daily and lisinopril 40 mg daily and Lopressor 50 mg twice daily-blood pressure is well controlled we will continue these medications and monitor especially with increased activity  4/15: Have reduced Lisinopril, HCTZ, Norvasc and con't Lopressor- still having episode of orthostatic hypotension- might need additional reduction- don't decreased B Blocker due to tachycardia frequently  4/29- BP controlled- no Orthostasis- con't regimen  4/30: SBP soft to 70s, decrease norvasc to 2.80m Vitals:   12/29/20 2012 12/30/20 0421  BP: 118/67 125/71  Pulse: 73 84  Resp: 16 18  Temp: 98.6 F (37 C) 98.4 F (36.9 C)  SpO2: 95% 97%    5/3- BP doing better- 110s-120s over  70s-80s- con't regimen 10.  Neurogenic bowel-we will look to start him on a bowel program nightly after dinner with Dulcolax suppository  4/15- KUB showed diffuse constipation- will give Sorbitol 60 ml to clean him out  4/25- full of stool AGAIN- even though on bowel program and on bowel meds- not sure if refusing?- Has diffuse ileus and severe constipation-will give Sorbitol again 60 ml= put on liquid diet- and increase bowel meds  4/26- pt thought was "OK" to refuse bowel meds and bowel program- explained is so ful of stool, needs 10+ BMs to clean out- ordered Mg citrate- and f/u KUB  4/27- KUB not much better- but will con't liquid diet- doesn't want NG tube- to finish Mg citrate today- d/w pt and family.   4/28- will recheck KUB today and also ordered Mg citrate again and Fleets enema at 3pm- pt said IF KUB worse, willing to do NGT, but only if worse  4/29- moderate stool on new KUB- ileus resolved- and had 3 BMs since KUB done- will switch back to regular diet and con't bowel program  5/3- will recheck KKUB since had N/V last afternoon, just in case-   5/4- had questionable forming bowel obstruction- recheck abd series- looked better- "resolving ileus"- will not place NGT, but advised nursing to not let pt refuse bowel meds or bowel program like has been doing- on Senna 2 tabs BID and Miralax BID as well.  12.  Acute DVT in the soleus and peroneal-we will change his Lovenox to apixaban/Eliquis 10 mg BID x 1 week, then 5 mg BID.  13.  Neurogenic bladder-we will continue his Foley for now and start Flomax tomorrow 0.4 mg q. supper and see if he is able to start to void;  we might need to titrate this as required- will try and remove foley next week.   4/27- will start  Chronic foley- since pt doesn't want family cathing him.  5/3- since just started last week, will not change before d/c- but will need H/H nursing or Urology to change.    14. Hypokalemia: supplement 110m BID today and repeat BMP  tomorrow  4/8- K+ 3.5- borderline low- will recheck Monday  4/11- K+ 3.3 will replete 40 mEq x2 and recheck Thursday   5/2- K+ down to 3.3- had been holding his own- will replete today 40 mEq x1  5/3- will recheck BMP in AM  5/4- results pending-  15. Epidural abscess-  4/5- on  Cefazolin IV for prolonged IV ABX- pt's WBC down to 15.5 (was 14k yesterday, but 20.8 3 days ago)- low grade temps up to 100.2 in last 24 hours-will con't regimen   4/6-White count is down slightly to 14K-we will continue IV antibiotics and check CRP and ESR-weekly  4/7- afebrile- will recheck labs in AM  4/8- WBC stable- con't IV ABX.   4/11- WBC down to 11.3- ESR >140 and CRP 10.9- heading a little better- con't to monitor.   4/15 WBC 9.8, monitor weekly  5/2- WBC 10.4-  5/3- CRP down to 1.4 and ESR down to 82-  Need to call ID to see what plan is since about to finish IV ABX.   5/4- will d/c PICC_ has been switched to PO ABX- will check all labs tomorrow just to make sure doing OK.  16. Dysphagia?  D2 diet due to lack of teeth, not due to dysphagia- doesn't need pills crushed. 17. Vomiting: d/c cymbalta which may be contributing. IV zofran. KUB ordered and shows no abdominal pathology.   -4/23 improved off iron, reglan dc'ed  4/25- still nauseated- due to ileus  4/26- on liquid diet- con't regimen  4/27- ileus not much better- conn't liquid diet/Mg citrate-   4/28- as above- only NGT if KUB worse- pt agreed to this compromise- added another dose mg citrate and fleets enema.    4/29- ileus resolved 18. Atelectasis: Recommend incentive spirometer to improve breathing strength and ability over time. Provided incentive spirometer education. 19. Lethargy: decreased valium to 167mBID.    LOS: 32 days A FACE TO FACE EVALUATION WAS PERFORMED  Onna Nodal 12/30/2020, 8:50 AM
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
## 2020-12-30 NOTE — Progress Notes (Signed)
Occupational Therapy Session Note  Patient Details  Name: Daniel Cline MRN: [id number] Date of Birth: [birth year]  Today's Date: 12/30/2020 OT Individual Time: 0900-0930 OT Individual Time Calculation (min): 30 min    Short Term Goals: Week 4:  OT Short Term Goal 1 (Week 4): STGs = LTGs d/t ELOS  Skilled Therapeutic Interventions/Progress Updates:    Pt resting in bed upon arrival. Pt stated he had finished with a BM. Initial focus on bed mobility rolling R/L using bed rails-min A. Total A for hygiene and clothing management at bed level. Supine>sit EOB with min A. Pt c/o dizziness sitting EOB for 30 seconds and returned to supine. BO 122/75. Pt stated symptoms were resolving. Pt remained in bed with all needs within reach.  Therapy Documentation Precautions:  Precautions Precautions: Cervical,Fall Precaution Booklet Issued: No Required Braces or Orthoses: Cervical Brace Cervical Brace: Soft collar,For comfort Restrictions Weight Bearing Restrictions: No   Pain:  Pt c/o Lt shoulder pain; repositioned     Therapy/Group: Individual Therapy  Rich Brave 12/30/2020, 12:07 PM

## 2020-12-31 ENCOUNTER — Other Ambulatory Visit (HOSPITAL_COMMUNITY): Payer: Self-pay

## 2020-12-31 LAB — GLUCOSE, CAPILLARY
Glucose-Capillary: 105 mg/dL — ABNORMAL HIGH (ref 70–99)
Glucose-Capillary: 211 mg/dL — ABNORMAL HIGH (ref 70–99)
Glucose-Capillary: 131 mg/dL — ABNORMAL HIGH (ref 70–99)
Glucose-Capillary: 138 mg/dL — ABNORMAL HIGH (ref 70–99)

## 2020-12-31 LAB — CBC WITH DIFFERENTIAL/PLATELET
Abs Immature Granulocytes: 0.02 10*3/uL (ref 0.00–0.07)
Basophils Absolute: 0.1 10*3/uL (ref 0.0–0.1)
Basophils Relative: 1 %
Eosinophils Absolute: 0.9 10*3/uL — ABNORMAL HIGH (ref 0.0–0.5)
Eosinophils Relative: 10 %
HCT: 36.6 % — ABNORMAL LOW (ref 39.0–52.0)
Hemoglobin: 12 g/dL — ABNORMAL LOW (ref 13.0–17.0)
Immature Granulocytes: 0 %
Lymphocytes Relative: 19 %
Lymphs Abs: 1.8 10*3/uL (ref 0.7–4.0)
MCH: 30.8 pg (ref 26.0–34.0)
MCHC: 32.8 g/dL (ref 30.0–36.0)
MCV: 93.8 fL (ref 80.0–100.0)
Monocytes Absolute: 1.2 10*3/uL — ABNORMAL HIGH (ref 0.1–1.0)
Monocytes Relative: 12 %
Neutro Abs: 5.6 10*3/uL (ref 1.7–7.7)
Neutrophils Relative %: 58 %
Platelets: 346 10*3/uL (ref 150–400)
RBC: 3.9 MIL/uL — ABNORMAL LOW (ref 4.22–5.81)
RDW: 14.3 % (ref 11.5–15.5)
WBC: 9.7 10*3/uL (ref 4.0–10.5)
nRBC: 0 % (ref 0.0–0.2)

## 2020-12-31 LAB — COMPREHENSIVE METABOLIC PANEL
ALT: 5 U/L (ref 0–44)
AST: 17 U/L (ref 15–41)
Albumin: 2.9 g/dL — ABNORMAL LOW (ref 3.5–5.0)
Alkaline Phosphatase: 96 U/L (ref 38–126)
Anion gap: 7 (ref 5–15)
BUN: 17 mg/dL (ref 8–23)
CO2: 25 mmol/L (ref 22–32)
Calcium: 9.2 mg/dL (ref 8.9–10.3)
Chloride: 104 mmol/L (ref 98–111)
Creatinine, Ser: 0.81 mg/dL (ref 0.61–1.24)
GFR, Estimated: >60 mL/min (ref >60)
Glucose, Bld: 111 mg/dL — ABNORMAL HIGH (ref 70–99)
Potassium: 3.2 mmol/L — ABNORMAL LOW (ref 3.5–5.1)
Sodium: 136 mmol/L (ref 135–145)
Total Bilirubin: 0.5 mg/dL (ref 0.3–1.2)
Total Protein: 6.9 g/dL (ref 6.5–8.1)

## 2020-12-31 LAB — SEDIMENTATION RATE: Sed Rate: 70 mm/hr — ABNORMAL HIGH (ref 0–16)

## 2020-12-31 LAB — C-REACTIVE PROTEIN: CRP: 1.4 mg/dL — ABNORMAL HIGH (ref <1.0)

## 2020-12-31 MED ORDER — INSULIN DETEMIR 100 UNIT/ML FLEXPEN
18.0000 [IU] | PEN_INJECTOR | Freq: Every day | SUBCUTANEOUS | 11 refills | Status: AC
  Filled 2020-12-31: qty 6, 30d supply, fill #0

## 2020-12-31 MED ORDER — SENNOSIDES-DOCUSATE SODIUM 8.6-50 MG PO TABS: 2.0000 | ORAL_TABLET | Freq: Two times a day (BID) | ORAL | Status: AC

## 2020-12-31 MED ORDER — LIDOCAINE 5 % EX PTCH
3.0000 | MEDICATED_PATCH | CUTANEOUS | 0 refills | Status: AC
  Filled 2020-12-31: qty 60, 20d supply, fill #0

## 2020-12-31 MED ORDER — TAMSULOSIN HCL 0.4 MG PO CAPS
0.8000 mg | ORAL_CAPSULE | Freq: Every day | ORAL | 0 refills | Status: AC
  Filled 2020-12-31: qty 60, 30d supply, fill #0

## 2020-12-31 MED ORDER — HYDROCHLOROTHIAZIDE 12.5 MG PO TABS
12.5000 mg | ORAL_TABLET | Freq: Every day | ORAL | 0 refills | Status: DC
  Filled 2020-12-31: qty 30, 30d supply, fill #0

## 2020-12-31 MED ORDER — OXYCODONE HCL 5 MG PO TABS
5.0000 mg | ORAL_TABLET | ORAL | 0 refills | Status: DC | PRN
  Filled 2020-12-31: qty 30, 3d supply, fill #0

## 2020-12-31 MED ORDER — POLYETHYLENE GLYCOL 3350 17 G PO PACK: 17.0000 g | PACK | Freq: Two times a day (BID) | ORAL | 0 refills | Status: AC

## 2020-12-31 MED ORDER — AMLODIPINE BESYLATE 2.5 MG PO TABS
2.5000 mg | ORAL_TABLET | Freq: Every day | ORAL | 0 refills | Status: DC
  Filled 2020-12-31: qty 30, 30d supply, fill #0

## 2020-12-31 MED ORDER — POTASSIUM CHLORIDE CRYS ER 10 MEQ PO TBCR: 10.0000 meq | EXTENDED_RELEASE_TABLET | Freq: Every day | ORAL | Status: DC

## 2020-12-31 MED ORDER — DIAZEPAM 2 MG PO TABS
1.0000 mg | ORAL_TABLET | Freq: Two times a day (BID) | ORAL | 0 refills | Status: DC
  Filled 2020-12-31: qty 30, 30d supply, fill #0

## 2020-12-31 MED ORDER — ADULT MULTIVITAMIN W/MINERALS CH: 1.0000 | ORAL_TABLET | Freq: Every day | ORAL | Status: AC

## 2020-12-31 MED ORDER — POTASSIUM CHLORIDE CRYS ER 10 MEQ PO TBCR
10.0000 meq | EXTENDED_RELEASE_TABLET | Freq: Every day | ORAL | Status: DC
  Administered 2021-01-01: 10 meq via ORAL
  Filled 2020-12-31: qty 1

## 2020-12-31 MED ORDER — LISINOPRIL 5 MG PO TABS
5.0000 mg | ORAL_TABLET | Freq: Every day | ORAL | 0 refills | Status: AC
  Filled 2020-12-31: qty 30, 30d supply, fill #0

## 2020-12-31 MED ORDER — APIXABAN 5 MG PO TABS
5.0000 mg | ORAL_TABLET | Freq: Two times a day (BID) | ORAL | 0 refills | Status: DC
  Filled 2020-12-31: qty 60, 30d supply, fill #0

## 2020-12-31 MED ORDER — POTASSIUM CHLORIDE CRYS ER 20 MEQ PO TBCR
40.0000 meq | EXTENDED_RELEASE_TABLET | Freq: Two times a day (BID) | ORAL | Status: AC
  Administered 2020-12-31 (×2): 40 meq via ORAL
  Filled 2020-12-31 (×2): qty 2

## 2020-12-31 MED ORDER — POTASSIUM CHLORIDE CRYS ER 10 MEQ PO TBCR
10.0000 meq | EXTENDED_RELEASE_TABLET | Freq: Every day | ORAL | 0 refills | Status: DC
  Filled 2020-12-31: qty 30, 30d supply, fill #0

## 2020-12-31 MED ORDER — MORPHINE SULFATE ER 15 MG PO TBCR
15.0000 mg | EXTENDED_RELEASE_TABLET | Freq: Every day | ORAL | 0 refills | Status: DC
  Filled 2020-12-31: qty 7, 7d supply, fill #0

## 2020-12-31 MED ORDER — PROMETHAZINE HCL 6.25 MG/5ML PO SYRP
6.2500 mg | ORAL_SOLUTION | Freq: Four times a day (QID) | ORAL | 1 refills | Status: DC | PRN
  Filled 2020-12-31: qty 120, 6d supply, fill #0

## 2020-12-31 MED ORDER — BUSPIRONE HCL 10 MG PO TABS
5.0000 mg | ORAL_TABLET | Freq: Three times a day (TID) | ORAL | 0 refills | Status: DC
  Filled 2020-12-31: qty 45, 30d supply, fill #0

## 2020-12-31 NOTE — Progress Notes (Signed)
Son and D-N-L in room, discussed foley care including flushing catheter. D-N-L understands and also administered insulin to pt without difficulty. Discussed pen use if goes home with pen. Pt finishing eating and will administer bowel program.

## 2020-12-31 NOTE — Progress Notes (Signed)
Physical Therapy Session Note  Patient Details  Name: Daniel Cline MRN: [id number] Date of Birth: May 17, 1956  Today's Date: 12/31/2020 PT Individual Time: 0800-0845 PT Individual Time Calculation (min): 45 min   Short Term Goals: Week 1:  PT Short Term Goal 1 (Week 1): Pt will tolerate sitting in appropriate wc x 1 hr to allow for participation in therapy session PT Short Term Goal 1 - Progress (Week 1): Met PT Short Term Goal 2 (Week 1): Pt will roll L/R w/mod assist and cues PT Short Term Goal 2 - Progress (Week 1): Met PT Short Term Goal 3 (Week 1): Pt will tolerate sitting on edge of bed/mat x 10 min w/occasional repositioning if needed and mod assist PT Short Term Goal 3 - Progress (Week 1): Not met Week 2:  PT Short Term Goal 1 (Week 2): pt to demonstrate upright sitting position at least ~75 degrees to increase safety with transfers and sitting tolerance PT Short Term Goal 1 - Progress (Week 2): Not met PT Short Term Goal 2 (Week 2): pt to demonstrate supine>sit mod A x1 PT Short Term Goal 2 - Progress (Week 2): Not met PT Short Term Goal 3 (Week 2): Pt will tolerate sitting on edge of bed/mat x 10 min w/occasional repositioning if needed and mod assist PT Short Term Goal 3 - Progress (Week 2): Not met Week 3:  PT Short Term Goal 1 (Week 3): pt to demonstrate upright sitting position at least ~75 degrees to increase safety with transfers and sitting tolerance PT Short Term Goal 1 - Progress (Week 3): Met PT Short Term Goal 2 (Week 3): pt to demonstrate supine>sit mod A x1 PT Short Term Goal 2 - Progress (Week 3): Not met PT Short Term Goal 3 (Week 3): Pt will tolerate sitting on edge of bed/mat x 10 min w/occasional repositioning if needed and mod assist PT Short Term Goal 3 - Progress (Week 3): Revised due to lack of progress Week 4:  PT Short Term Goal 1 (Week 4): pt to demonstrate supine>sit mod A x1 PT Short Term Goal 2 (Week 4): pt to demonstrate functional transfer at  max A x1 for safety with DC home PT Short Term Goal 3 (Week 4): Pt will tolerate sitting on edge of bed/mat x 5 min w/occasional repositioning if needed and mod assist  Skilled Therapeutic Interventions/Progress Updates:    pt received in bed and agreeable to therapy. Pt directed in donning B socks, TED hose, and shorts in bed with total A for socks and hose and max A for shorts, CGA for pulling pants over hips with rolling in bed with use of bedrail. Pt directed in supine>sit mod A for trunk support. Pt directed in squat pivot to WC, max A with additional person to support chair in place. Pt then required max A for repositioning in [facility] for safety. Pt left in WC, All needs in reach and in good condition. Call light in hand.  And alarm set with belt.  PT returned after 45 mins to assist nursing with pt transfer back to bed with squat pivot max A for knee blocking and VC for technique with pt able to participate and assist in transfer, mod A sit>supine. Pt left with nursing at this point.   Therapy Documentation Precautions:  Precautions Precautions: Cervical,Fall Precaution Booklet Issued: No Required Braces or Orthoses: Cervical Brace Cervical Brace: Soft collar,For comfort (pt refused soft collar when ortho brought to room, MD aware) Restrictions Weight  Bearing Restrictions: No General:   Vital Signs:   Pain: Pain Assessment Pain Scale: 0-10 Pain Score: 6  Pain Type: Surgical pain Pain Location: Neck Pain Orientation: Mid Pain Descriptors / Indicators: Aching Pain Onset: With Activity Pain Intervention(s): RN made aware;Rest;Relaxation;Repositioned Mobility: Bed Mobility Bed Mobility: Rolling Right;Rolling Left;Supine to Sit;Sit to Supine;Scooting to [facility];Sitting - Scoot to Edge of Bed Rolling Right: CGA - Patient > 75% Rolling Left:CGA - Patient > 75% Supine to Sit: Maximal Assistance - Patient - Patient 25-49% Sitting - Scoot to Edge of Bed: Minimal Assistance - Patient >  75% Sit to Supine: Moderate Assistance - Patient 50-74% Scooting to HOB: Maximal Assistance - Patient 25-49% Transfers Transfers: Pharmacist, hospital Pivot Transfers: Maximal Assistance - Patient 25-49% Transfer (Assistive device): 1 person hand held assist Locomotion : Gait Ambulation: No Gait Gait: No Stairs / Additional Locomotion Stairs: No Wheelchair Mobility Wheelchair Mobility: No (pt would benefit from [facility] mobility however has refused)  Trunk/Postural Assessment : Cervical Assessment Cervical Assessment: Exceptions to [facility] (cervical precautions) Thoracic Assessment Thoracic Assessment: Exceptions to [facility] (posterior lean) Lumbar Assessment Lumbar Assessment:  (posterior peliv tilt) Postural Control Postural Control:  (extensor reactions noted with fear of falling and pain)  Balance: Balance Balance Assessed: Yes Static Sitting Balance Static Sitting - Balance Support: Feet supported;Bilateral upper extremity supported Static Sitting - Level of Assistance: 4: Min assist Dynamic Sitting Balance Dynamic Sitting - Balance Support: Feet supported;Bilateral upper extremity supported Dynamic Sitting - Level of Assistance: 3: Mod assist Exercises:   Other Treatments:      Therapy/Group: Individual Therapy  [name] 12/31/2020, 12:18 PM

## 2020-12-31 NOTE — Progress Notes (Signed)
Patient ID: Daniel Cline, male   DOB: 09/26/1955, 65 y.o.   MRN: [id number]  Met with heather-daughter in-law to discuss plan for tomorrow she plans to come in am to go over Dc instructions with PA and then follow ambulance or go home and wait for. hospital bed to be delivered today to home. Pt doing better today and did a stand pivot transfer with PT and family. PTAR to be scheduled for 10:00 tomorrow for pick up

## 2020-12-31 NOTE — Progress Notes (Signed)
Physical Therapy Discharge Summary  Patient Details  Name: Daniel Cline MRN: [id number] Date of Birth: 09/09/1955  Today's Date: 12/31/2020 PT Individual Time: 0800-0845 PT Individual Time Calculation (min): 45 min    Patient has met 3 of 5 long term goals due to improved activity tolerance, improved postural control, increased strength, decreased pain, functional use of  right lower extremity and left lower extremity, improved attention and improved awareness.  Patient to discharge at a wheelchair level Max Assist.   Patient's care partner is independent to provide the necessary physical assistance at discharge.  Reasons goals not met: pt has demonstrated poor activity tolerance, consistently high pain levels which has limited his overall progress with therapy. Pt has progress  Recommendation:  Patient will benefit from ongoing skilled PT services in home health setting to continue to advance safe functional mobility, address ongoing impairments in sitting balance, bed mobility, transfers, standing balance, gait, WC mobility, and minimize fall risk.  Equipment: hospital bed, pt's family privately buying manual WC  Reasons for discharge: lack of progress toward goals and discharge from hospital  Patient/family agrees with progress made and goals achieved: Yes  PT Discharge Precautions/Restrictions Precautions Precautions: Cervical;Fall Cervical Brace: Soft collar;For comfort (pt refused soft collar when ortho brought to room, MD aware) Restrictions Weight Bearing Restrictions: No Vital Signs   Pain Pain Assessment Pain Scale: 0-10 Pain Score: 6  Pain Type: Surgical pain Pain Location: Neck Pain Orientation: Mid Pain Descriptors / Indicators: Aching Pain Onset: With Activity Pain Intervention(s): RN made aware;Rest;Relaxation;Repositioned Vision/Perception     Cognition Overall Cognitive Status: Within Functional Limits for tasks assessed Arousal/Alertness:  Awake/alert Orientation Level: Oriented X4 Attention: Focused;Sustained Focused Attention: Appears intact Sustained Attention: Appears intact Awareness: Appears intact Problem Solving: Appears intact Safety/Judgment: Appears intact Sensation Sensation Light Touch: Impaired by gross assessment Proprioception: Impaired by gross assessment (B toes per pt) Coordination Gross Motor Movements are Fluid and Coordinated: No Fine Motor Movements are Fluid and Coordinated: No Coordination and Movement Description: very limited by pain, proximal weakness, edema and arthritic changes in hands, weakness bilat hands, very limited at eval Motor  Motor Motor: Tetraplegia;Other (comment) Motor - Skilled Clinical Observations: quadraparesis, pain in neck/shoulders, limited AROM with ROM in R>L  Mobility Bed Mobility Bed Mobility: Rolling Right;Rolling Left;Supine to Sit;Sit to Supine;Scooting to [facility];Sitting - Scoot to [employer] of Bed Rolling Right: Minimal Assistance - Patient > 75% Rolling Left: Minimal Assistance - Patient > 75% Supine to Sit: Maximal Assistance - Patient - Patient 25-49% Sitting - Scoot to Edge of Bed: Minimal Assistance - Patient > 75% Sit to Supine: Moderate Assistance - Patient 50-74% Scooting to HOB: Maximal Assistance - Patient 25-49% Transfers Transfers: Pharmacist, hospital Pivot Transfers: Maximal Assistance - Patient 25-49% Transfer (Assistive device): 1 person hand held assist Locomotion  Gait Ambulation: No Gait Gait: No Stairs / Additional Locomotion Stairs: No Wheelchair Mobility Wheelchair Mobility: No (pt would benefit from [facility] mobility however has refused)  Trunk/Postural Assessment  Cervical Assessment Cervical Assessment: Exceptions to [facility] (cervical precautions) Thoracic Assessment Thoracic Assessment: Exceptions to [facility] (posterior lean) Lumbar Assessment Lumbar Assessment:  (posterior peliv tilt) Postural Control Postural Control:  (extensor  reactions noted with fear of falling and pain)  Balance Balance Balance Assessed: Yes Static Sitting Balance Static Sitting - Balance Support: Feet supported;Bilateral upper extremity supported Static Sitting - Level of Assistance: 4: Min assist Dynamic Sitting Balance Dynamic Sitting - Balance Support: Feet supported;Bilateral upper extremity supported Dynamic Sitting - Level of  Assistance: 3: Mod assist Extremity Assessment      RLE Assessment RLE Assessment: Exceptions to [facility] General Strength Comments: grossly at least 3+/5 LLE Assessment LLE Assessment: Exceptions to [facility] General Strength Comments: grossly at least 3+/5    [name] 12/31/2020, 9:20 AM

## 2020-12-31 NOTE — Progress Notes (Signed)
Pt eating fried foods, educated family on foods and verify that has Living with Diabetes at home. Suggested to look through it. Suggested small meals every 3 hours with proteins and carbs together, heart friendly foods with low sugars, fresh fruits and veggies.  D-N-L administered first part of bowel program. Pt tolerated well.  Rectal vault clear after dig stem, suppository administered. No results at this time. Will have 2nd shift follow up with dig stem.

## 2020-12-31 NOTE — Progress Notes (Signed)
                                                       PROGRESS NOTE   Subjective/Complaints:  Had multiple BMs yesterday- feels better- no nausea.  Ate 1 piece pizza last night and fell asleep.  Feeling good- d/w nursing don't allow pt to refuse bowel program.  ID wants pt ton PO ABX x 4 more works and f/u with ID.   ROS:   Pt denies SOB, abd pain, CP, N/V/C/D, and vision changes   Objective:   DG Abd 1 View  Result Date: 12/29/2020 CLINICAL DATA:  Multiple vomiting episodes. EXAM: ABDOMEN - 1 VIEW COMPARISON:  12/24/2020. FINDINGS: Soft tissue structures are unremarkable. Several distended loops of small bowel noted. Stool noted throughout the colon. Colon is nondistended. No free air. Degenerative change lumbar spine. Radiopacities noted both buttocks. Vascular calcification of the pelvis. IMPRESSION: Several distended loops of small bowel noted. Stool noted throughout colon. Colon is nondistended. Although adynamic ileus could present in this fashion, follow-up abdominal series suggested to exclude developing small bowel obstruction. Electronically Signed   By: Thomas  Register   On: 12/29/2020 11:06   MR CERVICAL SPINE WO CONTRAST  Result Date: 12/29/2020 CLINICAL DATA:  Postsurgical follow-up after laminectomy. EXAM: MRI CERVICAL SPINE WITHOUT CONTRAST TECHNIQUE: Multiplanar, multisequence MR imaging of the cervical spine was performed. No intravenous contrast was administered. COMPARISON:  11/18/2020 FINDINGS: Alignment: Reversal of normal cervical lordosis may be positional or due to muscle spasm. Vertebrae: Posterior decompression with bilateral laminectomies at C3-7 Cord: Normal signal and morphology. Posterior Fossa, vertebral arteries, paraspinal tissues: Fluid collection in the posterior soft tissues at the C5-7 levels measures 4.0 x 2.4 x 3.1 cm. Disc levels: C2-3: Improved patency of the spinal canal. C3-4: Posterior decompression with improved patency of the spinal canal.  C4-5: Posterior decompression. Unchanged left subarticular disc osteophyte complex indenting the ventral spinal cord. Spinal canal patency is improved. C5-6: Left subarticular disc osteophyte complex contacting the ventral spinal cord. Posterior decompression. C6-7: Posterior decompression.  No spinal canal stenosis. IMPRESSION: 1. Posterior decompression from C3-7 with improved patency of the spinal canal at multiple levels. 2. Unchanged ventral spinal cord contact by left subarticular disc-osteophyte complexes at C4-6. 3. Fluid collection in the posterior soft tissues at the C5-7 levels, measuring up to 4.0 x 2.4 x 3.1 cm, likely seroma. Electronically Signed   By: Kevin  Herman M.D.   On: 12/29/2020 23:54   DG ABD ACUTE 2+V W 1V CHEST  Result Date: 12/29/2020 CLINICAL DATA:  Nausea EXAM: DG ABDOMEN ACUTE WITH 1 VIEW CHEST COMPARISON:  12/29/2020 at 9:16 a.m. FINDINGS: Supine and upright frontal views of the abdomen as well as an upright frontal view of the chest are obtained. Cardiac silhouette is unremarkable. Left-sided PICC line is again noted, tip projecting over the right brachiocephalic confluence. Please correlate with catheter function. Lung volumes are diminished, with hypoventilatory changes at the lung bases. No effusion or pneumothorax. The distended gas-filled loops of small bowel within the left mid abdomen on previous study of decreasing caliber on this exam. Gas and stool are seen throughout the colon to the level of the rectum. No evidence of high-grade obstruction. No masses or abnormal calcifications. No free gas in the greater peritoneal sac. IMPRESSION: 1. Improving gaseous distention of   the small bowel in the left upper quadrant, consistent with resolving ileus. 2. Interval change in position of the left-sided PICC, catheter tip now projecting over the brachiocephalic confluence on the right. Please correlate with catheter function. 3. Hypoventilatory changes at the lung bases.  Electronically Signed   By: Michael  Brown M.D.   On: 12/29/2020 21:20   Recent Labs    12/31/20 0548  WBC 9.7  HGB 12.0*  HCT 36.6*  PLT 346   Recent Labs    12/30/20 1026 12/31/20 0548  NA 134* 136  K 3.3* 3.2*  CL 101 104  CO2 25 25  GLUCOSE 146* 111*  BUN 13 17  CREATININE 0.79 0.81  CALCIUM 8.9 9.2    Intake/Output Summary (Last 24 hours) at 12/31/2020 0917 Last data filed at 12/31/2020 0505 Gross per 24 hour  Intake 358 ml  Output 250 ml  Net 108 ml        Physical Exam: Vital Signs Blood pressure 117/70, pulse 74, temperature 98 F (36.7 C), resp. rate 18, height 5' 7" (1.702 m), weight 75.7 kg, SpO2 98 %.       General: awake, alert, appropriate, laying supine in bed; nurse in room; NAD HENT: conjugate gaze; oropharynx moist CV: regular rate; no JVD Pulmonary: CTA B/L; no W/R/R- good air movement GI: soft, NT, ND, (+)BS Psychiatric: appropriate Neurological: alert- HOH- was able to repeat back to me the plan.   Skin: neck has small scab- otherwise looked almost completely healed.  Musculoskeletal: PICC in LUE still    Comments: LUE-strength in his Left arm is 4-/5-in the deltoid, bicep, tricep, wrist extension, grip, and finger abduction RUE-deltoid bicep and tricep are 3/5, wrist extension and grip are 3-/5, LLE-4/5 in hip flexion, knee extension, and dorsiflexion as well as plantarflexion RLE-strength is 4-/5-in the same muscles TTP over L shoulder and L upper trap areas Neurological:  He reports his light touch is intact in all 4 extremities He has no lower extremity clonus bilaterally  Assessment/Plan: 1. Functional deficits which require 3+ hours per day of interdisciplinary therapy in a comprehensive inpatient rehab setting.  Physiatrist is providing close team supervision and 24 hour management of active medical problems listed below.  Physiatrist and rehab team continue to assess barriers to discharge/monitor patient progress toward  functional and medical goals  Care Tool:  Bathing  Bathing activity did not occur: Safety/medical concerns (Per OT eval on medical hold) Body parts bathed by patient: Chest,Abdomen,Face,Left arm   Body parts bathed by helper: Right arm,Buttocks,Right lower leg,Left lower leg     Bathing assist Assist Level: Moderate Assistance - Patient 50 - 74%     Upper Body Dressing/Undressing Upper body dressing   What is the patient wearing?: Pull over shirt    Upper body assist Assist Level: Maximal Assistance - Patient 25 - 49%    Lower Body Dressing/Undressing Lower body dressing      What is the patient wearing?: Pants     Lower body assist Assist for lower body dressing: Maximal Assistance - Patient 25 - 49%     Toileting Toileting    Toileting assist Assist for toileting: Dependent - Patient 0% (per staff  report, declined at eval. also limited by pain) Assistive Device Comment: foley   Transfers Chair/bed transfer  Transfers assist  Chair/bed transfer activity did not occur: Refused  Chair/bed transfer assist level: Dependent - Patient 0% (lateral slide)     Locomotion Ambulation   Ambulation assist     Ambulation activity did not occur: Safety/medical concerns          Walk 10 feet activity   Assist  Walk 10 feet activity did not occur: Safety/medical concerns        Walk 50 feet activity   Assist Walk 50 feet with 2 turns activity did not occur: Safety/medical concerns         Walk 150 feet activity   Assist Walk 150 feet activity did not occur: Safety/medical concerns         Walk 10 feet on uneven surface  activity   Assist Walk 10 feet on uneven surfaces activity did not occur: Safety/medical concerns         Wheelchair     Assist Will patient use wheelchair at discharge?: Yes Type of Wheelchair: Manual Wheelchair activity did not occur: Refused  Wheelchair assist level: Dependent - Patient 0%      Wheelchair 50  feet with 2 turns activity    Assist    Wheelchair 50 feet with 2 turns activity did not occur: Refused   Assist Level: Dependent - Patient 0%   Wheelchair 150 feet activity     Assist  Wheelchair 150 feet activity did not occur: Refused   Assist Level: Dependent - Patient 0%   Blood pressure 117/70, pulse 74, temperature 98 F (36.7 C), resp. rate 18, height 5' 7" (1.702 m), weight 75.7 kg, SpO2 98 %.  Medical Problem List and Plan: 1.  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  5/5 con't PT and OT- family training before d/c tomorrow  2.  Acute DVT in peroneal/soleus: eliquis restarted 3. Pain Management: Patient is already receiving oxycodone as needed; we started tramadol scheduled but that is not working; will DC tramadol and schedule MS Contin 15 mg twice daily  - on Valium and Firoicet prn per NSU  D/c cymbalta which may be causing emesis.   Continue MS Contin as pain and ability to participate in therapy limited by pain. XR cervical spine stable, soft collar ordered.   4/27- pain sounds like down to 5/10 after d/w pt what he means- it's not 8/10 based on description, con't regimen  4/29- pt working better with therapy- slowly- and pain is "slightly better"- so guessing around 4/10 at rest  5/1: requested kpad be given for his upper back pain, continue  5/2- like lidoderm and kpad- cannot use together.   5/3- will wean MS Contin to 15 mg qAM but don't feel comfortable stopping it altogether by d/c.   5/4- tolerated wean- don't feel comfortable doing less/stopping MS Contin- but will wean him slowly.   5/5- Will send home on MS Contin 1x/day and prn pain meds 4. Mood: -  Messaged SW regarding adding Mr. Thelen to Neuropsychologyschedule due to uncontrolled pain and new SCI.   4/27- will add Buspar 5 mg TID and change as required  5/2- less anxious- doing better with Buspar- con't regimen  5/5- working SO much better- con't regimen             -  antipsychotic agents: N/A 5. Neuropsych: This patient is capable of making decisions on his own behalf. 6. Skin/Wound Care: We will change his honeycomb dressing on his neck to daily and as needed; will also change the dressing on his right forearm to just a dry dressing and change as needed. Less drainage today, will not require I&D  4/23 minimal drainage, almost healed incision- continue follow/monitor  5/4- just has a scab- healed over otherwise- con't to monitor 7. Fluids/Electrolytes/Nutrition: We will check labs on Monday 8. Uncontrolled Diabetes II- A1c of 13.9-continue patient's Levemir 10 units nightly and sliding scale insulin  4/23- CBGs bordereline. increase Levemir to 18 units nightly.    5/3- BGs 103-129- doing great- con't regimen 9. Hypertension hx with Orthostatic hypotension-patient is on HCTZ 25 mg daily as well as Norvasc 10 mg daily and lisinopril 40 mg daily and Lopressor 50 mg twice daily-blood pressure is well controlled we will continue these medications and monitor especially with increased activity  4/15: Have reduced Lisinopril, HCTZ, Norvasc and con't Lopressor- still having episode of orthostatic hypotension- might need additional reduction- don't decreased B Blocker due to tachycardia frequently  4/29- BP controlled- no Orthostasis- con't regimen  4/30: SBP soft to 70s, decrease norvasc to 2.77m Vitals:   12/30/20 1925 12/31/20 0455  BP: 112/61 117/70  Pulse: 87 74  Resp: 18 18  Temp: 98.1 F (36.7 C) 98 F (36.7 C)  SpO2: 97% 98%    5/3- BP doing better- 110s-120s over 70s-80s- con't regimen 10.  Neurogenic bowel-we will look to start him on a bowel program nightly after dinner with  Dulcolax suppository  4/15- KUB showed diffuse constipation- will give Sorbitol 60 ml to clean him out  4/25- full of stool AGAIN- even though on bowel program and on bowel meds- not sure if refusing?- Has diffuse ileus and severe constipation-will give Sorbitol again 60 ml= put on liquid diet- and increase bowel meds  4/26- pt thought was "OK" to refuse bowel meds and bowel program- explained is so ful of stool, needs 10+ BMs to clean out- ordered Mg citrate- and f/u KUB  4/27- KUB not much better- but will con't liquid diet- doesn't want NG tube- to finish Mg citrate today- d/w pt and family.   4/28- will recheck KUB today and also ordered Mg citrate again and Fleets enema at 3pm- pt said IF KUB worse, willing to do NGT, but only if worse  4/29- moderate stool on new KUB- ileus resolved- and had 3 BMs since KUB done- will switch back to regular diet and con't bowel program  5/3- will recheck KKUB since had N/V last afternoon, just in case-   5/4- had questionable forming bowel obstruction- recheck abd series- looked better- "resolving ileus"- will not place NGT, but advised nursing to not let pt refuse bowel meds or bowel program like has been doing- on Senna 2 tabs BID and Miralax BID as well.   5/5- don't stop bowel program or meds- d/w daughter in law.  12.  Acute DVT in the soleus and peroneal-we will change his Lovenox to apixaban/Eliquis 10 mg BID x 1 week, then 5 mg BID.  13.  Neurogenic bladder-we will continue his Foley for now and start Flomax tomorrow 0.4 mg q. supper and see if he is able to start to void;  we might need to titrate this as required- will try and remove foley next week.   4/27- will start Chronic foley- since pt doesn't want family cathing  him.  5/3- since just started last week, will not change before d/c- but will need H/H nursing or Urology to change.    14. Hypokalemia: supplement 40me BID today and repeat BMP tomorrow  4/8- K+ 3.5- borderline low- will recheck  Monday  4/11- K+ 3.3 will replete 40 mEq x2 and recheck Thursday   5/2- K+ down to 3.3- had been holding his own- will replete today 40 mEq x1  5/3- will recheck BMP in AM  5/4- results pending-   5/5- K+ 3.2- will start on low dos K+ daily and replete 40 mEq x2 15. Epidural abscess-  4/5- on  Cefazolin IV for prolonged IV ABX- pt's WBC down to 15.5 (was 14k yesterday, but 20.8 3 days ago)- low grade temps up to 100.2 in last 24 hours-will con't regimen   4/6-White count is down slightly to 14K-we will continue IV antibiotics and check CRP and ESR-weekly  4/7- afebrile- will recheck labs in AM  4/8- WBC stable- con't IV ABX.   4/11- WBC down to 11.3- ESR >140 and CRP 10.9- heading a little better- con't to monitor.   4/15 WBC 9.8, monitor weekly  5/2- WBC 10.4-  5/3- CRP down to 1.4 and ESR down to 82-  Need to call ID to see what plan is since about to finish IV ABX.   5/4- will d/c PICC_ has been switched to PO ABX- will check all labs tomorrow just to make sure doing OK.  16. Dysphagia?  D2 diet due to lack of teeth, not due to dysphagia- doesn't need pills crushed. 17. Vomiting: d/c cymbalta which may be contributing. IV zofran. KUB ordered and shows no abdominal pathology.   -4/23 improved off iron, reglan dc'ed  4/25- still nauseated- due to ileus  4/26- on liquid diet- con't regimen  4/27- ileus not much better- conn't liquid diet/Mg citrate-   4/28- as above- only NGT if KUB worse- pt agreed to this compromise- added another dose mg citrate and fleets enema.    4/29- ileus resolved  5/5- new ileus MUCH improved 18. Atelectasis: Recommend incentive spirometer to improve breathing strength and ability over time. Provided incentive spirometer education. 19. Lethargy: decreased valium to 1mg BID.    I spent a total of 40 minutes on care today- >50% on coordination of care- speaking with daughter in law about  Bowels/B. Program and OTC meds that need to be taken as well as reducing  pain meds.  Will need Urology f/u.   LOS: 33 days A FACE TO FACE EVALUATION WAS PERFORMED  Megan Lovorn 12/31/2020, 9:17 AM    

## 2020-12-31 NOTE — Progress Notes (Signed)
Pt up to chair during morning therapy. Tolorated about and pt moved back to bed. Daughter-in-law at bedside. Discussed bowel program. Has not done yet. Educated DNL. Informed that best after dinner. York Spaniel that husband will be home at that time and she will explain to him. She verbalized an understanding of the process and why its important to do. Also educated on foley care and showed how to empty. She was able to verbalize an understanding of foley care as well. Discussed checking blood sugar and administering insulin. Said she was told, " to check you tube". DNL reports that has a friend in health care that is just a call away if has any issues and a nurse will be there daily to check as well. DNL reports feeling good about bringing him home.

## 2020-12-31 NOTE — Progress Notes (Signed)
Occupational Therapy Discharge Summary  Patient Details  Name: Daniel Cline MRN: [id number] Date of Birth: 1956-04-18  Today's Date: 12/31/2020 OT Individual Time: 1545-1630 OT Individual Time Calculation (min): 45 min    Patient has met 6 of 9 long term goals due to improved activity tolerance, improved balance, postural control, ability to compensate for deficits, functional use of  RIGHT upper extremity and improved coordination.  Patient to discharge at [facility] Max Assist level.  Patient's care partner is independent to provide the necessary physical assistance at discharge.  The pt has been significantly limited during OT sessions d/t pain in neck/shoulders, limited sitting tolerance (now up to 2-3 minutes with Mod A), and performs bathing/dressing/toileting at bed level. Pt has learned multiple compensatory and adaptive techniques to increase indep and decrease caregiver burden. Son Elta Guadeloupe and DIL [name] have been present for many sessions for family ed and training.   Reasons goals not met: pt did not meet goals for bathing and toileting as he still needs Max - Total A  Recommendation:  Patient will benefit from ongoing skilled OT services in home health setting to continue to advance functional skills in the area of BADL and Reduce care partner burden.  Equipment: No equipment provided  At this time as he does not transfer to toilet, shower, etc. Will have [place] f/u with recommendations.   Reasons for discharge: lack of progress toward goals and discharge from hospital  Patient/family agrees with progress made and goals achieved: Yes   Skilled Intervention: Pt greeted at times of session semireclined in bed resting agreeable to OT session, aware it is grad day and today is last day. Politely declined ADL as he had "already done that today." Focus of session on education for directing care, positioning in bed for ADL, bed mobility, supine <> sit with Mod/Max A and able to sit EOB  with Mod A for 3-4 minutes, AAROM education with pt demonstration using RUE to assist LUE, HEP provided and reviewed there ex. Pt also issued additional build up handle/grip and performed oral hygiene set up. Pt reclined in bed resting alarm on call bell in reach. Pt with no OT questions at this time.  OT Discharge Precautions/Restrictions  Precautions Precautions: Cervical;Fall Restrictions Weight Bearing Restrictions: No Vital Signs Therapy Vitals Temp: 98.5 F (36.9 C) Temp Source: Oral Pulse Rate: 85 Resp: 17 BP: 106/64 Patient Position (if appropriate): Lying Oxygen Therapy SpO2: 96 % O2 Device: Room Air Pain Pain Assessment Pain Scale: 0-10 Pain Score: 4  ADL ADL Equipment Provided: Feeding equipment Eating: Set up Grooming: Supervision/safety Upper Body Bathing: Moderate assistance Where Assessed-Upper Body Bathing: Bed level Lower Body Bathing: Maximal assistance Where Assessed-Lower Body Bathing: Bed level Upper Body Dressing: Maximal assistance Where Assessed-Upper Body Dressing: Bed level Lower Body Dressing: Moderate assistance Where Assessed-Lower Body Dressing: Bed level Toileting: Dependent Toilet Transfer: Not assessed Tub/Shower Transfer: Not assessed Social research officer, government: Not assessed Vision Patient Visual Report: No change from baseline Perception  Perception: Impaired Praxis Praxis: Impaired Cognition Overall Cognitive Status: Within Functional Limits for tasks assessed Arousal/Alertness: Awake/alert Orientation Level: Oriented X4 Focused Attention: Appears intact Sustained Attention: Appears intact Memory: Appears intact Awareness: Appears intact Problem Solving: Appears intact Sensation Sensation Light Touch: Impaired by gross assessment Proprioception: Impaired by gross assessment Coordination Gross Motor Movements are Fluid and Coordinated: No Fine Motor Movements are Fluid and Coordinated: No Coordination and Movement  Description: very limited by pain (though improved), proximal weakness, edema and arthritic changes in hands,  weakness bilat hands Motor  Motor Motor: Tetraplegia Motor - Skilled Clinical Observations: quadraparesis, pain in neck/shoulders, limited AROM with ROM in R>L Mobility  Bed Mobility Bed Mobility: Rolling Right;Rolling Left;Supine to Sit;Sit to Supine;Scooting to [facility];Sitting - Scoot to [employer] of Bed Rolling Right: Contact Guard/Touching assist Rolling Left: Contact Guard/Touching assist Supine to Sit: Maximal Assistance - Patient - Patient 25-49% Sitting - Scoot to Edge of Bed: Minimal Assistance - Patient > 75% Scooting to HOB: Maximal Assistance - Patient 25-49%  Trunk/Postural Assessment  Cervical Assessment Cervical Assessment: Exceptions to [facility] Thoracic Assessment Thoracic Assessment: Exceptions to [facility] Postural Control Postural Control: Deficits on evaluation  Balance Balance Balance Assessed: Yes Static Sitting Balance Static Sitting - Balance Support: Feet supported;Bilateral upper extremity supported Static Sitting - Level of Assistance: 4: Min assist;3: Mod assist Dynamic Sitting Balance Dynamic Sitting - Balance Support: Feet supported;Bilateral upper extremity supported Dynamic Sitting - Level of Assistance: 3: Mod assist Extremity/Trunk Assessment RUE Assessment RUE Assessment: Exceptions to [facility] General Strength Comments: AROM/PROM WFL but limited strength - able to use more functionally at DC LUE Assessment LUE Assessment: Exceptions to [facility] General Strength Comments: very limited in AROM and strength, however able to obtain almost 90* shoulder flexion and increase in elbow ROM and grip strength   Viona Gilmore 12/31/2020, 5:04 PM

## 2021-01-01 ENCOUNTER — Other Ambulatory Visit (HOSPITAL_COMMUNITY): Payer: Self-pay

## 2021-01-01 LAB — GLUCOSE, CAPILLARY: Glucose-Capillary: 137 mg/dL — ABNORMAL HIGH (ref 70–99)

## 2021-01-01 MED ORDER — ACCU-CHEK GUIDE VI STRP
ORAL_STRIP | 12 refills | Status: AC
  Filled 2021-01-01: qty 100, 30d supply, fill #0

## 2021-01-01 MED ORDER — ACCU-CHEK GUIDE W/DEVICE KIT
PACK | 0 refills | Status: AC
  Filled 2021-01-01: qty 1, 1d supply, fill #0

## 2021-01-01 MED ORDER — BLOOD GLUCOSE METER KIT
PACK | 0 refills | Status: DC
  Filled 2021-01-01: qty 1, fill #0

## 2021-01-01 MED ORDER — BLOOD GLUCOSE METER KIT: PACK | 0 refills | Status: AC

## 2021-01-01 MED ORDER — ACCU-CHEK SOFTCLIX LANCETS MISC
5 refills | Status: AC
  Filled 2021-01-01: qty 100, 25d supply, fill #0

## 2021-01-01 NOTE — Progress Notes (Signed)
Patient ID: Daniel Cline, male   DOB: 11-10-1955, 65 y.o.   MRN: [id number]  This SW covering for primary [place].   SW received updates from nursing that family reported the [place] from [place] will not be delivered until 5/6-5/11. SW met with pt and family to discuss above, and offer alternative options to get wheelchairs until the one pt needs arrives such as [employer]. States they will speak with relatives that have wheelchairs and create a high back for pt. The family was not concerned about the wheelchair not arriving between estimated window. Family states they will back sure pt has what he needs. [facility] ambulance remains scheduled for [place], [place], [place] Office: [phone] Cell: [phone] Fax: [phone]

## 2021-01-01 NOTE — Progress Notes (Signed)
Patient is being discharged home via [facility]. Dan. Pa, has given discharge instructions to family, family verbalized an understanding of discharge instructions, medications, and follow up appointments. Education completed with family and verbalized an understanding. Transitional care delivered medications to family , and personal belongings taken by family.

## 2021-01-01 NOTE — Progress Notes (Signed)
PROGRESS NOTE   Subjective/Complaints:  Having stomach "upset"- not nauseated, just having BM right now.  Doesn't have Glucometer or ability to check BGs- asking for Rx.   ROS:   Pt denies SOB, abd pain, CP, N/V/C/D, and vision changes   Objective:   No results found. Recent Labs    12/31/20 0548  WBC 9.7  HGB 12.0*  HCT 36.6*  PLT 346   Recent Labs    12/30/20 1026 12/31/20 0548  NA 134* 136  K 3.3* 3.2*  CL 101 104  CO2 25 25  GLUCOSE 146* 111*  BUN 13 17  CREATININE 0.79 0.81  CALCIUM 8.9 9.2    Intake/Output Summary (Last 24 hours) at 01/01/2021 0814 Last data filed at 01/01/2021 0645 Gross per 24 hour  Intake 340 ml  Output 375 ml  Net -35 ml        Physical Exam: Vital Signs Blood pressure (!) 154/93, pulse 83, temperature 98.1 F (36.7 C), resp. rate 18, height 5' 7" (1.702 m), weight 75.7 kg, SpO2 100 %.        General: awake, alert, appropriate, supine in bed; per pt, having BM; NAD HENT: conjugate gaze; oropharynx moist CV: regular rate; no JVD Pulmonary: CTA B/L; no W/R/R- good air movement GI: soft, NT, ND, (+)BS Psychiatric: appropriate- more interactive Neurological: alert- HOH  Skin: neck has small scab- otherwise looked almost completely healed.  Musculoskeletal: PICC in LUE still    Comments: LUE-strength in his Left arm is 4-/5-in the deltoid, bicep, tricep, wrist extension, grip, and finger abduction RUE-deltoid bicep and tricep are 3/5, wrist extension and grip are 3-/5, LLE-4/5 in hip flexion, knee extension, and dorsiflexion as well as plantarflexion RLE-strength is 4-/5-in the same muscles TTP over L shoulder and L upper trap areas Neurological:  He reports his light touch is intact in all 4 extremities He has no lower extremity clonus bilaterally  Assessment/Plan: 1. Functional deficits which require 3+ hours per day of interdisciplinary therapy in a comprehensive  inpatient rehab setting.  Physiatrist is providing close team supervision and 24 hour management of active medical problems listed below.  Physiatrist and rehab team continue to assess barriers to discharge/monitor patient progress toward functional and medical goals  Care Tool:  Bathing  Bathing activity did not occur: Safety/medical concerns (Per OT eval on medical hold) Body parts bathed by patient: Chest,Abdomen,Face,Left arm,Right upper leg,Left upper leg   Body parts bathed by helper: Right arm,Front perineal area,Buttocks,Left lower leg,Right lower leg     Bathing assist Assist Level: Maximal Assistance - Patient 24 - 49%     Upper Body Dressing/Undressing Upper body dressing   What is the patient wearing?: Pull over shirt    Upper body assist Assist Level: Maximal Assistance - Patient 25 - 49%    Lower Body Dressing/Undressing Lower body dressing      What is the patient wearing?: Pants     Lower body assist Assist for lower body dressing: Moderate Assistance - Patient 50 - 74%     Toileting Toileting    Toileting assist Assist for toileting: Total Assistance - Patient < 25% Assistive Device Comment: foley   Transfers  Chair/bed transfer  Transfers assist  Chair/bed transfer activity did not occur: Refused  Chair/bed transfer assist level: Maximal Assistance - Patient 25 - 49%     Locomotion Ambulation   Ambulation assist   Ambulation activity did not occur: Safety/medical concerns          Walk 10 feet activity   Assist  Walk 10 feet activity did not occur: Safety/medical concerns        Walk 50 feet activity   Assist Walk 50 feet with 2 turns activity did not occur: Safety/medical concerns         Walk 150 feet activity   Assist Walk 150 feet activity did not occur: Safety/medical concerns         Walk 10 feet on uneven surface  activity   Assist Walk 10 feet on uneven surfaces activity did not occur: Safety/medical  concerns         Wheelchair     Assist Will patient use wheelchair at discharge?: Yes Type of Wheelchair: Manual Wheelchair activity did not occur: Refused  Wheelchair assist level: Dependent - Patient 0%      Wheelchair 50 feet with 2 turns activity    Assist    Wheelchair 50 feet with 2 turns activity did not occur: Refused   Assist Level: Dependent - Patient 0%   Wheelchair 150 feet activity     Assist  Wheelchair 150 feet activity did not occur: Refused   Assist Level: Dependent - Patient 0%   Blood pressure (!) 154/93, pulse 83, temperature 98.1 F (36.7 C), resp. rate 18, height 5' 7" (1.702 m), weight 75.7 kg, SpO2 100 %.  Medical Problem List and Plan: 1.  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  5/5 con't PT and OT- family training before d/c tomorrow   5/6- d/c today- will send with Glucometer, DM supplies Rx as well as other meds- will have to pay for MS Contin- insurance won't pay 2.  Acute DVT in peroneal/soleus: eliquis restarted 3. Pain Management: Patient is already receiving oxycodone as needed; we started tramadol scheduled but that is not working; will DC tramadol and schedule MS Contin 15 mg twice daily  - on Valium and Firoicet prn per NSU  D/c cymbalta which may be causing emesis.   Continue MS Contin as pain and ability to participate in therapy limited by pain. XR cervical spine stable, soft collar ordered.   4/27- pain sounds like down to 5/10 after d/w pt what he means- it's not 8/10 based on description, con't regimen  4/29- pt working better with therapy- slowly- and pain is "slightly better"- so guessing around 4/10 at rest  5/1:  requested kpad be given for his upper back pain, continue  5/2- like lidoderm and kpad- cannot use together.   5/3- will wean MS Contin to 15 mg qAM but don't feel comfortable stopping it altogether by d/c.   5/4- tolerated wean- don't feel comfortable doing less/stopping MS Contin- but will wean him slowly.   5/5- Will send home on MS Contin  1x/day and prn pain meds  5/6- not taking prn pain meds- will send home on MS Contin 1x/day 4. Mood: - Messaged SW regarding adding Mr. Chuba to Neuropsychologyschedule due to uncontrolled pain and new SCI.   4/27- will add Buspar 5 mg TID and change as required  5/2- less anxious- doing better with Buspar- con't regimen  5/5- working SO much better- con't regimen             -antipsychotic agents: N/A 5. Neuropsych: This patient is capable of making decisions on his own behalf. 6. Skin/Wound Care: We will change his honeycomb dressing on his neck to daily and as needed; will also change the dressing on his right forearm to just a dry dressing and change as needed. Less drainage today, will not require I&D  4/23 minimal drainage, almost healed incision- continue follow/monitor  5/4- just has a scab- healed over otherwise- con't to monitor 7. Fluids/Electrolytes/Nutrition: We will check labs on Monday 8. Uncontrolled Diabetes II- A1c of 13.9-continue patient's Levemir 10 units nightly and sliding scale insulin  4/23- CBGs bordereline. increase Levemir to 18 units nightly.    5/3- BGs 103-129- doing great- con't regimen  5/6- BGs controlled overall-c on't regimen 9. Hypertension hx with Orthostatic hypotension-patient is on HCTZ 25 mg daily as well as Norvasc 10 mg daily and lisinopril 40 mg daily and Lopressor 50 mg twice daily-blood pressure is well controlled we will continue these medications and monitor especially with increased activity  4/15: Have reduced Lisinopril, HCTZ, Norvasc and con't Lopressor- still having episode of orthostatic hypotension-  might need additional reduction- don't decreased B Blocker due to tachycardia frequently  4/29- BP controlled- no Orthostasis- con't regimen  4/30: SBP soft to 70s, decrease norvasc to 2.74m Vitals:   12/31/20 2044 01/01/21 0639  BP: 127/75 (!) 154/93  Pulse: 83 83  Resp:  18  Temp: 98.6 F (37 C) 98.1 F (36.7 C)  SpO2: 95% 100%    5/3- BP doing better- 110s-120s over 70s-80s- con't regimen  5/6- stopped Lisinopril and Norvasc yesterday since was not taking BP meds at home an dand still having Orthostasis sometimes- BP a little elevated this AM- might need to restart Norvasc vs increase HCTZ after d/c.  10.  Neurogenic bowel-we will look to start him on a bowel program nightly after dinner with Dulcolax suppository  4/15- KUB showed diffuse constipation- will give Sorbitol 60 ml to clean him out  4/25- full of stool AGAIN- even though on bowel program and on bowel meds- not sure if refusing?- Has diffuse ileus and severe constipation-will give Sorbitol again 60 ml= put on liquid diet- and increase bowel meds  4/26- pt thought was "OK" to refuse bowel meds and bowel program- explained is so ful of stool, needs 10+ BMs to clean out- ordered Mg citrate- and f/u KUB  4/27- KUB not much better- but will con't liquid diet- doesn't want NG tube- to finish Mg citrate today- d/w pt and family.   4/28- will recheck KUB today and also ordered Mg citrate again and Fleets enema at 3pm- pt said IF KUB worse, willing to do NGT, but only if worse  4/29- moderate stool on new KUB- ileus resolved- and had 3 BMs since KUB done- will switch back to regular diet and con't bowel program  5/3- will recheck KKUB since had N/V last afternoon, just in case-   5/4- had questionable forming bowel obstruction- recheck abd series- looked better- "resolving ileus"- will not  place NGT, but advised nursing to not let pt refuse bowel meds or bowel program like has been doing- on Senna 2 tabs BID and Miralax BID as well.    5/5- don't stop bowel program or meds- d/w daughter in law.  12.  Acute DVT in the soleus and peroneal-we will change his Lovenox to apixaban/Eliquis 10 mg BID x 1 week, then 5 mg BID.  13.  Neurogenic bladder-we will continue his Foley for now and start Flomax tomorrow 0.4 mg q. supper and see if he is able to start to void;  we might need to titrate this as required- will try and remove foley next week.   4/27- will start Chronic foley- since pt doesn't want family cathing him.  5/3- since just started last week, will not change before d/c- but will need H/H nursing or Urology to change.    14. Hypokalemia: supplement 85m BID today and repeat BMP tomorrow  4/8- K+ 3.5- borderline low- will recheck Monday  4/11- K+ 3.3 will replete 40 mEq x2 and recheck Thursday   5/2- K+ down to 3.3- had been holding his own- will replete today 40 mEq x1  5/3- will recheck BMP in AM  5/4- results pending-   5/5- K+ 3.2- will start on low dos K+ daily and replete 40 mEq x2 15. Epidural abscess-  4/5- on  Cefazolin IV for prolonged IV ABX- pt's WBC down to 15.5 (was 14k yesterday, but 20.8 3 days ago)- low grade temps up to 100.2 in last 24 hours-will con't regimen   4/6-White count is down slightly to 14K-we will continue IV antibiotics and check CRP and ESR-weekly  4/7- afebrile- will recheck labs in AM  4/8- WBC stable- con't IV ABX.   4/11- WBC down to 11.3- ESR >140 and CRP 10.9- heading a little better- con't to monitor.   4/15 WBC 9.8, monitor weekly  5/2- WBC 10.4-  5/3- CRP down to 1.4 and ESR down to 82-  Need to call ID to see what plan is since about to finish IV ABX.   5/4- will d/c PICC_ has been switched to PO ABX- will check all labs tomorrow just to make sure doing OK.   5/6- sending home with ID f/u and PO ABX.  16. Dysphagia?  D2 diet due to lack of teeth, not due to dysphagia- doesn't need pills crushed. 17. Vomiting: d/c cymbalta which may be contributing. IV zofran. KUB ordered and  shows no abdominal pathology.   -4/23 improved off iron, reglan dc'ed  4/25- still nauseated- due to ileus  4/26- on liquid diet- con't regimen  4/27- ileus not much better- conn't liquid diet/Mg citrate-   4/28- as above- only NGT if KUB worse- pt agreed to this compromise- added another dose mg citrate and fleets enema.    4/29- ileus resolved  5/5- new ileus MUCH improved 18. Atelectasis: Recommend incentive spirometer to improve breathing strength and ability over time. Provided incentive spirometer education. 19. Lethargy: decreased valium to 159mBID.  20. Dispo  5/6- d/c today on transport.    [facility]/01/2021, 8:14 AM

## 2021-01-05 ENCOUNTER — Telehealth (HOSPITAL_COMMUNITY): Payer: Self-pay

## 2021-01-05 ENCOUNTER — Telehealth: Payer: Self-pay | Admitting: *Deleted

## 2021-01-05 NOTE — Telephone Encounter (Signed)
Daughter is primary caregiver. She is well informed about's pt's status, meds, and PT. She prepares meds. Monitoring BP and BG- says BP remains low. All meds are being adhered to- amlodipine, apixaban, etc.Daughter states pt has been colder than usual and has been giving blankets.

## 2021-01-05 NOTE — Telephone Encounter (Signed)
Chris PT [facility] called for POC 2wk8 and SN 2wk2, 1wk6.  Approval given.

## 2021-01-07 ENCOUNTER — Telehealth: Payer: Self-pay | Admitting: *Deleted

## 2021-01-07 ENCOUNTER — Encounter: Admit: 2021-01-07 | Discharge: 2021-01-07 | Payer: MEDICARE

## 2021-01-07 DIAGNOSIS — E785 Hyperlipidemia, unspecified: Secondary | ICD-10-CM

## 2021-01-07 DIAGNOSIS — K219 Gastro-esophageal reflux disease without esophagitis: Secondary | ICD-10-CM

## 2021-01-07 DIAGNOSIS — K56609 Unspecified intestinal obstruction, unspecified as to partial versus complete obstruction: Secondary | ICD-10-CM

## 2021-01-07 DIAGNOSIS — D3A098 Benign carcinoid tumors of other sites: Secondary | ICD-10-CM

## 2021-01-07 DIAGNOSIS — K589 Irritable bowel syndrome without diarrhea: Secondary | ICD-10-CM

## 2021-01-07 DIAGNOSIS — C179 Malignant neoplasm of small intestine, unspecified: Secondary | ICD-10-CM

## 2021-01-07 NOTE — Progress Notes
Name: Louis [name]          MRN: [id number]      DOB: 25-Sep-1955      AGE: 65 y.o.   DATE OF SERVICE: 01/07/2021    Subjective:             Reason for Visit: Annual follow-up of patient with a history of small bowel carcinoid in remission  Follow Up      Louis [name] is a 65 y.o. male.         History of Present Illness     Louis [name] is seen along with his wife in follow-up of his small bowel carcinoid which is in remission.   He continues to do well with no evidence of recurrence.  His last scans in 2021 were excellent and we are only planning further scans if symptoms or lab changes.  He had lab work at [facility] recently.  CBC and CMP look great.  Serotonin level is pending.    He had a concerning skin lesion of the right temple region when I saw him for his last checkup so I did refer him to dermatology.  He saw dermatologist Dr. Jena Gauss in [place] and this was a basal cell carcinoma which was resected.  He also had some other skin lesions evaluated and does have a follow-up with them in September 2022.    He is accompanied by his wife.  They are very involved with their 5 grandchildren who live locally.       Review of Systems     Constitutional: Negative for fever and chills.   HENT: Negative for ear pain, sore throat, mouth sores, trouble swallowing, neck pain and sinus pressure.    Eyes: Negative for visual disturbance.   Respiratory: Negative for shortness of breath.    Cardiovascular: Negative for chest pain.   Gastrointestinal: Negative for nausea, diarrhea, constipation and blood in stool. He rarely will note a slight epigastric discomfort but when he pushes on his abdomen in that area it resolves completely.  Genitourinary: Negative for dysuria. See above.  Musculoskeletal:.  Some chronic knee arthritis.   Neurological: Negative for weakness, numbness and headaches.   Hematological: Does  bruise/bleed easily.   Psychiatric/Behavioral: Negative for confusion and decreased concentration. The patient is not nervous/anxious.        Objective:         ? acetaminophen (TYLENOL) 500 mg tablet Take 1,000 mg by mouth every 6 hours as needed.   ? COQ10 (LIPOSOMAL UBIQUINOL) PO Take  by mouth.   ? famotidine (PEPCID) 20 mg tablet Take 20 mg by mouth daily as needed.   ? IBUPROFEN (ADVIL PO) Take 200 mg by mouth. Patient takes three tabs at a time for a total dose of 600mg  as needed for pain   ? peg-electrolyte solution (NULYTELY) 420 gram oral solution Mix as directed on package. Refrigerate once mixed. Do not mix greater than 24 hours prior to procedure. Drink 3/4 of bottle between 5pm and 7 pm the night before procedure. Drink remaining 1/4 of bottle 5 hours prior to procedure   ? polyethylene glycol 3350 (GLYCOLAX; MIRALAX) 17 gram/dose powder Take 17 g by mouth daily as needed (Constipation).   ? tamsulosin (FLOMAX) 0.4 mg capsule Take 0.4 mg by mouth daily.   ? vitamins, multiple cap Take 1 Cap by mouth daily.     Vitals:    01/07/21 0942   BP: 122/68   BP Source: Arm, Right Upper  Pulse: 87   Temp: 36.9 ?C (98.5 ?F)   Resp: 16   SpO2: 97%   TempSrc: Oral   PainSc: Zero   Weight: 101.2 kg (223 lb 3.2 oz)   Height: 175.3 cm (5' 9)     Body mass index is 32.96 kg/m?Marland Kitchen     Pain Score: Zero       Pain Addressed:  N/A    Patient Evaluated for a Clinical Trial: Patient not eligible for a treatment trial (including not needing treatment, needs palliative care, in remission).     [place] Cooperative Oncology Group performance status is 0, Fully active, able to carry on all pre-disease performance without restriction.Marland Kitchen     Physical Exam     A pleasant conversant Caucasian male in no distress.  HEENT: Lesion right temple has been resected and postoperative changes/telangiectasias are benign.    Neuro: Alert and oriented ?3.     Assessment and Plan:  History of small-bowel carcinoid status post initial surgical resection Jan 22, 2013, with persistent abnormality and octreotide scan consistent with mesenteric mass/lymph node with resection by Dr. Rainey Pines in December, 2014, revealing carcinoid. His followup CT scans and octreotide scans have been normal.  He is asymptomatic. There was no evidence of disease on his  octreotide scan 12/31/2019. I will plan to see him in followup in one year with lab work, and clinical examination but will not plan any radiologic studies unless he has any concerning symptoms.    This note is partially generated using voice recognition software.  Please excuse any typographical errors.  ?  ?

## 2021-01-07 NOTE — Telephone Encounter (Signed)
Lori from [facility] called to report that Daniel Cline is running a pulse rate 126-128. No other symptoms. I called her back and asked that she call the primary care Daniel Peak PA.

## 2021-01-11 ENCOUNTER — Encounter: Admit: 2021-01-11 | Discharge: 2021-01-11 | Payer: MEDICARE

## 2021-01-11 DIAGNOSIS — D3A098 Benign carcinoid tumors of other sites: Secondary | ICD-10-CM

## 2021-01-11 LAB — CBC AND DIFF

## 2021-01-11 LAB — COMPREHENSIVE METABOLIC PANEL

## 2021-01-15 ENCOUNTER — Ambulatory Visit (INDEPENDENT_AMBULATORY_CARE_PROVIDER_SITE_OTHER): Payer: Self-pay | Admitting: Internal Medicine

## 2021-01-15 ENCOUNTER — Other Ambulatory Visit: Payer: Self-pay

## 2021-01-15 ENCOUNTER — Encounter: Payer: Self-pay | Admitting: Internal Medicine

## 2021-01-15 VITALS — BP 106/67 | HR 130 | Temp 97.3°F

## 2021-01-15 DIAGNOSIS — E1165 Type 2 diabetes mellitus with hyperglycemia: Secondary | ICD-10-CM

## 2021-01-15 DIAGNOSIS — G061 Intraspinal abscess and granuloma: Secondary | ICD-10-CM | POA: Diagnosis not present

## 2021-01-15 DIAGNOSIS — A4901 Methicillin susceptible Staphylococcus aureus infection, unspecified site: Secondary | ICD-10-CM | POA: Diagnosis not present

## 2021-01-16 LAB — SEDIMENTATION RATE: Sed Rate: 51 mm/h — ABNORMAL HIGH (ref 0–20)

## 2021-01-16 LAB — C-REACTIVE PROTEIN: CRP: 10.4 mg/L — ABNORMAL HIGH (ref <8.0)

## 2021-01-18 ENCOUNTER — Encounter: Payer: Medicare Other | Admitting: Registered Nurse

## 2021-01-19 ENCOUNTER — Encounter: Payer: Self-pay | Admitting: Internal Medicine

## 2021-01-19 NOTE — Progress Notes (Signed)
   Subjective:    Patient ID: Daniel Cline, male    DOB: 02/16/1956, 65 y.o.   MRN: [id number]  HPI Here for hsfu He developed a cervical epidural abscess requiring laminectomy by Dr. Jordan Likes on 11/18/20.  He also had a forearm abscess with an I and D by Dr. Aundria Rud on 11/20/20.  Culture was positive for MSSA.  He completed IV cefazolin for 6 weeks in rehab and earlier this month was seen by Dr. Renold Don since his cervical wound was still a bit open and had some serosanguinous drainage and crp remained a bit elevated. MRI was repeated and noted a fluid collection most c/w a seroma.  He was then continued on oral treatment with cefadroxil for one month planned through 01/28/21.  His cervical area is now closed, no concerns. Here for follow up.    Review of Systems  Constitutional: Negative for fatigue.  Gastrointestinal: Negative for diarrhea and nausea.  Skin: Negative for rash.       Objective:   Physical Exam Musculoskeletal:     Comments: Cervical incision area closed, no surrounding erythema, no drainage or warmth.   Skin:    Findings: No rash.  Neurological:     Mental Status: He is alert.  Psychiatric:        Mood and Affect: Mood normal.   SH: no tobacco       Assessment & Plan:

## 2021-01-19 NOTE — Assessment & Plan Note (Signed)
This has cleared and no new concerns.

## 2021-01-19 NOTE — Assessment & Plan Note (Signed)
Emphasized need for good blood sugar control to reduce infection risk.

## 2021-01-19 NOTE — Assessment & Plan Note (Addendum)
Doing well at this time, no significant pain or changes.  Incision area closed.   Inflammatory markers at the time of the visit noted and ESR has trended down to 50 and CRP similar at 10.4 mg/L from 1.4 mg/mL previously.   No concerns and he will finish his antibiotics and will observe off of antibiotics. He will return with any concerns.  

## 2021-01-29 ENCOUNTER — Encounter: Payer: Self-pay | Attending: Registered Nurse | Admitting: Physical Medicine and Rehabilitation

## 2021-01-29 ENCOUNTER — Other Ambulatory Visit: Payer: Self-pay

## 2021-01-29 ENCOUNTER — Encounter: Payer: Self-pay | Admitting: Physical Medicine and Rehabilitation

## 2021-01-29 VITALS — BP 113/78 | HR 119 | Temp 98.2°F

## 2021-01-29 DIAGNOSIS — G061 Intraspinal abscess and granuloma: Secondary | ICD-10-CM | POA: Insufficient documentation

## 2021-01-29 DIAGNOSIS — Z993 Dependence on wheelchair: Secondary | ICD-10-CM | POA: Insufficient documentation

## 2021-01-29 DIAGNOSIS — G959 Disease of spinal cord, unspecified: Secondary | ICD-10-CM | POA: Insufficient documentation

## 2021-01-29 DIAGNOSIS — I951 Orthostatic hypotension: Secondary | ICD-10-CM | POA: Insufficient documentation

## 2021-01-29 DIAGNOSIS — R252 Cramp and spasm: Secondary | ICD-10-CM | POA: Insufficient documentation

## 2021-01-29 MED ORDER — BACLOFEN 10 MG PO TABS: 5.0000 mg | ORAL_TABLET | Freq: Three times a day (TID) | ORAL | 5 refills | Status: DC

## 2021-01-29 NOTE — Progress Notes (Signed)
Subjective:    Patient ID: Daniel Cline, male    DOB: 23-Jun-1956, 65 y.o.   MRN: [id number]  HPI   Pt is a 65 yr old male with hx of Incomplete ASIA D quadriplegia-secondary to epidural abscess. Neurogenic bowel and bladder- with chronic foley, as well as pain- also has DM- last A1c of 13.9, HTN with orthostatic hypotension;  anxiety/depression , Acute DVT here for hospital f/u.   Not taking pain meds Didn't take any oxycodone.  Took MS Contin for 7 days then off for 2.5 weeks.  Was using lidocaine patches for awhile.  Still has pain in L shoulder and neck- on L side.    Finishing PO ABX today.  No more ABX.  Weaned off Valium and Buspar- been off both for 1 week.   Less anxious-   BP is really low- when sits up. Sometimes dangerously low when sits up.   PCP- wasn't sure about the DVT/clot-  Apixiban- is really expensive.   Not using suppositories and Miralax- stopped after 1st week! Can tell when needs to go!  Pain Inventory Average Pain 3 Pain Right Now 3 My pain is dull  In the last 24 hours, has pain interfered with the following? General activity 0 Relation with others 0 Enjoyment of life 0 What TIME of day is your pain at its worst? daytime Sleep (in general) Good  Pain is worse with: sitting and standing Pain improves with: rest Relief from Meds: na  ability to climb steps?  no do you drive?  no use a wheelchair  retired I need assistance with the following:  dressing, bathing and toileting  weakness dizziness  HFU  HFU    No family history on file. Social History   Socioeconomic History  . Marital status: Single    Spouse name: Not on file  . Number of children: Not on file  . Years of education: Not on file  . Highest education level: Not on file  Occupational History  . Not on file  Tobacco Use  . Smoking status: Never Smoker  . Smokeless tobacco: Never Used  Substance and Sexual Activity  . Alcohol use: Yes    Comment:  occ  . Drug use: Never  . Sexual activity: Not Currently  Other Topics Concern  . Not on file  Social History Narrative  . Not on file   Social Determinants of Health   Financial Resource Strain: Not on file  Food Insecurity: Not on file  Transportation Needs: Not on file  Physical Activity: Not on file  Stress: Not on file  Social Connections: Not on file   Past Surgical History:  Procedure Laterality Date  . I & D EXTREMITY Right 11/20/2020   Procedure: IRRIGATION AND DEBRIDEMENT FOREARM;  Surgeon: Yolonda Kida, MD;  Location: [facility] OR;  Service: Orthopedics;  Laterality: Right;  . POSTERIOR CERVICAL LAMINECTOMY N/A 11/18/2020   Procedure: CERVICAL THREE-FOUR, CERVICAL FOUR-FIVE, CERVICAL FIVE-SIX, CERVICAL SIX-SEVEN CERVICAL LAMINECTOMY FOR EVACUATION OF ABSCESS;  Surgeon: Julio Sicks, MD;  Location: [facility] OR;  Service: Neurosurgery;  Laterality: N/A;   Past Medical History:  Diagnosis Date  . Diabetes mellitus without complication (HCC)    BP 113/78   Pulse (!) 119   Temp 98.2 F (36.8 C)   SpO2 95%   Opioid Risk Score:   Fall Risk Score:  `1  Depression screen PHQ 2/9  Depression screen PHQ 2/9 01/29/2021  Decreased Interest 0  Down, Depressed, Hopeless 0  PHQ - 2 Score 0  Altered sleeping 1  Tired, decreased energy 1  Change in appetite 0  Feeling bad or failure about yourself  0  Trouble concentrating 0  Moving slowly or fidgety/restless 1  Suicidal thoughts 0  PHQ-9 Score 3  Difficult doing work/chores Not difficult at all    Review of Systems  Musculoskeletal:       Shoulder pain  All other systems reviewed and are negative.      Objective:   Physical Exam  Awake, alert, HOH , with son/daughter in law; NAD In reclining manual w/c.  Has MAS of 2-3 in LUE esp in shoulder (+) Hoffman's in LUE.       Assessment & Plan:   Pt is a 65 yr old male with hx of Incomplete ASIA D quadriplegia-secondary to epidural abscess. Neurogenic bowel and  bladder- with chronic foley, as well as pain- also has DM- last A1c of 13.9, HTN with orthostatic hypotension;  anxiety/depression , Acute DVT here for hospital f/u.  1. Stop the HCTZ and keep the Lisinopril at night. For kidney protection- based on having DM  2. Will finish Apixiban- and do the Lovenox injections I gave pt- 20 syringes- so can finish out the 3 months on treatment dose.    3. Finish Flomax and stop it.   4. Baclofen for spasticity/pain of L shoulder- 5 mg 3x/day x 1-2 weeks; 10 mg 3x/day Risk of side effects- constipation usually mild and mild sedation to look for.   5. Off PO ABX as of today- last day- so abscess healed.   6. Use therabands for exercises- not actual weights- can use up to 5 lbs weights max. .   7. F/U 2 months- double appointment this time- hopefully to wean to normal visit.

## 2021-01-29 NOTE — Patient Instructions (Signed)
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 Neurogenic bowel and bladder- with chronic foley, as well as pain- also has DM- last A1c of 13.9, HTN with orthostatic hypotension;  anxiety/depression , Acute DVT here for hospital f/u.  1. Stop the HCTZ and keep the Lisinopril at night. For kidney protection- based on having DM  2. Will finish Apixiban- and do the Lovenox injections I gave pt- 20 syringes- so can finish out the 3 months on treatment dose.    3. Finish Flomax and stop it.   4. Baclofen for spasticity/pain of L shoulder- 5 mg 3x/day x 1-2 weeks; 10 mg 3x/day Risk of side effects- constipation usually mild and mild sedation to look for.   5. Off PO ABX as of today- last day- so abscess healed.   6. Use therabands for exercises- not actual weights- can use up to 5 lbs weights max. .   7. F/U 3 months- double appointment

## 2021-02-05 ENCOUNTER — Other Ambulatory Visit: Payer: Self-pay

## 2021-02-16 ENCOUNTER — Ambulatory Visit: Payer: Self-pay | Admitting: Internal Medicine

## 2021-02-23 ENCOUNTER — Other Ambulatory Visit: Payer: Self-pay

## 2021-02-23 ENCOUNTER — Ambulatory Visit: Payer: Self-pay | Admitting: Critical Care Medicine

## 2021-02-25 ENCOUNTER — Other Ambulatory Visit: Payer: Self-pay

## 2021-02-25 ENCOUNTER — Telehealth: Payer: Self-pay | Admitting: Physical Medicine and Rehabilitation

## 2021-02-25 ENCOUNTER — Ambulatory Visit: Payer: Self-pay | Attending: Critical Care Medicine | Admitting: Critical Care Medicine

## 2021-02-25 DIAGNOSIS — E1165 Type 2 diabetes mellitus with hyperglycemia: Secondary | ICD-10-CM

## 2021-02-25 NOTE — Progress Notes (Signed)
Pt came to [facility] thinking this was a urology office.  He has a PCP in [place].  I will ask our Case Manager Robyne Peers to connect with rehab case management to f/u on any gaps in care.  He really needs urology appt

## 2021-02-25 NOTE — Telephone Encounter (Signed)
Appointment made for patient with [facility] for 7/14 2:45 for 3 pm appt. Called DIL and informed her that he will be seeing the resident (no cost) except for $15 or so for lab work. He does have HHRN who is coming in and changed bag last week with plans to change foley next week per report.

## 2021-03-02 ENCOUNTER — Telehealth: Payer: Self-pay

## 2021-03-02 NOTE — Telephone Encounter (Signed)
Request granted for continuation of in-home physical therapy. To address ambulation & transfers twice a week for 8 weeks.  Call back ph Christ PT, [facility] # [phone]  Chart reviewed per protocol.

## 2021-03-03 ENCOUNTER — Telehealth: Payer: Self-pay | Admitting: Physical Medicine and Rehabilitation

## 2021-03-03 MED ORDER — SULFAMETHOXAZOLE-TRIMETHOPRIM 800-160 MG PO TABS: 1.0000 | ORAL_TABLET | Freq: Two times a day (BID) | ORAL | 0 refills | Status: AC

## 2021-03-03 NOTE — Telephone Encounter (Signed)
Lab result return- can you please review as ML is out of town

## 2021-03-03 NOTE — Telephone Encounter (Signed)
I spoke with Dr Hamrick's office.  Dr Nathanial Rancher is out of the office for the week as well as Dr Berline Chough. They do not have any record of Tx order for UTI. I notified them that Dr Riley Kill will order the antibiotic for Mr Beeck.  I have let Mr Guthmiller's family know.

## 2021-03-03 NOTE — Telephone Encounter (Signed)
There is 100k enterobacter in urine of patient with indwelling foley. I did not order this urine culture. His primary did (Hamrick)?    Need to make sure that this doctor hasn't already written him an rx. If I were the prescribing MD I would prescribe bactrim ds, 1 tab tid for 7 days #14

## 2021-03-04 ENCOUNTER — Telehealth: Payer: Self-pay | Admitting: *Deleted

## 2021-03-04 NOTE — Telephone Encounter (Signed)
Received a call from [facility] pharmacist saying that Daniel Cline is insisting he was told by Dr Berline Chough he could take his baclofen 10 mg 5x day.  The pharmacist says they have it down as 4 x per day. I have read all notes since discharge from inpt rehab and there is no mention of increasing frequency of baclofen. We have one order only written by Dr Berline Chough on 01/29/21 that says Baclofen 10 mg: Take 0.5 tablets (5 mg total) by mouth 3 (three) times daily. X 1 week, then 10 mg 3x/day- for spasticity, disp #90 5 rf.  There has been no other rx written and at no other frequency.

## 2021-03-04 NOTE — Telephone Encounter (Signed)
Tiffany from [facility] called to get a VO for SN to go out to change foley--they can do today if we call them back.  I see that he has appt with urology 03/11/21.  I checked with Marissa Nestle PA since Dr Berline Chough is out of office, and she gave ok for them to go out. I have let Tiffany know.

## 2021-03-15 ENCOUNTER — Telehealth: Payer: Self-pay | Admitting: *Deleted

## 2021-03-15 ENCOUNTER — Other Ambulatory Visit (HOSPITAL_COMMUNITY): Payer: Self-pay | Admitting: Urology

## 2021-03-15 DIAGNOSIS — N318 Other neuromuscular dysfunction of bladder: Secondary | ICD-10-CM

## 2021-03-15 NOTE — Telephone Encounter (Signed)
Daniel Cline's daughter in law Herbert Seta called about him having extreme nausea for past few days and is worsening.  She is asking for something for nausea for him.  She mentioned medication but did not say what med she felt it is related to.

## 2021-03-15 NOTE — Telephone Encounter (Signed)
[facility] called for SN POC 1wk9 and 2 prns. Approval given.

## 2021-03-16 ENCOUNTER — Encounter (HOSPITAL_COMMUNITY): Payer: Self-pay | Admitting: Radiology

## 2021-03-16 MED ORDER — BACLOFEN 5 MG PO TABS: 5.0000 mg | ORAL_TABLET | Freq: Three times a day (TID) | ORAL | 5 refills | Status: AC

## 2021-03-16 MED ORDER — PROMETHAZINE HCL 12.5 MG PO TABS: 12.5000 mg | ORAL_TABLET | Freq: Four times a day (QID) | ORAL | 1 refills | Status: AC | PRN

## 2021-03-16 NOTE — Progress Notes (Signed)
Patient Demographics  Patient Name  Daniel Cline, Daniel Cline Legal Sex  Male DOB  1955-12-06 SSN  [id number] Address  [address]  [place] [address] Phone  [phone] [facility])  [phone] (Work)  [phone] (Mobile) *Preferred*     RE: CT Drain / Suprapubic tube placement Received: Today Simonne Come, MD  Henry Russel D OK for CT guided suprapubic catheter placement.   Prefer to perform procedure at [facility], but if there is a significant delay, may be scheduled at [facility].   Nedra Hai         Previous Messages    ----- Message -----  From: Henry Russel D  Sent: 03/15/2021   5:24 PM EDT  To: Simonne Come, MD  Subject: CT Drain / Suprapubic tube placement           Sent this one to you earlier but realized we have patient in system twice or different patient. Please advise if still ok to scheduled.   Procedure:  CT Drain Placement/ Suprapubic tube placement   Reason:  Other neuromuscular dysfunction of bladder   History:  no prior imaging   Provider:  Berniece Salines W   Provider Contact:  [phone]

## 2021-03-16 NOTE — Telephone Encounter (Signed)
I talked to him- we have actually decreased Baclofen to 5 mg TID- spoke with his daughter in law who's caring for him- thanks- ML

## 2021-03-16 NOTE — Telephone Encounter (Signed)
Had  ABX 2 weeks ago.  Last week, got miralax x 3 days- still didn't have BM- gave suppository.  Woke up having BM- used it 7 times before 7am (started at 4am). Was nauseated at that time.  Felt nauseated- last Thursday- even after 7 BM's.  Soft firm- not formed- 3 small Bms this AM  Eating little bites- but still nauseated, in spite of that.   Went to [employer]. The bug that had grown didn't match the ABX sent.   Nurse went to see him this AM- sat up in bed.   When sits up, felt like someone choking him.  So wants to lay back down in bed.   So supposed to send in Augmentin or something like that.  Has UTI- again- so would be a 3rd ABX now.    Will send phenergan/promethazine 12.5 mg  1-2 tabs q6 hours as needed- #90- 1 refill.  Not Zofran since can cause constipation.   When upped to 10 mg Baclofen - made him feel bad.  Baclofen 5 mg TID is working [employer].   Will change to 5 mg tablets and call that in 5 mg 3x/day.

## 2021-03-22 ENCOUNTER — Other Ambulatory Visit: Payer: Self-pay | Admitting: Radiology

## 2021-03-23 ENCOUNTER — Encounter (HOSPITAL_COMMUNITY): Payer: Self-pay

## 2021-03-23 ENCOUNTER — Other Ambulatory Visit: Payer: Self-pay

## 2021-03-23 ENCOUNTER — Ambulatory Visit (HOSPITAL_COMMUNITY)
Admission: RE | Admit: 2021-03-23 | Discharge: 2021-03-23 | Disposition: A | Discharge status: Home / Self Care | Payer: Self-pay | Source: Ambulatory Visit | Attending: Urology | Admitting: Urology

## 2021-03-23 DIAGNOSIS — N318 Other neuromuscular dysfunction of bladder: Secondary | ICD-10-CM | POA: Insufficient documentation

## 2021-03-23 LAB — CBC WITH DIFFERENTIAL/PLATELET
Abs Immature Granulocytes: 0.12 10*3/uL — ABNORMAL HIGH (ref 0.00–0.07)
Basophils Absolute: 0.2 10*3/uL — ABNORMAL HIGH (ref 0.0–0.1)
Basophils Relative: 1 %
Eosinophils Absolute: 0.2 10*3/uL (ref 0.0–0.5)
Eosinophils Relative: 1 %
HCT: 43 % (ref 39.0–52.0)
Hemoglobin: 14.4 g/dL (ref 13.0–17.0)
Immature Granulocytes: 1 %
Lymphocytes Relative: 32 %
Lymphs Abs: 4.8 10*3/uL — ABNORMAL HIGH (ref 0.7–4.0)
MCH: 31 pg (ref 26.0–34.0)
MCHC: 33.5 g/dL (ref 30.0–36.0)
MCV: 92.5 fL (ref 80.0–100.0)
Monocytes Absolute: 1.8 10*3/uL — ABNORMAL HIGH (ref 0.1–1.0)
Monocytes Relative: 12 %
Neutro Abs: 8.2 10*3/uL — ABNORMAL HIGH (ref 1.7–7.7)
Neutrophils Relative %: 53 %
Platelets: 380 10*3/uL (ref 150–400)
RBC: 4.65 MIL/uL (ref 4.22–5.81)
RDW: 13.6 % (ref 11.5–15.5)
WBC: 15.3 10*3/uL — ABNORMAL HIGH (ref 4.0–10.5)
nRBC: 0 % (ref 0.0–0.2)

## 2021-03-23 LAB — PROTIME-INR
INR: 0.9 (ref 0.8–1.2)
Prothrombin Time: 12.2 seconds (ref 11.4–15.2)

## 2021-03-23 LAB — BASIC METABOLIC PANEL
Anion gap: 7 (ref 5–15)
BUN: 15 mg/dL (ref 8–23)
CO2: 19 mmol/L — ABNORMAL LOW (ref 22–32)
Calcium: 8.7 mg/dL — ABNORMAL LOW (ref 8.9–10.3)
Chloride: 110 mmol/L (ref 98–111)
Creatinine, Ser: 0.6 mg/dL — ABNORMAL LOW (ref 0.61–1.24)
GFR, Estimated: >60 mL/min (ref >60)
Glucose, Bld: 96 mg/dL (ref 70–99)
Potassium: 4.2 mmol/L (ref 3.5–5.1)
Sodium: 136 mmol/L (ref 135–145)

## 2021-03-23 LAB — GLUCOSE, CAPILLARY: Glucose-Capillary: 85 mg/dL (ref 70–99)

## 2021-03-23 IMAGING — CT CT IMAGE GUIDED DRAINAGE BY PERCUTANEOUS CATHETER
1 of 2 series · 14 of 32 positions shown, 19 images · non-contrast
Comparison: None.

CLINICAL DATA: Normal muscular dysfunction of bladder, recurrent
urinary tract infections

EXAM:
CT GUIDED SUPRAPUBIC CATHETER PLACEMENT

[Series 2: i-spiral 5.0 bf37 · axial · 0.86mm/px · z∈[-228,-70]mm · 14 of 51 slices shown, 19 images]
[im 3/51  soft-tissue]
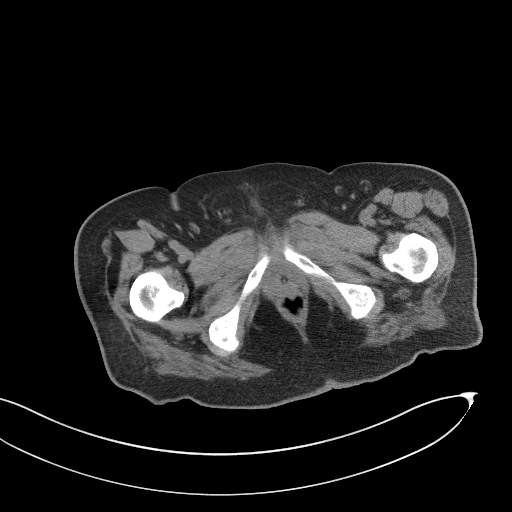
[im 3/51  bone]
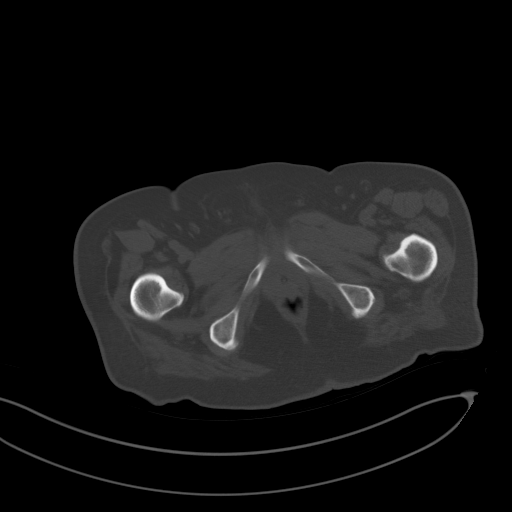
[im 6/51  soft-tissue]
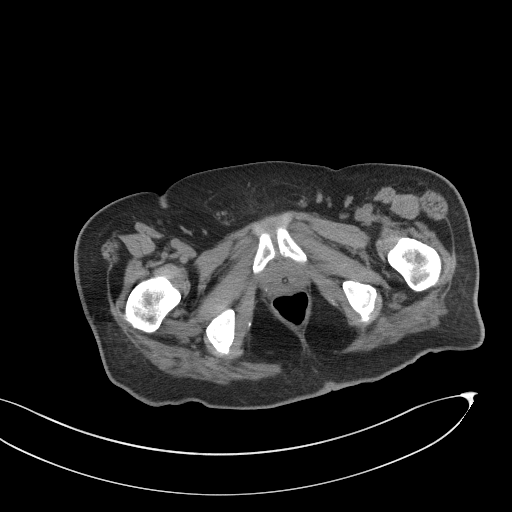
[im 12/51  soft-tissue]
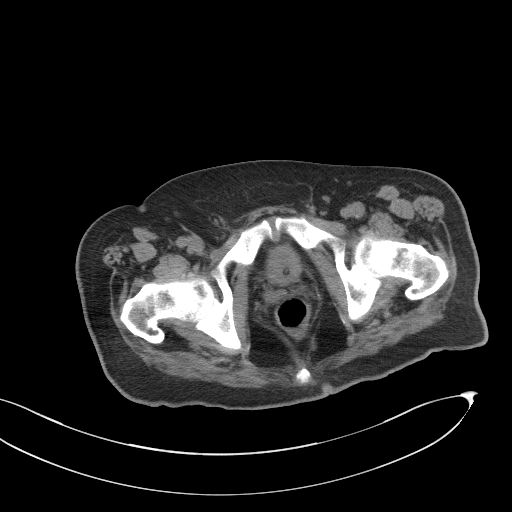
[im 14/51  soft-tissue]
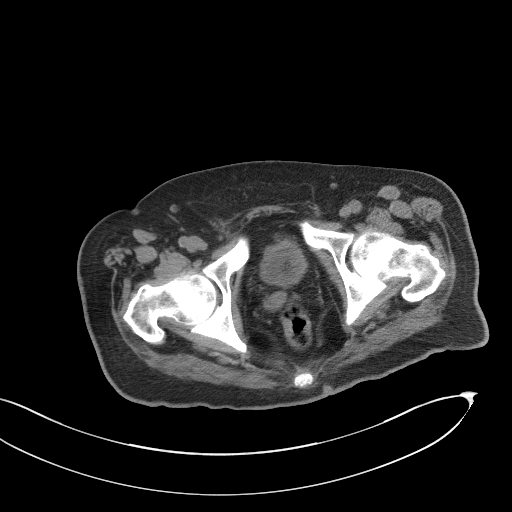
[im 17/51  soft-tissue]
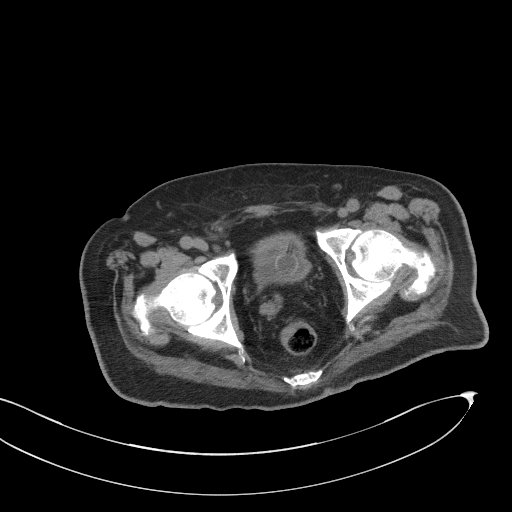
[im 23/51  soft-tissue]
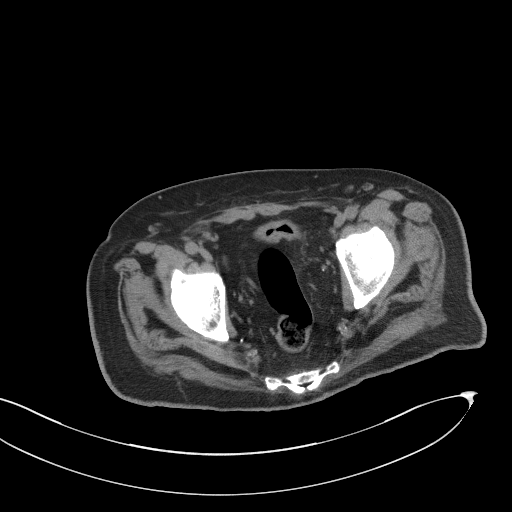
[im 26/51  soft-tissue]
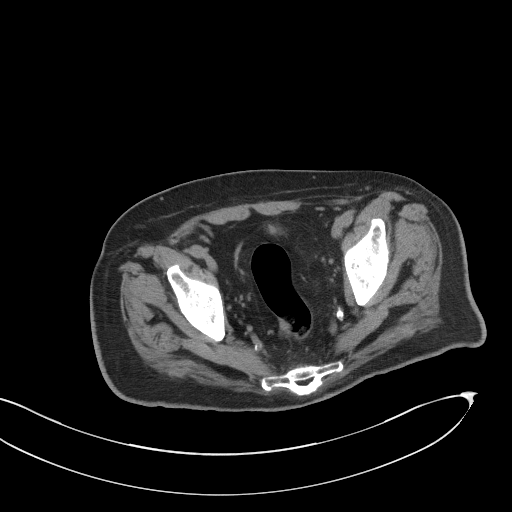
[im 28/51  soft-tissue]
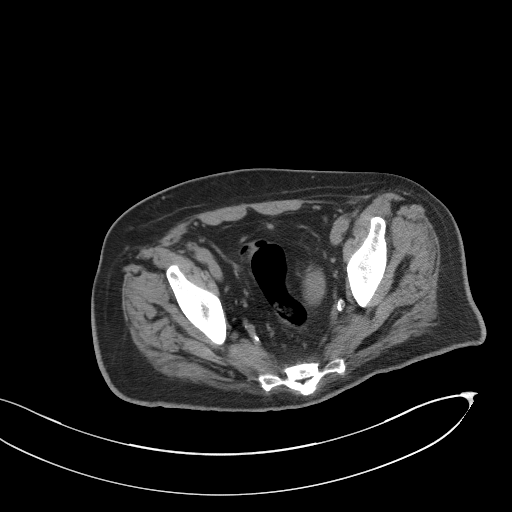
[im 34/51  soft-tissue]
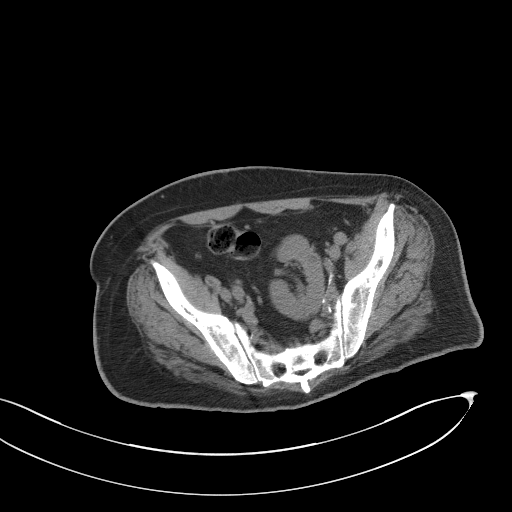
[im 34/51  bone]
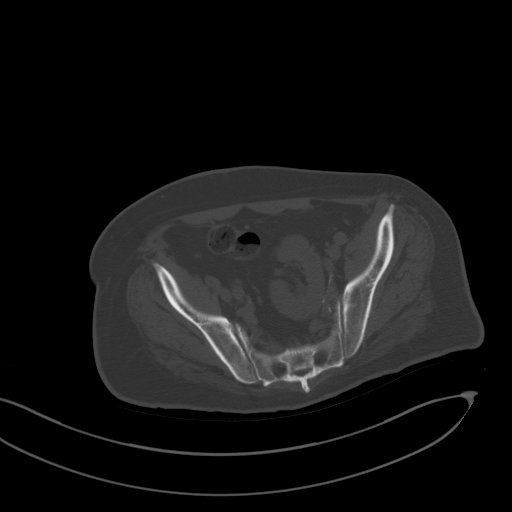
[im 37/51  soft-tissue]
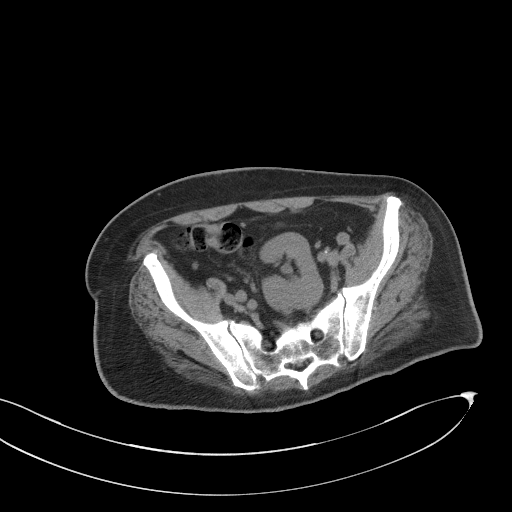
[im 39/51  soft-tissue]
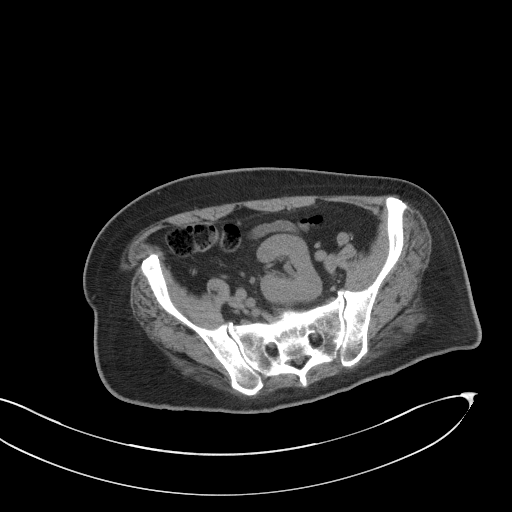
[im 39/51  lung]
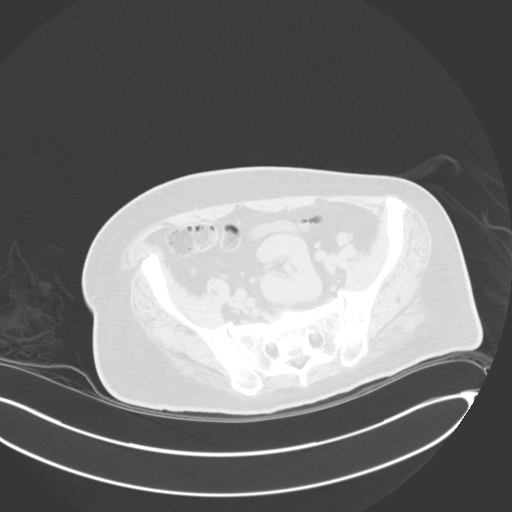
[im 42/51  lung]
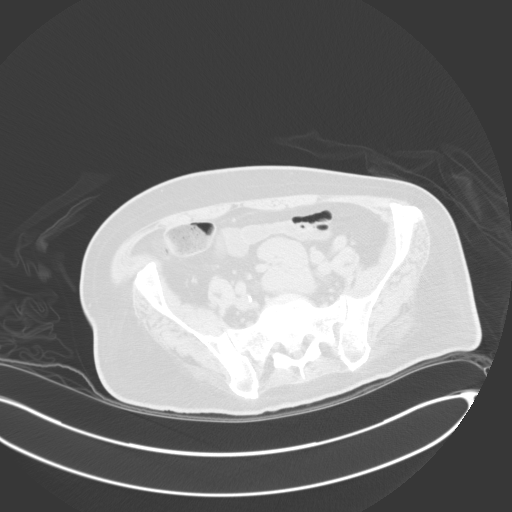
[im 45/51  soft-tissue]
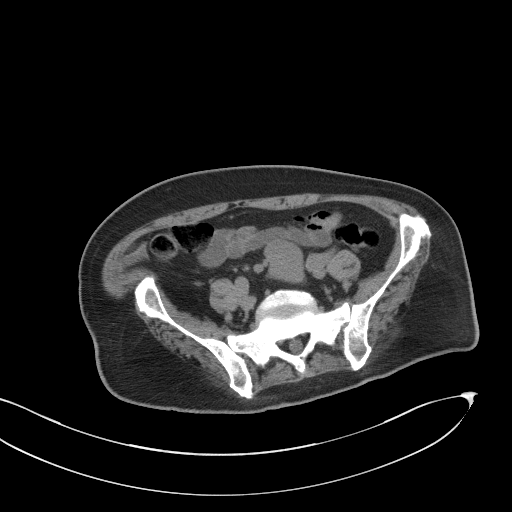
[im 45/51  lung]
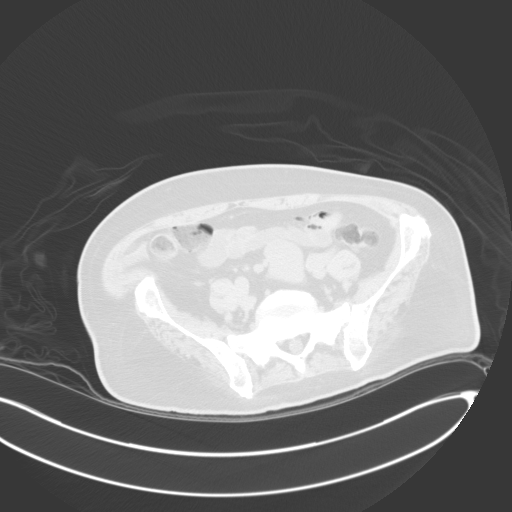
[im 48/51  soft-tissue]
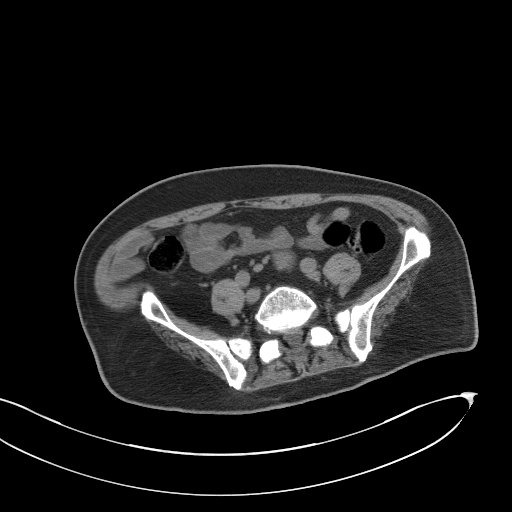
[im 48/51  lung]
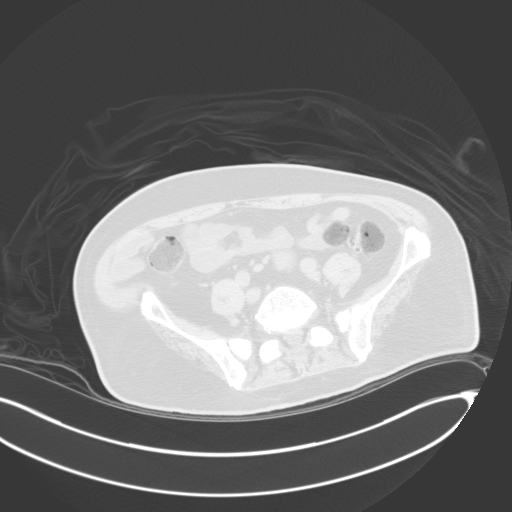

[14 of 32 positions shown; findings below may reference images not displayed]

ANESTHESIA/SEDATION:
Intravenous Fentanyl [9J] and Versed 1mg were administered as
conscious sedation during continuous monitoring of the patient's
level of consciousness and physiological / cardiorespiratory status
by the radiology RN, with a total moderate sedation time of 15
minutes.

PROCEDURE:
Informed written consent was obtained from the patient after a
thorough discussion of the procedural risks, benefits and
alternatives. All questions were addressed. Maximal Sterile Barrier
Technique was utilized including caps, mask, sterile gowns, sterile
gloves, sterile drape, hand hygiene and skin antiseptic. A timeout
was performed prior to the initiation of the procedure.

Select axial CT scans were obtained through the urinary bladder. An
appropriate skin entry site was determined and marked. The bladder
was distended with 300 mL sterile saline via extant Foley catheter.
Skin site was prepped with chlorhexidine, draped in usual sterile
fashion, infiltrated locally with 1% lidocaine.

Under intermittent CT fluoroscopic guidance, an 18 gauge trocar
needle was advanced into the urinary bladder. Urine returned through
the needle hub. Amplatz guidewire advanced easily, position
confirmed on CT. Tract dilated to facilitate placement of a 14
French pigtail catheter, formed within the lumen of the urinary
bladder. Position confirmed on CT. Catheter secured externally with
0 Prolene suture and StatLock and placed to gravity drain bag. The
patient tolerated the procedure well.

COMPLICATIONS:
None immediate.
IMPRESSION: 1. Technically successful CT-guided suprapubic catheter placement.

## 2021-03-23 MED ORDER — NALOXONE HCL 0.4 MG/ML IJ SOLN
INTRAMUSCULAR | Status: AC
  Filled 2021-03-23: qty 1

## 2021-03-23 MED ORDER — HYDROCODONE-ACETAMINOPHEN 5-325 MG PO TABS: 1.0000 | ORAL_TABLET | ORAL | Status: DC | PRN

## 2021-03-23 MED ORDER — MIDAZOLAM HCL 2 MG/2ML IJ SOLN
INTRAMUSCULAR | Status: AC
  Filled 2021-03-23: qty 4

## 2021-03-23 MED ORDER — LIDOCAINE HCL (PF) 1 % IJ SOLN
INTRAMUSCULAR | Status: AC | PRN
  Administered 2021-03-23: 10 mL via INTRADERMAL

## 2021-03-23 MED ORDER — CEFTRIAXONE SODIUM 1 G IJ SOLR
1.0000 g | Freq: Once | INTRAMUSCULAR | Status: DC
  Filled 2021-03-23: qty 10

## 2021-03-23 MED ORDER — FLUMAZENIL 0.5 MG/5ML IV SOLN
INTRAVENOUS | Status: AC
  Filled 2021-03-23: qty 5

## 2021-03-23 MED ORDER — FENTANYL CITRATE (PF) 100 MCG/2ML IJ SOLN
INTRAMUSCULAR | Status: AC | PRN
  Administered 2021-03-23: 50 ug via INTRAVENOUS

## 2021-03-23 MED ORDER — SODIUM CHLORIDE 0.9 % IV SOLN: INTRAVENOUS | Status: DC

## 2021-03-23 MED ORDER — SODIUM CHLORIDE 0.9 % IV SOLN
1.0000 g | Freq: Once | INTRAVENOUS | Status: DC
  Filled 2021-03-23: qty 10

## 2021-03-23 MED ORDER — MIDAZOLAM HCL 2 MG/2ML IJ SOLN
INTRAMUSCULAR | Status: AC | PRN
  Administered 2021-03-23: 1 mg via INTRAVENOUS

## 2021-03-23 MED ORDER — SODIUM CHLORIDE 0.9% FLUSH: 5.0000 mL | Freq: Three times a day (TID) | INTRAVENOUS | Status: DC

## 2021-03-23 MED ORDER — FENTANYL CITRATE (PF) 100 MCG/2ML IJ SOLN
INTRAMUSCULAR | Status: AC
  Filled 2021-03-23: qty 4

## 2021-03-23 NOTE — Discharge Instructions (Addendum)
   Please call Interventional Radiology clinic [phone] with any questions or concerns.

## 2021-03-23 NOTE — Sedation Documentation (Signed)
300 cc NS instilled into patient's bladder via foley catheter per Dr Deanne Coffer. Patient tolerated procedure well.

## 2021-03-23 NOTE — Procedures (Signed)
  Procedure: CT guided suprapubic catheter placement 59f EBL:   minimal Complications:  none immediate  See full dictation in [employer].  Thora Lance MD Main # [phone] Pager  [phone]

## 2021-03-23 NOTE — H&P (Addendum)
Chief Complaint: Patient was seen in consultation today for suprapubic catheter at the request of Daniel Cline,Daniel Cline  Referring Physician(s): Daniel Cline,Daniel Cline  Supervising Physician: Arne Cleveland  Patient Status: [facility]  History of Present Illness: Daniel Cline is a 65 y.o. male with neuromuscular dysfunction of the bladder and recurrent UTIs. He is referred for suprapubic catheter placement. Just completed abx for recent UTI PMHx, meds, labs, imaging, allergies reviewed. Feels well, no recent fevers, chills, illness. Has been NPO today as directed. Family at bedside.   Past Medical History:  Diagnosis Date   Diabetes mellitus without complication [facility])     Past Surgical History:  Procedure Laterality Date   I & D EXTREMITY Right 11/20/2020   Procedure: IRRIGATION AND DEBRIDEMENT FOREARM;  Surgeon: Nicholes Stairs, MD;  Location: [facility];  Service: Orthopedics;  Laterality: Right;   POSTERIOR CERVICAL LAMINECTOMY N/A 11/18/2020   Procedure: CERVICAL THREE-FOUR, CERVICAL FOUR-FIVE, CERVICAL FIVE-SIX, CERVICAL SIX-SEVEN CERVICAL LAMINECTOMY FOR EVACUATION OF ABSCESS;  Surgeon: Earnie Larsson, MD;  Location: [facility];  Service: Neurosurgery;  Laterality: N/A;    Allergies: Patient has no known allergies.  Medications: Prior to Admission medications   Medication Sig Start Date End Date Taking? Authorizing Provider  Accu-Chek Softclix Lancets lancets Use as directed. 01/01/21   Angiulli, Lavon Paganini, PA-C  acetaminophen (TYLENOL) 325 MG tablet Take 2 tablets (650 mg total) by mouth every 4 (four) hours. 11/30/20   Angiulli, Lavon Paganini, PA-C  amLODipine (NORVASC) 2.5 MG tablet Take 1 tablet (2.5 mg total) by mouth daily. Patient not taking: Reported on 01/29/2021 12/31/20   Angiulli, Lavon Paganini, PA-C  apixaban (ELIQUIS) 5 MG TABS tablet Take 1 tablet (5 mg total) by mouth 2 (two) times daily. 12/31/20   Angiulli, Lavon Paganini, PA-C  baclofen 5 MG TABS Take 5 mg by mouth 3 (three)  times daily. For spasticity 03/16/21   Lovorn, Jinny Blossom, MD  bisacodyl (DULCOLAX) 10 MG suppository Place 1 suppository (10 mg total) rectally daily as needed for moderate constipation. Patient not taking: Reported on 01/29/2021 11/30/20   Angiulli, Lavon Paganini, PA-C  bisacodyl (DULCOLAX) 10 MG suppository Place 1 suppository (10 mg total) rectally daily. Patient not taking: Reported on 01/29/2021 11/30/20   Angiulli, Lavon Paganini, PA-C  blood glucose meter kit and supplies Dispense based on patient and insurance preference. Use up to four times daily as directed. (FOR ICD-10 E10.9, E11.9). 01/01/21   Angiulli, Lavon Paganini, PA-C  Blood Glucose Monitoring Suppl (ACCU-CHEK GUIDE) Cline/Device KIT Use as directed 01/01/21   Angiulli, Lavon Paganini, PA-C  busPIRone (BUSPAR) 10 MG tablet Take 0.5 tablets (5 mg total) by mouth 3 (three) times daily. Patient not taking: Reported on 01/29/2021 12/31/20   Angiulli, Lavon Paganini, PA-C  diazepam (VALIUM) 2 MG tablet Take 0.5 tablets (1 mg total) by mouth 2 (two) times daily. Patient not taking: Reported on 01/29/2021 12/31/20   Angiulli, Lavon Paganini, PA-C  ferrous sulfate 325 (65 FE) MG tablet Take 1 tablet (325 mg total) by mouth daily with breakfast. Patient not taking: Reported on 01/29/2021 11/30/20   Angiulli, Lavon Paganini, PA-C  glucose blood (ACCU-CHEK GUIDE) test strip Use as instructed 01/01/21   Angiulli, Lavon Paganini, PA-C  hydrochlorothiazide (HYDRODIURIL) 12.5 MG tablet Take 1 tablet (12.5 mg total) by mouth daily. 12/31/20   Angiulli, Lavon Paganini, PA-C  insulin detemir (LEVEMIR) 100 UNIT/ML FlexPen Inject 18 Units into the skin at bedtime. 12/31/20   Angiulli, Lavon Paganini, PA-C  lidocaine (LIDODERM)  5 % Place 3 patches onto the skin daily. Remove & Discard patch within 12 hours or as directed by MD Patient not taking: Reported on 01/29/2021 12/31/20   Angiulli, Lavon Paganini, PA-C  lisinopril (ZESTRIL) 5 MG tablet Take 1 tablet (5 mg total) by mouth at bedtime. 12/31/20   Angiulli, Lavon Paganini, PA-C  liver oil-zinc oxide (DESITIN)  40 % ointment Apply topically 2 (two) times daily. Patient not taking: Reported on 01/29/2021 11/30/20   Angiulli, Lavon Paganini, PA-C  metoprolol tartrate (LOPRESSOR) 50 MG tablet Take 1 tablet (50 mg total) by mouth 2 (two) times daily. Patient not taking: Reported on 01/29/2021 11/30/20   Angiulli, Lavon Paganini, PA-C  morphine (MS CONTIN) 15 MG 12 hr tablet Take 1 tablet (15 mg total) by mouth daily. Patient not taking: Reported on 01/29/2021 12/31/20   Cathlyn Parsons, PA-C  Multiple Vitamin (MULTIVITAMIN WITH MINERALS) TABS tablet Take 1 tablet by mouth daily. 12/31/20   Angiulli, Lavon Paganini, PA-C  ondansetron (ZOFRAN) 4 MG tablet Take 1 tablet (4 mg total) by mouth every 6 (six) hours as needed for nausea or vomiting. Patient not taking: Reported on 01/29/2021 11/30/20   Angiulli, Lavon Paganini, PA-C  oxyCODONE (OXY IR/ROXICODONE) 5 MG immediate release tablet Take 1-2 tablets (5-10 mg total) by mouth every 4 (four) hours as needed for severe pain or breakthrough pain. Patient not taking: Reported on 01/29/2021 12/31/20   Angiulli, Lavon Paganini, PA-C  polyethylene glycol (MIRALAX / GLYCOLAX) 17 g packet Take 17 g by mouth 2 (two) times daily. Patient not taking: Reported on 01/29/2021 12/31/20   Angiulli, Lavon Paganini, PA-C  promethazine (PHENERGAN) 12.5 MG tablet Take 1 tablet (12.5 mg total) by mouth every 6 (six) hours as needed for nausea or vomiting. 03/16/21   Lovorn, Jinny Blossom, MD  senna-docusate (SENOKOT-S) 8.6-50 MG tablet Take 1 tablet by mouth 2 (two) times daily. Patient not taking: Reported on 01/29/2021 11/30/20   Angiulli, Lavon Paganini, PA-C  senna-docusate (SENOKOT-S) 8.6-50 MG tablet Take 2 tablets by mouth 2 (two) times daily. Patient not taking: Reported on 01/29/2021 12/31/20   Cathlyn Parsons, PA-C  sulfamethoxazole-trimethoprim (BACTRIM DS) 800-160 MG tablet Take 1 tablet by mouth 2 (two) times daily. 03/03/21   Meredith Staggers, MD  tamsulosin (FLOMAX) 0.4 MG CAPS capsule Take 2 capsules (0.8 mg total) by mouth daily after  supper. 12/31/20   Angiulli, Lavon Paganini, PA-C     History reviewed. No pertinent family history.  Social History   Socioeconomic History   Marital status: Single    Spouse name: Not on file   Number of children: Not on file   Years of education: Not on file   Highest education level: Not on file  Occupational History   Not on file  Tobacco Use   Smoking status: Never   Smokeless tobacco: Never  Substance and Sexual Activity   Alcohol use: Yes    Comment: occ   Drug use: Never   Sexual activity: Not Currently  Other Topics Concern   Not on file  Social History Narrative   Not on file   Social Determinants of Health   Financial Resource Strain: Not on file  Food Insecurity: Not on file  Transportation Needs: Not on file  Physical Activity: Not on file  Stress: Not on file  Social Connections: Not on file    Review of Systems: A 12 point ROS discussed and pertinent positives are indicated in the HPI above.  All other systems  are negative.  Review of Systems  Vital Signs: BP (P) 109/79   Pulse (!) (P) 110   Resp (P) 17   SpO2 (P) 98%   Physical Exam Constitutional:      Appearance: Normal appearance. He is not ill-appearing.  HENT:     Mouth/Throat:     Mouth: Mucous membranes are moist.     Pharynx: Oropharynx is clear.  Cardiovascular:     Rate and Rhythm: Normal rate and regular rhythm.     Heart sounds: Normal heart sounds.  Pulmonary:     Effort: Pulmonary effort is normal. No respiratory distress.     Breath sounds: Normal breath sounds.  Abdominal:     General: Abdomen is flat.     Palpations: Abdomen is soft.  Neurological:     General: No focal deficit present.     Mental Status: He is alert and oriented to person, place, and time.  Psychiatric:        Mood and Affect: Mood normal.        Thought Content: Thought content normal.     Imaging: No results found.  Labs:  CBC: Recent Labs    12/14/20 0504 12/21/20 0409 12/28/20 0629  12/31/20 0548  WBC 9.3 10.8* 10.4 9.7  HGB 10.3* 10.9* 11.0* 12.0*  HCT 31.9* 33.9* 35.3* 36.6*  PLT 442* 345 331 346    COAGS: Recent Labs    11/30/20 0733  APTT 49*    BMP: Recent Labs    12/21/20 0409 12/28/20 0629 12/30/20 1026 12/31/20 0548  NA 134* 134* 134* 136  K 3.7 3.3* 3.3* 3.2*  CL 100 97* 101 104  CO2 _0 GLUCOSE 106* 96 146* 111*  BUN _1 CALCIUM 9.1 9.1 8.9 9.2  CREATININE 0.79 0.82 0.79 0.81  GFRNONAA >60 >60 >60 >60    LIVER FUNCTION TESTS: Recent Labs    12/14/20 0504 12/21/20 0409 12/28/20 0629 12/31/20 0548  BILITOT 0.5 0.6 0.4 0.5  AST _2 ALT _3 ALKPHOS 128* 117 92 96  PROT 6.8 7.1 7.0 6.9  ALBUMIN 2.3* 2.6* 2.7* 2.9*    TUMOR MARKERS: No results for input(s): AFPTM, CEA, CA199, CHROMGRNA in the last 8760 hours.  Assessment and Plan: Neurogenic bladder Frequent UTIs due to indwelling bladder catheter For image guided SP tube placement today Risks and benefits discussed with the patient including bleeding, infection, damage to adjacent structures, and sepsis.  All of the patient's questions were answered, patient is agreeable to proceed. Consent signed and in chart.    Thank you for this interesting consult.  I greatly enjoyed meeting CLIMMIE BUELOW Cline and look forward to participating in their care.  A copy of this report was sent to the requesting provider on this date.  Electronically Signed: Ascencion Dike, PA-C 03/23/2021, 8:33 AM   I spent a total of 20 minutes in face to face in clinical consultation, greater than 50% of which was counseling/coordinating care for SP tube placement

## 2021-03-25 ENCOUNTER — Telehealth: Payer: Self-pay

## 2021-03-25 NOTE — Telephone Encounter (Signed)
Elsa with [facility] called to report [name], will not have home visits this week. Alba Cory will reach out next week for home visits. It is because of [facility] scheduling issues.   Alba Cory / [facility] ph# [phone].

## 2021-03-26 NOTE — Telephone Encounter (Signed)
OK- thanks- ML

## 2021-04-09 ENCOUNTER — Encounter: Payer: Self-pay | Attending: Registered Nurse | Admitting: Physical Medicine and Rehabilitation

## 2021-04-09 ENCOUNTER — Encounter: Payer: Self-pay | Admitting: Physical Medicine and Rehabilitation

## 2021-04-09 ENCOUNTER — Other Ambulatory Visit: Payer: Self-pay

## 2021-04-09 VITALS — BP 106/74 | HR 119 | Temp 98.0°F | Ht 67.0 in | Wt 146.0 lb

## 2021-04-09 DIAGNOSIS — N319 Neuromuscular dysfunction of bladder, unspecified: Secondary | ICD-10-CM | POA: Insufficient documentation

## 2021-04-09 DIAGNOSIS — Z993 Dependence on wheelchair: Secondary | ICD-10-CM | POA: Insufficient documentation

## 2021-04-09 DIAGNOSIS — R252 Cramp and spasm: Secondary | ICD-10-CM | POA: Insufficient documentation

## 2021-04-09 DIAGNOSIS — G8250 Quadriplegia, unspecified: Secondary | ICD-10-CM | POA: Insufficient documentation

## 2021-04-09 NOTE — Patient Instructions (Addendum)
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 Neurogenic bowel and bladder- with chronic foley, as well as pain- also has DM- last A1c of 13.9, HTN with orthostatic hypotension;  anxiety/depression , Acute DVT here for f/u on SCI.   More pain/spasms with UTI- so can increase baclofen to 5 mg 4x/day (5 mg in Am and lunch and 10 mg at bedtime)  OR 10 mg 3x/day. But usually use Baclofen 5 mg 3x/day.   2. Went  over bladder colonization and bladder UTIs- can make him really sick! Being sick is the signal not bad smelling urine- drink more water- other Symptoms that tell might have uti- urinary incontinence, through penis,  spasticity through the roof; and fever or acting ill.   3. Con't Baclofen 5 mg 3x/day in general.   4. Pill for bowel- senokot/generic; as needed .   5.  Oxybutynin from Urology-   6. BP doing better- doing great with just Lisinopril- off HCTZ.  Con't regimen  7. F/U in 3 months  8. Cranberry pills- try it to reduce UTI risk.   9. An use voltaren gel- for hands- rubs in hands- can give tylenol as well or 1 aleve 250 mg up to 4 days/week.

## 2021-04-09 NOTE — Progress Notes (Signed)
Subjective:    Patient ID: Daniel Cline, male    DOB: [birth year], 65 y.o.   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 Neurogenic bowel and bladder- with chronic foley, as well as pain- also has DM- last A1c of 13.9, HTN with orthostatic hypotension;  anxiety/depression , Acute DVT here for f/u on SCI.    Nausea is great- was the UTI- and that's gone now. Once on right ABX, nausea and appetite back to normal.   Appetite back and drinking better- has SPC now- got 2 weeks ago- Through [facility]- Was placed by IR.   That's going well. The [facility]- was having some issues initially- ended up getting clogged! Was peeing through penis- and rinsed tubing and now fine.   Now on Oxybutynin- and was having bladder spasms- but now ok. Pushing water.   Is 147 lbs.   Walking with therapy- H/H- 20 ft and 20 ft- so 40 ft at a time- with RW.   Pain/spasms of L shoulder- doing 5 mg  (too out of it on 10 mg TID)- 3x/day.    No suppository regularly- 1x every 2 weeks- miralax doesn't work-  Gets sticky;    Pressure ulcer healed- from hospital.  Incision looks great- per daughter rin law.   Pain Inventory Average Pain 2 Pain Right Now 2 My pain is aching  In the last 24 hours, has pain interfered with the following? General activity 0 Relation with others 0 Enjoyment of life 1 What TIME of day is your pain at its worst? daytime and evening Sleep (in general) NA  Pain is worse with: unsure Pain improves with: heat/ice and therapy/exercise Relief from Meds:  n/a  No family history on file. Social History   Socioeconomic History   Marital status: Single    Spouse name: Not on file   Number of children: Not on file   Years of education: Not on file   Highest education level: Not on file  Occupational History   Not on file  Tobacco Use   Smoking status: Never   Smokeless tobacco:  Never  Substance and Sexual Activity   Alcohol use: Yes    Comment: occ   Drug use: Never   Sexual activity: Not Currently  Other Topics Concern   Not on file  Social History Narrative   Not on file   Social Determinants of Health   Financial Resource Strain: Not on file  Food Insecurity: Not on file  Transportation Needs: Not on file  Physical Activity: Not on file  Stress: Not on file  Social Connections: Not on file   Past Surgical History:  Procedure Laterality Date   I & D EXTREMITY Right 11/20/2020   Procedure: IRRIGATION AND DEBRIDEMENT FOREARM;  Surgeon: Yolonda Kida, MD;  Location: [facility] OR;  Service: Orthopedics;  Laterality: Right;   POSTERIOR CERVICAL LAMINECTOMY N/A 11/18/2020   Procedure: CERVICAL THREE-FOUR, CERVICAL FOUR-FIVE, CERVICAL FIVE-SIX, CERVICAL SIX-SEVEN CERVICAL LAMINECTOMY FOR EVACUATION OF ABSCESS;  Surgeon: Julio Sicks, MD;  Location: [facility] OR;  Service: Neurosurgery;  Laterality: N/A;   Past Surgical History:  Procedure Laterality Date   I & D EXTREMITY Right 11/20/2020   Procedure: IRRIGATION AND DEBRIDEMENT FOREARM;  Surgeon: Yolonda Kida, MD;  Location: [facility] OR;  Service: Orthopedics;  Laterality: Right;   POSTERIOR CERVICAL LAMINECTOMY N/A 11/18/2020   Procedure: CERVICAL THREE-FOUR, CERVICAL FOUR-FIVE,  CERVICAL FIVE-SIX, CERVICAL SIX-SEVEN CERVICAL LAMINECTOMY FOR EVACUATION OF ABSCESS;  Surgeon: Julio Sicks, MD;  Location: [facility] OR;  Service: Neurosurgery;  Laterality: N/A;   Past Medical History:  Diagnosis Date   Diabetes mellitus without complication (HCC)    BP 106/74   Pulse (!) 119   Temp 98 F (36.7 C)   Ht 5\' 7"  (1.702 m)   Wt 146 lb (66.2 kg)   SpO2 97%   BMI 22.87 kg/m   Opioid Risk Score:   Fall Risk Score:  `1  Depression screen PHQ 2/9  Depression screen PHQ 2/9 01/29/2021  Decreased Interest 0  Down, Depressed, Hopeless 0  PHQ - 2 Score 0  Altered sleeping 1  Tired, decreased energy 1  Change in appetite 0   Feeling bad or failure about yourself  0  Trouble concentrating 0  Moving slowly or fidgety/restless 1  Suicidal thoughts 0  PHQ-9 Score 3  Difficult doing work/chores Not difficult at all    Review of Systems  Constitutional: Negative.   HENT: Negative.    Eyes: Negative.   Gastrointestinal: Negative.   Endocrine: Negative.   Genitourinary: Negative.   Musculoskeletal:  Positive for arthralgias and gait problem.  Skin: Negative.   Allergic/Immunologic: Negative.   Hematological: Negative.   Psychiatric/Behavioral: Negative.    All other systems reviewed and are negative.     Objective:   Physical Exam Awake, alert, appropriate, accompanied by daughter in law; sitting UP in w/c- mildly tilt in w/c; NAD Has SPC- light amber urine  Neuro: MAS of 1+ in L shoulder external rotation- and flexion- but less than before- a little fighting me;  MAS of 1 in RUE/shoulder.  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 Neurogenic bowel and bladder- with chronic foley, as well as pain- also has DM- last A1c of 13.9, HTN with orthostatic hypotension;  anxiety/depression , Acute DVT here for f/u on SCI.   More pain/spasms with UTI- so can increase baclofen to 5 mg 4x/day (5 mg in Am and lunch and 10 mg at bedtime)  OR 10 mg 3x/day. But usually use Baclofen 5 mg 3x/day.   2. Went  over bladder colonization and bladder UTIs- can make him really sick! Being sick is the signal not bad smelling urine- drink more water- other Symptoms that tell might have uti- urinary incontinence, through penis,  spasticity through the roof; and fever or acting ill.   3. Con't Baclofen 5 mg 3x/day in general.   4. Pill for bowel- senokot/generic; as needed .   5.  Oxybutynin from Urology-   6. BP doing better- doing great with just Lisinopril- off HCTZ.  Con't regimen  7. F/U in 3 months  8 Try to  reduce UTI risk.With cranberry pills.    9. An use voltaren gel- for hands- rubs in hands- can give tylenol as well or 1 aleve 250 mg up to 4 days/week.   I spent a total of 26 minutes on visit; discussing colonization and spasticity and UTI's

## 2021-07-09 ENCOUNTER — Encounter: Payer: Self-pay | Attending: Registered Nurse | Admitting: Physical Medicine and Rehabilitation

## 2021-07-09 DIAGNOSIS — G8250 Quadriplegia, unspecified: Secondary | ICD-10-CM | POA: Insufficient documentation

## 2021-07-09 DIAGNOSIS — N319 Neuromuscular dysfunction of bladder, unspecified: Secondary | ICD-10-CM | POA: Insufficient documentation

## 2021-07-09 DIAGNOSIS — R252 Cramp and spasm: Secondary | ICD-10-CM | POA: Insufficient documentation

## 2021-07-09 DIAGNOSIS — Z993 Dependence on wheelchair: Secondary | ICD-10-CM | POA: Insufficient documentation

## 2022-01-13 ENCOUNTER — Encounter: Admit: 2022-01-13 | Discharge: 2022-01-13 | Payer: MEDICARE

## 2022-01-13 DIAGNOSIS — K56609 Unspecified intestinal obstruction, unspecified as to partial versus complete obstruction: Secondary | ICD-10-CM

## 2022-01-13 DIAGNOSIS — C179 Malignant neoplasm of small intestine, unspecified: Secondary | ICD-10-CM

## 2022-01-13 DIAGNOSIS — D3A098 Benign carcinoid tumors of other sites: Secondary | ICD-10-CM

## 2022-01-13 DIAGNOSIS — E785 Hyperlipidemia, unspecified: Secondary | ICD-10-CM

## 2022-01-13 DIAGNOSIS — K219 Gastro-esophageal reflux disease without esophagitis: Secondary | ICD-10-CM

## 2022-01-13 DIAGNOSIS — K589 Irritable bowel syndrome without diarrhea: Secondary | ICD-10-CM

## 2022-01-13 DIAGNOSIS — R7989 Other specified abnormal findings of blood chemistry: Secondary | ICD-10-CM

## 2022-01-13 NOTE — Progress Notes
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 male.    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   He has been under the care of Dr. Jorene Guest who is retired and hence he established with me on 01/13/2022.  I reviewed all his prior records since 2014.  His last scans in 2021 were excellent and we are only planning further scans if symptoms or lab changes.    He denies any abdominal pain nausea or vomiting.  No fevers chills or night sweats.  No loss of weight or loss of appetite.    He is accompanied by his wife.  They are very involved with their 5 grandchildren who live locally.    Medical History:   Diagnosis Date   ? Acid reflux    ? Bowel obstruction (HCC)    ? Hyperlipemia    ? Irritable bowel disease    ? 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 HX ENDOSCOPY  2013    colonoscopy/egd [facility]   ? HX BOWEL RESECTION  01/22/13    small bowel   ? HX KNEE SURGERY Right 03-2014    arthroscopic   Dr. Bing Ree   ? COLONOSCOPY DIAGNOSTIC WITH SPECIMEN COLLECTION BY BRUSHING/ WASHING - FLEXIBLE N/A 04/23/2020    Performed by Barbie Banner, MD at [facility] OR   ? HX TONSILLECTOMY      as a child     Family History   Problem Relation Age of Onset   ? Alzheimer's Mother    ? Alzheimer's Father    ? Colon Polyps Father    ? Cancer-Lung Maternal Grandfather    ? Cancer-Colon Neg Hx    ? Inflammatory Bowel Disease Neg Hx    ? Rectal Cancer Neg Hx    ? Ulcerative Colitis Neg Hx    ? GI Cancer Neg Hx      Social History     Socioeconomic History   ? Marital status: Married   Occupational History     Employer: [employer]   Tobacco Use   ? Smoking status: Never   ? Smokeless tobacco: Never   Substance and Sexual Activity   ? Alcohol use: No   ? Drug use: No                        Review of Systems   Constitutional: Negative.    HENT: Negative.    Eyes: Negative.    Respiratory: Negative.    Cardiovascular: Negative.    Gastrointestinal: Negative.    Genitourinary: Negative.    Musculoskeletal: Negative.    Skin: Negative.    Neurological: Negative.    Hematological: Negative.    Psychiatric/Behavioral: Negative.          Objective:         ? acetaminophen (TYLENOL) 500 mg tablet Take two tablets by mouth every 6 hours as needed.   ? alfuzosin (UROXATRAL) 10 mg tablet    ? COQ10 (LIPOSOMAL UBIQUINOL) PO Take  by mouth.   ? famotidine (PEPCID) 20 mg tablet Take  one tablet by mouth daily as needed.   ? IBUPROFEN (ADVIL PO) Take 200 mg by mouth. Patient takes three tabs at a time for a total dose of 600mg  as needed for pain   ? mirabegron (MYRBETRIQ) 50 mg ER tablet Take  by mouth daily.   ? peg-electrolyte solution (NULYTELY) 420 gram oral solution Mix as directed on package. Refrigerate once mixed. Do not mix greater than 24 hours prior to procedure. Drink 3/4 of bottle between 5pm and 7 pm the night before procedure. Drink remaining 1/4 of bottle 5 hours prior to procedure   ? polyethylene glycol 3350 (GLYCOLAX; MIRALAX) 17 gram/dose powder Take 17 g by mouth daily as needed (Constipation).   ? tamsulosin (FLOMAX) 0.4 mg capsule Take one capsule by mouth daily.   ? vitamins, multiple cap Take one capsule by mouth daily.     Vitals:    01/13/22 0820   BP: 129/79   BP Source: Arm, Left Upper   Pulse: 65   Temp: 36.5 ?C (97.7 ?F)   Resp: 20   SpO2: 96%   TempSrc: Temporal   PainSc: Zero   Weight: 103.8 kg (228 lb 12.8 oz)   Height: 175.3 cm (5' 9)  Comment: copied     Body mass index is 33.79 kg/m?Marland Kitchen     Physical Exam  Vitals reviewed.   Constitutional:       Appearance: He is well-developed.   HENT:      Head: Normocephalic and atraumatic.   Eyes:      Pupils: Pupils are equal, round, and reactive to light.   Cardiovascular: Rate and Rhythm: Normal rate.      Heart sounds: Normal heart sounds.   Pulmonary:      Effort: Pulmonary effort is normal.      Breath sounds: Normal breath sounds.   Abdominal:      Palpations: Abdomen is soft. There is no mass.      Tenderness: There is no abdominal tenderness.   Musculoskeletal:         General: No tenderness.      Cervical back: Neck supple.   Skin:     General: Skin is warm and dry.      Findings: No rash.   Neurological:      Mental Status: He is alert and oriented to person, place, and time.              Assessment and Plan:    1. Small-bowel carcinoid status post initial surgical resection Jan 22, 2013, with persistent abnormality and octreotide scan consistent with mesenteric mass/lymph node with resection by Dr. Rainey Pines in December, 2014, revealing carcinoid. His followup CT scans and octreotide scans have been normal.  He is asymptomatic. There was no evidence of disease on his  octreotide scan 12/31/2019.     2.  Elevated liver function test, new.  AST is 37 on 4 10/15/2021 and ALT 55.  I will proceed with CT chest abdomen and pelvis for further evaluation.  I discussed the risk of recurrence with carcinoid and the risk of liver metastasis.  He understands the need for restaging work-up.    3. He had a concerning skin lesion of the right temple region when Dr. Willette Pa saw him for his last checkup. He saw dermatologist Dr. Jena Gauss in [place] and this was a basal cell carcinoma which was resected.  He also had some other skin lesions evaluated.    CBC w diff  CBC with Diff  Latest Ref Rng & Units 12/13/2021 12/30/2020 12/10/2019 10/29/2018 11/07/2017   WBC 3.8 - 10.8 Thousand/uL 7.3 11.0(H) 8.6 9.3 7.8   RBC 4.20 - 5.80 Million/uL 5.60 5.68 5.54 5.23 5.39   HGB 13.2 - 17.1 g/dL 16.1 09.6 04.5 40.9 81.1   HCT 38.5 - 50.0 % 46.6 47.0 47.6 45.1 45.6   MCV 80.0 - 100.0 fL 83.2 82.7 85.9 86.2 84.6   MCH 27.0 - 33.0 pg 27.7 27.5 28.5 28.5 29.1   MCHC 32.0 - 36.0 g/dL 91.4 78.2 95.6 21.3 08.6 RDW 11.0 - 15.0 % 13.2 12.9 13.4 12.8 12.9   PLT 140 - 400 Thousand/uL 312 294 295 297 297   MPV 7.5 - 12.5 fL 9.5 9.3 9.3 9.3 9.4   NEUT % 65.1 72.9 60.1 63.3 58.4   ANC 1500 - 7800 cells/uL 4752 8019(H) 5169 5887 4555   LYMA % 23.9 18.4 27.9 26.3 28.8   ALYM 850 - 3900 cells/uL 1745 2024 2399 2446 2246   MONA % 8.0 7.0 9.9 8.1 9.3   AMONO 200 - 950 cells/uL 584 770 851 753 725   EOSA % 2.3 1.3 1.6 2.0 3.0   AEOS 15 - 500 cells/uL 168 143 138 186 234   BASA % 0.7 0.4 0.5 0.3 0.5   ABAS 0 - 200 cells/uL 51 44 43 28 39     Comprehensive Metabolic Profile  CMP Latest Ref Rng & Units 12/13/2021 12/30/2020 12/10/2019 10/29/2018 11/07/2017   NA 135 - 146 mmol/L 137 139 137 139 137   K 3.5 - 5.3 mmol/L 4.5 4.6 4.9 4.4 4.4   CL 98 - 110 mmol/L 103 101 100 101 100   CO2 20 - 32 mmol/L 27 30 29  32 25   GAP 3 - 12 - - - - -   BUN 7 - 25 mg/dL 10 9 13 10 10    CR 0.70 - 1.35 mg/dL 5.78(I) 6.96 2.95 2.84 0.76   GLUX - - - - - -   CA 8.6 - 10.3 mg/dL 9.8 9.5 13.2 9.7 9.6   TP 6.1 - 8.1 g/dL 7.6 7.3 7.9 7.1 7.1   ALB 3.6 - 5.1 g/dL 4.5 4.4 4.6 4.2 4.3   ALKP 35 - 144 U/L 86 77 68 89 67   ALT 9 - 46 U/L 55(H) 33 28 19 32   TBILI 0.2 - 1.2 mg/dL 0.5 0.6 0.8 0.6 0.7   GFR > OR = 60 mL/min/1.24m2 102 95 93 97 98   GFRAA > OR = 60 mL/min/1.65m2 - 110 107 112 113                       Total Time Today was 45 minutes in the following activities: Preparing to see the patient, Obtaining and/or reviewing separately obtained history, Performing a medically appropriate examination and/or evaluation, Counseling and educating the patient/family/caregiver, Ordering medications, tests, or procedures, Documenting clinical information in the electronic or other health record and Care coordination (not separately reported)

## 2022-01-25 ENCOUNTER — Encounter: Admit: 2022-01-25 | Discharge: 2022-01-25 | Payer: MEDICARE

## 2022-01-25 DIAGNOSIS — D3A098 Benign carcinoid tumors of other sites: Secondary | ICD-10-CM

## 2022-01-25 DIAGNOSIS — R7989 Other specified abnormal findings of blood chemistry: Secondary | ICD-10-CM

## 2022-01-26 ENCOUNTER — Encounter: Admit: 2022-01-26 | Discharge: 2022-01-26 | Payer: MEDICARE

## 2022-01-26 DIAGNOSIS — D3A098 Benign carcinoid tumors of other sites: Secondary | ICD-10-CM

## 2022-01-26 NOTE — Progress Notes
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 [name].    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   He has been under the care of Dr. Jorene Guest who is retired and hence he established with me on 01/13/2022.  I reviewed all his prior records since 2014.  His last scans in 2021 were excellent and we are only planning further scans if symptoms or lab changes.    He denies any abdominal pain nausea or vomiting.  No fevers chills or night sweats.  No loss of weight or loss of appetite.    He is accompanied by his wife.  They are very involved with their 5 grandchildren who live locally.    Interval History: No fever chills or night sweats.  No chest pain or shortness of breath.  No nausea vomiting.    Medical History:   Diagnosis Date   ? Acid reflux    ? Bowel obstruction (HCC)    ? Hyperlipemia    ? Irritable bowel disease    ? 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 HX ENDOSCOPY  2013    colonoscopy/egd [facility]   ? HX BOWEL RESECTION  01/22/13    small bowel   ? HX KNEE SURGERY Right 03-2014    arthroscopic   Dr. Bing Ree   ? COLONOSCOPY DIAGNOSTIC WITH SPECIMEN COLLECTION BY BRUSHING/ WASHING - FLEXIBLE N/A 04/23/2020    Performed by Barbie Banner[facility] at [facility] OR   ? HX TONSILLECTOMY      as a child     Family History   Problem Relation Age of Onset   ? Alzheimer's Mother    ? Alzheimer's Father    ? Colon Polyps Father    ? Cancer-Lung Maternal Grandfather    ? Cancer-Colon Neg Hx    ? Inflammatory Bowel Disease Neg Hx    ? Rectal Cancer Neg Hx    ? Ulcerative Colitis Neg Hx    ? GI Cancer Neg Hx      Social History     Socioeconomic History   ? Marital status: Married   Occupational History     Employer: [employer]   Tobacco Use   ? Smoking status: Never   ? Smokeless tobacco: Never Substance and Sexual Activity   ? Alcohol use: No   ? Drug use: No                        Review of Systems   Constitutional: Negative.    HENT: Negative.    Eyes: Negative.    Respiratory: Negative.    Cardiovascular: Negative.    Gastrointestinal: Negative.    Genitourinary: Negative.    Musculoskeletal: Negative.    Skin: Negative.    Neurological: Negative.    Hematological: Negative.    Psychiatric/Behavioral: Negative.          Objective:         ? acetaminophen (TYLENOL) 500 mg tablet Take two tablets by mouth every 6 hours as needed.   ? alfuzosin (UROXATRAL) 10 mg tablet    ? COQ10 (LIPOSOMAL UBIQUINOL) PO Take  by mouth.   ?  famotidine (PEPCID) 20 mg tablet Take one tablet by mouth daily as needed.   ? IBUPROFEN (ADVIL PO) Take 200 mg by mouth. Patient takes three tabs at a time for a total dose of 600mg  as needed for pain   ? mirabegron (MYRBETRIQ) 50 mg ER tablet Take  by mouth daily.   ? peg-electrolyte solution (NULYTELY) 420 gram oral solution Mix as directed on package. Refrigerate once mixed. Do not mix greater than 24 hours prior to procedure. Drink 3/4 of bottle between 5pm and 7 pm the night before procedure. Drink remaining 1/4 of bottle 5 hours prior to procedure   ? polyethylene glycol 3350 (GLYCOLAX; MIRALAX) 17 gram/dose powder Take 17 g by mouth daily as needed (Constipation).   ? tamsulosin (FLOMAX) 0.4 mg capsule Take one capsule by mouth daily.   ? vitamins, multiple cap Take one capsule by mouth daily.     There were no vitals filed for this visit.  There is no height or weight on file to calculate BMI.          Assessment and Plan:    1. Small-bowel carcinoid status post initial surgical resection Jan 22, 2013, with persistent abnormality and octreotide scan consistent with mesenteric mass/lymph node with resection by Dr. Rainey Pines in December, 2014, revealing carcinoid. His followup CT scans and octreotide scans have been normal.  He is asymptomatic. There was no evidence of disease on his  octreotide scan 12/31/2019.     CT scan of the chest abdomen pelvis on 01/25/2022 reveals no evidence of recurrent carcinoid tumor.  Prominent prostatomegaly.  Cholelithiasis.  Small ventral hernia containing nondilated/nonobstructed small bowel loop.  Mild thickening of the wall of the urinary bladder which could be related to cystitis versus chronic bladder outlet obstruction.    Plan labs and tele visit in 3 months in view of elevated LFTs.    2.  Elevated liver function test, new.  AST is 37 on 4 10/15/2021 and ALT 55.    CT scan of the chest abdomen pelvis on 01/25/2022 reveals no evidence of recurrent carcinoid tumor.  Prominent prostatomegaly.  Cholelithiasis.  Small ventral hernia containing nondilated/nonobstructed small bowel loop.  Mild thickening of the wall of the urinary bladder which could be related to cystitis versus chronic bladder outlet obstruction.    3. He had a concerning skin lesion of the right temple region when Dr. Willette Pa saw him for his last checkup. He saw dermatologist Dr. Jena Gauss in [place] and this was a basal cell carcinoma which was resected.  He also had some other skin lesions evaluated.    CBC w diff  CBC with Diff Latest Ref Rng & Units 12/13/2021 12/30/2020 12/10/2019 10/29/2018 11/07/2017   WBC 3.8 - 10.8 Thousand/uL 7.3 11.0(H) 8.6 9.3 7.8   RBC 4.20 - 5.80 Million/uL 5.60 5.68 5.54 5.23 5.39   HGB 13.2 - 17.1 g/dL 16.1 09.6 04.5 40.9 81.1   HCT 38.5 - 50.0 % 46.6 47.0 47.6 45.1 45.6   MCV 80.0 - 100.0 fL 83.2 82.7 85.9 86.2 84.6   MCH 27.0 - 33.0 pg 27.7 27.5 28.5 28.5 29.1   MCHC 32.0 - 36.0 g/dL 91.4 78.2 95.6 21.3 08.6   RDW 11.0 - 15.0 % 13.2 12.9 13.4 12.8 12.9   PLT 140 - 400 Thousand/uL 312 294 295 297 297   MPV 7.5 - 12.5 fL 9.5 9.3 9.3 9.3 9.4   NEUT % 65.1 72.9 60.1 63.3 58.4   ANC 1500 - 7800 cells/uL  4752 8019(H) 5169 5887 4555   LYMA % 23.9 18.4 27.9 26.3 28.8   ALYM 850 - 3900 cells/uL 1745 2024 2399 2446 2246   MONA % 8.0 7.0 9.9 8.1 9.3   AMONO 200 - 950 cells/uL 584 770 851 753 725   EOSA % 2.3 1.3 1.6 2.0 3.0   AEOS 15 - 500 cells/uL 168 143 138 186 234   BASA % 0.7 0.4 0.5 0.3 0.5   ABAS 0 - 200 cells/uL 51 44 43 28 39     Comprehensive Metabolic Profile  CMP Latest Ref Rng & Units 12/13/2021 12/30/2020 12/10/2019 10/29/2018 11/07/2017   NA 135 - 146 mmol/L 137 139 137 139 137   K 3.5 - 5.3 mmol/L 4.5 4.6 4.9 4.4 4.4   CL 98 - 110 mmol/L 103 101 100 101 100   CO2 20 - 32 mmol/L 27 30 29  32 25   GAP 3 - 12 - - - - -   BUN 7 - 25 mg/dL 10 9 13 10 10    CR 0.70 - 1.35 mg/dL 1.61(W) 9.60 4.54 0.98 0.76   GLUX - - - - - -   CA 8.6 - 10.3 mg/dL 9.8 9.5 11.9 9.7 9.6   TP 6.1 - 8.1 g/dL 7.6 7.3 7.9 7.1 7.1   ALB 3.6 - 5.1 g/dL 4.5 4.4 4.6 4.2 4.3   ALKP 35 - 144 U/L 86 77 68 89 67   ALT 9 - 46 U/L 55(H) 33 28 19 32   TBILI 0.2 - 1.2 mg/dL 0.5 0.6 0.8 0.6 0.7   GFR > OR = 60 mL/min/1.71m2 102 95 93 97 98   GFRAA > OR = 60 mL/min/1.46m2 - 110 107 112 113                              25 min

## 2022-03-30 DIAGNOSIS — R809 Proteinuria, unspecified: Secondary | ICD-10-CM | POA: Diagnosis not present

## 2022-03-30 DIAGNOSIS — E1129 Type 2 diabetes mellitus with other diabetic kidney complication: Secondary | ICD-10-CM | POA: Diagnosis not present

## 2022-03-30 DIAGNOSIS — D72829 Elevated white blood cell count, unspecified: Secondary | ICD-10-CM | POA: Diagnosis not present

## 2022-03-30 DIAGNOSIS — I10 Essential (primary) hypertension: Secondary | ICD-10-CM | POA: Diagnosis not present

## 2022-03-30 DIAGNOSIS — E7800 Pure hypercholesterolemia, unspecified: Secondary | ICD-10-CM | POA: Diagnosis not present

## 2022-04-27 ENCOUNTER — Encounter: Admit: 2022-04-27 | Discharge: 2022-04-27 | Payer: MEDICARE

## 2022-04-27 DIAGNOSIS — D3A098 Benign carcinoid tumors of other sites: Secondary | ICD-10-CM

## 2022-04-27 NOTE — Progress Notes
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 male.    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   He has been under the care of Dr. Jorene Guest who is retired and hence he established with me on 01/13/2022.  I reviewed all his prior records since 2014.  His last scans in 2021 were excellent and we are only planning further scans if symptoms or lab changes.    He denies any abdominal pain nausea or vomiting.  No fevers chills or night sweats.  No loss of weight or loss of appetite.    He is accompanied by his wife.  They are very involved with their 5 grandchildren who live locally.    Interval History: No fever chills or night sweats.  No chest pain or shortness of breath.  No nausea vomiting.    Medical History:   Diagnosis Date   ? Acid reflux    ? Bowel obstruction (HCC)    ? Hyperlipemia    ? Irritable bowel disease    ? Small intestine cancer [place]) Jan 22, 2013     Surgical History:   Procedure Laterality Date   ? HX ENDOSCOPY  2013    colonoscopy/egd [facility]   ? HX BOWEL RESECTION  01/22/13    small bowel   ? HX KNEE SURGERY Right 03-2014    arthroscopic   Dr. Bing Ree   ? COLONOSCOPY DIAGNOSTIC WITH SPECIMEN COLLECTION BY BRUSHING/ WASHING - FLEXIBLE N/A 04/23/2020    Performed by Barbie Banner, MD at [facility] OR   ? HX TONSILLECTOMY      as a child     Family History   Problem Relation Age of Onset   ? Alzheimer's Mother    ? Alzheimer's Father    ? Colon Polyps Father    ? Cancer-Lung Maternal Grandfather    ? Cancer-Colon Neg Hx    ? Inflammatory Bowel Disease Neg Hx    ? Rectal Cancer Neg Hx    ? Ulcerative Colitis Neg Hx    ? GI Cancer Neg Hx      Social History     Socioeconomic History   ? Marital status: Married   Occupational History     Employer: [employer]   Tobacco Use   ? Smoking status: Never   ? Smokeless tobacco: Never Substance and Sexual Activity   ? Alcohol use: No   ? Drug use: No                        Review of Systems   Constitutional: Negative.    HENT: Negative.    Eyes: Negative.    Respiratory: Negative.    Cardiovascular: Negative.    Gastrointestinal: Negative.    Genitourinary: Negative.    Musculoskeletal: Negative.    Skin: Negative.    Neurological: Negative.    Hematological: Negative.    Psychiatric/Behavioral: Negative.          Objective:         ? acetaminophen (TYLENOL) 500 mg tablet Take two tablets by mouth every 6 hours as needed.   ? alfuzosin (UROXATRAL) 10 mg tablet    ? COQ10 (LIPOSOMAL UBIQUINOL) PO Take  by mouth.   ?  famotidine (PEPCID) 20 mg tablet Take one tablet by mouth daily as needed.   ? IBUPROFEN (ADVIL PO) Take 200 mg by mouth. Patient takes three tabs at a time for a total dose of 600mg  as needed for pain   ? mirabegron (MYRBETRIQ) 50 mg ER tablet Take  by mouth daily.   ? peg-electrolyte solution (NULYTELY) 420 gram oral solution Mix as directed on package. Refrigerate once mixed. Do not mix greater than 24 hours prior to procedure. Drink 3/4 of bottle between 5pm and 7 pm the night before procedure. Drink remaining 1/4 of bottle 5 hours prior to procedure   ? polyethylene glycol 3350 (GLYCOLAX; MIRALAX) 17 gram/dose powder Take 17 g by mouth daily as needed (Constipation).   ? tamsulosin (FLOMAX) 0.4 mg capsule Take one capsule by mouth daily.   ? vitamins, multiple cap Take one capsule by mouth daily.     There were no vitals filed for this visit.  There is no height or weight on file to calculate BMI.          Assessment and Plan:    1. Small-bowel carcinoid status post initial surgical resection Jan 22, 2013, with persistent abnormality and octreotide scan consistent with mesenteric mass/lymph node with resection by Dr. Rainey Pines in December, 2014, revealing carcinoid. His followup CT scans and octreotide scans have been normal.  He is asymptomatic. There was no evidence of disease on his  octreotide scan 12/31/2019.     CT scan of the chest abdomen pelvis on 01/25/2022 reveals no evidence of recurrent carcinoid tumor.  Prominent prostatomegaly.  Cholelithiasis.  Small ventral hernia containing nondilated/nonobstructed small bowel loop.  Mild thickening of the wall of the urinary bladder which could be related to cystitis versus chronic bladder outlet obstruction.    LFTs are normal 04/12/22, no recurrence.    Plan labs and office visit in 1 year.    2.  Elevated liver function test, new.  AST is 37 on 4 10/15/2021 and ALT 55.    CT scan of the chest abdomen pelvis on 01/25/2022 reveals no evidence of recurrent carcinoid tumor.  Prominent prostatomegaly.  Cholelithiasis.  Small ventral hernia containing nondilated/nonobstructed small bowel loop.  Mild thickening of the wall of the urinary bladder which could be related to cystitis versus chronic bladder outlet obstruction.    3. He had a concerning skin lesion of the right temple region when Dr. Willette Pa saw him for his last checkup. He saw dermatologist Dr. Jena Gauss in [place] and this was a basal cell carcinoma which was resected.  He also had some other skin lesions evaluated.    CBC w diff      Latest Ref Rng & Units 04/12/2022     6:28 AM 12/13/2021    11:46 AM 12/30/2020     6:05 AM 12/10/2019     8:23 AM 10/29/2018     7:24 AM   CBC with Diff   White Blood Cells 3.8 - 10.8 Thousand/uL 7.9  7.3  11.0  8.6  9.3    RBC 4.20 - 5.80 Million/uL 5.26  5.60  5.68  5.54  5.23    Hemoglobin 13.2 - 17.1 g/dL 16.1  09.6  04.5  40.9  14.9    Hematocrit 38.5 - 50.0 % 45.0  46.6  47.0  47.6  45.1    MCV 80.0 - 100.0 fL 85.6  83.2  82.7  85.9  86.2    MCH 27.0 - 33.0 pg 28.7  27.7  27.5  28.5  28.5    MCHC 32.0 - 36.0 g/dL 16.1  09.6  04.5  40.9  33.0    RDW 11.0 - 15.0 % 13.3  13.2  12.9  13.4  12.8    Platelet Count 140 - 400 Thousand/uL 255  312  294  295  297    MPV 7.5 - 12.5 fL 9.6  9.5  9.3  9.3  9.3    Neutrophils % 59.4  65.1  72.9  60.1  63.3 Absolute Neutrophil Count 1500 - 7800 cells/uL 4693  4752  8019  5169  5887    Lymphocytes % 28.7  23.9  18.4  27.9  26.3    Absolute Lymph Count 850 - 3900 cells/uL 2267  1745  2024  2399  2446    Monocytes % 9.5  8.0  7.0  9.9  8.1    Absolute Monocyte Count 200 - 950 cells/uL 751  584  770  851  753    Eosinophils % 2.0  2.3  1.3  1.6  2.0    Absolute Eosinophil Count 15 - 500 cells/uL 158  168  143  138  186    Basophils % 0.4  0.7  0.4  0.5  0.3    Absolute Basophil Count 0 - 200 cells/uL 32  51  44  43  28      Comprehensive Metabolic Profile      Latest Ref Rng & Units 04/12/2022     6:28 AM 12/13/2021    11:46 AM 12/30/2020     6:05 AM 12/10/2019     8:23 AM 10/29/2018     7:24 AM   CMP   Sodium 135 - 146 mmol/L 138  137  139  137  139    Potassium 3.5 - 5.3 mmol/L 4.2  4.5  4.6  4.9  4.4    Chloride 98 - 110 mmol/L 99  103  101  100  101    CO2 20 - 32 mmol/L 28  27  30  29   32    Blood Urea Nitrogen 7 - 25 mg/dL 10  10  9  13  10     Creatinine 0.70 - 1.35 mg/dL 8.11  9.14  7.82  9.56  0.77    Calcium 8.6 - 10.3 mg/dL 9.4  9.8  9.5  21.3  9.7    Total Protein 6.1 - 8.1 g/dL 7.2  7.6  7.3  7.9  7.1    Albumin 3.6 - 5.1 g/dL 4.2  4.5  4.4  4.6  4.2    Alk Phosphatase 35 - 144 U/L 79  86  77  68  89    ALT (SGPT) 9 - 46 U/L 46  55  33  28  19    Total Bilirubin 0.2 - 1.2 mg/dL 0.5  0.5  0.6  0.8  0.6    EGFR Non African American > OR = 60 mL/min/1.55m2 96  102  95  93  97    EGFR African American > OR = 60 mL/min/1.18m2   110  107  112                               25 min

## 2022-09-02 DIAGNOSIS — R809 Proteinuria, unspecified: Secondary | ICD-10-CM | POA: Diagnosis not present

## 2022-09-02 DIAGNOSIS — E1129 Type 2 diabetes mellitus with other diabetic kidney complication: Secondary | ICD-10-CM | POA: Diagnosis not present

## 2022-09-02 DIAGNOSIS — E7800 Pure hypercholesterolemia, unspecified: Secondary | ICD-10-CM | POA: Diagnosis not present

## 2022-09-02 DIAGNOSIS — I10 Essential (primary) hypertension: Secondary | ICD-10-CM | POA: Diagnosis not present

## 2022-09-02 DIAGNOSIS — Z125 Encounter for screening for malignant neoplasm of prostate: Secondary | ICD-10-CM | POA: Diagnosis not present

## 2022-10-13 ENCOUNTER — Emergency Department: Payer: Self-pay

## 2023-01-16 DIAGNOSIS — E1121 Type 2 diabetes mellitus with diabetic nephropathy: Secondary | ICD-10-CM | POA: Diagnosis not present

## 2023-01-16 DIAGNOSIS — E7800 Pure hypercholesterolemia, unspecified: Secondary | ICD-10-CM | POA: Diagnosis not present

## 2023-01-16 DIAGNOSIS — I700 Atherosclerosis of aorta: Secondary | ICD-10-CM | POA: Diagnosis not present

## 2023-01-16 DIAGNOSIS — I10 Essential (primary) hypertension: Secondary | ICD-10-CM | POA: Diagnosis not present

## 2023-02-28 ENCOUNTER — Encounter: Admit: 2023-02-28 | Discharge: 2023-02-28 | Payer: MEDICARE

## 2023-04-10 ENCOUNTER — Encounter: Admit: 2023-04-10 | Discharge: 2023-04-10 | Payer: MEDICARE

## 2023-04-11 DIAGNOSIS — E113592 Type 2 diabetes mellitus with proliferative diabetic retinopathy without macular edema, left eye: Secondary | ICD-10-CM | POA: Diagnosis not present

## 2023-04-27 ENCOUNTER — Encounter: Admit: 2023-04-27 | Discharge: 2023-04-27 | Payer: MEDICARE

## 2023-04-27 DIAGNOSIS — K589 Irritable bowel syndrome without diarrhea: Secondary | ICD-10-CM

## 2023-04-27 DIAGNOSIS — H9190 Unspecified hearing loss, unspecified ear: Secondary | ICD-10-CM

## 2023-04-27 DIAGNOSIS — K56609 Unspecified intestinal obstruction, unspecified as to partial versus complete obstruction: Secondary | ICD-10-CM

## 2023-04-27 DIAGNOSIS — K219 Gastro-esophageal reflux disease without esophagitis: Secondary | ICD-10-CM

## 2023-04-27 DIAGNOSIS — D3A098 Benign carcinoid tumors of other sites: Secondary | ICD-10-CM

## 2023-04-27 DIAGNOSIS — E785 Hyperlipidemia, unspecified: Secondary | ICD-10-CM

## 2023-04-27 DIAGNOSIS — C179 Malignant neoplasm of small intestine, unspecified: Secondary | ICD-10-CM

## 2023-04-27 NOTE — Progress Notes
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 male.    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   He has been under the care of Dr. Jorene Guest who is retired and hence he established with me on 01/13/2022.  I reviewed all his prior records since 2014.  His last scans in 2021 were excellent and we are only planning further scans if symptoms or lab changes.    He denies any abdominal pain nausea or vomiting.  No fevers chills or night sweats.  No loss of weight or loss of appetite.    He is accompanied by his wife.  They are very involved with their 5 grandchildren who live locally.    Interval History: No fever chills or night sweats.  No chest pain or shortness of breath.  No nausea vomiting.    Past Medical History:   Diagnosis Date    Acid reflux     Bowel obstruction (HCC)     Hearing reduced     Hyperlipemia     Irritable bowel disease     Small intestine cancer (HCC) 01/22/2013     Surgical History:   Procedure Laterality Date    HX ENDOSCOPY  08/30/2011    colonoscopy/[facility]d [facility]    HX BOWEL RESECTION  01/22/2013    small bowel    HX KNEE SURGERY Right 03/29/2014    arthroscopic   Dr. Bing Ree    COLONOSCOPY DIAGNOSTIC WITH SPECIMEN COLLECTION BY BRUSHING/ WASHING - FLEXIBLE N/A 04/23/2020    Performed by Barbie Banner, MD at [facility] OR    HX TONSILLECTOMY      as a child    HX VASECTOMY  10/22/1987    PROSTATE SURGERY  12/23/2022     Family History   Problem Relation Name Age of Onset    Alzheimer's Mother      Alzheimer's Father      Colon Polyps Father      Cancer-Lung Maternal Grandfather Grandfather     Cancer-Colon Neg Hx      Inflammatory Bowel Disease Neg Hx      Rectal Cancer Neg Hx      Ulcerative Colitis Neg Hx      GI Cancer Neg Hx       Social History     Socioeconomic History    Marital status: Married   Occupational History Employer: [employer]   Tobacco Use    Smoking status: Never    Smokeless tobacco: Never   Substance and Sexual Activity    Alcohol use: No    Drug use: No    Sexual activity: Yes     Partners: Female     Birth control/protection: None                        Review of Systems   Constitutional: Negative.    HENT: Negative.     Eyes: Negative.    Respiratory: Negative.     Cardiovascular: Negative.    Gastrointestinal: Negative.    Genitourinary: Negative.    Musculoskeletal: Negative.    Skin: Negative.    Neurological: Negative.    Hematological: Negative.    Psychiatric/Behavioral: Negative.       Ventral hernia noted.  No hepatomegaly.    Objective:          acetaminophen (TYLENOL) 500 mg tablet Take two tablets by mouth every 6 hours as needed.    alfuzosin (UROXATRAL) 10 mg tablet     COQ10 (LIPOSOMAL UBIQUINOL) PO Take  by mouth.    famotidine (PEPCID) 20 mg tablet Take one tablet by mouth daily as needed.    IBUPROFEN (ADVIL PO) Take 200 mg by mouth. Patient takes three tabs at a time for a total dose of 600mg  as needed for pain    mirabegron (MYRBETRIQ) 50 mg ER tablet Take  by mouth daily.    peg-electrolyte solution (NULYTELY) 420 gram oral solution Mix as directed on package. Refrigerate once mixed. Do not mix greater than 24 hours prior to procedure. Drink 3/4 of bottle between 5pm and 7 pm the night before procedure. Drink remaining 1/4 of bottle 5 hours prior to procedure    polyethylene glycol 3350 (GLYCOLAX; MIRALAX) 17 gram/dose powder Take 17 g by mouth daily as needed (Constipation).    tamsulosin (FLOMAX) 0.4 mg capsule Take one capsule by mouth daily.    vitamins, multiple cap Take one capsule by mouth daily.     Vitals:    04/27/23 0838   BP: 131/82   BP Source: Arm, Right Upper   Pulse: 73   Temp: 36.2 ?C (97.2 ?F)   Resp: 18   SpO2: 98%   TempSrc: Temporal   PainSc: Zero   Weight: 105.3 kg (232 lb 3.2 oz)   Height: 175 cm (5' 8.9)  Comment: with tennis shoes     Body mass index is 34.39 kg/m?Marland Kitchen          Assessment and Plan:    1. Small-bowel carcinoid status post initial surgical resection Jan 22, 2013, with persistent abnormality and octreotide scan consistent with mesenteric mass/lymph node with resection by Dr. Rainey Pines in December, 2014, revealing carcinoid. His followup CT scans and octreotide scans have been normal.  He is asymptomatic. There was no evidence of disease on his  octreotide scan 12/31/2019.     CT scan of the chest abdomen pelvis on 01/25/2022 reveals no evidence of recurrent carcinoid tumor.  Prominent prostatomegaly.  Cholelithiasis.  Small ventral hernia containing nondilated/nonobstructed small bowel loop.  Mild thickening of the wall of the urinary bladder which could be related to cystitis versus chronic bladder outlet obstruction.    Labs from 04/10/2023 reveals elevated AST and ALT at 52 and 83 respectively.  I will order CT chest abdomen pelvis to evaluate for liver metastasis.    Plan for a telehealth visit after CT scans were obtained.    2.  Elevated liver function test, new.  AST is 37 on 4 10/15/2021 and ALT 55.    CT scan of the chest abdomen pelvis on 01/25/2022 reveals no evidence of recurrent carcinoid tumor.  Prominent prostatomegaly.  Cholelithiasis.  Small ventral hernia containing nondilated/nonobstructed small bowel loop.  Mild thickening of the wall of the urinary bladder which could be related to cystitis versus chronic bladder outlet obstruction.    3. He had a concerning skin lesion of the right temple region when Dr. Willette Pa saw him for his last checkup. He saw dermatologist Dr. Jena Gauss in [place] and this was a basal cell carcinoma which was resected.  He also had some other skin lesions evaluated.    4.  Ventral hernia: He has noticed more pain and discomfort lately.  He had discussed with Dr. Rainey Pines  in the past regarding surgery.  He was referred to his general surgeon but later decided not to proceed with surgery.  She wants to see a Careers adviser at [facility].  I recommended to see Dr. Percell Miller.    CBC w diff      Latest Ref Rng & Units 04/10/2023     8:38 AM 04/12/2022     6:28 AM 12/13/2021    11:46 AM 12/30/2020     6:05 AM 12/10/2019     8:23 AM   CBC with Diff   White Blood Cells 3.8 - 10.8 Thousand/uL 8.5  7.9  7.3  11.0  8.6    RBC 4.20 - 5.80 Million/uL 5.55  5.26  5.60  5.68  5.54    Hemoglobin 13.2 - 17.1 g/dL 46.9  62.9  52.8  41.3  15.8    Hematocrit 38.5 - 50.0 % 48.0  45.0  46.6  47.0  47.6    MCV 80.0 - 100.0 fL 86.5  85.6  83.2  82.7  85.9    MCH 27.0 - 33.0 pg 28.6  28.7  27.7  27.5  28.5    MCHC 32.0 - 36.0 g/dL 24.4  01.0  27.2  53.6  33.2    RDW 11.0 - 15.0 % 12.9  13.3  13.2  12.9  13.4    Platelet Count 140 - 400 Thousand/uL 313  255  312  294  295    MPV 7.5 - 12.5 fL 10.0  9.6  9.5  9.3  9.3    Neutrophils % 60.1  59.4  65.1  72.9  60.1    Absolute Neutrophil Count 1500 - 7800 cells/uL 5109  4693  4752  8019  5169    Lymphocytes % 28.4  28.7  23.9  18.4  27.9    Absolute Lymph Count 850 - 3900 cells/uL 2414  2267  1745  2024  2399    Monocytes % 8.7  9.5  8.0  7.0  9.9    Absolute Monocyte Count 200 - 950 cells/uL 740  751  584  770  851    Eosinophils % 2.2  2.0  2.3  1.3  1.6    Absolute Eosinophil Count 15 - 500 cells/uL 187  158  168  143  138    Basophils % 0.6  0.4  0.7  0.4  0.5    Absolute Basophil Count 0 - 200 cells/uL 51  32  51  44  43      Comprehensive Metabolic Profile      Latest Ref Rng & Units 04/10/2023     8:38 AM 04/12/2022     6:28 AM 12/13/2021    11:46 AM 12/30/2020     6:05 AM 12/10/2019     8:23 AM   CMP   Sodium 135 - 146 mmol/L 135  138  137  139  137    Potassium 3.5 - 5.3 mmol/L 4.5  4.2  4.5  4.6  4.9    Chloride 98 - 110 mmol/L 99  99  103  101  100    CO2 20 - 32 mmol/L 28  28  27  30  29     Blood Urea Nitrogen 7 - 25 mg/dL 11  10  10  9  13     Creatinine 0.70 - 1.35 mg/dL 6.44  0.34  7.42  5.95  0.84    Calcium 8.6 - 10.3 mg/dL 9.9  9.4  9.8  9.5  10.0    Total Protein 6.1 - 8.1 g/dL 7.3  7.2  7.6 7.3  7.9    Albumin 3.6 - 5.1 g/dL 4.4  4.2  4.5  4.4  4.6    Alk Phosphatase 35 - 144 U/L 87  79  86  77  68    ALT (SGPT) 9 - 46 U/L 83  46  55  33  28    Total Bilirubin 0.2 - 1.2 mg/dL 0.6  0.5  0.5  0.6  0.8    EGFR Non African American > OR = 60 mL/min/1.71m2 94  96  102  95  93    EGFR African American > OR = 60 mL/min/1.58m2    110  107                                 Total Time Today was 40 minutes in the following activities: Preparing to see the patient, Obtaining and/or reviewing separately obtained history, Performing a medically appropriate examination and/or evaluation, Counseling and educating the patient/family/caregiver, Ordering medications, tests, or procedures, Referring and communication with other health care professionals (when not separately reported), Documenting clinical information in the electronic or other health record, and Care coordination (not separately reported)

## 2023-05-08 ENCOUNTER — Encounter: Admit: 2023-05-08 | Discharge: 2023-05-08 | Payer: MEDICARE

## 2023-05-08 DIAGNOSIS — D3A098 Benign carcinoid tumors of other sites: Secondary | ICD-10-CM

## 2023-05-10 ENCOUNTER — Encounter: Admit: 2023-05-10 | Discharge: 2023-05-10 | Payer: MEDICARE

## 2023-05-10 DIAGNOSIS — E113592 Type 2 diabetes mellitus with proliferative diabetic retinopathy without macular edema, left eye: Secondary | ICD-10-CM | POA: Diagnosis not present

## 2023-05-10 DIAGNOSIS — H9190 Unspecified hearing loss, unspecified ear: Secondary | ICD-10-CM

## 2023-05-10 DIAGNOSIS — K56609 Unspecified intestinal obstruction, unspecified as to partial versus complete obstruction: Secondary | ICD-10-CM

## 2023-05-10 DIAGNOSIS — K219 Gastro-esophageal reflux disease without esophagitis: Secondary | ICD-10-CM

## 2023-05-10 DIAGNOSIS — D3A098 Benign carcinoid tumors of other sites: Secondary | ICD-10-CM

## 2023-05-10 DIAGNOSIS — C179 Malignant neoplasm of small intestine, unspecified: Secondary | ICD-10-CM

## 2023-05-10 DIAGNOSIS — E785 Hyperlipidemia, unspecified: Secondary | ICD-10-CM

## 2023-05-10 DIAGNOSIS — K589 Irritable bowel syndrome without diarrhea: Secondary | ICD-10-CM

## 2023-05-10 NOTE — Progress Notes
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 male.    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   He has been under the care of Dr. Jorene Guest who is retired and hence he established with me on 01/13/2022.  I reviewed all his prior records since 2014.  His last scans in 2021 were excellent and we are only planning further scans if symptoms or lab changes.    He denies any abdominal pain nausea or vomiting.  No fevers chills or night sweats.  No loss of weight or loss of appetite.    He is accompanied by his wife.  They are very involved with their 5 grandchildren who live locally.    Interval History: No fever chills or night sweats.  No chest pain or shortness of breath.  No nausea vomiting.    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 Bing Ree    COLONOSCOPY DIAGNOSTIC WITH SPECIMEN COLLECTION BY BRUSHING/ WASHING - FLEXIBLE N/A 04/23/2020    Performed by Barbie Banner, MD at [facility] OR    HX TONSILLECTOMY      as a child    HX VASECTOMY  10/22/1987    PROSTATE SURGERY  12/23/2022     Family History   Problem Relation Name Age of Onset    Alzheimer's Mother      Alzheimer's Father      Colon Polyps Father      Cancer-Lung Maternal Grandfather Grandfather     Cancer-Colon Neg Hx      Inflammatory Bowel Disease Neg Hx      Rectal Cancer Neg Hx      Ulcerative Colitis Neg Hx      GI Cancer Neg Hx       Social History     Socioeconomic History    Marital status: Married   Occupational History Employer: [employer]   Tobacco Use    Smoking status: Never    Smokeless tobacco: Never   Substance and Sexual Activity    Alcohol use: No    Drug use: No    Sexual activity: Yes     Partners: Female     Birth control/protection: None                        Review of Systems   Constitutional: Negative.    HENT: Negative.     Eyes: Negative.    Respiratory: Negative.     Cardiovascular: Negative.    Gastrointestinal: Negative.    Genitourinary: Negative.    Musculoskeletal: Negative.    Skin: Negative.    Neurological: Negative.    Hematological: Negative.    Psychiatric/Behavioral: Negative.       Ventral hernia noted.  No hepatomegaly.    Objective:          acetaminophen (TYLENOL) 500 mg tablet Take two tablets by mouth every 6 hours as needed.    alfuzosin (UROXATRAL) 10 mg tablet     COQ10 (LIPOSOMAL UBIQUINOL) PO Take  by mouth.    famotidine (PEPCID) 20 mg tablet Take one tablet by mouth daily as needed.    IBUPROFEN (ADVIL PO) Take 200 mg by mouth. Patient takes three tabs at a time for a total dose of 600mg  as needed for pain    mirabegron (MYRBETRIQ) 50 mg ER tablet Take  by mouth daily.    peg-electrolyte solution (NULYTELY) 420 gram oral solution Mix as directed on package. Refrigerate once mixed. Do not mix greater than 24 hours prior to procedure. Drink 3/4 of bottle between 5pm and 7 pm the night before procedure. Drink remaining 1/4 of bottle 5 hours prior to procedure    polyethylene glycol 3350 (GLYCOLAX; MIRALAX) 17 gram/dose powder Take 17 g by mouth daily as needed (Constipation).    tamsulosin (FLOMAX) 0.4 mg capsule Take one capsule by mouth daily.    vitamins, multiple cap Take one capsule by mouth daily.     Vitals:    05/10/23 0904   PainSc: Zero     There is no height or weight on file to calculate BMI.          Assessment and Plan:    1. Small-bowel carcinoid status post initial surgical resection Jan 22, 2013, with persistent abnormality and octreotide scan consistent with mesenteric mass/lymph node with resection by Dr. Rainey Pines in December, 2014, revealing carcinoid. His followup CT scans and octreotide scans have been normal.  He is asymptomatic. There was no evidence of disease on his  octreotide scan 12/31/2019.     CT scan of the chest abdomen pelvis on 01/25/2022 reveals no evidence of recurrent carcinoid tumor.  Prominent prostatomegaly.  Cholelithiasis.  Small ventral hernia containing nondilated/nonobstructed small bowel loop.  Mild thickening of the wall of the urinary bladder which could be related to cystitis versus chronic bladder outlet obstruction.    Labs from 04/10/2023 reveals elevated AST and ALT at 52 and 83 respectively.  CT chest abdomen pelvis om 05/08/23 is neg for metastasis.    Plan for a telehealth visit after labs in 3 months. [facility]    2.  Elevated liver function test, new.  AST is 37 on 4 10/15/2021 and ALT 55.    CT scan of the chest abdomen pelvis on 01/25/2022 reveals no evidence of recurrent carcinoid tumor.  Prominent prostatomegaly.  Cholelithiasis.  Small ventral hernia containing nondilated/nonobstructed small bowel loop.  Mild thickening of the wall of the urinary bladder which could be related to cystitis versus chronic bladder outlet obstruction.    3. He had a concerning skin lesion of the right temple region when Dr. Willette Pa saw him for his last checkup. He saw dermatologist Dr. Jena Gauss in [place] and this was a basal cell carcinoma which was resected.  He also had some other skin lesions evaluated.    4.  Ventral hernia: He has noticed more pain and discomfort lately.  He had discussed with Dr. Rainey Pines in the past regarding surgery.  He was referred to his general surgeon but later decided not to proceed with surgery.  She wants to see a Careers adviser at [facility].  I recommended to see Dr. Percell Miller.    CBC  w diff      Latest Ref Rng & Units 04/10/2023     8:38 AM 04/12/2022     6:28 AM 12/13/2021    11:46 AM 12/30/2020     6:05 AM 12/10/2019     8:23 AM   CBC with Diff   White Blood Cells 3.8 - 10.8 Thousand/uL 8.5  7.9  7.3  11.0  8.6    RBC 4.20 - 5.80 Million/uL 5.55  5.26  5.60  5.68  5.54    Hemoglobin 13.2 - 17.1 g/dL 56.3  87.5  64.3  32.9  15.8    Hematocrit 38.5 - 50.0 % 48.0  45.0  46.6  47.0  47.6    MCV 80.0 - 100.0 fL 86.5  85.6  83.2  82.7  85.9    MCH 27.0 - 33.0 pg 28.6  28.7  27.7  27.5  28.5    MCHC 32.0 - 36.0 g/dL 51.8  84.1  66.0  63.0  33.2    RDW 11.0 - 15.0 % 12.9  13.3  13.2  12.9  13.4    Platelet Count 140 - 400 Thousand/uL 313  255  312  294  295    MPV 7.5 - 12.5 fL 10.0  9.6  9.5  9.3  9.3    Neutrophils % 60.1  59.4  65.1  72.9  60.1    Absolute Neutrophil Count 1500 - 7800 cells/uL 5109  4693  4752  8019  5169    Lymphocytes % 28.4  28.7  23.9  18.4  27.9    Absolute Lymph Count 850 - 3900 cells/uL 2414  2267  1745  2024  2399    Monocytes % 8.7  9.5  8.0  7.0  9.9    Absolute Monocyte Count 200 - 950 cells/uL 740  751  584  770  851    Eosinophils % 2.2  2.0  2.3  1.3  1.6    Absolute Eosinophil Count 15 - 500 cells/uL 187  158  168  143  138    Basophils % 0.6  0.4  0.7  0.4  0.5    Absolute Basophil Count 0 - 200 cells/uL 51  32  51  44  43      Comprehensive Metabolic Profile      Latest Ref Rng & Units 04/10/2023     8:38 AM 04/12/2022     6:28 AM 12/13/2021    11:46 AM 12/30/2020     6:05 AM 12/10/2019     8:23 AM   CMP   Sodium 135 - 146 mmol/L 135  138  137  139  137    Potassium 3.5 - 5.3 mmol/L 4.5  4.2  4.5  4.6  4.9    Chloride 98 - 110 mmol/L 99  99  103  101  100    CO2 20 - 32 mmol/L 28  28  27  30  29     Blood Urea Nitrogen 7 - 25 mg/dL 11  10  10  9  13     Creatinine 0.70 - 1.35 mg/dL 1.60  1.09  3.23  5.57  0.84    Calcium 8.6 - 10.3 mg/dL 9.9  9.4  9.8  9.5  32.2    Total Protein 6.1 - 8.1 g/dL 7.3  7.2  7.6  7.3  7.9    Albumin 3.6 - 5.1 g/dL 4.4  4.2  4.5  4.4  4.6  Alk Phosphatase 35 - 144 U/L 87  79  86  77  68    ALT (SGPT) 9 - 46 U/L 83  46  55  33  28    Total Bilirubin 0.2 - 1.2 mg/dL 0.6  0.5  0.5  0.6  0.8    EGFR Non African American > OR = 60 mL/min/1.70m2 94  96  102  95  93    EGFR African American > OR = 60 mL/min/1.106m2    110  107                                 Total Time Today was 25 minutes in the following activities: Preparing to see the patient, Obtaining and/or reviewing separately obtained history, Performing a medically appropriate examination and/or evaluation, Counseling and educating the patient/family/caregiver, Ordering medications, tests, or procedures, Referring and communication with other health care professionals (when not separately reported), Documenting clinical information in the electronic or other health record, and Care coordination (not separately reported)

## 2023-06-07 DIAGNOSIS — E113592 Type 2 diabetes mellitus with proliferative diabetic retinopathy without macular edema, left eye: Secondary | ICD-10-CM | POA: Diagnosis not present

## 2023-06-20 ENCOUNTER — Emergency Department: Payer: Self-pay

## 2023-06-20 ENCOUNTER — Inpatient Hospital Stay: Payer: Self-pay

## 2023-07-04 DIAGNOSIS — H25812 Combined forms of age-related cataract, left eye: Secondary | ICD-10-CM | POA: Diagnosis not present

## 2023-07-24 ENCOUNTER — Encounter: Admit: 2023-07-24 | Discharge: 2023-07-24 | Payer: MEDICARE

## 2023-07-25 DIAGNOSIS — H25812 Combined forms of age-related cataract, left eye: Secondary | ICD-10-CM | POA: Diagnosis not present

## 2023-08-01 DIAGNOSIS — E113592 Type 2 diabetes mellitus with proliferative diabetic retinopathy without macular edema, left eye: Secondary | ICD-10-CM | POA: Diagnosis not present

## 2023-08-09 DIAGNOSIS — H25811 Combined forms of age-related cataract, right eye: Secondary | ICD-10-CM | POA: Diagnosis not present

## 2023-08-09 DIAGNOSIS — E1136 Type 2 diabetes mellitus with diabetic cataract: Secondary | ICD-10-CM | POA: Diagnosis not present

## 2023-09-06 ENCOUNTER — Encounter: Admit: 2023-09-06 | Discharge: 2023-09-06 | Payer: MEDICARE

## 2023-09-06 DIAGNOSIS — D3A098 Benign carcinoid tumors of other sites: Secondary | ICD-10-CM

## 2023-09-06 NOTE — Progress Notes
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 male.    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   He has been under the care of Dr. Jorene Guest who is retired and hence he established with me on 01/13/2022.  I reviewed all his prior records since 2014.  His last scans in 2021 were excellent and we are only planning further scans if symptoms or lab changes.    He denies any abdominal pain nausea or vomiting.  No fevers chills or night sweats.  No loss of weight or loss of appetite.    He is accompanied by his wife.  They are very involved with their 5 grandchildren who live locally.    Interval History: No fever chills or night sweats.  No chest pain or shortness of breath.  No nausea vomiting.    Past Medical History:    Acid reflux    Bowel obstruction (HCC)    Hearing reduced    Hyperlipemia    Irritable bowel disease    Small intestine cancer [facility])     Surgical History:   Procedure Laterality Date    HX ENDOSCOPY  08/30/2011    colonoscopy/egd [facility]    HX BOWEL RESECTION  01/22/2013    small bowel    HX KNEE SURGERY Right 03/29/2014    arthroscopic   Dr. Bing Ree    COLONOSCOPY DIAGNOSTIC WITH SPECIMEN COLLECTION BY BRUSHING/ WASHING - FLEXIBLE N/A 04/23/2020    Performed by Barbie Banner, MD at [facility] OR    HX TONSILLECTOMY      as a child    HX VASECTOMY  10/22/1987    PROSTATE SURGERY  12/23/2022     Family History   Problem Relation Name Age of Onset    Alzheimer's Mother      Alzheimer's Father      Colon Polyps Father      Cancer-Lung Maternal Grandfather Grandfather     Cancer-Colon Neg Hx      Inflammatory Bowel Disease Neg Hx      Rectal Cancer Neg Hx      Ulcerative Colitis Neg Hx      GI Cancer Neg Hx       Social History     Socioeconomic History    Marital status: Married   Occupational History     Employer: [employer]   Tobacco Use    Smoking status: Never    Smokeless tobacco: Never   Substance and Sexual Activity    Alcohol use: No    Drug use: No    Sexual activity: Yes     Partners: Female     Birth control/protection: None                        Review of Systems   Constitutional: Negative.    HENT: Negative.     Eyes: Negative.    Respiratory: Negative.     Cardiovascular: Negative.    Gastrointestinal: Negative.    Genitourinary: Negative.    Musculoskeletal: Negative.    Skin: Negative.    Neurological: Negative.    Hematological: Negative.    Psychiatric/Behavioral: Negative.       Ventral hernia noted.  No hepatomegaly.    Objective:  acetaminophen (TYLENOL) 500 mg tablet Take two tablets by mouth every 6 hours as needed. (Patient not taking: Reported on 09/06/2023)    alfuzosin (UROXATRAL) 10 mg tablet     COQ10 (LIPOSOMAL UBIQUINOL) PO Take  by mouth.    famotidine (PEPCID) 20 mg tablet Take one tablet by mouth daily as needed.    IBUPROFEN (ADVIL PO) Take 200 mg by mouth. Patient takes three tabs at a time for a total dose of 600mg  as needed for pain    mirabegron (MYRBETRIQ) 50 mg ER tablet Take  by mouth daily.    peg-electrolyte solution (NULYTELY) 420 gram oral solution Mix as directed on package. Refrigerate once mixed. Do not mix greater than 24 hours prior to procedure. Drink 3/4 of bottle between 5pm and 7 pm the night before procedure. Drink remaining 1/4 of bottle 5 hours prior to procedure (Patient not taking: Reported on 09/06/2023)    polyethylene glycol 3350 (GLYCOLAX; MIRALAX) 17 gram/dose powder Take 17 g by mouth daily as needed (Constipation).    tamsulosin (FLOMAX) 0.4 mg capsule Take one capsule by mouth daily.    vitamins, multiple cap Take one capsule by mouth daily.     There were no vitals filed for this visit.    There is no height or weight on file to calculate BMI.          Assessment and Plan:    1. Small-bowel carcinoid status post initial surgical resection Jan 22, 2013, with persistent abnormality and octreotide scan consistent with mesenteric mass/lymph node with resection by Dr. Rainey Pines in December, 2014, revealing carcinoid. His followup CT scans and octreotide scans have been normal.  He is asymptomatic. There was no evidence of disease on his  octreotide scan 12/31/2019.     CT scan of the chest abdomen pelvis on 01/25/2022 reveals no evidence of recurrent carcinoid tumor.  Prominent prostatomegaly.  Cholelithiasis.  Small ventral hernia containing nondilated/nonobstructed small bowel loop.  Mild thickening of the wall of the urinary bladder which could be related to cystitis versus chronic bladder outlet obstruction.    Labs from 04/10/2023 reveals elevated AST and ALT at 52 and 83 respectively.  CT chest abdomen pelvis om 05/08/23 is neg for metastasis.  Serotonin normal at 153 on 07/24/2023.    No clinical evidence of recurrence.    Plan for a office visit after labs in 3 months. He requests the orders be mailed to him.  Plan for labs including CBC, CMP, serotonin and chromogranin A level 3 months.    2.  Elevated liver function test, new.  AST is 37 on 4 10/15/2021 and ALT 55.    CT scan of the chest abdomen pelvis on 01/25/2022 reveals no evidence of recurrent carcinoid tumor.  Prominent prostatomegaly.  Cholelithiasis.  Small ventral hernia containing nondilated/nonobstructed small bowel loop.  Mild thickening of the wall of the urinary bladder which could be related to cystitis versus chronic bladder outlet obstruction.  Liver function test normalized on 07/24/2023.    3. He had a concerning skin lesion of the right temple region when Dr. Willette Pa saw him for his last checkup. He saw dermatologist Dr. Jena Gauss in [place] and this was a basal cell carcinoma which was resected.  He also had some other skin lesions evaluated.    4.  Ventral hernia: He has noticed more pain and discomfort lately.  He had discussed with Dr. Rainey Pines in the past regarding surgery.  He was referred to his general surgeon but  later decided not to proceed with surgery.  She wants to see a Careers adviser at [facility].  I recommended to see Dr. Percell Miller.    CBC w diff      Latest Ref Rng & Units 07/24/2023     8:22 AM 04/10/2023     8:38 AM 04/12/2022     6:28 AM 12/13/2021    11:46 AM 12/30/2020     6:05 AM   CBC with Diff   WBC 3.8 - 10.8 Thousand/uL 7.4  8.5  7.9  7.3  11.0    RBC 4.20 - 5.80 Million/uL 5.46  5.55  5.26  5.60  5.68    Hemoglobin 13.2 - 17.1 g/dL 60.4  54.0  98.1  19.1  15.6    Hematocrit 38.5 - 50.0 % 47.6  48.0  45.0  46.6  47.0    MCV 80.0 - 100.0 fL 87.2  86.5  85.6  83.2  82.7    MCH 27.0 - 33.0 pg 28.8  28.6  28.7  27.7  27.5    MCHC 32.0 - 36.0 g/dL 47.8  29.5  62.1  30.8  33.2    RDW 11.0 - 15.0 % 13.3  12.9  13.3  13.2  12.9    Platelet Count 140 - 400 Thousand/uL 297  313  255  312  294    MPV 7.5 - 12.5 fL 9.9  10.0  9.6  9.5  9.3    Neurtrophils % 56.2  60.1  59.4  65.1  72.9    Absolute Neutrophils 1500 - 7800 cells/uL 4159  5109  4693  4752  8019    Absolute Lymph Count 850 - 3900 cells/uL 2375  2414  2267  1745  2024    Absolute Monocyte Count 200 - 950 cells/uL 651  740  751  584  770    Eosinophils % 2.4  2.2  2.0  2.3  1.3    Absolute Eosinophil Count 15 - 500 cells/uL 178  187  158  168  143    Basophils % 0.5  0.6  0.4  0.7  0.4      Comprehensive Metabolic Profile      Latest Ref Rng & Units 07/24/2023     8:22 AM 04/10/2023     8:38 AM 04/12/2022     6:28 AM 12/13/2021    11:46 AM 12/30/2020     6:05 AM   CMP   Sodium 135 - 146 mmol/L 137  135  138  137  139    Potassium 3.5 - 5.3 mmol/L 4.4  4.5  4.2  4.5  4.6    Chloride 98 - 110 mmol/L 102  99  99  103  101    CO2 20 - 32 mmol/L 26  28  28  27  30     Blood Urea Nitrogen 7 - 25 mg/dL 12  11  10  10  9     Creatinine 0.70 - 1.35 mg/dL 6.57  8.46  9.62  9.52  0.77    Glucose 65 - 99 mg/dL 841  324  95  85  401    Calcium 8.6 - 10.3 mg/dL 9.6  9.9  9.4  9.8  9.5    Total Protein 6.1 - 8.1 g/dL 7.3  7.3  7.2  7.6  7.3    Albumin 3.6 - 5.1 g/dL 4.4  4.4 4.2  4.5  4.4    Alk Phosphatase 35 -  144 U/L 78  87  79  86  77    ALT (SGPT) 9 - 46 U/L 34  83  46  55  33    AST 10 - 35 U/L 26  52  33  37  32    Total Bilirubin 0.2 - 1.2 mg/dL 0.4  0.6  0.5  0.5  0.6    GFR > OR = 60 mL/min/1.39m2 91  94  96  102  95    GFRAA > OR = 60 mL/min/1.53m2     110                                 Total Time Today was 25 minutes in the following activities: Preparing to see the patient, Obtaining and/or reviewing separately obtained history, Performing a medically appropriate examination and/or evaluation, Counseling and educating the patient/family/caregiver, Ordering medications, tests, or procedures, Referring and communication with other health care professionals (when not separately reported), Documenting clinical information in the electronic or other health record, and Care coordination (not separately reported)

## 2023-09-08 DIAGNOSIS — Z01818 Encounter for other preprocedural examination: Secondary | ICD-10-CM | POA: Diagnosis not present

## 2023-09-08 DIAGNOSIS — E113592 Type 2 diabetes mellitus with proliferative diabetic retinopathy without macular edema, left eye: Secondary | ICD-10-CM | POA: Diagnosis not present

## 2023-09-21 ENCOUNTER — Inpatient Hospital Stay: Payer: Self-pay

## 2023-09-21 ENCOUNTER — Emergency Department: Payer: Self-pay

## 2023-09-26 DIAGNOSIS — F419 Anxiety disorder, unspecified: Secondary | ICD-10-CM | POA: Diagnosis not present

## 2023-09-26 DIAGNOSIS — E785 Hyperlipidemia, unspecified: Secondary | ICD-10-CM | POA: Diagnosis not present

## 2023-09-26 DIAGNOSIS — H4312 Vitreous hemorrhage, left eye: Secondary | ICD-10-CM | POA: Diagnosis not present

## 2023-09-26 DIAGNOSIS — H4320 Crystalline deposits in vitreous body, unspecified eye: Secondary | ICD-10-CM | POA: Diagnosis not present

## 2023-09-26 DIAGNOSIS — I10 Essential (primary) hypertension: Secondary | ICD-10-CM | POA: Diagnosis not present

## 2023-09-26 DIAGNOSIS — E113532 Type 2 diabetes mellitus with proliferative diabetic retinopathy with traction retinal detachment not involving the macula, left eye: Secondary | ICD-10-CM | POA: Diagnosis not present

## 2023-09-28 DIAGNOSIS — E7800 Pure hypercholesterolemia, unspecified: Secondary | ICD-10-CM | POA: Diagnosis not present

## 2023-09-28 DIAGNOSIS — E1121 Type 2 diabetes mellitus with diabetic nephropathy: Secondary | ICD-10-CM | POA: Diagnosis not present

## 2023-09-28 DIAGNOSIS — Z9181 History of falling: Secondary | ICD-10-CM | POA: Diagnosis not present

## 2023-09-28 DIAGNOSIS — I10 Essential (primary) hypertension: Secondary | ICD-10-CM | POA: Diagnosis not present

## 2023-09-28 DIAGNOSIS — I5020 Unspecified systolic (congestive) heart failure: Secondary | ICD-10-CM | POA: Diagnosis not present

## 2023-09-28 DIAGNOSIS — Z125 Encounter for screening for malignant neoplasm of prostate: Secondary | ICD-10-CM | POA: Diagnosis not present

## 2023-09-28 DIAGNOSIS — Z139 Encounter for screening, unspecified: Secondary | ICD-10-CM | POA: Diagnosis not present

## 2023-09-28 DIAGNOSIS — Z1159 Encounter for screening for other viral diseases: Secondary | ICD-10-CM | POA: Diagnosis not present

## 2023-09-28 DIAGNOSIS — Z1331 Encounter for screening for depression: Secondary | ICD-10-CM | POA: Diagnosis not present

## 2023-10-04 DIAGNOSIS — E113592 Type 2 diabetes mellitus with proliferative diabetic retinopathy without macular edema, left eye: Secondary | ICD-10-CM | POA: Diagnosis not present

## 2023-10-05 ENCOUNTER — Encounter: Admit: 2023-10-05 | Discharge: 2023-10-05 | Payer: MEDICARE

## 2023-10-27 DIAGNOSIS — E113592 Type 2 diabetes mellitus with proliferative diabetic retinopathy without macular edema, left eye: Secondary | ICD-10-CM | POA: Diagnosis not present

## 2023-11-27 ENCOUNTER — Encounter: Admit: 2023-11-27 | Discharge: 2023-11-27

## 2023-11-29 ENCOUNTER — Encounter: Admit: 2023-11-29 | Discharge: 2023-11-29

## 2023-12-01 DIAGNOSIS — E113592 Type 2 diabetes mellitus with proliferative diabetic retinopathy without macular edema, left eye: Secondary | ICD-10-CM | POA: Diagnosis not present

## 2023-12-05 ENCOUNTER — Encounter: Admit: 2023-12-05 | Discharge: 2023-12-05

## 2023-12-05 DIAGNOSIS — D3A098 Benign carcinoid tumors of other sites: Secondary | ICD-10-CM

## 2023-12-05 NOTE — Progress Notes
 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 male.    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   He has been under the care of Dr. Jorene Guest who is retired and hence he established with me on 01/13/2022.  I reviewed all his prior records since 2014.  His last scans in 2021 were excellent and we are only planning further scans if symptoms or lab changes.    He denies any abdominal pain nausea or vomiting.  No fevers chills or night sweats.  No loss of weight or loss of appetite.    He is accompanied by his wife.  They are very involved with their 5 grandchildren who live locally.    Interval History: No fever chills or night sweats.  No chest pain or shortness of breath.  No nausea vomiting.    Past Medical History:    Acid reflux    Arthritis    Back pain    Bowel obstruction (CMS-HCC)    Hearing reduced    Hyperlipemia    Irritable bowel disease    Other malignant neoplasm without specification of site    Small intestine cancer (CMS-HCC)    Stomach disorder     Surgical History:   Procedure Laterality Date    HX ENDOSCOPY  08/30/2011    colonoscopy/egd [facility]    HX BOWEL RESECTION  01/22/2013    small bowel    HX KNEE SURGERY Right 03/29/2014    arthroscopic   Dr. Bing Ree    COLONOSCOPY DIAGNOSTIC WITH SPECIMEN COLLECTION BY BRUSHING/ WASHING - FLEXIBLE N/A 04/23/2020    Performed by Barbie Banner, MD at [facility] OR    EYE SURGERY  5/23    Torn Retina    HX TONSILLECTOMY      as a child    HX VASECTOMY  10/22/1987    PROSTATE SURGERY  12/23/2022     Family History   Problem Relation Name Age of Onset    Alzheimer's Mother      Arthritis-rheumatoid Mother      Alzheimer's Father      Colon Polyps Father      Cancer-Lung Maternal Grandfather      Cancer Sister Gwen         Kidney    Arthritis-rheumatoid Mother      Cancer-Colon Neg Hx      Inflammatory Bowel Disease Neg Hx      Rectal Cancer Neg Hx      Ulcerative Colitis Neg Hx      GI Cancer Neg Hx       Social History     Socioeconomic History    Marital status: Married   Occupational History     Employer: [employer]   Tobacco Use    Smoking status: Never    Smokeless tobacco: Never   Substance and Sexual Activity    Alcohol use: Never    Drug use: Never    Sexual activity: Yes     Partners: Female     Birth control/protection: None                        Review of Systems   Constitutional: Negative.    HENT: Negative.     Eyes: Negative.  Respiratory: Negative.     Cardiovascular: Negative.    Gastrointestinal: Negative.    Genitourinary: Negative.    Musculoskeletal: Negative.    Skin: Negative.    Neurological: Negative.    Hematological: Negative.    Psychiatric/Behavioral: Negative.       Ventral hernia noted.  No hepatomegaly.    Objective:          acetaminophen (TYLENOL) 500 mg tablet Take two tablets by mouth every 6 hours as needed.    alfuzosin (UROXATRAL) 10 mg tablet     COQ10 (LIPOSOMAL UBIQUINOL) PO Take  by mouth.    famotidine (PEPCID) 20 mg tablet Take one tablet by mouth daily as needed.    IBUPROFEN (ADVIL PO) Take 200 mg by mouth. Patient takes three tabs at a time for a total dose of 600mg  as needed for pain    mirabegron (MYRBETRIQ) 50 mg ER tablet Take  by mouth daily.    peg-electrolyte solution (NULYTELY) 420 gram oral solution Mix as directed on package. Refrigerate once mixed. Do not mix greater than 24 hours prior to procedure. Drink 3/4 of bottle between 5pm and 7 pm the night before procedure. Drink remaining 1/4 of bottle 5 hours prior to procedure (Patient not taking: Reported on 09/06/2023)    polyethylene glycol 3350 (GLYCOLAX; MIRALAX) 17 gram/dose powder Take 17 g by mouth daily as needed (Constipation).    tamsulosin (FLOMAX) 0.4 mg capsule Take one capsule by mouth daily.    vitamins, multiple cap Take one capsule by mouth daily.     Vitals:    12/05/23 0900   BP: 131/78   BP Source: Arm, Right Upper   Pulse: 74   Temp: 36.2 ?C (97.2 ?F)   Resp: 18   SpO2: 99%   TempSrc: Skin   PainSc: Zero   Weight: 109 kg (240 lb 3.2 oz)       Body mass index is 35.58 kg/m?Marland Kitchen          Assessment and Plan:    1. Small-bowel carcinoid status post surgical resection Jan 22, 2013, with persistent abnormality and octreotide scan consistent with mesenteric mass/lymph node with resection by Dr. Rainey Pines in December, 2014, revealing carcinoid. His followup CT scans and octreotide scans have been normal.  He is asymptomatic. There was no evidence of disease on his  octreotide scan 12/31/2019.     CT scan of the chest abdomen pelvis on 01/25/2022 reveals no evidence of recurrent carcinoid tumor.  Prominent prostatomegaly.  Cholelithiasis.  Small ventral hernia containing nondilated/nonobstructed small bowel loop.  Mild thickening of the wall of the urinary bladder which could be related to cystitis versus chronic bladder outlet obstruction.    Labs from 04/10/2023 reveals elevated AST and ALT at 52 and 83 respectively.  CT chest abdomen pelvis om 05/08/23 is neg for metastasis.  Serotonin normal at 153 on 07/24/2023.    Chromogranin a level is 382 on 11/27/2023.  Serotonin level is pending.  No clinical symptoms of recurrent malignancy.    I will plan for follow-up CT chest abdomen pelvis in view of worsening chromogranin A level.    Plan for a telehealth visit after the CT scan to review the results.  If they are unremarkable, plan for labs including CBC, CMP, serotonin and chromogranin A level in 3 months.    2.  Elevated liver function test, new.  AST is 37 on 4 10/15/2021 and ALT 55.    CT scan of the chest abdomen pelvis  on 01/25/2022 reveals no evidence of recurrent carcinoid tumor.  Prominent prostatomegaly.  Cholelithiasis.  Small ventral hernia containing nondilated/nonobstructed small bowel loop.  Mild thickening of the wall of the urinary bladder which could be related to cystitis versus chronic bladder outlet obstruction.  Liver function test normalized on 07/24/2023.    3. He had a concerning skin lesion of the right temple region when Dr. Willette Pa saw him for his last checkup. He saw dermatologist Dr. Jena Gauss in [place] and this was a basal cell carcinoma which was resected.  He also had some other skin lesions evaluated.    4.  Ventral hernia: He has noticed more pain and discomfort lately.  He had discussed with Dr. Rainey Pines in the past regarding surgery.  He was referred to his general surgeon but later decided not to proceed with surgery.  She wants to see a Careers adviser at [facility].  I recommended to see Dr. Percell Miller.    CBC w diff      Latest Ref Rng & Units 11/27/2023     8:33 AM 07/24/2023     8:22 AM 04/10/2023     8:38 AM 04/12/2022     6:28 AM 12/13/2021    11:46 AM   CBC with Diff   WBC 3.8 - 10.8 Thousand/uL 8.2  7.4  8.5  7.9  7.3    RBC 4.20 - 5.80 Million/uL 5.53  5.46  5.55  5.26  5.60    Hemoglobin 13.2 - 17.1 g/dL 16.1  09.6  04.5  40.9  15.5    Hematocrit 38.5 - 50.0 % 47.5  47.6  48.0  45.0  46.6    MCV 80.0 - 100.0 fL 85.9  87.2  86.5  85.6  83.2    MCH 27.0 - 33.0 pg 28.2  28.8  28.6  28.7  27.7    MCHC 32.0 - 36.0 g/dL 81.1  91.4  78.2  95.6  33.3    RDW 11.0 - 15.0 % 13.0  13.3  12.9  13.3  13.2    Platelet Count 140 - 400 Thousand/uL 337  297  313  255  312    MPV 7.5 - 12.5 fL 9.8  9.9  10.0  9.6  9.5    Neurtrophils % 59.4  56.2  60.1  59.4  65.1    Absolute Neutrophils 1500 - 7800 cells/uL 4871  4159  5109  4693  4752    Absolute Lymph Count 850 - 3900 cells/uL 2214  2375  2414  2267  1745    Absolute Monocyte Count 200 - 950 cells/uL 812  651  740  751  584    Eosinophils % 3.1  2.4  2.2  2.0  2.3    Absolute Eosinophil Count 15 - 500 cells/uL 254  178  187  158  168    Basophils % 0.6  0.5  0.6  0.4  0.7      Comprehensive Metabolic Profile      Latest Ref Rng & Units 11/27/2023     8:33 AM 07/24/2023     8:22 AM 04/10/2023     8:38 AM 04/12/2022     6:28 AM 12/13/2021    11:46 AM CMP   Sodium 135 - 146 mmol/L 136  137  135  138  137    Potassium 3.5 - 5.3 mmol/L 4.4  4.4  4.5  4.2  4.5    Chloride 98 - 110 mmol/L 100  102  99  99  103    CO2 20 - 32 mmol/L 28  26  28  28  27     Blood Urea Nitrogen 7 - 25 mg/dL 9  12  11  10  10     Creatinine 0.70 - 1.35 mg/dL 1.61  0.96  0.45  4.09  0.69    Glucose 65 - 99 mg/dL 811  914  782  95  85    Calcium 8.6 - 10.3 mg/dL 9.4  9.6  9.9  9.4  9.8    Total Protein 6.1 - 8.1 g/dL 7.5  7.3  7.3  7.2  7.6    Albumin 3.6 - 5.1 g/dL 4.3  4.4  4.4  4.2  4.5    Alk Phosphatase 35 - 144 U/L 83  78  87  79  86    ALT (SGPT) 9 - 46 U/L 36  34  83  46  55    AST 10 - 35 U/L 29  26  52  33  37    Total Bilirubin 0.2 - 1.2 mg/dL 0.7  0.4  0.6  0.5  0.5    GFR > OR = 60 mL/min/1.13m2 96  91  94  96  102                                 Total Time Today was 40 minutes in the following activities: Preparing to see the patient, Obtaining and/or reviewing separately obtained history, Performing a medically appropriate examination and/or evaluation, Counseling and educating the patient/family/caregiver, Ordering medications, tests, or procedures, Referring and communication with other health care professionals (when not separately reported), Documenting clinical information in the electronic or other health record, and Care coordination (not separately reported)

## 2023-12-11 NOTE — Progress Notes
 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 male.    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   He has been under the care of Dr. Alyssa Jumper who is retired and hence he established with me on 01/13/2022.  I reviewed all his prior records since 2014.  His last scans in 2021 were excellent and we are only planning further scans if symptoms or lab changes.    He denies any abdominal pain nausea or vomiting.  No fevers chills or night sweats.  No loss of weight or loss of appetite.    He is accompanied by his wife.  They are very involved with their 5 grandchildren who live locally.    Interval History: No fever chills or night sweats.  No chest pain or shortness of breath.  No nausea vomiting.    Past Medical History:    Acid reflux    Arthritis    Back pain    Bowel obstruction (CMS-HCC)    Hearing reduced    Hyperlipemia    Irritable bowel disease    Other malignant neoplasm without specification of site    Small intestine cancer (CMS-HCC)    Stomach disorder     Surgical History:   Procedure Laterality Date    HX ENDOSCOPY  08/30/2011    colonoscopy/egd [facility]    HX BOWEL RESECTION  01/22/2013    small bowel    HX KNEE SURGERY Right 03/29/2014    arthroscopic   Dr. Ramsey Burows    COLONOSCOPY DIAGNOSTIC WITH SPECIMEN COLLECTION BY BRUSHING/ WASHING - FLEXIBLE N/A 04/23/2020    Performed by Hazel Listen, MD at [facility] OR    EYE SURGERY  5/23    Torn Retina    HX TONSILLECTOMY      as a child    HX VASECTOMY  10/22/1987    PROSTATE SURGERY  12/23/2022     Family History   Problem Relation Name Age of Onset    Alzheimer's Mother      Arthritis-rheumatoid Mother      Alzheimer's Father      Colon Polyps Father      Cancer-Lung Maternal Grandfather      Cancer Sister Gwen         Kidney    Arthritis-rheumatoid Mother      Cancer-Colon Neg Hx      Inflammatory Bowel Disease Neg Hx      Rectal Cancer Neg Hx      Ulcerative Colitis Neg Hx      GI Cancer Neg Hx       Social History     Socioeconomic History    Marital status: Married   Occupational History     Employer: [employer]   Tobacco Use    Smoking status: Never    Smokeless tobacco: Never   Substance and Sexual Activity    Alcohol use: Never    Drug use: Never    Sexual activity: Yes     Partners: Female     Birth control/protection: None                        Review of Systems   Constitutional: Negative.    HENT: Negative.     Eyes: Negative.  Respiratory: Negative.     Cardiovascular: Negative.    Gastrointestinal: Negative.    Genitourinary: Negative.    Musculoskeletal: Negative.    Skin: Negative.    Neurological: Negative.    Hematological: Negative.    Psychiatric/Behavioral: Negative.       Ventral hernia noted.  No hepatomegaly.    Objective:          acetaminophen (TYLENOL) 500 mg tablet Take two tablets by mouth every 6 hours as needed.    alfuzosin (UROXATRAL) 10 mg tablet     COQ10 (LIPOSOMAL UBIQUINOL) PO Take  by mouth.    famotidine (PEPCID) 20 mg tablet Take one tablet by mouth daily as needed.    IBUPROFEN (ADVIL PO) Take 200 mg by mouth. Patient takes three tabs at a time for a total dose of 600mg  as needed for pain    mirabegron (MYRBETRIQ) 50 mg ER tablet Take  by mouth daily.    peg-electrolyte solution (NULYTELY) 420 gram oral solution Mix as directed on package. Refrigerate once mixed. Do not mix greater than 24 hours prior to procedure. Drink 3/4 of bottle between 5pm and 7 pm the night before procedure. Drink remaining 1/4 of bottle 5 hours prior to procedure (Patient not taking: Reported on 09/06/2023)    polyethylene glycol 3350 (GLYCOLAX; MIRALAX) 17 gram/dose powder Take 17 g by mouth daily as needed (Constipation).    tamsulosin (FLOMAX) 0.4 mg capsule Take one capsule by mouth daily.    vitamins, multiple cap Take one capsule by mouth daily.     There were no vitals filed for this visit.      There is no height or weight on file to calculate BMI.          Assessment and Plan:    1. Small-bowel carcinoid status post surgical resection Jan 22, 2013, with persistent abnormality and octreotide scan consistent with mesenteric mass/lymph node with resection by Dr. Girtha Lama in December, 2014, revealing carcinoid. His followup CT scans and octreotide scans have been normal.  He is asymptomatic. There was no evidence of disease on his  octreotide scan 12/31/2019.     CT scan of the chest abdomen pelvis on 01/25/2022 reveals no evidence of recurrent carcinoid tumor.  Prominent prostatomegaly.  Cholelithiasis.  Small ventral hernia containing nondilated/nonobstructed small bowel loop.  Mild thickening of the wall of the urinary bladder which could be related to cystitis versus chronic bladder outlet obstruction.    Labs from 04/10/2023 reveals elevated AST and ALT at 52 and 83 respectively.  CT chest abdomen pelvis om 05/08/23 is neg for metastasis.  Serotonin normal at 153 on 07/24/2023.    Chromogranin a level is 382 on 11/27/2023.  Serotonin level is pending.  No clinical symptoms of recurrent malignancy.    I will plan for follow-up CT chest abdomen pelvis in view of worsening chromogranin A level.  CT 12/11/23 is neg for recurrent malignancy. Bladder calcification, cystoscopy recommended. Consult urology Dr Clora Dane at [place].    plan for labs including CBC, CMP, serotonin and chromogranin A level in 3 months.    2.  Elevated liver function test, new.  AST is 37 on 4 10/15/2021 and ALT 55.    CT scan of the chest abdomen pelvis on 01/25/2022 reveals no evidence of recurrent carcinoid tumor.  Prominent prostatomegaly.  Cholelithiasis.  Small ventral hernia containing nondilated/nonobstructed small bowel loop.  Mild thickening of the wall of the urinary bladder which could be related to cystitis versus chronic  bladder outlet obstruction.  Liver function test normalized on 07/24/2023.    3. He had a concerning skin lesion of the right temple region when Dr. Eber Goldsmith saw him for his last checkup. He saw dermatologist Dr. Eveleen Hinds in [place] and this was a basal cell carcinoma which was resected.  He also had some other skin lesions evaluated.    4.  Ventral hernia: He has noticed more pain and discomfort lately.  He had discussed with Dr. Girtha Lama in the past regarding surgery.  He was referred to his general surgeon but later decided not to proceed with surgery.  She wants to see a Careers adviser at [facility].  I recommended to see Dr. Oza.    CBC w diff      Latest Ref Rng & Units 11/27/2023     8:33 AM 07/24/2023     8:22 AM 04/10/2023     8:38 AM 04/12/2022     6:28 AM 12/13/2021    11:46 AM   CBC with Diff   WBC 3.8 - 10.8 Thousand/uL 8.2  7.4  8.5  7.9  7.3    RBC 4.20 - 5.80 Million/uL 5.53  5.46  5.55  5.26  5.60    Hemoglobin 13.2 - 17.1 g/dL 16.1  09.6  04.5  40.9  15.5    Hematocrit 38.5 - 50.0 % 47.5  47.6  48.0  45.0  46.6    MCV 80.0 - 100.0 fL 85.9  87.2  86.5  85.6  83.2    MCH 27.0 - 33.0 pg 28.2  28.8  28.6  28.7  27.7    MCHC 32.0 - 36.0 g/dL 81.1  91.4  78.2  95.6  33.3    RDW 11.0 - 15.0 % 13.0  13.3  12.9  13.3  13.2    Platelet Count 140 - 400 Thousand/uL 337  297  313  255  312    MPV 7.5 - 12.5 fL 9.8  9.9  10.0  9.6  9.5    Neurtrophils % 59.4  56.2  60.1  59.4  65.1    Absolute Neutrophils 1500 - 7800 cells/uL 4871  4159  5109  4693  4752    Absolute Lymph Count 850 - 3900 cells/uL 2214  2375  2414  2267  1745    Absolute Monocyte Count 200 - 950 cells/uL 812  651  740  751  584    Eosinophils % 3.1  2.4  2.2  2.0  2.3    Absolute Eosinophil Count 15 - 500 cells/uL 254  178  187  158  168    Basophils % 0.6  0.5  0.6  0.4  0.7      Comprehensive Metabolic Profile      Latest Ref Rng & Units 11/27/2023     8:33 AM 07/24/2023     8:22 AM 04/10/2023     8:38 AM 04/12/2022     6:28 AM 12/13/2021    11:46 AM   CMP   Sodium 135 - 146 mmol/L 136  137  135  138  137    Potassium 3.5 - 5.3 mmol/L 4.4  4.4  4.5  4.2  4.5    Chloride 98 - 110 mmol/L 100  102  99  99  103    CO2 20 - 32 mmol/L 28  26  28  28  27     Blood Urea Nitrogen 7 - 25 mg/dL 9  12  11  10  10     Creatinine 0.70 - 1.35 mg/dL 1.61  0.96  0.45  4.09  0.69    Glucose 65 - 99 mg/dL 811  914  782  95  85    Calcium 8.6 - 10.3 mg/dL 9.4  9.6  9.9  9.4  9.8    Total Protein 6.1 - 8.1 g/dL 7.5  7.3  7.3  7.2  7.6    Albumin 3.6 - 5.1 g/dL 4.3  4.4  4.4  4.2  4.5    Alk Phosphatase 35 - 144 U/L 83  78  87  79  86    ALT (SGPT) 9 - 46 U/L 36  34  83  46  55    AST 10 - 35 U/L 29  26  52  33  37    Total Bilirubin 0.2 - 1.2 mg/dL 0.7  0.4  0.6  0.5  0.5    GFR > OR = 60 mL/min/1.62m2 96  91  94  96  102                                 Total Time Today was 40 minutes in the following activities: Preparing to see the patient, Obtaining and/or reviewing separately obtained history, Performing a medically appropriate examination and/or evaluation, Counseling and educating the patient/family/caregiver, Ordering medications, tests, or procedures, Referring and communication with other health care professionals (when not separately reported), Documenting clinical information in the electronic or other health record, and Care coordination (not separately reported)

## 2023-12-13 ENCOUNTER — Encounter: Admit: 2023-12-13 | Discharge: 2023-12-13 | Payer: MEDICARE

## 2023-12-13 DIAGNOSIS — R7989 Other specified abnormal findings of blood chemistry: Secondary | ICD-10-CM

## 2023-12-13 DIAGNOSIS — D3A098 Benign carcinoid tumors of other sites: Secondary | ICD-10-CM

## 2024-01-12 DIAGNOSIS — E113592 Type 2 diabetes mellitus with proliferative diabetic retinopathy without macular edema, left eye: Secondary | ICD-10-CM | POA: Diagnosis not present

## 2024-01-25 DIAGNOSIS — E1121 Type 2 diabetes mellitus with diabetic nephropathy: Secondary | ICD-10-CM | POA: Diagnosis not present

## 2024-01-25 DIAGNOSIS — E7800 Pure hypercholesterolemia, unspecified: Secondary | ICD-10-CM | POA: Diagnosis not present

## 2024-01-25 DIAGNOSIS — I10 Essential (primary) hypertension: Secondary | ICD-10-CM | POA: Diagnosis not present

## 2024-01-25 DIAGNOSIS — I5020 Unspecified systolic (congestive) heart failure: Secondary | ICD-10-CM | POA: Diagnosis not present

## 2024-02-09 DIAGNOSIS — Z9849 Cataract extraction status, unspecified eye: Secondary | ICD-10-CM | POA: Diagnosis not present

## 2024-02-09 DIAGNOSIS — I700 Atherosclerosis of aorta: Secondary | ICD-10-CM | POA: Diagnosis not present

## 2024-03-05 ENCOUNTER — Encounter: Admit: 2024-03-05 | Discharge: 2024-03-05 | Payer: MEDICARE

## 2024-03-13 ENCOUNTER — Encounter: Admit: 2024-03-13 | Discharge: 2024-03-13 | Payer: MEDICARE

## 2024-03-13 DIAGNOSIS — R7989 Other specified abnormal findings of blood chemistry: Secondary | ICD-10-CM

## 2024-03-13 DIAGNOSIS — D3A098 Benign carcinoid tumors of other sites: Principal | ICD-10-CM

## 2024-03-13 NOTE — Progress Notes
 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 male.    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   He has been under the care of Dr. Deland Fujisawa who is retired and hence he established with me on 01/13/2022.  I reviewed all his prior records since 2014.  His last scans in 2021 were excellent and we are only planning further scans if symptoms or lab changes.    He denies any abdominal pain nausea or vomiting.  No fevers chills or night sweats.  No loss of weight or loss of appetite.    He is accompanied by his wife.  They are very involved with their 5 grandchildren who live locally.    Interval History: No fever chills or night sweats.  No chest pain or shortness of breath.  No nausea vomiting.    Past Medical History:    Acid reflux    Arthritis    Back pain    Bowel obstruction (CMS-HCC)    Hearing reduced    Hyperlipemia    Irritable bowel disease    Other malignant neoplasm without specification of site    Small intestine cancer (CMS-HCC)    Stomach disorder     Surgical History:   Procedure Laterality Date    HX ENDOSCOPY  08/30/2011    colonoscopy/[facility]d [facility]    HX BOWEL RESECTION  01/22/2013    small bowel    HX KNEE SURGERY Right 03/29/2014    arthroscopic   Dr. Sammi    COLONOSCOPY DIAGNOSTIC WITH SPECIMEN COLLECTION BY BRUSHING/ WASHING - FLEXIBLE N/A 04/23/2020    Performed by Conley Glendia ORN, MD at [facility] OR    EYE SURGERY  5/23    Torn Retina    HX TONSILLECTOMY      as a child    HX VASECTOMY  10/22/1987    PROSTATE SURGERY  12/23/2022     Family History   Problem Relation Name Age of Onset    Alzheimer's Mother      Arthritis-rheumatoid Mother      Alzheimer's Father      Colon Polyps Father      Cancer-Lung Maternal Grandfather      Cancer Sister Gwen         Kidney    Arthritis-rheumatoid Mother      Cancer-Colon Neg Hx      Inflammatory Bowel Disease Neg Hx      Rectal Cancer Neg Hx      Ulcerative Colitis Neg Hx      GI Cancer Neg Hx       Social History     Socioeconomic History    Marital status: Married   Occupational History     Employer: [employer]   Tobacco Use    Smoking status: Never    Smokeless tobacco: Never   Substance and Sexual Activity    Alcohol use: Never    Drug use: Never    Sexual activity: Yes     Partners: Female     Birth control/protection: None                        Review of Systems   Constitutional: Negative.    HENT: Negative.     Eyes: Negative.  Respiratory: Negative.     Cardiovascular: Negative.    Gastrointestinal: Negative.    Genitourinary: Negative.    Musculoskeletal: Negative.    Skin: Negative.    Neurological: Negative.    Hematological: Negative.    Psychiatric/Behavioral: Negative.       Ventral hernia noted.  No hepatomegaly.    Objective:          acetaminophen (TYLENOL) 500 mg tablet Take two tablets by mouth every 6 hours as needed.    alfuzosin (UROXATRAL) 10 mg tablet     COQ10 (LIPOSOMAL UBIQUINOL) PO Take  by mouth.    famotidine (PEPCID) 20 mg tablet Take one tablet by mouth daily as needed.    IBUPROFEN (ADVIL PO) Take 200 mg by mouth. Patient takes three tabs at a time for a total dose of 600mg  as needed for pain    mirabegron (MYRBETRIQ) 50 mg ER tablet Take  by mouth daily.    peg-electrolyte solution (NULYTELY) 420 gram oral solution Mix as directed on package. Refrigerate once mixed. Do not mix greater than 24 hours prior to procedure. Drink 3/4 of bottle between 5pm and 7 pm the night before procedure. Drink remaining 1/4 of bottle 5 hours prior to procedure (Patient not taking: Reported on 09/06/2023)    polyethylene glycol 3350 (GLYCOLAX; MIRALAX) 17 gram/dose powder Take 17 g by mouth daily as needed (Constipation).    tamsulosin (FLOMAX) 0.4 mg capsule Take one capsule by mouth daily.    vitamins, multiple cap Take one capsule by mouth daily.     There were no vitals filed for this visit.      There is no height or weight on file to calculate BMI.          Assessment and Plan:    1. Small-bowel carcinoid status post surgical resection Jan 22, 2013, with persistent abnormality and octreotide scan consistent with mesenteric mass/lymph node with resection by Dr. Rexene in December, 2014, revealing carcinoid. His followup CT scans and octreotide scans have been normal.  He is asymptomatic. There was no evidence of disease on his  octreotide scan 12/31/2019.     CT scan of the chest abdomen pelvis on 01/25/2022 reveals no evidence of recurrent carcinoid tumor.  Prominent prostatomegaly.  Cholelithiasis.  Small ventral hernia containing nondilated/nonobstructed small bowel loop.  Mild thickening of the wall of the urinary bladder which could be related to cystitis versus chronic bladder outlet obstruction.    Labs from 04/10/2023 reveals elevated AST and ALT at 52 and 83 respectively.  CT chest abdomen pelvis om 05/08/23 is neg for metastasis.  Serotonin normal at 153 on 07/24/2023.    Chromogranin a level is 382 on 11/27/2023 and 242 on 03/05/24.  Serotonin level is 143.  No clinical symptoms of recurrent malignancy.    CT 12/11/23 is neg for recurrent malignancy. Bladder calcification, cystoscopy recommended. Consulted urology Dr Katherin at [place], no interventions needed.    no clinical evidence of recurrence.  plan for labs including CBC, CMP, serotonin and chromogranin A level in 3 months and office visit, mail orders to pt as [facility].    2.  Elevated liver function test, new.  AST is 37 on 4 10/15/2021 and ALT 55.    CT scan of the chest abdomen pelvis on 01/25/2022 reveals no evidence of recurrent carcinoid tumor.  Prominent prostatomegaly.  Cholelithiasis.  Small ventral hernia containing nondilated/nonobstructed small bowel loop.  Mild thickening of the wall of  the urinary bladder which could be related to cystitis versus chronic bladder outlet obstruction.  Liver function test normalized on 07/24/2023.    3. He had a concerning skin lesion of the right temple region when Dr. Jackee saw him for his last checkup. He saw dermatologist Dr. Bryan in [place] and this was a basal cell carcinoma which was resected.  He also had some other skin lesions evaluated.    4.  Ventral hernia: He has noticed more pain and discomfort lately.  He had discussed with Dr. Rexene in the past regarding surgery.  He was referred to his general surgeon but later decided not to proceed with surgery.  She wants to see a Careers adviser at [facility].  I recommended to see Dr. Oza.    CBC w diff      Latest Ref Rng & Units 03/05/2024     7:37 AM 11/27/2023     8:33 AM 07/24/2023     8:22 AM 04/10/2023     8:38 AM 04/12/2022     6:28 AM   CBC with Diff   WBC 3.8 - 10.8 Thousand/uL 8.2  8.2  7.4  8.5  7.9    RBC 4.20 - 5.80 Million/uL 5.58  5.53  5.46  5.55  5.26    Hemoglobin 13.2 - 17.1 g/dL 84.2  84.3  84.2  84.0  15.1    Hematocrit 38.5 - 50.0 % 48.6  47.5  47.6  48.0  45.0    MCV 80.0 - 100.0 fL 87.1  85.9  87.2  86.5  85.6    MCH 27.0 - 33.0 pg 28.1  28.2  28.8  28.6  28.7    MCHC 32.0 - 36.0 g/dL 67.6  67.1  66.9  66.8  33.6    RDW 11.0 - 15.0 % 13.2  13.0  13.3  12.9  13.3    Platelet Count 140 - 400 Thousand/uL 288  337  297  313  255    MPV 7.5 - 12.5 fL 9.4  9.8  9.9  10.0  9.6    Neurtrophils % 59.8  59.4  56.2  60.1  59.4    Absolute Neutrophils 1500 - 7800 cells/uL 4904  4871  4159  5109  4693    Absolute Lymph Count 850 - 3900 cells/uL 2304  2214  2375  2414  2267    Absolute Monocyte Count 200 - 950 cells/uL 754  812  651  740  751    Eosinophils % 2.4  3.1  2.4  2.2  2.0    Absolute Eosinophil Count 15 - 500 cells/uL 197  254  178  187  158    Basophils % 0.5  0.6  0.5  0.6  0.4      Comprehensive Metabolic Profile      Latest Ref Rng & Units 03/05/2024     7:37 AM 11/27/2023     8:33 AM 07/24/2023     8:22 AM 04/10/2023     8:38 AM 04/12/2022     6:28 AM   CMP   Sodium 135 - 146 mmol/L 137  136  137  135  138    Potassium 3.5 - 5.3 mmol/L 4.3  4.4  4.4  4.5  4.2    Chloride 98 - 110 mmol/L 101  100  102  99  99    CO2 20 - 32 mmol/L 28  28  26  28   28  Blood Urea Nitrogen 7 - 25 mg/dL 13  9  12  11  10     Creatinine 0.70 - 1.35 mg/dL 9.13  9.17  9.07  9.10  0.85    Glucose 65 - 99 mg/dL 892  893  892  887  95    Calcium 8.6 - 10.3 mg/dL 9.6  9.4  9.6  9.9  9.4    Total Protein 6.1 - 8.1 g/dL 7.7  7.5  7.3  7.3  7.2    Albumin 3.6 - 5.1 g/dL 4.4  4.3  4.4  4.4  4.2    Alk Phosphatase 35 - 144 U/L 79  83  78  87  79    ALT (SGPT) 9 - 46 U/L 34  36  34  83  46    AST 10 - 35 U/L 34  29  26  52  33    Total Bilirubin 0.2 - 1.2 mg/dL 0.6  0.7  0.4  0.6  0.5    GFR > OR = 60 mL/min/1.29m2 94  96  91  94  96                                 Total Time Today was 25 minutes in the following activities: Preparing to see the patient, Obtaining and/or reviewing separately obtained history, Performing a medically appropriate examination and/or evaluation, Counseling and educating the patient/family/caregiver, Ordering medications, tests, or procedures, Referring and communication with other health care professionals (when not separately reported), Documenting clinical information in the electronic or other health record, and Care coordination (not separately reported)

## 2024-04-19 DIAGNOSIS — E113592 Type 2 diabetes mellitus with proliferative diabetic retinopathy without macular edema, left eye: Secondary | ICD-10-CM | POA: Diagnosis not present

## 2024-05-10 DIAGNOSIS — H40052 Ocular hypertension, left eye: Secondary | ICD-10-CM | POA: Diagnosis not present

## 2024-05-22 DIAGNOSIS — I10 Essential (primary) hypertension: Secondary | ICD-10-CM | POA: Diagnosis not present

## 2024-05-22 DIAGNOSIS — I5020 Unspecified systolic (congestive) heart failure: Secondary | ICD-10-CM | POA: Diagnosis not present

## 2024-05-22 DIAGNOSIS — E7800 Pure hypercholesterolemia, unspecified: Secondary | ICD-10-CM | POA: Diagnosis not present

## 2024-05-27 ENCOUNTER — Encounter: Admit: 2024-05-27 | Discharge: 2024-05-27 | Payer: MEDICARE

## 2024-06-06 ENCOUNTER — Encounter: Admit: 2024-06-06 | Discharge: 2024-06-06 | Payer: MEDICARE

## 2024-06-11 ENCOUNTER — Encounter: Admit: 2024-06-11 | Discharge: 2024-06-11 | Payer: MEDICARE

## 2024-06-11 VITALS — BP 134/68 | HR 70 | Temp 97.40000°F | Resp 18 | Ht 69.409 in | Wt 237.0 lb

## 2024-06-11 DIAGNOSIS — D3A098 Benign carcinoid tumors of other sites: Principal | ICD-10-CM

## 2024-06-11 NOTE — Progress Notes
 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 male.    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   He has been under the care of Dr. Deland Fujisawa who is retired and hence he established with me on 01/13/2022.  I reviewed all his prior records since 2014.  His last scans in 2021 were excellent and we are only planning further scans if symptoms or lab changes.    He denies any abdominal pain nausea or vomiting.  No fevers chills or night sweats.  No loss of weight or loss of appetite.    He is accompanied by his wife.  They are very involved with their 5 grandchildren who live locally.    Interval History: No fever chills or night sweats.  No chest pain or shortness of breath.  No nausea vomiting.    Past Medical History:    Acid reflux    Arthritis    Back pain    Bowel obstruction (CMS-HCC)    Hearing reduced    Hyperlipemia    Irritable bowel disease    Other malignant neoplasm without specification of site    Small intestine cancer (CMS-HCC)    Stomach disorder     Surgical History:   Procedure Laterality Date    HX ENDOSCOPY  08/30/2011    colonoscopy/egd [facility]    HX BOWEL RESECTION  01/22/2013    small bowel    HX KNEE SURGERY Right 03/29/2014    arthroscopic   Dr. Sammi    COLONOSCOPY DIAGNOSTIC WITH SPECIMEN COLLECTION BY BRUSHING/ WASHING - FLEXIBLE N/A 04/23/2020    Performed by Conley Glendia ORN, MD at [facility] OR    EYE SURGERY  5/23    Torn Retina    HX TONSILLECTOMY      as a child    HX VASECTOMY  10/22/1987    PROSTATE SURGERY  12/23/2022     Family History   Problem Relation Name Age of Onset    Alzheimer's Mother      Arthritis-rheumatoid Mother      Alzheimer's Father      Colon Polyps Father      Cancer-Lung Maternal Grandfather      Cancer Sister Gwen         Kidney    Arthritis-rheumatoid Mother      Cancer-Colon Neg Hx      Inflammatory Bowel Disease Neg Hx      Rectal Cancer Neg Hx      Ulcerative Colitis Neg Hx      GI Cancer Neg Hx       Social History     Socioeconomic History    Marital status: Married   Occupational History     Employer: [employer]   Tobacco Use    Smoking status: Never    Smokeless tobacco: Never   Substance and Sexual Activity    Alcohol use: Never    Drug use: Never    Sexual activity: Yes     Partners: Female     Birth control/protection: None                        Review of Systems   Constitutional: Negative.    HENT: Negative.     Eyes: Negative.  Respiratory: Negative.     Cardiovascular: Negative.    Gastrointestinal: Negative.    Genitourinary: Negative.    Musculoskeletal: Negative.    Skin: Negative.    Neurological: Negative.    Hematological: Negative.    Psychiatric/Behavioral: Negative.       Ventral hernia noted.  No hepatomegaly.    Objective:          acetaminophen (TYLENOL) 500 mg tablet Take two tablets by mouth every 6 hours as needed.    alfuzosin (UROXATRAL) 10 mg tablet     COQ10 (LIPOSOMAL UBIQUINOL) PO Take  by mouth.    famotidine (PEPCID) 20 mg tablet Take one tablet by mouth daily as needed.    IBUPROFEN (ADVIL PO) Take 200 mg by mouth. Patient takes three tabs at a time for a total dose of 600mg  as needed for pain    mirabegron (MYRBETRIQ) 50 mg ER tablet Take  by mouth daily.    peg-electrolyte solution (NULYTELY) 420 gram oral solution Mix as directed on package. Refrigerate once mixed. Do not mix greater than 24 hours prior to procedure. Drink 3/4 of bottle between 5pm and 7 pm the night before procedure. Drink remaining 1/4 of bottle 5 hours prior to procedure (Patient not taking: Reported on 06/11/2024)    polyethylene glycol 3350 (GLYCOLAX; MIRALAX) 17 gram/dose powder Take 17 g by mouth daily as needed (Constipation).    tamsulosin (FLOMAX) 0.4 mg capsule Take one capsule by mouth daily. (Patient not taking: Reported on 06/11/2024)    vitamins, multiple cap Take one capsule by mouth daily. Vitals:    06/11/24 0848   BP: 134/68   BP Source: Arm, Left Upper   Pulse: 70   Temp: 36.3 ?C (97.4 ?F)   Resp: 18   SpO2: 98%   TempSrc: Temporal   PainSc: Zero   Weight: 107.5 kg (237 lb)   Height: 176.3 cm (5' 9.41)         Body mass index is 34.59 kg/m?SABRA          Assessment and Plan:    1. Small-bowel carcinoid status post surgical resection Jan 22, 2013, with persistent abnormality and octreotide scan consistent with mesenteric mass/lymph node with resection by Dr. Rexene in December, 2014, revealing carcinoid. His followup CT scans and octreotide scans have been normal.  He is asymptomatic. There was no evidence of disease on his  octreotide scan 12/31/2019.     CT scan of the chest abdomen pelvis on 01/25/2022 reveals no evidence of recurrent carcinoid tumor.  Prominent prostatomegaly.  Cholelithiasis.  Small ventral hernia containing nondilated/nonobstructed small bowel loop.  Mild thickening of the wall of the urinary bladder which could be related to cystitis versus chronic bladder outlet obstruction.    Labs from 04/10/2023 reveals elevated AST and ALT at 52 and 83 respectively.  CT chest abdomen pelvis om 05/08/23 is neg for metastasis.  Serotonin normal at 153 on 07/24/2023.    Chromogranin a level is 382 on 11/27/2023 and 242 on 03/05/24.  Serotonin level is 143.  No clinical symptoms of recurrent malignancy.  Chromogranin a level is at 314 on 05/27/2024.  Serotonin 194.  CBC CMP is unremarkable.    CT 12/11/23 is neg for recurrent malignancy. Bladder calcification, cystoscopy recommended. Consulted urology Dr Katherin at [place], no interventions needed.    no clinical evidence of recurrence.  plan for labs including CBC, CMP, serotonin and chromogranin A level in 6 months and tele visit, [facility] wants to get labs  [facility].  I will also obtain the annual scan which will be due in April 2026.    2.  Elevated liver function test, new.  AST is 37 on 4 10/15/2021 and ALT 55.    CT scan of the chest abdomen pelvis on 01/25/2022 reveals no evidence of recurrent carcinoid tumor.  Prominent prostatomegaly.  Cholelithiasis.  Small ventral hernia containing nondilated/nonobstructed small bowel loop.  Mild thickening of the wall of the urinary bladder which could be related to cystitis versus chronic bladder outlet obstruction.  Liver function test normalized on 07/24/2023.    3. He had a concerning skin lesion of the right temple region when Dr. Jackee saw him for his last checkup. He saw dermatologist Dr. Bryan in [place] and this was a basal cell carcinoma which was resected.  He also had some other skin lesions evaluated.    4.  Ventral hernia: He has noticed more pain and discomfort lately.  He had discussed with Dr. Rexene in the past regarding surgery.  He was referred to his general surgeon but later decided not to proceed with surgery.  She wants to see a Careers adviser at [facility].  I recommended to see Dr. Oza.    CBC w diff      Latest Ref Rng & Units 05/27/2024     7:44 AM 03/05/2024     7:37 AM 11/27/2023     8:33 AM 07/24/2023     8:22 AM 04/10/2023     8:38 AM   CBC with Diff   WBC 3.8 - 10.8 Thousand/uL 8.2  8.2  8.2  7.4  8.5    RBC 4.20 - 5.80 Million/uL 5.26  5.58  5.53  5.46  5.55    Hemoglobin 13.2 - 17.1 g/dL 84.8  84.2  84.3  84.2  15.9    Hematocrit 38.5 - 50.0 % 46.6  48.6  47.5  47.6  48.0    MCV 80.0 - 100.0 fL 88.6  87.1  85.9  87.2  86.5    MCH 27.0 - 33.0 pg 28.7  28.1  28.2  28.8  28.6    MCHC 32.0 - 36.0 g/dL 67.5  67.6  67.1  66.9  33.1    RDW 11.0 - 15.0 % 13.2  13.2  13.0  13.3  12.9    Platelet Count 140 - 400 Thousand/uL 297  288  337  297  313    MPV 7.5 - 12.5 fL 9.4  9.4  9.8  9.9  10.0    Neurtrophils % 59.1  59.8  59.4  56.2  60.1    Absolute Neutrophils 1500 - 7800 cells/uL 4846  4904  4871  4159  5109    Absolute Lymph Count 850 - 3900 cells/uL 2304  2304  2214  2375  2414    Absolute Monocyte Count 200 - 950 cells/uL 746  754  812  651  740    Eosinophils % 3.2 2.4  3.1  2.4  2.2    Absolute Eosinophil Count 15 - 500 cells/uL 262  197  254  178  187    Basophils % 0.5  0.5  0.6  0.5  0.6      Comprehensive Metabolic Profile      Latest Ref Rng & Units 05/27/2024     7:44 AM 03/05/2024     7:37 AM 11/27/2023     8:33 AM 07/24/2023     8:22 AM 04/10/2023  8:38 AM   CMP   Sodium 135 - 146 mmol/L 136  137  136  137  135    Potassium 3.5 - 5.3 mmol/L 4.4  4.3  4.4  4.4  4.5    Chloride 98 - 110 mmol/L 102  101  100  102  99    CO2 20 - 32 mmol/L 29  28  28  26  28     Blood Urea Nitrogen 7 - 25 mg/dL 12  13  9  12  11     Creatinine 0.70 - 1.35 mg/dL 9.17  9.13  9.17  9.07  0.89    Glucose 65 - 99 mg/dL 898  892  893  892  887    Calcium 8.6 - 10.3 mg/dL 9.5  9.6  9.4  9.6  9.9    Total Protein 6.1 - 8.1 g/dL 7.2  7.7  7.5  7.3  7.3    Albumin 3.6 - 5.1 g/dL 4.2  4.4  4.3  4.4  4.4    Alk Phosphatase 35 - 144 U/L 83  79  83  78  87    ALT (SGPT) 9 - 46 U/L 29  34  36  34  83    AST 10 - 35 U/L 28  34  29  26  52    Total Bilirubin 0.2 - 1.2 mg/dL 0.4  0.6  0.7  0.4  0.6    GFR > OR = 60 mL/min/1.81m2 96  94  96  91  94                                 Total Time Today was 30 minutes in the following activities: Preparing to see the patient, Obtaining and/or reviewing separately obtained history, Performing a medically appropriate examination and/or evaluation, Counseling and educating the patient/family/caregiver, Ordering medications, tests, or procedures, Referring and communication with other health care professionals (when not separately reported), Documenting clinical information in the electronic or other health record, and Care coordination (not separately reported)

## 7363-11-28 DEATH — deceased
